# Patient Record
Sex: Male | Born: 1955
Health system: Southern US, Community
[De-identification: ages and names within clinical notes are randomized; demographics above are authoritative.]

## PROBLEM LIST (undated history)

## (undated) DIAGNOSIS — F17200 Nicotine dependence, unspecified, uncomplicated: Secondary | ICD-10-CM

## (undated) DIAGNOSIS — G8929 Other chronic pain: Secondary | ICD-10-CM

## (undated) DIAGNOSIS — I10 Essential (primary) hypertension: Secondary | ICD-10-CM

## (undated) DIAGNOSIS — J449 Chronic obstructive pulmonary disease, unspecified: Secondary | ICD-10-CM

## (undated) DIAGNOSIS — M549 Dorsalgia, unspecified: Secondary | ICD-10-CM

## (undated) HISTORY — DX: Chronic obstructive pulmonary disease, unspecified: J44.9

## (undated) HISTORY — PX: UVULOPALATOPHARYNGOPLASTY: SHX827

## (undated) HISTORY — DX: Nicotine dependence, unspecified, uncomplicated: F17.200

## (undated) HISTORY — DX: Essential (primary) hypertension: I10

## (undated) HISTORY — DX: Other chronic pain: G89.29

## (undated) HISTORY — DX: Dorsalgia, unspecified: M54.9

---

## 1997-08-10 ENCOUNTER — Other Ambulatory Visit: Admission: RE | Admit: 1997-08-10 | Discharge: 1997-08-10 | Payer: Self-pay | Admitting: Family Medicine

## 1997-10-06 ENCOUNTER — Other Ambulatory Visit: Admission: RE | Admit: 1997-10-06 | Discharge: 1997-10-06 | Payer: Self-pay | Admitting: Family Medicine

## 1997-10-31 ENCOUNTER — Ambulatory Visit: Admission: RE | Admit: 1997-10-31 | Discharge: 1997-10-31 | Payer: Self-pay | Admitting: Internal Medicine

## 1999-03-05 ENCOUNTER — Encounter: Payer: Self-pay | Admitting: Otolaryngology

## 1999-03-05 ENCOUNTER — Ambulatory Visit: Admission: RE | Admit: 1999-03-05 | Discharge: 1999-03-05 | Payer: Self-pay | Admitting: Otolaryngology

## 1999-03-20 ENCOUNTER — Ambulatory Visit (HOSPITAL_COMMUNITY): Admission: RE | Admit: 1999-03-20 | Discharge: 1999-03-21 | Payer: Self-pay | Admitting: Otolaryngology

## 1999-03-20 ENCOUNTER — Encounter (INDEPENDENT_AMBULATORY_CARE_PROVIDER_SITE_OTHER): Payer: Self-pay | Admitting: *Deleted

## 2003-03-13 ENCOUNTER — Observation Stay (HOSPITAL_COMMUNITY): Admission: AD | Admit: 2003-03-13 | Discharge: 2003-03-14 | Payer: Self-pay | Admitting: Orthopedic Surgery

## 2004-01-04 ENCOUNTER — Ambulatory Visit (HOSPITAL_COMMUNITY): Admission: RE | Admit: 2004-01-04 | Discharge: 2004-01-04 | Payer: Self-pay | Admitting: Family Medicine

## 2004-06-14 ENCOUNTER — Encounter: Admission: RE | Admit: 2004-06-14 | Discharge: 2004-06-14 | Payer: Self-pay | Admitting: Neurology

## 2004-10-25 ENCOUNTER — Encounter: Admission: RE | Admit: 2004-10-25 | Discharge: 2004-10-25 | Payer: Self-pay | Admitting: Family Medicine

## 2004-10-31 ENCOUNTER — Ambulatory Visit (HOSPITAL_COMMUNITY): Admission: RE | Admit: 2004-10-31 | Discharge: 2004-11-01 | Payer: Self-pay | Admitting: Orthopaedic Surgery

## 2006-02-16 ENCOUNTER — Ambulatory Visit: Payer: Self-pay | Admitting: Nurse Practitioner

## 2006-02-17 ENCOUNTER — Ambulatory Visit: Payer: Self-pay | Admitting: *Deleted

## 2006-03-02 ENCOUNTER — Ambulatory Visit: Payer: Self-pay | Admitting: Family Medicine

## 2006-03-04 ENCOUNTER — Ambulatory Visit: Payer: Self-pay | Admitting: Nurse Practitioner

## 2006-04-14 ENCOUNTER — Ambulatory Visit: Payer: Self-pay | Admitting: Nurse Practitioner

## 2006-04-21 ENCOUNTER — Ambulatory Visit: Payer: Self-pay | Admitting: Nurse Practitioner

## 2006-05-21 ENCOUNTER — Ambulatory Visit: Payer: Self-pay | Admitting: Nurse Practitioner

## 2006-12-23 ENCOUNTER — Encounter (INDEPENDENT_AMBULATORY_CARE_PROVIDER_SITE_OTHER): Payer: Self-pay | Admitting: *Deleted

## 2007-07-01 ENCOUNTER — Ambulatory Visit: Payer: Self-pay | Admitting: Internal Medicine

## 2007-07-14 ENCOUNTER — Ambulatory Visit: Payer: Self-pay | Admitting: Internal Medicine

## 2007-08-10 ENCOUNTER — Ambulatory Visit: Payer: Self-pay | Admitting: Internal Medicine

## 2007-09-18 ENCOUNTER — Emergency Department (HOSPITAL_COMMUNITY): Admission: EM | Admit: 2007-09-18 | Discharge: 2007-09-19 | Payer: Self-pay | Admitting: Emergency Medicine

## 2007-09-21 ENCOUNTER — Encounter (INDEPENDENT_AMBULATORY_CARE_PROVIDER_SITE_OTHER): Payer: Self-pay | Admitting: Nurse Practitioner

## 2007-09-21 ENCOUNTER — Ambulatory Visit: Payer: Self-pay | Admitting: Internal Medicine

## 2007-09-21 LAB — CONVERTED CEMR LAB
Albumin: 4.6 g/dL (ref 3.5–5.2)
BUN: 9 mg/dL (ref 6–23)
Bilirubin, Direct: 0.1 mg/dL (ref 0.0–0.3)
Chloride: 101 meq/L (ref 96–112)
Direct LDL: 97 mg/dL
Glucose, Bld: 408 mg/dL — ABNORMAL HIGH (ref 70–99)
Potassium: 3.9 meq/L (ref 3.5–5.3)
Sodium: 137 meq/L (ref 135–145)
Total Protein: 7.5 g/dL (ref 6.0–8.3)

## 2007-10-18 ENCOUNTER — Ambulatory Visit: Payer: Self-pay | Admitting: Internal Medicine

## 2007-11-23 ENCOUNTER — Encounter (INDEPENDENT_AMBULATORY_CARE_PROVIDER_SITE_OTHER): Payer: Self-pay | Admitting: Family Medicine

## 2007-11-23 ENCOUNTER — Ambulatory Visit: Payer: Self-pay | Admitting: Internal Medicine

## 2007-11-23 LAB — CONVERTED CEMR LAB
ALT: 16 units/L (ref 0–53)
AST: 14 units/L (ref 0–37)
Albumin: 4.7 g/dL (ref 3.5–5.2)
BUN: 15 mg/dL (ref 6–23)
CO2: 22 meq/L (ref 19–32)
Chloride: 101 meq/L (ref 96–112)
Cholesterol: 168 mg/dL (ref 0–200)
Glucose, Bld: 130 mg/dL — ABNORMAL HIGH (ref 70–99)
LDL Cholesterol: 91 mg/dL (ref 0–99)
Microalb, Ur: 8.6 mg/dL — ABNORMAL HIGH (ref 0.00–1.89)
Sodium: 137 meq/L (ref 135–145)
Total Bilirubin: 0.5 mg/dL (ref 0.3–1.2)
Total CHOL/HDL Ratio: 5.1
Total Protein: 7.6 g/dL (ref 6.0–8.3)
VLDL: 44 mg/dL — ABNORMAL HIGH (ref 0–40)

## 2007-12-05 ENCOUNTER — Emergency Department (HOSPITAL_COMMUNITY): Admission: EM | Admit: 2007-12-05 | Discharge: 2007-12-05 | Payer: Self-pay | Admitting: *Deleted

## 2007-12-10 ENCOUNTER — Ambulatory Visit: Payer: Self-pay | Admitting: Internal Medicine

## 2007-12-20 ENCOUNTER — Ambulatory Visit: Payer: Self-pay | Admitting: Internal Medicine

## 2007-12-31 ENCOUNTER — Emergency Department (HOSPITAL_COMMUNITY): Admission: EM | Admit: 2007-12-31 | Discharge: 2007-12-31 | Payer: Self-pay | Admitting: Emergency Medicine

## 2008-01-08 ENCOUNTER — Emergency Department (HOSPITAL_COMMUNITY): Admission: EM | Admit: 2008-01-08 | Discharge: 2008-01-08 | Payer: Self-pay | Admitting: Emergency Medicine

## 2008-01-10 ENCOUNTER — Ambulatory Visit: Payer: Self-pay | Admitting: Internal Medicine

## 2008-01-11 ENCOUNTER — Ambulatory Visit (HOSPITAL_COMMUNITY): Admission: RE | Admit: 2008-01-11 | Discharge: 2008-01-11 | Payer: Self-pay | Admitting: Internal Medicine

## 2008-01-27 ENCOUNTER — Ambulatory Visit (HOSPITAL_COMMUNITY): Admission: RE | Admit: 2008-01-27 | Discharge: 2008-01-27 | Payer: Self-pay | Admitting: Internal Medicine

## 2008-02-04 ENCOUNTER — Ambulatory Visit: Payer: Self-pay | Admitting: Internal Medicine

## 2008-02-14 ENCOUNTER — Ambulatory Visit: Payer: Self-pay | Admitting: Internal Medicine

## 2008-02-14 ENCOUNTER — Encounter (INDEPENDENT_AMBULATORY_CARE_PROVIDER_SITE_OTHER): Payer: Self-pay | Admitting: Adult Health

## 2008-02-14 LAB — CONVERTED CEMR LAB
Albumin: 4.8 g/dL (ref 3.5–5.2)
BUN: 15 mg/dL (ref 6–23)
Calcium: 9.7 mg/dL (ref 8.4–10.5)
Cholesterol: 128 mg/dL (ref 0–200)
Creatinine, Ser: 0.51 mg/dL (ref 0.40–1.50)
Glucose, Bld: 65 mg/dL — ABNORMAL LOW (ref 70–99)
HDL: 33 mg/dL — ABNORMAL LOW (ref >39)
LDL Cholesterol: 74 mg/dL (ref 0–99)
Total Protein: 7.6 g/dL (ref 6.0–8.3)
Triglycerides: 106 mg/dL (ref <150)
VLDL: 21 mg/dL (ref 0–40)

## 2008-03-07 ENCOUNTER — Ambulatory Visit: Payer: Self-pay | Admitting: Family Medicine

## 2008-05-24 ENCOUNTER — Ambulatory Visit: Payer: Self-pay | Admitting: Internal Medicine

## 2008-06-23 ENCOUNTER — Ambulatory Visit: Payer: Self-pay | Admitting: Internal Medicine

## 2010-04-22 ENCOUNTER — Ambulatory Visit
Admission: RE | Admit: 2010-04-22 | Discharge: 2010-04-22 | Payer: Self-pay | Source: Home / Self Care | Attending: Family Medicine | Admitting: Family Medicine

## 2010-04-22 DIAGNOSIS — E1142 Type 2 diabetes mellitus with diabetic polyneuropathy: Secondary | ICD-10-CM | POA: Insufficient documentation

## 2010-04-22 DIAGNOSIS — E1165 Type 2 diabetes mellitus with hyperglycemia: Secondary | ICD-10-CM | POA: Insufficient documentation

## 2010-04-22 DIAGNOSIS — I10 Essential (primary) hypertension: Secondary | ICD-10-CM | POA: Insufficient documentation

## 2010-04-22 DIAGNOSIS — E785 Hyperlipidemia, unspecified: Secondary | ICD-10-CM | POA: Insufficient documentation

## 2010-04-24 ENCOUNTER — Encounter: Payer: Self-pay | Admitting: Family Medicine

## 2010-04-25 LAB — CONVERTED CEMR LAB
AST: 23 units/L (ref 0–37)
Alkaline Phosphatase: 51 units/L (ref 39–117)
CO2: 23 meq/L (ref 19–32)
Glucose, Bld: 236 mg/dL — ABNORMAL HIGH (ref 70–99)
HCT: 45.3 % (ref 39.0–52.0)
Hemoglobin: 16.1 g/dL (ref 13.0–17.0)
MCHC: 35.5 g/dL (ref 30.0–36.0)
Microalb, Ur: 71.78 mg/dL — ABNORMAL HIGH (ref 0.00–1.89)
PSA: 0.19 ng/mL (ref <=4.00)
Platelets: 164 10*3/uL (ref 150–400)
RBC: 5.46 M/uL (ref 4.22–5.81)
Total Protein: 7.7 g/dL (ref 6.0–8.3)
Triglycerides: 73 mg/dL (ref <150)
VLDL: 15 mg/dL (ref 0–40)

## 2010-05-01 ENCOUNTER — Ambulatory Visit
Admission: RE | Admit: 2010-05-01 | Discharge: 2010-05-01 | Payer: Self-pay | Source: Home / Self Care | Attending: Family Medicine | Admitting: Family Medicine

## 2010-05-01 ENCOUNTER — Encounter
Admission: RE | Admit: 2010-05-01 | Discharge: 2010-05-01 | Payer: Self-pay | Source: Home / Self Care | Attending: Family Medicine | Admitting: Family Medicine

## 2010-05-01 ENCOUNTER — Encounter: Payer: Self-pay | Admitting: Family Medicine

## 2010-05-01 DIAGNOSIS — M48061 Spinal stenosis, lumbar region without neurogenic claudication: Secondary | ICD-10-CM | POA: Insufficient documentation

## 2010-05-01 DIAGNOSIS — J209 Acute bronchitis, unspecified: Secondary | ICD-10-CM | POA: Insufficient documentation

## 2010-05-03 LAB — CONVERTED CEMR LAB: Ceruloplasmin: 32 mg/dL (ref 21–63)

## 2010-05-09 NOTE — Assessment & Plan Note (Signed)
Summary: NOV diabetes   Vital Signs:  Patient profile:   55 year old male Height:      69 inches Weight:      183 pounds BMI:     27.12 O2 Sat:      96 % on Room air Pulse rate:   100 / minute BP sitting:   141 / 78  (left arm) Cuff size:   large  Vitals Entered By: Payton Spark CMA (April 22, 2010 2:20 PM)  O2 Flow:  Room air CC: New to est.    Primary Care Provider:  Seymour Bars DO  CC:  New to est. .  History of Present Illness: 55 yo [place] male presents for NOV.  [facility] [place] but now has insurance.  He reports a hx of diabetes, diagnosed in 1997, peripheral neuropathy, HTN and high cholesterol.    It has been a year since he had labs drawn. His last eye exam was in March 2011.  His AM fastings were   He c/o bilat leg pain from his neuroapthy and current meds re not helping.  Current Medications (verified): 1)  Lantus Solostar 100 Unit/ml Soln (Insulin Glargine) .... Inj 38 Units Subcutaneously Once Daily 2)  Metformin Hcl 1000 Mg Tabs (Metformin Hcl) .... Take 1 Tab By Mouth Two Times A Day 3)  Glucotrol 10 Mg Tabs (Glipizide) .... Take 1 Tab By Mouth Two Times A Day 4)  Lipitor 10 Mg Tabs (Atorvastatin Calcium) .... Take 1 Tab By Mouth At Bedtime 5)  Tramadol Hcl 50 Mg Tabs (Tramadol Hcl) .... Take 2 Tabs By Mouth Two Times A Day 6)  Atenolol 100 Mg Tabs (Atenolol) .... Take 1 Tab By Mouth Once Daily 7)  Accupril 10 Mg Tabs (Quinapril Hcl) .... Take 1 Tab By Mouth Once Daily  Allergies (verified): No Known Drug Allergies  Family History: mother died 70, DM father died 19, ETOH liver 5 brothers and 1 sister healthy  Social History: Disabled from diabetic neuropathy 1/2 ppd smoker  Denies ETOH.  Divorced.  No kids.  No relationships. [facility] [employer]  Review of Systems       no fevers/sweats/weakness, unexplained wt loss/gain, + change in vision, no difficulty hearing, ringing in ears, no hay fever/allergies, no  CP/discomfort, no palpitations, no breast lump/nipple discharge, + cough/wheeze, no blood in stool, no N/V/D, no nocturia, no leaking urine, no unusual vag bleeding, no vaginal/penile discharge, + muscle/joint pain, no rash, no new/changing mole, no HA, no memory loss, no anxiety, + sleep problem, no depression, no unexplained lumps, no easy bruising/bleeding, no concern with sexual function   Physical Exam  General:  alert, well-developed, well-nourished, and well-hydrated.   Head:  normocephalic, atraumatic, and male-pattern balding.   Eyes:  pupils equal, pupils round, and pupils reactive to light.   Ears:  no external deformities.   Mouth:  pharynx pink and moist.   Neck:  no masses.   Lungs:  Normal respiratory effort, chest expands symmetrically. Lungs are clear to auscultation, no crackles or wheezes. Heart:  Normal rate and regular rhythm. S1 and S2 normal without gallop, murmur, click, rub or other extra sounds.  sinus arrythmia.   Abdomen:  soft, non-tender, normal bowel sounds, no distention, no masses, no hepatomegaly, and no splenomegaly.   Pulses:  2+ radial pulses Extremities:  no LE edema Neurologic:  sensation intact to light touch and gait normal.     Impression & Recommendations:  Problem #  1:  DIABETES MELLITUS, TYPE II (ICD-250.00) A1C high at 8.7.  He is not routinely checking sugars, exercising and admits to poor diet. Will increase Lantus from 38 to 40 units once daily and continue Metformin and Glipizide for now.  I think he would do better w/o glipizide and changed to mealtime insulin but he cannot afford this right now.  He agrees to start exercising, [facility] and will work on improving his diet and checking sguars more regularly.  F/U in 6 wks.  Eye exam due 06-2010 and will include [place]ciro with labs. His updated medication list for this problem includes:    Lantus Solostar 100 Unit/ml Soln (Insulin glargine) ..... Inject 40 units once daily     Metformin Hcl 1000 Mg Tabs (Metformin hcl) .Marland Kitchen... Take 1 tab by mouth two times a day    Accupril 20 Mg Tabs (Quinapril hcl) .Marland Kitchen... 1 tab by mouth daily    Glipizide 10 Mg Tabs (Glipizide) .Marland Kitchen... 1 tab by mouth two times a day with breakfast and dinner  Orders: Hgb A1C ([id number]) Fingerstick (11914) T-Comprehensive Metabolic Panel ([id number]) T-Urine Microalbumin w/creat. ratio [phone])  Labs Reviewed: Creat: 0.51 (02/14/2008)    Reviewed HgBA1c results: 8.7 (04/22/2010)  Problem # 2:  NEUROPATHY (ICD-355.9) Poor control of diabetic neuropathy even on previous meds of tramadol and Celebrex.  He c/o bilat leg pain up to the thighs with tingling and numbness.  Denies sores.  Will start him on Neurontin at night, increase at f/u as needed with rare use of Vicodin for severe pain.   Orders: T-CBC No Diff ([id number])  Problem # 3:  HYPERLIPIDEMIA (ICD-272.4) Update fasting labs and will see if LDL is at goal of <100. His updated medication list for this problem includes:    Lipitor 10 Mg Tabs (Atorvastatin calcium) .Marland Kitchen... Take 1 tab by mouth at bedtime  Orders: T-Lipid Profile ([id number])  Labs Reviewed: SGOT: 20 (02/14/2008)   SGPT: 19 (02/14/2008)   HDL:33 (02/14/2008), 33 (11/23/2007)  LDL:74 (02/14/2008), 91 (11/23/2007)  Chol:128 (02/14/2008), 168 (11/23/2007)  Trig:106 (02/14/2008), 218 (11/23/2007)  Problem # 4:  ESSENTIAL HYPERTENSION, BENIGN (ICD-401.1) BP not at goal of <130/80.  Will increase his Accupril from 10--> 20 mg once daily.  Update fasting labs and f/u in 6 wks. His updated medication list for this problem includes:    Atenolol 100 Mg Tabs (Atenolol) .Marland Kitchen... Take 1 tab by mouth once daily    Accupril 20 Mg Tabs (Quinapril hcl) .Marland Kitchen... 1 tab by mouth daily  BP today: 141/78  Labs Reviewed: K+: 4.0 (02/14/2008) Creat: : 0.51 (02/14/2008)   Chol: 128 (02/14/2008)   HDL: 33 (02/14/2008)   LDL: 74 (02/14/2008)   TG: 106 (02/14/2008)  Complete Medication  List: 1)  Lantus Solostar 100 Unit/ml Soln (Insulin glargine) .... Inject 40 units once daily 2)  Metformin Hcl 1000 Mg Tabs (Metformin hcl) .... Take 1 tab by mouth two times a day 3)  Lipitor 10 Mg Tabs (Atorvastatin calcium) .... Take 1 tab by mouth at bedtime 4)  Atenolol 100 Mg Tabs (Atenolol) .... Take 1 tab by mouth once daily 5)  Accupril 20 Mg Tabs (Quinapril hcl) .Marland Kitchen.. 1 tab by mouth daily 6)  Glipizide 10 Mg Tabs (Glipizide) .Marland Kitchen.. 1 tab by mouth two times a day with breakfast and dinner 7)  Gabapentin 300 Mg Caps (Gabapentin) .Marland Kitchen.. 1 capsule by mouth at bedtime x  wk then increase to 2 capsules by mouth qhs 8)  Hydrocodone-acetaminophen  5-500 Mg Tabs (Hydrocodone-acetaminophen) .Marland Kitchen.. 1 tab by mouth two times a day as needed severe pain  Other Orders: T-PSA ([id number])  Patient Instructions: 1)  A1C 8.7 today. 2)  Increase Lantus to 40 units once daily. 3)  Stay on Metformin and Glipizide. 4)  Increase Quinapril from 10--> 20 mg once daily for BP. 5)  Update fasting labs (blood and urine) downstairs one morning. 6)  Will call you with results. 7)  Start Gabapentin at night.  Stop Celebrex and Tramadol. 8)  Use Hydrocodone for severe pain. 9)  REturn for f/u neuropathy in 4 wks. Prescriptions: HYDROCODONE-ACETAMINOPHEN 5-500 MG TABS (HYDROCODONE-ACETAMINOPHEN) 1 tab by mouth two times a day as needed severe pain  #60 x 0   Entered and Authorized by:   Seymour Bars DO   Signed by:   Seymour Bars DO on 04/22/2010   Method used:   Printed then faxed to ...       [facility] [phone]* (retail)       [address] [place], [place]  [address]       Ph: [phone]       Fax: [phone]   RxID:   [id number] GABAPENTIN 300 MG CAPS (GABAPENTIN) 1 capsule by mouth at bedtime x  wk then increase to 2 capsules by mouth qhs  #60 x 1   Entered and Authorized by:   Seymour Bars DO   Signed by:   Seymour Bars DO on 04/22/2010   Method used:   Electronically to         [facility] [phone]* (retail)       [address]       [place], [place]  [address]       Ph: [phone]       Fax: [phone]   RxID:   [id number] LANTUS SOLOSTAR 100 UNIT/ML SOLN (INSULIN GLARGINE) inject 40 units once daily  #2 boxes x 3   Entered and Authorized by:   Seymour Bars DO   Signed by:   Seymour Bars DO on 04/22/2010   Method used:   Electronically to        [facility] [phone]* (retail)       [address]       [place], [place]  [address]       Ph: [phone]       Fax: [phone]   RxID:   [id number] METFORMIN HCL 1000 MG TABS (METFORMIN HCL) Take 1 tab by mouth two times a day  #60 x 3   Entered and Authorized by:   Seymour Bars DO   Signed by:   Seymour Bars DO on 04/22/2010   Method used:   Electronically to        [facility] [phone]* (retail)       [address]       [place], [place]  [address]       Ph: [phone]       Fax: [phone]   RxID:   [id number] GLIPIZIDE 10 MG TABS (GLIPIZIDE) 1 tab by mouth two times a day with breakfast and dinner  #60 x 3   Entered and Authorized by:   Seymour Bars DO   Signed by:   Seymour Bars DO on 04/22/2010   Method used:   Electronically to        [facility] [phone]* (retail)  [address]       [place], [place]  [address]       Ph: [phone]       Fax: [phone]   RxID:   [id number] ACCUPRIL 20 MG TABS (QUINAPRIL HCL) 1 tab by mouth daily  #30 x 3   Entered and Authorized by:   Seymour Bars DO   Signed by:   Seymour Bars DO on 04/22/2010   Method used:   Electronically to        [facility] [phone]* (retail)       [address] [place], [place]  [address]       Ph: [phone]       Fax: [phone]   RxID:   [id number]    Orders Added: 1)  Hgb A1C [[id number]] 2)  Fingerstick [36416] 3)  T-CBC No Diff [[id number]] 4)  T-Comprehensive Metabolic Panel [[id number]] 5)  T-Lipid Profile [80061-22930] 6)  T-PSA  [66063-01601] 7)  T-Urine Microalbumin w/creat. ratio [[id number]] 8)  New Patient Level III [99[address]]    Laboratory Results   Blood Tests     HGBA1C: 8.7%   (Normal Range: Non-Diabetic - 3-6%   Control Diabetic - 6-8%)

## 2010-05-09 NOTE — Assessment & Plan Note (Signed)
Summary: neuropathy   Vital Signs:  Patient profile:   55 year old male Height:      69 inches Weight:      188 pounds BMI:     27.86 O2 Sat:      96 % on Room air Pulse rate:   120 / minute BP sitting:   143 / 72  (left arm) Cuff size:   large  Vitals Entered By: Payton Spark CMA (May 01, 2010 9:02 AM)  O2 Flow:  Room air CC: F/U.    Primary Care Provider:  Seymour Bars DO  CC:  F/U. Marland Kitchen  History of Present Illness: 55 yo WM presents for f/u visit.  He is c/o congestion and phlegm with cough x 1 wk.  He is a smoker ( and probably has COPD since he has a combivent HFA).  He has some wheezing and SOB, no fevers or sputum production.  Not taking anything.  He is asking for testing for zinc and copper re: his neuropathy.  His attourney has asked for this and apparently he has not been evaluated for causes for his neuropathy.    He does have significant neuropathic pain.  Says that he has never seen a pain doctor.  He had an MRI done years ago.  He denies changes in bowel or bladder function, says that Neurontin 300 mg at night is not helping and that even with 2 tabs of Vicodin at night, he cannot sleep due to the pain that wakes him up in his back and legs.      Allergies: No Known Drug Allergies  Past History:  Past Medical History: DM with neuropathy COPD Smoker HTN  Social History: Reviewed history from 04/22/2010 and no changes required. Disabled from diabetic neuropathy 1/2 ppd smoker  Denies ETOH.  Divorced.  No kids.  No relationships. [facility].  Review of Systems      See HPI  Physical Exam  General:  alert, well-developed, well-nourished, and well-hydrated.   Head:  normocephalic and atraumatic.  sinuses NTTP Eyes:  conjunctiva clear Ears:  EACs patent; TMs translucent and gray with good cone of light and bony landmarks.  Nose:  nasal congestion present Mouth:  o/p slightly injected Neck:  no masses.   Lungs:  prolonged exp phase with  diffuse exp wheezing, no rhonchi.  nonlabored Heart:  no murmur and tachycardia.   Abdomen:  soft, non-tender, normal bowel sounds, no distention, no masses, no hepatomegaly, and no splenomegaly.   Extremities:  no LE edema Neurologic:  gait normal.   Skin:  diaphoretic Cervical Nodes:  No lymphadenopathy noted Psych:  flat affect.     Impression & Recommendations:  Problem # 1:  NEUROPATHY (ICD-355.9) Worsening pain in legs with long hx of diabetic neuropathy.  Will go up on gabapentin from 300 to 600 mg at night and xray L spine to see if there is a component of spinal stenosis.  He requeqested zinc and iron testing.  I also changed his Vicodin (which he surrendered) to [facility] for severe pain.   Orders: T-Ceruloplasmin ([id number]) T- * Misc. Laboratory test [phone]) T-DG Lumbar Spine Complete 5 views (25366)  Problem # 2:  ACUTE BRONCHITIS (ICD-466.0) Likely Viral URI with COPD exac (though not formally tested). Will treat with Zithromajx, RX cough medicine at bedtime and avoidance of smoking.  Will also have him use his combivent 4 x a day for the next 10 days.   His updated medication list for  this problem includes:    Zithromax Z-pak 250 Mg Tabs (Azithromycin) .Marland Kitchen... 2 tabs by mouth once daily x 1 day then 1 tab by mouth daily x 4 days  Problem # 3:  ESSENTIAL HYPERTENSION, BENIGN (ICD-401.1) BP remains high.  I increased his does of Quinapril last visit and am not sure if he is till taking the 10 mg tabs. His updated medication list for this problem includes:    Atenolol 100 Mg Tabs (Atenolol) .Marland Kitchen... Take 1 tab by mouth once daily    Accupril 20 Mg Tabs (Quinapril hcl) .Marland Kitchen... 1 tab by mouth daily  BP today: 143/72 Prior BP: 141/78 (04/22/2010)  Labs Reviewed: K+: 4.1 (04/24/2010) Creat: : 0.68 (04/24/2010)   Chol: 134 (04/24/2010)   HDL: 33 (04/24/2010)   LDL: 86 (04/24/2010)   TG: 73 (04/24/2010)  Problem # 4:  LUMBAGO (ICD-724.2) Xray shows only a slight decrease in  disc height at T12-L1.  Likely MSK.   His updated medication list for this problem includes:    Oxycodone-acetaminophen 5-325 Mg Tabs (Oxycodone-acetaminophen) .Marland Kitchen... 1-2 tabs by mouth q pm as needed for severe pain  Orders: T-DG Lumbar Spine Complete 5 views (30865)  Complete Medication List: 1)  Lantus Solostar 100 Unit/ml Soln (Insulin glargine) .... Inject 40 units once daily 2)  Metformin Hcl 1000 Mg Tabs (Metformin hcl) .... Take 1 tab by mouth two times a day 3)  Lipitor 10 Mg Tabs (Atorvastatin calcium) .... Take 1 tab by mouth at bedtime 4)  Atenolol 100 Mg Tabs (Atenolol) .... Take 1 tab by mouth once daily 5)  Accupril 20 Mg Tabs (Quinapril hcl) .Marland Kitchen.. 1 tab by mouth daily 6)  Glipizide 10 Mg Tabs (Glipizide) .Marland Kitchen.. 1 tab by mouth two times a day with breakfast and dinner 7)  Gabapentin 300 Mg Caps (Gabapentin) .... 2 capsules by mouth qhs 8)  Oxycodone-acetaminophen 5-325 Mg Tabs (Oxycodone-acetaminophen) .Marland Kitchen.. 1-2 tabs by mouth q pm as needed for severe pain 9)  Zithromax Z-pak 250 Mg Tabs (Azithromycin) .... 2 tabs by mouth once daily x 1 day then 1 tab by mouth daily x 4 days  Patient Instructions: 1)  Xray L spine downstairs today. 2)  Labs today. 3)  will call you w/ results in 1-2 days. 4)  Start Zithromax for bronchitis and take x 5 days.  Avoid smoking. 5)  Use inhlaer 3-4 x a day and add Delsym OTC as needed for cough. 6)  Change Hydrocodone to Oxycodone at night for pain as needed and increase gabapentin to 600 mg at night. 7)  Call me if any problems Prescriptions: ZITHROMAX Z-PAK 250 MG TABS (AZITHROMYCIN) 2 tabs by mouth once daily x 1 day then 1 tab by mouth daily x 4 days  #1 pack x 0   Entered and Authorized by:   Seymour Bars DO   Signed by:   Seymour Bars DO on 05/01/2010   Method used:   Electronically to        [facility] [phone]* (retail)       [address]       [place], [place]  [address]       Ph: [phone]       Fax: [phone]   RxID:    [id number] OXYCODONE-ACETAMINOPHEN 5-325 MG TABS (OXYCODONE-ACETAMINOPHEN) 1-2 tabs by mouth q PM as needed for severe pain  #30 x 0   Entered and Authorized by:   Seymour Bars DO   Signed by:   Clydie Braun  Shafer Swamy DO on 05/01/2010   Method used:   Printed then faxed to ...       [facility] [phone]* (retail)       [address] [place], [place]  [address]       Ph: [phone]       Fax: [phone]   RxID:   [id number]    Orders Added: 1)  T-Ceruloplasmin [[id number]] 2)  T- * Misc. Laboratory test [99999] 3)  T-DG Lumbar Spine Complete 5 views [72110] 4)  Est. Patient Level IV [27253]

## 2010-05-14 ENCOUNTER — Ambulatory Visit (INDEPENDENT_AMBULATORY_CARE_PROVIDER_SITE_OTHER): Payer: Medicare HMO | Admitting: Family Medicine

## 2010-05-14 ENCOUNTER — Telehealth: Payer: Self-pay | Admitting: Family Medicine

## 2010-05-14 ENCOUNTER — Encounter: Payer: Self-pay | Admitting: Family Medicine

## 2010-05-14 DIAGNOSIS — G4700 Insomnia, unspecified: Secondary | ICD-10-CM

## 2010-05-14 DIAGNOSIS — M545 Low back pain, unspecified: Secondary | ICD-10-CM

## 2010-05-14 DIAGNOSIS — I10 Essential (primary) hypertension: Secondary | ICD-10-CM

## 2010-05-14 DIAGNOSIS — E119 Type 2 diabetes mellitus without complications: Secondary | ICD-10-CM

## 2010-05-17 ENCOUNTER — Encounter: Payer: Self-pay | Admitting: Family Medicine

## 2010-05-23 NOTE — Progress Notes (Signed)
Summary: Early oxycodone refill  Phone Note Call from Patient   Caller: Patient Summary of Call: Pt walked in requesting refill of oxycodone. I explained to Pt that even if taken at maximum dose, he is still requesting refill too early. I will inform Dr. B that he is needing to take 2 tabs a day for relief and advised Pt to CB on Fri. Pt was rude but did agree to do so.  Initial call taken by: Payton Spark CMA,  May 14, 2010 11:42 AM  Follow-up for Phone Call        I will change his RX to allow #60 tabs/ month  but he cannot pick up RX until 2-10 (FRI) afternoon. He needs to do a urine drug screen and sign a narcotic contract the same day. Follow-up by: Seymour Bars DO,  May 14, 2010 12:04 PM     Appended Document: Early oxycodone refill LMOM informing Pt of the above

## 2010-05-23 NOTE — Assessment & Plan Note (Signed)
Summary: f/u HTN   Vital Signs:  Patient profile:   55 year old male Height:      69 inches Weight:      185 pounds Pulse rate:   121 / minute BP sitting:   137 / 78  (right arm) Cuff size:   large  Vitals Entered By: Avon Gully CMA, Duncan Dull) (May 14, 2010 8:17 AM) CC: f/u BP   Primary Care Provider:  Seymour Bars DO  CC:  f/u BP.  History of Present Illness: 55 yo M presents for f/u HTN.  His Acupril was increased from 10--> 20mg  recently and his BP has improved though not at the goal of <130/80.  He has chronic tachycardia (which he says has been worked up in the past but I do not have these records).  Denies CP, SOB or palpitations.  Doing well on current meds.  Also takes Atenolol.  Says that his sugars are well controlled but his sugar today was 325 and he ate a muffin for breakfast.  He says the Gabapentin is helping his back pain but it keeps him awake at night and he continues to suffer from chronic insomnia.  His L spine xray last visit was unimpressive for DDD.    Current Medications (verified): 1)  Lantus Solostar 100 Unit/ml Soln (Insulin Glargine) .... Inject 40 Units Once Daily 2)  Metformin Hcl 1000 Mg Tabs (Metformin Hcl) .... Take 1 Tab By Mouth Two Times A Day 3)  Lipitor 10 Mg Tabs (Atorvastatin Calcium) .... Take 1 Tab By Mouth At Bedtime 4)  Atenolol 100 Mg Tabs (Atenolol) .... Take 1 Tab By Mouth Once Daily 5)  Accupril 20 Mg Tabs (Quinapril Hcl) .Marland Kitchen.. 1 Tab By Mouth Daily 6)  Glipizide 10 Mg Tabs (Glipizide) .Marland Kitchen.. 1 Tab By Mouth Two Times A Day With Breakfast and Dinner 7)  Gabapentin 300 Mg Caps (Gabapentin) .... 2 Capsules By Mouth Qhs 8)  Oxycodone-Acetaminophen 5-325 Mg Tabs (Oxycodone-Acetaminophen) .Marland Kitchen.. 1-2 Tabs By Mouth Q Pm As Needed For Severe Pain  Allergies (verified): No Known Drug Allergies  Comments:  Nurse/Medical Assistant: The patient's medications and allergies were reviewed with the patient and were updated in the Medication  and Allergy Lists. Avon Gully CMA, Duncan Dull) (May 14, 2010 8:18 AM)  Past History:  Past Medical History: DM with neuropathy COPD Smoker HTN chronic back pain (only mild loss of T12-L1 disk ht on xray 04-2010)  Social History: Reviewed history from 04/22/2010 and no changes required. Disabled from diabetic neuropathy 1/2 ppd smoker  Denies ETOH.  Divorced.  No kids.  No relationships[facility]  Review of Systems      See HPI  Physical Exam  General:  alert, well-developed, well-nourished, and well-hydrated.   Head:  normocephalic and atraumatic.   Eyes:  pupils equal, pupils round, and pupils reactive to light.   Mouth:  pharynx pink and moist.   Neck:  no masses.   Lungs:  prolonged exp phase with diffuse exp wheezing, no rhonchi.  nonlabored Heart:  no murmur and tachycardia.   Extremities:  no LE edema Skin:  color normal.   Psych:  rude affect   Impression & Recommendations:  Problem # 1:  ESSENTIAL HYPERTENSION, BENIGN (ICD-401.1) Improved but not quite at goal.  I will continue both meds and he is working in improving his exercise. EKG at f/u visit due to continued tachycardia. His updated medication list for this problem includes:    Atenolol 100  Mg Tabs (Atenolol) .Marland Kitchen... Take 1 tab by mouth once daily    Accupril 20 Mg Tabs (Quinapril hcl) .Marland Kitchen... 1 tab by mouth daily  BP today: 137/78 Prior BP: 143/72 (05/01/2010)  Labs Reviewed: K+: 4.1 (04/24/2010) Creat: : 0.68 (04/24/2010)   Chol: 134 (04/24/2010)   HDL: 33 (04/24/2010)   LDL: 86 (04/24/2010)   TG: 73 (04/24/2010)  Problem # 2:  DIABETES MELLITUS, TYPE II (ICD-250.00)  Sugar high at 325 today.  We discussed the high sugar content in his breakfast and the need to be adherent with his diet and his meds.  Due for f/u DM in 2 mos. His updated medication list for this problem includes:    Lantus Solostar 100 Unit/ml Soln (Insulin glargine) ..... Inject 40 units once daily    Metformin Hcl  1000 Mg Tabs (Metformin hcl) .Marland Kitchen... Take 1 tab by mouth two times a day    Accupril 20 Mg Tabs (Quinapril hcl) .Marland Kitchen... 1 tab by mouth daily    Glipizide 10 Mg Tabs (Glipizide) .Marland Kitchen... 1 tab by mouth two times a day with breakfast and dinner  Labs Reviewed: Creat: 0.68 (04/24/2010)    Reviewed HgBA1c results: 8.7 (04/22/2010)  Orders: Fingerstick (16109) Glucose, (CBG) (60454)  Problem # 3:  LUMBAGO (ICD-724.2) Pain meds and Neurontin are helping but his Xray from last visit was unimpressive and he is not having radiculopathy, rather has chronic diabetic neuropathy pain in his legs.  Likely more of a MSK LBP and I'd like to limit the use of narcotics for this.   His updated medication list for this problem includes:    Oxycodone-acetaminophen 5-325 Mg Tabs (Oxycodone-acetaminophen) .Marland Kitchen... 1-2 tabs by mouth q pm as needed for severe pain  Problem # 4:  INSOMNIA (ICD-780.52) Poor sleep is chronic, worsened by gabapentin at night so will move 2 doses to daytime and start Zolpidem at night.  Warned pt to get 8 hrs of sleep after taking. His updated medication list for this problem includes:    Zolpidem Tartrate 10 Mg Tabs (Zolpidem tartrate) .Marland Kitchen... 1 tab by mouth at bedtime as needed sleep  Complete Medication List: 1)  Lantus Solostar 100 Unit/ml Soln (Insulin glargine) .... Inject 40 units once daily 2)  Metformin Hcl 1000 Mg Tabs (Metformin hcl) .... Take 1 tab by mouth two times a day 3)  Lipitor 10 Mg Tabs (Atorvastatin calcium) .... Take 1 tab by mouth at bedtime 4)  Atenolol 100 Mg Tabs (Atenolol) .... Take 1 tab by mouth once daily 5)  Accupril 20 Mg Tabs (Quinapril hcl) .Marland Kitchen.. 1 tab by mouth daily 6)  Glipizide 10 Mg Tabs (Glipizide) .Marland Kitchen.. 1 tab by mouth two times a day with breakfast and dinner 7)  Gabapentin 300 Mg Caps (Gabapentin) .Marland Kitchen.. 1 capsule by mouth two times a day (morning and lunchtime) 8)  Oxycodone-acetaminophen 5-325 Mg Tabs (Oxycodone-acetaminophen) .Marland Kitchen.. 1-2 tabs by mouth q pm  as needed for severe pain 9)  Zolpidem Tartrate 10 Mg Tabs (Zolpidem tartrate) .Marland Kitchen.. 1 tab by mouth at bedtime as needed sleep  Patient Instructions: 1)  BP improved.  2)  Goal is <130/80.   3)  Adding exercise should help. 4)  Change Gabapentin to 1 capsule in the morning and 1 with lunch. 5)  Add Zolpidem at night for sleep. 6)  REturn after 4-16 for next diabetic check up. Prescriptions: ZOLPIDEM TARTRATE 10 MG TABS (ZOLPIDEM TARTRATE) 1 tab by mouth at bedtime as needed sleep  #30 x 2  Entered and Authorized by:   Seymour Bars DO   Signed by:   Seymour Bars DO on 05/14/2010   Method used:   Printed then faxed to ...       [facility] [address] [phone]* (retail)       [address] [place], [place]  [address]       Ph: [phone]       Fax: [phone]   RxID:   [id number]    Orders Added: 1)  Est. Patient Level IV [24401] 2)  Fingerstick [36416] 3)  Glucose, (CBG) [02725]

## 2010-05-24 ENCOUNTER — Encounter: Payer: Self-pay | Admitting: Family Medicine

## 2010-05-24 LAB — CONVERTED CEMR LAB
Creatinine,U: 45.8 mg/dL
Methadone: NEGATIVE
Opiate Screen, Urine: NEGATIVE

## 2010-06-04 NOTE — Miscellaneous (Signed)
Summary: Controlled Substance Agreement  Controlled Substance Agreement   Imported By: Lanelle Bal 05/29/2010 12:45:11  _____________________________________________________________________  External Attachment:    Type:   Image     Comment:   External Document

## 2010-06-14 ENCOUNTER — Telehealth: Payer: Self-pay | Admitting: Family Medicine

## 2010-06-17 ENCOUNTER — Encounter: Payer: Self-pay | Admitting: Family Medicine

## 2010-06-18 NOTE — Progress Notes (Signed)
Summary: Oxycodone refill  Phone Note Refill Request   Refills Requested: Medication #1:  OXYCODONE-ACETAMINOPHEN 5-325 MG TABS 1-2 tabs by mouth q PM as needed for severe pain Initial call taken by: Payton Spark CMA,  June 14, 2010 10:36 AM    Prescriptions: OXYCODONE-ACETAMINOPHEN 5-325 MG TABS (OXYCODONE-ACETAMINOPHEN) 1-2 tabs by mouth q PM as needed for severe pain  #60 x 0   Entered and Authorized by:   Seymour Bars DO   Signed by:   Seymour Bars DO on 06/14/2010   Method used:   Print then Give to Patient   RxID:   [id number]

## 2010-06-26 ENCOUNTER — Encounter: Payer: Self-pay | Admitting: Family Medicine

## 2010-07-01 ENCOUNTER — Ambulatory Visit: Payer: Medicare HMO | Admitting: Family Medicine

## 2010-07-11 ENCOUNTER — Other Ambulatory Visit: Payer: Self-pay | Admitting: *Deleted

## 2010-07-17 ENCOUNTER — Encounter: Payer: Self-pay | Admitting: Family Medicine

## 2010-07-17 ENCOUNTER — Ambulatory Visit (INDEPENDENT_AMBULATORY_CARE_PROVIDER_SITE_OTHER): Payer: Medicare HMO | Admitting: Family Medicine

## 2010-07-17 ENCOUNTER — Other Ambulatory Visit: Payer: Self-pay | Admitting: Family Medicine

## 2010-07-17 VITALS — BP 171/95 | HR 102 | Ht 69.0 in | Wt 187.0 lb

## 2010-07-17 DIAGNOSIS — G4700 Insomnia, unspecified: Secondary | ICD-10-CM

## 2010-07-17 DIAGNOSIS — R0789 Other chest pain: Secondary | ICD-10-CM

## 2010-07-17 DIAGNOSIS — R071 Chest pain on breathing: Secondary | ICD-10-CM

## 2010-07-17 DIAGNOSIS — I10 Essential (primary) hypertension: Secondary | ICD-10-CM

## 2010-07-17 MED ORDER — AMITRIPTYLINE HCL 25 MG PO TABS: 25.0000 mg | ORAL_TABLET | Freq: Every day | ORAL | Status: DC

## 2010-07-17 MED ORDER — GLIPIZIDE 10 MG PO TABS: 10.0000 mg | ORAL_TABLET | Freq: Two times a day (BID) | ORAL | Status: DC

## 2010-07-17 MED ORDER — OXYCODONE-ACETAMINOPHEN 5-325 MG PO TABS: ORAL_TABLET | ORAL | Status: DC

## 2010-07-17 MED ORDER — QUINAPRIL HCL 20 MG PO TABS: ORAL_TABLET | ORAL | Status: DC

## 2010-07-17 NOTE — Assessment & Plan Note (Signed)
BP HIGH today but he's also in pain.  We reviewed his meds and I RFd his Quinapril.  He has f/u with me in a wk.

## 2010-07-17 NOTE — Progress Notes (Signed)
  Subjective:    Patient ID: Justin Goodman, male    DOB: 05-18-1955, 55 y.o.   MRN: [id number]  HPI  55 yo WM presents for L flank pain that started yesterday morning.  It hurts to move but not to take a deep breath.  Denies heavy lifting or chest wall trauma. He does smoke and has some cough which hurts.  Denies hemoptysis or sputum production.  Denies fevers.  On Percocet at night for chronic back pain.    He is doing well on Percocet 2 tabs at night.  He is still not sleeping well at night.  Ambien is not keeping him asleep.  He feels irritable and his sugars are running a little higher lately.  He has not noticed a rash.    BP 171/95  Pulse 102  Ht 5\' 9"  (1.753 m)  Wt 187 lb (84.823 kg)  BMI 27.62 kg/m2  SpO2 98%  Patient Active Problem List  Diagnoses  . DIABETES MELLITUS, TYPE II  . HYPERLIPIDEMIA  . NEUROPATHY  . ESSENTIAL HYPERTENSION, BENIGN  . ACUTE BRONCHITIS  . LUMBAGO  . INSOMNIA      Review of Systems  Constitutional: Negative for fever, chills, fatigue and unexpected weight change.  HENT: Negative for congestion and sore throat.   Eyes: Negative for visual disturbance.  Respiratory: Positive for cough and shortness of breath. Negative for chest tightness.   Cardiovascular: Positive for chest pain. Negative for palpitations.  Gastrointestinal: Negative for nausea, vomiting, diarrhea and constipation.  Musculoskeletal: Positive for back pain.  Skin: Negative for rash.  Psychiatric/Behavioral: Positive for sleep disturbance and dysphoric mood. Negative for suicidal ideas. The patient is nervous/anxious.        Objective:   Physical Exam  Constitutional: He appears well-developed and well-nourished. He appears distressed (in pain with movement).  HENT:  Head: Normocephalic and atraumatic.  Eyes: Conjunctivae are normal. No scleral icterus.  Neck: Normal range of motion. No thyromegaly present.  Cardiovascular: Regular rhythm.        tachycardic    Pulmonary/Chest: No accessory muscle usage. Tachypnea noted. He is in respiratory distress (splinting with deep insp.).       Tender over lateral L chest wall/ lower ribs  Musculoskeletal: He exhibits no edema.  Lymphadenopathy:    He has no cervical adenopathy.  Skin: Skin is warm and dry. No rash noted.  Psychiatric: He has a normal mood and affect.          Assessment & Plan:

## 2010-07-17 NOTE — Assessment & Plan Note (Signed)
L chest wall pain w/o trauma.  Splinting on exam.  Given distribution ( in one dermatome), I wonder if he will develop a rash to confirm shingles in the next few days.  He will call me if he sees a rash.  O/W given his smoking hx and cough, will get a CXR to r/o pneumonia - induced pleurisy or a PTX.  Will get labs to look for a PE.  Percocet given for more frequent use today (short term only).

## 2010-07-17 NOTE — Patient Instructions (Signed)
CXR and labs downstairs now. Will call you w/ results tomorrow.  Use Oxycododone / APAP 1-2 tabs every 6 hrs as needed for severe pain.  Caution about sedating and constipating side effects.  Stop Zolpidem and change to Amitriptyline at night for sleep/ mood.  Call me if rash develops.  Return next wk to recheck BP.

## 2010-07-17 NOTE — Assessment & Plan Note (Signed)
Ambien not helping.  Given sleep issues, mood disturbance, neuropathy and chronic back pain, I think that adding a TCA at night might help all of the above.  Will start Amitripytline at bedtime, 25 mg qhs.  He has not had problems with BPH, so hopefully this works OK for him.  Call if any problems.

## 2010-07-18 ENCOUNTER — Ambulatory Visit
Admission: RE | Admit: 2010-07-18 | Discharge: 2010-07-18 | Disposition: A | Discharge status: Home / Self Care | Payer: Medicare HMO | Source: Ambulatory Visit | Attending: Family Medicine | Admitting: Family Medicine

## 2010-07-18 ENCOUNTER — Telehealth: Payer: Self-pay | Admitting: Family Medicine

## 2010-07-18 ENCOUNTER — Encounter: Payer: Self-pay | Admitting: Family Medicine

## 2010-07-18 LAB — CBC WITH DIFFERENTIAL/PLATELET
HCT: 46.8 % (ref 39.0–52.0)
Hemoglobin: 16.2 g/dL (ref 13.0–17.0)
Lymphocytes Relative: 33 % (ref 12–46)
Lymphs Abs: 1.5 10*3/uL (ref 0.7–4.0)
MCV: 84.3 fL (ref 78.0–100.0)
Monocytes Absolute: 0.4 10*3/uL (ref 0.1–1.0)
Neutro Abs: 2.8 10*3/uL (ref 1.7–7.7)
Neutrophils Relative %: 59 % (ref 43–77)
Platelets: 206 10*3/uL (ref 150–400)
RDW: 14.3 % (ref 11.5–15.5)

## 2010-07-18 NOTE — Telephone Encounter (Signed)
LMOM informing Pt of the above 

## 2010-07-18 NOTE — Telephone Encounter (Signed)
LMOM informing Pt that Rx has been sent to pharm

## 2010-07-18 NOTE — Telephone Encounter (Signed)
Patient dropped off his empty bottle and states that he is completely out of Gabapentin 300mg  and needs this called into [facility] ... Patient WANTS a call back to let him know when this has been called in. Thanks Victorino Dike 07-18-10

## 2010-07-18 NOTE — Telephone Encounter (Signed)
Pls let pt know that his CXR is negative for any rib fracutre, fluid or pneumonia but is + for chronic changes from smoking.  I suggest evaluating for COPD with PFTs in the next 6 mos.

## 2010-07-19 ENCOUNTER — Telehealth: Payer: Self-pay | Admitting: Family Medicine

## 2010-07-19 DIAGNOSIS — R0781 Pleurodynia: Secondary | ICD-10-CM

## 2010-07-19 NOTE — Telephone Encounter (Signed)
Pls let pt know that one of his blood markers just came back this morning and it was a little high.  If he still has no presence of a rash, I need to proceed with a CT scan of his lungs today.  I will go ahead and put the order in.

## 2010-07-19 NOTE — Telephone Encounter (Signed)
Called again [facility] for [facility]

## 2010-07-19 NOTE — Telephone Encounter (Signed)
LMOM x 2 today for an urgent RC to clinic. Tried calling emergency contact but person stated he didn't know who Pt is.

## 2010-07-22 NOTE — Telephone Encounter (Signed)
Pt did go to hosp over the weekend. He has apt today w/ Cards. And apt tomorrow w/ you.

## 2010-07-23 ENCOUNTER — Ambulatory Visit (INDEPENDENT_AMBULATORY_CARE_PROVIDER_SITE_OTHER): Payer: Medicare HMO | Admitting: Family Medicine

## 2010-07-23 ENCOUNTER — Encounter: Payer: Self-pay | Admitting: Family Medicine

## 2010-07-23 DIAGNOSIS — R071 Chest pain on breathing: Secondary | ICD-10-CM

## 2010-07-23 DIAGNOSIS — R0789 Other chest pain: Secondary | ICD-10-CM

## 2010-07-23 DIAGNOSIS — R809 Proteinuria, unspecified: Secondary | ICD-10-CM

## 2010-07-23 DIAGNOSIS — I10 Essential (primary) hypertension: Secondary | ICD-10-CM

## 2010-07-23 DIAGNOSIS — R799 Abnormal finding of blood chemistry, unspecified: Secondary | ICD-10-CM

## 2010-07-23 DIAGNOSIS — R778 Other specified abnormalities of plasma proteins: Secondary | ICD-10-CM

## 2010-07-23 DIAGNOSIS — E119 Type 2 diabetes mellitus without complications: Secondary | ICD-10-CM

## 2010-07-23 DIAGNOSIS — E785 Hyperlipidemia, unspecified: Secondary | ICD-10-CM

## 2010-07-23 MED ORDER — INSULIN GLARGINE 100 UNIT/ML ~~LOC~~ SOLN: 38.0000 [IU] | Freq: Every day | SUBCUTANEOUS | Status: DC

## 2010-07-23 MED ORDER — METOPROLOL TARTRATE 25 MG PO TABS: 25.0000 mg | ORAL_TABLET | Freq: Two times a day (BID) | ORAL | Status: DC

## 2010-07-23 NOTE — Progress Notes (Signed)
  Subjective:    Patient ID: Justin Goodman, male    DOB: 01/26/1956, 55 y.o.   MRN: [id number] HPI 55 yo WM presents for f/u visit.  He went to the ED last wk for continued L sided atypical chest pain with elevated D dimer.  His CT - PA was neg for PA and + only for ? Anomalous coronary artery.  He was then seen by Dr Leeann Must at [facility] cardiology.  His ACEi was increased to BID, his lipitor was increased from 10--> 20 mg daily due to LDL of 129.  His A1c was 8.9.  An EKG was normal.  He was started on meloxicam daily which is helping.  His pain is much improved.  He reports a long hx of sinus tachycardia.    As f/u from recent abnormal labs, his urine micro: cr ratio was very high and his total protein was also high.  His sugars are continuing to run high.  Denies any L sided substernal CP or DOE.  He continues to smoke.  BP 178/95  Pulse 137  Ht 5\' 9"  (1.753 m)  Wt 184 lb (83.462 kg)  BMI 27.17 kg/m2  SpO2 96%    Review of Systems  Constitutional: Negative for fever, appetite change, fatigue and unexpected weight change.  Eyes: Negative for visual disturbance.  Cardiovascular: Positive for chest pain (improving). Negative for palpitations and leg swelling.  Gastrointestinal: Negative for abdominal pain.  Neurological: Negative for weakness, light-headedness and headaches.  Psychiatric/Behavioral: The patient is nervous/anxious.        Objective:   Physical Exam  Constitutional: He appears well-developed and well-nourished.  HENT:  Head: Normocephalic and atraumatic.  Eyes: Pupils are equal, round, and reactive to light.  Neck: Normal range of motion. Neck supple. No JVD present. No thyromegaly present.  Cardiovascular: Regular rhythm.   No murmur heard.      tachycardic  Pulmonary/Chest: No respiratory distress. He has no wheezes.       Distant lung sounds, increased AP diameter.  L lateral chest wall pain much improved.  Abdominal: Soft. Bowel sounds are normal. He exhibits no  distension.  Musculoskeletal: He exhibits no edema.  Lymphadenopathy:    He has no cervical adenopathy.  Skin: Skin is warm and dry. No rash noted.  Psychiatric: He has a normal mood and affect.          Assessment & Plan:

## 2010-07-23 NOTE — Patient Instructions (Signed)
See changes to med list below.  Take everything as prescribed. Stick to a strict low sugar, low carb diet.  Update labs for protein and urine and will call you w/ results by Friday.  Return for f/u in 2 wks.

## 2010-07-23 NOTE — Assessment & Plan Note (Signed)
Improving.  Had a neg w/u for PE, cardiac etiology and never had a rash to suggest shingles.  Finish out meloxicam this month then stop.

## 2010-07-23 NOTE — Assessment & Plan Note (Signed)
Quinapril increased to bid and metoprolol 25 mg bid added (Atenolol stopped by cards).  Will continue on this regimen and f/u in 2 wks.

## 2010-07-23 NOTE — Assessment & Plan Note (Signed)
Sugars continue to run high.

## 2010-07-23 NOTE — Assessment & Plan Note (Signed)
LDL 129 per the ED.  His lipitor was increased from 10 to 20 mg daily.  Repeat LDL in 3 mos.

## 2010-07-24 ENCOUNTER — Other Ambulatory Visit: Payer: Self-pay | Admitting: *Deleted

## 2010-07-24 MED ORDER — METOPROLOL TARTRATE 25 MG PO TABS: 25.0000 mg | ORAL_TABLET | Freq: Two times a day (BID) | ORAL | Status: DC

## 2010-07-26 LAB — PROTEIN, URINE, 24 HOUR: Protein, 24H Urine: 408 mg/d — ABNORMAL HIGH (ref 50–100)

## 2010-07-29 LAB — PROTEIN ELECTROPHORESIS, SERUM
Albumin ELP: 56.9 % (ref 55.8–66.1)
Alpha-1-Globulin: 4.2 % (ref 2.9–4.9)
Alpha-2-Globulin: 14 % — ABNORMAL HIGH (ref 7.1–11.8)
Beta 2: 4 % (ref 3.2–6.5)
Beta Globulin: 6 % (ref 4.7–7.2)
Gamma Globulin: 14.9 % (ref 11.1–18.8)
Total Protein, Serum Electrophoresis: 8 g/dL (ref 6.0–8.3)

## 2010-07-29 LAB — UIFE/LIGHT CHAINS/TP QN, 24-HR UR
Alpha 1, Urine: DETECTED — AB
Alpha 2, Urine: DETECTED — AB
Beta, Urine: DETECTED — AB
Free Kappa Lt Chains,Ur: 1.61 mg/dL — ABNORMAL HIGH (ref 0.04–1.51)
Free Kappa/Lambda Ratio: 8.05 ratio — ABNORMAL HIGH (ref 0.46–4.00)
Free Lt Chn Excr Rate: 38.64 mg/d
Total Protein, Urine: 13 mg/dL

## 2010-07-31 ENCOUNTER — Telehealth: Payer: Self-pay | Admitting: Family Medicine

## 2010-07-31 DIAGNOSIS — R809 Proteinuria, unspecified: Secondary | ICD-10-CM

## 2010-07-31 NOTE — Telephone Encounter (Signed)
I called and LMOM for pt to call back.  He was spilling more protein that normal in his urine and we need to get him in with a kidney specialist for this.  I will go ahead and put his referral in.

## 2010-08-02 NOTE — Telephone Encounter (Signed)
Pt is aware of the above. Pt agrees to see kidney specialist

## 2010-08-02 NOTE — Telephone Encounter (Signed)
Justin Goodman, pls document that pt know about this.

## 2010-08-05 ENCOUNTER — Encounter: Payer: Self-pay | Admitting: Family Medicine

## 2010-08-06 ENCOUNTER — Encounter: Payer: Self-pay | Admitting: Family Medicine

## 2010-08-06 ENCOUNTER — Ambulatory Visit (INDEPENDENT_AMBULATORY_CARE_PROVIDER_SITE_OTHER): Payer: Medicare HMO | Admitting: Family Medicine

## 2010-08-06 DIAGNOSIS — R809 Proteinuria, unspecified: Secondary | ICD-10-CM | POA: Insufficient documentation

## 2010-08-06 DIAGNOSIS — I10 Essential (primary) hypertension: Secondary | ICD-10-CM

## 2010-08-06 DIAGNOSIS — R0789 Other chest pain: Secondary | ICD-10-CM

## 2010-08-06 DIAGNOSIS — R071 Chest pain on breathing: Secondary | ICD-10-CM

## 2010-08-06 MED ORDER — TRAMADOL HCL 50 MG PO TABS: ORAL_TABLET | ORAL | Status: DC

## 2010-08-06 MED ORDER — MELOXICAM 15 MG PO TABS: 15.0000 mg | ORAL_TABLET | Freq: Every day | ORAL | Status: DC

## 2010-08-06 MED ORDER — ATORVASTATIN CALCIUM 40 MG PO TABS: 40.0000 mg | ORAL_TABLET | Freq: Every day | ORAL | Status: DC

## 2010-08-06 NOTE — Patient Instructions (Addendum)
I changed Atorvastatin to 40 mg which you can cut in 1/2 daily.  Bring in urine sample.  Will work on referral to nephrologist in [place] for proteinuria.  BP much improved!  I changed your Oxycodone to Tramadol as needed for pain.  Return for f/u in 1 month.

## 2010-08-06 NOTE — Progress Notes (Signed)
  Subjective:    Patient ID: Justin Goodman, male    DOB: 1955/06/08, 55 y.o.   MRN: [id number]  HPI54 yo WM presents for f/u visit.  Since our last visit, his chest wall pain has resolved.  He thinks that he is getting fatigue from vicodin so wants to try somehting else.  He had proteinuria on his 24 hr urine for protein and an abnrmal UPEP.  C/O voiding more frequently with some urgency.  He consumes 3-4 caffeinated drinks/ day.  He is currently getting set up to see nephrology.   BP 136/80  Pulse 101  Ht 5\' 9"  (1.753 m)  Wt 184 lb (83.462 kg)  BMI 27.17 kg/m2  SpO2 97%  Patient Active Problem List  Diagnoses  . DIABETES MELLITUS, TYPE II  . HYPERLIPIDEMIA  . NEUROPATHY  . ESSENTIAL HYPERTENSION, BENIGN  . LUMBAGO  . INSOMNIA  . Chest wall pain       Review of Systems  Constitutional: Positive for fatigue. Negative for fever and unexpected weight change.  Eyes: Negative for visual disturbance.  Respiratory: Negative for cough, chest tightness, shortness of breath and wheezing.   Cardiovascular: Negative for chest pain, palpitations and leg swelling.  Gastrointestinal: Negative for nausea, abdominal pain and blood in stool.  Genitourinary: Positive for urgency and frequency.  Musculoskeletal: Positive for myalgias and arthralgias.  Neurological: Negative for headaches.  Psychiatric/Behavioral: Negative for dysphoric mood.       Objective:   Physical Exam  Constitutional: He appears well-developed and well-nourished.  HENT:  Head: Normocephalic and atraumatic.  Eyes: Conjunctivae are normal. No scleral icterus.  Neck: Neck supple.  Cardiovascular: Normal rate, regular rhythm and normal heart sounds.   Pulmonary/Chest: Effort normal and breath sounds normal. No respiratory distress. He has no wheezes.  Abdominal: Soft. He exhibits no distension. There is no tenderness.  Musculoskeletal: He exhibits no edema.  Lymphadenopathy:    He has no cervical adenopathy.  Skin:  Skin is warm and dry.  Psychiatric: He has a normal mood and affect.          Assessment & Plan:

## 2010-08-06 NOTE — Assessment & Plan Note (Addendum)
24 hr protein abnormal with an abnormal UPEP.  Referral made to nephrology.  Underlying risk factors: DM and HTN. Already on an ACEi.

## 2010-08-06 NOTE — Assessment & Plan Note (Signed)
Much improved. Continue current meds 

## 2010-08-06 NOTE — Assessment & Plan Note (Signed)
Resolved

## 2010-08-07 ENCOUNTER — Telehealth: Payer: Self-pay | Admitting: Family Medicine

## 2010-08-07 LAB — POCT URINALYSIS DIPSTICK
Bilirubin, UA: NEGATIVE
Glucose, UA: NEGATIVE
Spec Grav, UA: <=1.005
Urobilinogen, UA: 0.2

## 2010-08-07 LAB — POCT UA - MICROALBUMIN
Albumin/Creatinine Ratio, Urine, POC: <30
Creatinine, POC: 300 mg/dL
Microalbumin Ur, POC: 30 mg/dL

## 2010-08-07 NOTE — Telephone Encounter (Signed)
Patient walked in request to know if Quinapril 20mg  should be 2 a day or 1 a day tablets. Patient states that on his bottle he has it says 1 a day and his new refill now says 2 a day and he wanted to make sure 2 day was right before he starts taking them. Patient also requested Meloxicam 15mg  refill and the refill has not been called into his pharmacy yet.

## 2010-08-07 NOTE — Telephone Encounter (Signed)
Pls let pt know that his UA is normal today.  Please let me know if urinary symptoms continue.

## 2010-08-07 NOTE — Telephone Encounter (Signed)
Yes, his quinapril is twice a day as printed on his sheet and I did fill his meloxicam the day I saw him.  Marcelino Duster, pls check with pharmacy before we resend it.

## 2010-08-07 NOTE — Progress Notes (Signed)
Addended by: Payton Spark on: 08/07/2010 01:39 PM   Modules accepted: Orders

## 2010-08-08 NOTE — Telephone Encounter (Signed)
LMOM informing Pt of the above 

## 2010-08-21 ENCOUNTER — Other Ambulatory Visit: Payer: Self-pay | Admitting: *Deleted

## 2010-08-21 DIAGNOSIS — E119 Type 2 diabetes mellitus without complications: Secondary | ICD-10-CM

## 2010-08-21 MED ORDER — INSULIN PEN NEEDLE 31G X 6 MM MISC: 1.0000 [IU] | Freq: Every day | Status: DC

## 2010-08-23 NOTE — Op Note (Signed)
NAMEJERIMEY, Justin Goodman             ACCOUNT NO.:  [id number]   MEDICAL RECORD NO.:  [id number]          PATIENT TYPE:  OIB   LOCATION:  5004                         FACILITY:  [facility]   PHYSICIAN:  Sharolyn Douglas, M.D.        DATE OF BIRTH:  12/18/1955   DATE OF PROCEDURE:  10/31/2004  DATE OF DISCHARGE:                                 OPERATIVE REPORT   DIAGNOSES:  1.  Cervical spondylotic myelopathy.  2.  Large extruded disk herniation, C5-6.   PROCEDURE:  1.  Anterior cervical diskectomy, C4-5 and C5-6, with decompression of the      spinal cord and nerve roots bilaterally.  2.  Anterior cervical arthrodesis, C4-5 and C5-6, placement of 2 allograft      prosthesis spacers packed with local autogenous bone graft.  3.  Anterior cervical plating, C4 through C6, using the Abbott Spine System.   SURGEON:  Sharolyn Douglas, M.D.   ASSISTANT:  Verlin Fester, P.A.   ANESTHESIA:  General endotracheal.   COMPLICATIONS:  None.   ESTIMATED BLOOD LOSS:  Minimal.   INDICATIONS:  The patient is a pleasant gentleman with progressive  myelopathy problems with gait and use of his upper extremities.  He has a  history of spinal stenosis with recent worsening of his symptoms.  MRI scan  shows a large extruded disk herniation superimposed on spondylosis with  severe spinal stenosis at C4-5 greater than C5-6.  He has mild-to-moderate  stenosis at C3-4.  We had discussed various treatment options and at this  time, he has elected to undergo anterior cervical diskectomy and fusion at  C4-5 and C5-6 in hopes of improving his symptoms and preventing further  progression of his myelopathy.   PROCEDURE:  The patient was identified in the holding area and taken to the  operating room.  He underwent general orotracheal anesthesia, with care  taken not to overly bend his neck.  He was then positioned on the operating  room table with the [place] head rest, neck in neutral position.  Neck was  prepped and draped  in the usual sterile fashion.  A transverse incision was  made just above the cricoid cartilage on the left side of the neck.  Dissection was carried sharply through the platysma.  The interval between  the SCM and strap muscles medially was developed down to the prevertebral  space.  The esophagus, trachea and carotid sheath were identified and  protected all times.  C5-6 disk space was identified by the anterior  osteophyte, spinal needle placed, x-ray taken to confirm location.  Deep  retractors were placed.  The longus coli muscle was elevated out over the C4-  5 and C5-6 disk spaces bilaterally.  Caspar distraction pins were placed in  the C4, C5 and C6 vertebral bodies.  Gentle distraction was applied.  Starting at C4-5, a diskectomy was completed back to the posterior  longitudinal ligament.  We found a large extruded, chronic-appearing disk  herniation on the left side which was severely compressing the spinal cord  and thecal sac.  The herniation extended from the midline  all the way into  the foramen.  Using the microscope, microdissection technique, the  herniation was removed from the spinal canal in multiple small fragments.  Some of the disk material appeared to be more chronic and others more acute.  When we were done, the thecal sac reexpanded and had normal pulsations.  Foraminotomies were completed bilaterally.  A 7-mm allograft prosthesis  spacer was then packed with local bone graft obtained from the drill  shavings.  The prosthesis spacer was countersunk 1 mm into the interspace.  We then performed a similar procedure at C5-6.  At this level, there was  more spondylosis.  The drill was used to take down the uncovertebral joints  and posterior vertebral margins.  Posterior longitudinal ligament was taken  down using the 2-mm Kerrison, wide foraminotomies were completed.  We then  placed a 7-mm allograft prosthesis spacer packed with local bone graft into  the interspace  and countersunk it 1 mm.  The Caspar distraction pins were  removed and a 46-mm Abbott Spine anterior cervical plate was applied with  six 12-mm screws; we had good screw purchase and ensured that the locking  mechanism was engaged.  The wound was irrigated.  Hemostasis was achieved.  Penrose drain left in place.  Platysma closed with interrupted 2-0 Vicryl,  subcutaneous layer closed with interrupted 3-0 Vicryl followed by a running  4-0 subcuticular Vicryl suture on skin edges.  Benzoin and Steri-Strips were  placed, cervical collar applied.  The patient was extubated without  difficulty and transferred to Recovery in stable condition, able to move his  upper and lower extremities.       MC/MEDQ  D:  10/31/2004  T:  11/01/2004  Job:  [id number]

## 2010-08-23 NOTE — H&P (Signed)
NAMEDIANGELO, RADEL                         ACCOUNT NO.:  [id number]   MEDICAL RECORD NO.:  [id number]                   PATIENT TYPE:  INP   LOCATION:  5004                                 FACILITY:  [facility]   PHYSICIAN:  Dionne Ano. Amanda Pea, M.D.             DATE OF BIRTH:  06/21/1955   DATE OF ADMISSION:  03/13/2003  DATE OF DISCHARGE:                                HISTORY & PHYSICAL   CHIEF COMPLAINT:  Left wrist pain and swelling, status post limited open  carpal tunnel release on February 14, 2003.   HISTORY OF PRESENT ILLNESS:  Mr. Zorn is a pleasant 55 year old gentleman  who presents to the clinic today with complaints of pain, swelling and  redness about the left wrist, status post a carpal tunnel release performed  on February 14, 2003.  In addition to this, he noticed a open area about  the proximal portion of the incision.  He denies any gross purulent drainage  but states he is certainly tender and has noticed a small amount of  swelling.  He denies any incidents of injury and states that this began  yesterday and has increased overnight and today.   ALLERGIES:  No known drug allergies.   MEDICATIONS:  1. Nexium 40 mg one p.o. q.a.m.  2. Bextra.  3. Robaxin 500 mg one p.o. q.6-8h. p.r.n. spasm.  4. Glucophage 500 mg one p.o. q.i.d.  5. Amaryl 5 mg one p.o. q.a.m.  6. Avapro 1 p.o. daily.  7. Albuterol.  8. Nasonex.   PAST MEDICAL HISTORY:  1. Non-insulin dependent diabetes.  2. Hypertension.   PAST SURGICAL HISTORY:  Left limited open carpal tunnel release and nose  and neck surgery.   FAMILY HISTORY:  His mother and brother has diabetes mellitus.   SOCIAL HISTORY:  He is married, does utilize tobacco and currently does not  use al.   REVIEW OF SYMPTOMS:  Negative.   PHYSICAL EXAMINATION:  GENERAL APPEARANCE:  A very pleasant gentleman 55  years of age, no acute distress.  He is healthy-appearing, well-groomed.  VITAL SIGNS:  Blood pressure 128/82,  pulse 66, respiratory rate 12,  temperature 98.4.  HEENT:  Head is atraumatic and normocephalic.  Pupils are equal, round,  reactive to light and accommodation.  NECK: Supple without masses or carotid bruits noted.  CHEST:  Clear to auscultation bilaterally.  CARDIOVASCULAR:  Regular rate and rhythm with no gross murmurs or rubs  noted.  ABDOMEN:  Soft and nontender.  Bowel sounds are positive.  GENITOURINARY:  Deferred, not pertinent to present illness.  EXTREMITIES:  Evaluation of the left wrist about the volar aspect shows that  he has a small area of dehiscence about the proximal portion of the  incision.  There is no gross purulence noted.  Local debridement is  performed in the clinic without difficulty.  He has mild erythema noted  about the incision and extending into the  wrist aspect with mild edema  noted.  He has full range of motion about the wrist, hands and fingers,  however, this is somewhat tender.  He does have palpable tenderness about  the wrist.  He has no neurovascular compromise or dystrophic changes noted.   ASSESSMENT:  Left wrist peri-incisional cellulitis, status post left limited  carpal tunnel release.   PLAN:  I have discussed with Mr. Weinreb the nature of his upper extremity  predicament.  We will have him admitted the [facility]  to undergo IV antibiotics, elevation and close observation.  All questions  were encouraged and answered.      Karie Chimera, P.A.-C.                   Dionne Ano. Amanda Pea, M.D.    BB/MEDQ  D:  03/13/2003  T:  03/13/2003  Job:  [id number]

## 2010-09-09 ENCOUNTER — Telehealth: Payer: Self-pay | Admitting: Family Medicine

## 2010-09-09 ENCOUNTER — Encounter: Payer: Self-pay | Admitting: Family Medicine

## 2010-09-09 ENCOUNTER — Ambulatory Visit (INDEPENDENT_AMBULATORY_CARE_PROVIDER_SITE_OTHER): Payer: Medicare HMO | Admitting: Family Medicine

## 2010-09-09 DIAGNOSIS — G589 Mononeuropathy, unspecified: Secondary | ICD-10-CM

## 2010-09-09 DIAGNOSIS — R809 Proteinuria, unspecified: Secondary | ICD-10-CM

## 2010-09-09 DIAGNOSIS — E119 Type 2 diabetes mellitus without complications: Secondary | ICD-10-CM

## 2010-09-09 DIAGNOSIS — I10 Essential (primary) hypertension: Secondary | ICD-10-CM

## 2010-09-09 LAB — GLUCOSE, POCT (MANUAL RESULT ENTRY): POC Glucose: 344

## 2010-09-09 LAB — HEMOGLOBIN A1C
Hgb A1c MFr Bld: 8.6 % — ABNORMAL HIGH (ref <5.7)
Mean Plasma Glucose: 200 mg/dL — ABNORMAL HIGH (ref <117)

## 2010-09-09 MED ORDER — MELOXICAM 15 MG PO TABS: 15.0000 mg | ORAL_TABLET | Freq: Every day | ORAL | Status: DC

## 2010-09-09 MED ORDER — ESOMEPRAZOLE MAGNESIUM 40 MG PO CPDR: 40.0000 mg | DELAYED_RELEASE_CAPSULE | Freq: Every day | ORAL | Status: DC

## 2010-09-09 MED ORDER — ASPIRIN EC 81 MG PO TBEC: 81.0000 mg | DELAYED_RELEASE_TABLET | Freq: Every day | ORAL | Status: AC

## 2010-09-09 MED ORDER — METFORMIN HCL 1000 MG PO TABS: 1000.0000 mg | ORAL_TABLET | Freq: Two times a day (BID) | ORAL | Status: DC

## 2010-09-09 MED ORDER — QUINAPRIL HCL 20 MG PO TABS: 20.0000 mg | ORAL_TABLET | Freq: Two times a day (BID) | ORAL | Status: DC

## 2010-09-09 MED ORDER — INSULIN GLARGINE 100 UNIT/ML ~~LOC~~ SOLN: 42.0000 [IU] | Freq: Every day | SUBCUTANEOUS | Status: DC

## 2010-09-09 MED ORDER — AMITRIPTYLINE HCL 25 MG PO TABS: 50.0000 mg | ORAL_TABLET | Freq: Every day | ORAL | Status: DC

## 2010-09-09 MED ORDER — METOPROLOL TARTRATE 25 MG PO TABS: 25.0000 mg | ORAL_TABLET | Freq: Two times a day (BID) | ORAL | Status: DC

## 2010-09-09 MED ORDER — TRAMADOL HCL 50 MG PO TABS: ORAL_TABLET | ORAL | Status: DC

## 2010-09-09 NOTE — Patient Instructions (Addendum)
Increase Amitriptyline to 50 mg (2 tabs) at night for sleep/ chronic pain.  New meter given.  Repeat SPEP/ UPEP / A1C at the lab downstairs.  Nephrology referral pending.  Increase Lantus to 42 units once daily.      REturn for f/u in 6 wks.

## 2010-09-09 NOTE — Assessment & Plan Note (Signed)
BP has improved.  Continue current meds, RFd today.

## 2010-09-09 NOTE — Assessment & Plan Note (Signed)
A complication of his diabetes.  He continue to c/o 'pain all over' which is not really consistent with his neuropathy.  He is on a regimen of amitriptyline at night.  Will go up from 25 to 50 mg qhs, continue gabapentin, tramadol bid and meloxicam daily.

## 2010-09-09 NOTE — Assessment & Plan Note (Signed)
His 24 hr urine for protein came back 4-17 with 312 mg/ day and his UPEP was c/w nephrotic syndrome.  His most recent UA is neg for protein.  I've asked nephrology to see him but we are still waiting on his appt.  I will recheck SPEP and UPEP today.

## 2010-09-09 NOTE — Assessment & Plan Note (Signed)
Complicated by neuropathy.  Poor compliance with diabetic diet.  He is exercising regularly and claims to be compliant with his meds.  Currently on Lantus, will increase from 38--> 42 units/ day and continue metformin and glipizide.  New glucometer given today.  F/u in 1 month.

## 2010-09-09 NOTE — Progress Notes (Signed)
  Subjective:    Patient ID: Justin Goodman, male    DOB: Jan 22, 1956, 55 y.o.   MRN: [id number]  HPI55 yo M presents for f/u visit.  His sugars continue to run high some days.  He averages AM fastings from 120 to 300s.  He is not clear about his diet but is walking 1-2 mi/ day w/o chest pain.  He continue to c/o 'pain all over' his body and fatigue.  He still has a hard time sleeping at night.  On Amitriptyline 25 mg qhs, Neurontintin, Tramadol and Meloxicam.  He did have an abnormal 24 hr urine for protein and abnromal SPEP/ UPEP.  He is waiting to see nephrology.    BP 135/79  Pulse 106  Ht 5\' 9"  (1.753 m)  Wt 181 lb (82.101 kg)  BMI 26.73 kg/m2   Review of Systems  Constitutional: Positive for fatigue. Negative for fever, chills, appetite change and unexpected weight change.  HENT: Negative for neck pain.   Eyes: Negative for visual disturbance.  Respiratory: Negative for shortness of breath.   Cardiovascular: Negative for chest pain, palpitations and leg swelling.  Gastrointestinal: Negative for diarrhea and constipation.  Genitourinary: Negative for difficulty urinating.  Musculoskeletal: Positive for myalgias and arthralgias.  Neurological: Negative for dizziness, weakness and headaches.  Psychiatric/Behavioral: Positive for sleep disturbance. Negative for suicidal ideas. The patient is nervous/anxious.        Objective:   Physical Exam  Constitutional: He appears well-developed and well-nourished. No distress.  HENT:  Head: Normocephalic and atraumatic.  Mouth/Throat: Oropharynx is clear and moist.  Eyes: Conjunctivae are normal. No scleral icterus.  Neck: Neck supple.  Cardiovascular: Normal rate, regular rhythm and normal heart sounds.   No murmur heard. Pulmonary/Chest: Effort normal and breath sounds normal.  Abdominal: Soft. Bowel sounds are normal. He exhibits no distension. There is no tenderness.  Musculoskeletal: He exhibits no edema.  Lymphadenopathy:    He  has no cervical adenopathy.  Skin: Skin is warm and dry.  Psychiatric: He has a normal mood and affect.          Assessment & Plan:

## 2010-09-09 NOTE — Telephone Encounter (Signed)
[facility] called and needs to clarify the dose of quinapril medication. Plan:  Notified the pharmacy and the dose of quinapril is for twice daily and not once.  Notified Heather. Jarvis Newcomer, LPN [facility]

## 2010-09-11 LAB — PROTEIN ELECTROPHORESIS, SERUM
Alpha-1-Globulin: 3.9 % (ref 2.9–4.9)
Gamma Globulin: 15.6 % (ref 11.1–18.8)

## 2010-09-12 DIAGNOSIS — Q245 Malformation of coronary vessels: Secondary | ICD-10-CM | POA: Insufficient documentation

## 2010-09-12 LAB — UIFE/LIGHT CHAINS/TP QN, 24-HR UR
Free Kappa Lt Chains,Ur: 1.42 mg/dL (ref 0.04–1.51)
Free Kappa/Lambda Ratio: 5.26 ratio — ABNORMAL HIGH (ref 0.46–4.00)
Free Lambda Excretion/Day: 8.64 mg/d
Free Lambda Lt Chains,Ur: 0.27 mg/dL (ref 0.08–1.01)
Free Lt Chn Excr Rate: 45.44 mg/d
Total Protein, Urine: 11.4 mg/dL

## 2010-09-14 ENCOUNTER — Telehealth: Payer: Self-pay | Admitting: Family Medicine

## 2010-09-14 NOTE — Telephone Encounter (Signed)
Pls let pt know that his labs are unchanged from previous.  He is still spilling protein in his urine and we need to fax labs to neprholgist for upcoming appt.  His A1C is 8.6 indicating fair control of diabetes.    I spoke to Dr Julio Alm about his SPEP and will consider a referral though Dr Julio Alm did not think it was urgent.

## 2010-09-16 ENCOUNTER — Other Ambulatory Visit: Payer: Self-pay | Admitting: *Deleted

## 2010-09-16 MED ORDER — IPRATROPIUM-ALBUTEROL 18-103 MCG/ACT IN AERO: 2.0000 | INHALATION_SPRAY | Freq: Four times a day (QID) | RESPIRATORY_TRACT | Status: DC | PRN

## 2010-09-16 NOTE — Telephone Encounter (Signed)
Pt aware of the above  

## 2010-09-20 ENCOUNTER — Encounter: Payer: Self-pay | Admitting: Family Medicine

## 2010-10-11 ENCOUNTER — Encounter: Payer: Self-pay | Admitting: Family Medicine

## 2010-10-25 ENCOUNTER — Encounter: Payer: Self-pay | Admitting: Family Medicine

## 2010-10-25 ENCOUNTER — Ambulatory Visit (INDEPENDENT_AMBULATORY_CARE_PROVIDER_SITE_OTHER): Payer: Medicare HMO | Admitting: Family Medicine

## 2010-10-25 DIAGNOSIS — R05 Cough: Secondary | ICD-10-CM

## 2010-10-25 DIAGNOSIS — G4700 Insomnia, unspecified: Secondary | ICD-10-CM

## 2010-10-25 DIAGNOSIS — R059 Cough, unspecified: Secondary | ICD-10-CM

## 2010-10-25 DIAGNOSIS — K219 Gastro-esophageal reflux disease without esophagitis: Secondary | ICD-10-CM

## 2010-10-25 MED ORDER — DOXYCYCLINE HYCLATE 100 MG PO TABS: 100.0000 mg | ORAL_TABLET | Freq: Two times a day (BID) | ORAL | Status: AC

## 2010-10-25 MED ORDER — AMITRIPTYLINE HCL 100 MG PO TABS: 100.0000 mg | ORAL_TABLET | Freq: Every day | ORAL | Status: DC

## 2010-10-25 MED ORDER — OMEPRAZOLE 40 MG PO CPDR: 40.0000 mg | DELAYED_RELEASE_CAPSULE | Freq: Every day | ORAL | Status: DC

## 2010-10-25 MED ORDER — GLIPIZIDE 10 MG PO TABS: 10.0000 mg | ORAL_TABLET | Freq: Two times a day (BID) | ORAL | Status: DC

## 2010-10-25 NOTE — Patient Instructions (Addendum)
Please schedule spirometry to better evaluate your lungs.  Follow up for that in one month.

## 2010-10-25 NOTE — Assessment & Plan Note (Signed)
I discussed with that the amitriptyline is for sleep and for chronic pain doesn't really think this is a great choice of medication. Dr. Cathey Endow recently increasedHim to 50 mg. I encouraged Justin Goodman to try the 100 mg dose.We could also consider trying something like trazodone for sleep if this does not work well.

## 2010-10-25 NOTE — Progress Notes (Signed)
  Subjective:    Patient ID: Justin Goodman, male    DOB: 04-Oct-1955, 55 y.o.   MRN: [id number]  HPI Inosmnia - Usually goes to bed bt 9-10.  Taking 2 hours to fall alseep.  Hurts all he time. He feels this pain is what has been keeping him up longer. He's only getting 2-4 hours of sleep at a time. He denies any increasing caffeine or stress levels. He denies any known triggers for why he is not sleeping as well these last 3 weeks.   Cough is productive for 3 week. Cough is during the day. +SOB. The cough does not keep him awake at night. No prior lung problems. Not sure if fever or not.  No sick contacts. No snoring.  Had surgery for sleep apnea.  Only getting about 2-4 hours of sleep.  No ST. Nasal congestion and runny nose. Tried an [facility] for about 5 days and no relief.  Never had pnuemonia. Marland Kitchen He was never something else he can take. He does not seem to have a formal diagnosis of COPD. He does a lot of reading test he had was approximately 4 years ago. He cannot afford his combivent. Say it was an $85 dollar copay  Also he wants to see if something cheaper than nexium.   Review of Systems     Objective:   Physical Exam  Constitutional: He is oriented to person, place, and time. He appears well-developed and well-nourished.  HENT:  Head: Normocephalic and atraumatic.  Cardiovascular: Normal rate, regular rhythm and normal heart sounds.   Pulmonary/Chest: Effort normal and breath sounds normal.  Neurological: He is alert and oriented to person, place, and time.  Skin: Skin is warm and dry.  Psychiatric: He has a normal mood and affect.          Assessment & Plan:  Cough x3 weeks-I suspect this is most likely from COPD. Once he is well would  like to schedule him for spirometry, next month in August. At this point, we'll go ahead and put him on a course of doxycycline. His lung exam is normal today. It is not improving and we'll get a chest x-ray. Neck he did have a CT  angiogram back in April. He does have Raynaud's but I do not think he has a full sinusitis. Very few death of doxycycline should help this. He did try only medication for one week it did not improve his nasal symptoms. I'm not sure exactly what he was taking for example if it was an antihistamine or not. Some decongestants are labeled as allergy medications.  Third-concern attention to omeprazole for cost savings. He can let me know if he feels it is not working well.  We will check a schedule him for spirometry next month.  Insomnia we will try increasing his amitriptyline 200 mg since it sounds like most of his inability to sleep lately has been increased pain. It is not helping then we can consider not sure sleep aid such as trazodone. In asking him questions there seemed to be no other triggers for his recent insomnia.

## 2010-10-30 ENCOUNTER — Other Ambulatory Visit: Payer: Self-pay | Admitting: *Deleted

## 2010-10-30 ENCOUNTER — Ambulatory Visit: Payer: Medicare HMO | Admitting: Family Medicine

## 2010-10-30 DIAGNOSIS — E119 Type 2 diabetes mellitus without complications: Secondary | ICD-10-CM

## 2010-10-30 MED ORDER — INSULIN PEN NEEDLE 31G X 6 MM MISC: 1.0000 [IU] | Freq: Every day | Status: DC

## 2010-10-30 MED ORDER — OMEPRAZOLE 40 MG PO CPDR: 40.0000 mg | DELAYED_RELEASE_CAPSULE | Freq: Every day | ORAL | Status: DC

## 2010-10-30 MED ORDER — QUINAPRIL HCL 20 MG PO TABS: 20.0000 mg | ORAL_TABLET | Freq: Two times a day (BID) | ORAL | Status: DC

## 2010-10-30 MED ORDER — METFORMIN HCL 1000 MG PO TABS: 1000.0000 mg | ORAL_TABLET | Freq: Two times a day (BID) | ORAL | Status: DC

## 2010-10-30 MED ORDER — TRAMADOL HCL 50 MG PO TABS: ORAL_TABLET | ORAL | Status: DC

## 2010-10-30 MED ORDER — GLIPIZIDE 10 MG PO TABS: 10.0000 mg | ORAL_TABLET | Freq: Two times a day (BID) | ORAL | Status: DC

## 2010-10-30 MED ORDER — METOPROLOL TARTRATE 25 MG PO TABS: 25.0000 mg | ORAL_TABLET | Freq: Two times a day (BID) | ORAL | Status: DC

## 2010-10-30 MED ORDER — ATORVASTATIN CALCIUM 40 MG PO TABS: 40.0000 mg | ORAL_TABLET | Freq: Every day | ORAL | Status: DC

## 2010-10-30 MED ORDER — AMITRIPTYLINE HCL 100 MG PO TABS: 100.0000 mg | ORAL_TABLET | Freq: Every day | ORAL | Status: DC

## 2010-10-30 MED ORDER — GABAPENTIN 300 MG PO CAPS: ORAL_CAPSULE | ORAL | Status: DC

## 2010-10-30 MED ORDER — INSULIN GLARGINE 100 UNIT/ML ~~LOC~~ SOLN: 42.0000 [IU] | Freq: Every day | SUBCUTANEOUS | Status: DC

## 2010-11-25 ENCOUNTER — Ambulatory Visit (INDEPENDENT_AMBULATORY_CARE_PROVIDER_SITE_OTHER): Payer: Medicare HMO | Admitting: Family Medicine

## 2010-11-25 ENCOUNTER — Encounter: Payer: Self-pay | Admitting: Family Medicine

## 2010-11-25 DIAGNOSIS — R0602 Shortness of breath: Secondary | ICD-10-CM

## 2010-11-25 DIAGNOSIS — F17200 Nicotine dependence, unspecified, uncomplicated: Secondary | ICD-10-CM

## 2010-11-25 MED ORDER — AMITRIPTYLINE HCL 100 MG PO TABS: 100.0000 mg | ORAL_TABLET | Freq: Every day | ORAL | Status: DC

## 2010-11-25 MED ORDER — ALBUTEROL SULFATE HFA 108 (90 BASE) MCG/ACT IN AERS: 2.0000 | INHALATION_SPRAY | Freq: Four times a day (QID) | RESPIRATORY_TRACT | Status: DC | PRN

## 2010-11-25 MED ORDER — INSULIN GLARGINE 100 UNIT/ML ~~LOC~~ SOLN: 42.0000 [IU] | Freq: Every day | SUBCUTANEOUS | Status: DC

## 2010-11-25 MED ORDER — TRAMADOL HCL 50 MG PO TABS: ORAL_TABLET | ORAL | Status: DC

## 2010-11-25 NOTE — Progress Notes (Signed)
  Subjective:    Patient ID: Justin Goodman, male    DOB: 11-17-1955, 55 y.o.   MRN: [id number]  HPI  First today to followup on his chronic shortness of breath. He is a 15-pack-year smoker. He is currently on disability but says he did previously work in a job where they actually did use monitoring testing on him every 3-4 months. He was tested was about 4 years ago at work he says that he was told the results were normal. I'm not exactly sure what his potential exposure was, but he says it was chemicals. He says he was told to quit smoking.  Review of Systems     Objective:   Physical Exam        Assessment & Plan:  Spirometry results showed a FVC of 69%, FEV1 of 65% and a ratio of 76% this is consistent with moderate restriction though I suspect he probably has also a component of obstruction. I think he needs further testing with full pulmonary PFTs at the hospital. We discussed the findings and the need to quit smoking. Also consider pulmonary referral.  He also needed refills on several medications.  He declined a flu vaccine today.

## 2010-12-30 ENCOUNTER — Encounter: Payer: Self-pay | Admitting: Family Medicine

## 2010-12-30 ENCOUNTER — Telehealth: Payer: Self-pay | Admitting: Family Medicine

## 2010-12-30 ENCOUNTER — Ambulatory Visit (INDEPENDENT_AMBULATORY_CARE_PROVIDER_SITE_OTHER): Payer: Medicare HMO | Admitting: Family Medicine

## 2010-12-30 DIAGNOSIS — E119 Type 2 diabetes mellitus without complications: Secondary | ICD-10-CM

## 2010-12-30 DIAGNOSIS — G4700 Insomnia, unspecified: Secondary | ICD-10-CM

## 2010-12-30 DIAGNOSIS — I10 Essential (primary) hypertension: Secondary | ICD-10-CM

## 2010-12-30 MED ORDER — INSULIN GLARGINE 100 UNIT/ML ~~LOC~~ SOLN: 50.0000 [IU] | Freq: Every day | SUBCUTANEOUS | Status: DC

## 2010-12-30 MED ORDER — INSULIN GLARGINE 100 UNIT/ML ~~LOC~~ SOLN: 45.0000 [IU] | Freq: Every day | SUBCUTANEOUS | Status: DC

## 2010-12-30 MED ORDER — METOPROLOL TARTRATE 50 MG PO TABS: 50.0000 mg | ORAL_TABLET | Freq: Two times a day (BID) | ORAL | Status: DC

## 2010-12-30 MED ORDER — ESZOPICLONE 3 MG PO TABS: 3.0000 mg | ORAL_TABLET | Freq: Every day | ORAL | Status: DC

## 2010-12-30 NOTE — Telephone Encounter (Signed)
Would he like to try trazodone? Also lets change to f/u in 1 mo.

## 2010-12-30 NOTE — Progress Notes (Signed)
  Subjective:    Patient ID: Justin Goodman, male    DOB: 09-20-1955, 55 y.o.   MRN: [id number]  Diabetes He presents for his follow-up diabetic visit. He has type 2 diabetes mellitus. Pertinent negatives for diabetes include no chest pain. Risk factors for coronary artery disease include hypertension. When asked about current treatments, none were reported. His weight is decreasing steadily. He is following a generally healthy diet. He participates in exercise three times a week. His breakfast blood glucose range is generally 140-180 mg/dl. An ACE inhibitor/angiotensin II receptor blocker is being taken. Eye exam is current.      Review of Systems  Cardiovascular: Negative for chest pain.       Objective:   Physical Exam  Constitutional: He appears well-developed and well-nourished.  HENT:  Head: Normocephalic and atraumatic.  Cardiovascular: Normal rate, regular rhythm and normal heart sounds.   Pulmonary/Chest: Effort normal and breath sounds normal.  Skin: Skin is warm and dry.  Psychiatric: He has a normal mood and affect. His behavior is normal.          Assessment & Plan:  DM- Increase lantus to 45 units and wrote out instructions on how to increase to 50 unit, F/U in 1 mo. Continue to work on diet and exercise as I do think this is helping. He was frustrated today since no wt loss.   HTN- BP still elevated. Will inc metoprolol to 50mg  bid. Recheck in 1-2 mo.

## 2010-12-30 NOTE — Telephone Encounter (Signed)
Pt script for lunesta 3 mg too expensive.  Pt requesting diff medication.  Please advise. Plan:  Routed to Dr. Marlyne Beards, LPN [facility]

## 2010-12-30 NOTE — Patient Instructions (Signed)
Lantus 45 units daily for one week, then increase to 48 units daily if your sugars are over 130.  If a week later on the 48 units sugar still over 130 then go to 50 units.Marland Kitchen

## 2010-12-30 NOTE — Assessment & Plan Note (Addendum)
Has tried [place] and amitryptiline 100mg  nad not working for sleep. Will try lunesta. If still not helping then consider xanax.

## 2010-12-31 ENCOUNTER — Other Ambulatory Visit: Payer: Self-pay | Admitting: *Deleted

## 2010-12-31 ENCOUNTER — Telehealth: Payer: Self-pay | Admitting: Family Medicine

## 2010-12-31 MED ORDER — ATORVASTATIN CALCIUM 40 MG PO TABS: 40.0000 mg | ORAL_TABLET | Freq: Every day | ORAL | Status: DC

## 2010-12-31 MED ORDER — METFORMIN HCL 1000 MG PO TABS: 1000.0000 mg | ORAL_TABLET | Freq: Two times a day (BID) | ORAL | Status: DC

## 2010-12-31 MED ORDER — IPRATROPIUM-ALBUTEROL 18-103 MCG/ACT IN AERO: 2.0000 | INHALATION_SPRAY | Freq: Four times a day (QID) | RESPIRATORY_TRACT | Status: DC | PRN

## 2010-12-31 MED ORDER — QUINAPRIL HCL 20 MG PO TABS: 20.0000 mg | ORAL_TABLET | Freq: Two times a day (BID) | ORAL | Status: DC

## 2010-12-31 MED ORDER — TRAZODONE HCL 50 MG PO TABS: 50.0000 mg | ORAL_TABLET | Freq: Every day | ORAL | Status: DC

## 2010-12-31 MED ORDER — GLIPIZIDE 10 MG PO TABS: 10.0000 mg | ORAL_TABLET | Freq: Two times a day (BID) | ORAL | Status: DC

## 2010-12-31 MED ORDER — TRAMADOL HCL 50 MG PO TABS: ORAL_TABLET | ORAL | Status: DC

## 2010-12-31 MED ORDER — GABAPENTIN 300 MG PO CAPS: ORAL_CAPSULE | ORAL | Status: DC

## 2010-12-31 MED ORDER — OMEPRAZOLE 40 MG PO CPDR: 40.0000 mg | DELAYED_RELEASE_CAPSULE | Freq: Every day | ORAL | Status: DC

## 2010-12-31 MED ORDER — ALBUTEROL SULFATE HFA 108 (90 BASE) MCG/ACT IN AERS: 2.0000 | INHALATION_SPRAY | Freq: Four times a day (QID) | RESPIRATORY_TRACT | Status: DC | PRN

## 2010-12-31 NOTE — Telephone Encounter (Signed)
Closed

## 2010-12-31 NOTE — Telephone Encounter (Signed)
Pt called back and spoke with the triage nurse and he wishes to do the trazadone so will start with 50 mg nightly, and pt informed script sent to the pharm [facility].  Dose regimen was confirmed with Dr. Linford Arnold before sending the script. Jarvis Newcomer, LPN [facility]

## 2011-01-02 LAB — URINALYSIS, ROUTINE W REFLEX MICROSCOPIC
Ketones, ur: NEGATIVE
Leukocytes, UA: NEGATIVE
Nitrite: NEGATIVE
Specific Gravity, Urine: 1.013
Urobilinogen, UA: 0.2
pH: 5.5

## 2011-01-02 LAB — POCT I-STAT, CHEM 8
Calcium, Ion: 1.19
Chloride: 102
HCT: 49
Potassium: 3.6

## 2011-01-02 LAB — BLOOD GAS, ARTERIAL
Acid-base deficit: 0.9
Bicarbonate: 23.7
TCO2: 20.5
pCO2 arterial: 40.3
pH, Arterial: 7.385
pO2, Arterial: 57.3 — ABNORMAL LOW

## 2011-01-03 ENCOUNTER — Other Ambulatory Visit: Payer: Self-pay | Admitting: Family Medicine

## 2011-01-03 MED ORDER — QUINAPRIL HCL 20 MG PO TABS: 20.0000 mg | ORAL_TABLET | Freq: Two times a day (BID) | ORAL | Status: DC

## 2011-01-06 LAB — POCT CARDIAC MARKERS
CKMB, poc: 6.9
Myoglobin, poc: 69
Troponin i, poc: <0.05

## 2011-01-06 LAB — URINE MICROSCOPIC-ADD ON

## 2011-01-06 LAB — URINALYSIS, ROUTINE W REFLEX MICROSCOPIC
Nitrite: NEGATIVE
Protein, ur: 100 — AB
Specific Gravity, Urine: 1.025
Urobilinogen, UA: 0.2

## 2011-01-06 LAB — POCT I-STAT, CHEM 8
BUN: 17
Chloride: 101
Creatinine, Ser: 0.7
Potassium: 4.4
Sodium: 136

## 2011-01-07 LAB — HEPATIC FUNCTION PANEL
ALT: 20
AST: 21
Albumin: 3.9
Alkaline Phosphatase: 64
Bilirubin, Direct: <0.1
Total Bilirubin: 0.8
Total Protein: 7

## 2011-01-07 LAB — DIFFERENTIAL
Basophils Absolute: 0
Basophils Relative: 0
Eosinophils Absolute: 0.1
Eosinophils Relative: 2
Lymphocytes Relative: 36
Lymphs Abs: 1.3
Monocytes Absolute: 0.4
Monocytes Relative: 11
Neutro Abs: 1.8
Neutrophils Relative %: 50

## 2011-01-07 LAB — URINALYSIS, ROUTINE W REFLEX MICROSCOPIC
Bilirubin Urine: NEGATIVE
Glucose, UA: NEGATIVE
Hgb urine dipstick: NEGATIVE
Ketones, ur: NEGATIVE
Leukocytes, UA: NEGATIVE
Nitrite: NEGATIVE
Protein, ur: 30 — AB
Specific Gravity, Urine: 1.023
Urobilinogen, UA: 1
pH: 7.5

## 2011-01-07 LAB — BASIC METABOLIC PANEL
BUN: 11
Chloride: 103
GFR calc non Af Amer: >60
Potassium: 4
Sodium: 137

## 2011-01-07 LAB — LIPASE, BLOOD: Lipase: 29

## 2011-01-07 LAB — CBC
HCT: 46.3
Hemoglobin: 15.4
MCHC: 33.3
MCV: 85.7
Platelets: 205
RBC: 5.4
RDW: 14.4
WBC: 3.6 — ABNORMAL LOW

## 2011-01-07 LAB — BASIC METABOLIC PANEL WITH GFR
CO2: 27
Calcium: 9.8
Creatinine, Ser: 0.5
GFR calc Af Amer: >60
Glucose, Bld: 176 — ABNORMAL HIGH

## 2011-01-07 LAB — URINE MICROSCOPIC-ADD ON

## 2011-01-15 ENCOUNTER — Telehealth: Payer: Self-pay | Admitting: *Deleted

## 2011-01-15 MED ORDER — METOPROLOL TARTRATE 50 MG PO TABS: 50.0000 mg | ORAL_TABLET | Freq: Two times a day (BID) | ORAL | Status: DC

## 2011-01-15 NOTE — Telephone Encounter (Signed)
Pt called and states pharm did not get the rx that was sent. Will reprint and fax rx

## 2011-01-15 NOTE — Telephone Encounter (Signed)
Pt called and wants to know if he  Can take two 50 mg tramadol  Because one pill is not helping his pain

## 2011-01-15 NOTE — Telephone Encounter (Signed)
Pt.notified

## 2011-01-15 NOTE — Telephone Encounter (Signed)
Okay to try taking 2 tabs. He can also take an extra strength Tylenol with the tramadol and this has been shown in studies to improve pain control.

## 2011-01-31 ENCOUNTER — Encounter: Payer: Self-pay | Admitting: Family Medicine

## 2011-01-31 ENCOUNTER — Telehealth: Payer: Self-pay | Admitting: *Deleted

## 2011-01-31 ENCOUNTER — Ambulatory Visit (INDEPENDENT_AMBULATORY_CARE_PROVIDER_SITE_OTHER): Payer: Medicare HMO | Admitting: Family Medicine

## 2011-01-31 DIAGNOSIS — E119 Type 2 diabetes mellitus without complications: Secondary | ICD-10-CM

## 2011-01-31 DIAGNOSIS — G4700 Insomnia, unspecified: Secondary | ICD-10-CM

## 2011-01-31 DIAGNOSIS — I10 Essential (primary) hypertension: Secondary | ICD-10-CM

## 2011-01-31 DIAGNOSIS — Z1211 Encounter for screening for malignant neoplasm of colon: Secondary | ICD-10-CM

## 2011-01-31 MED ORDER — GABAPENTIN 300 MG PO CAPS: ORAL_CAPSULE | ORAL | Status: DC

## 2011-01-31 MED ORDER — QUINAPRIL HCL 20 MG PO TABS: 20.0000 mg | ORAL_TABLET | Freq: Two times a day (BID) | ORAL | Status: DC

## 2011-01-31 MED ORDER — MELOXICAM 15 MG PO TABS: 15.0000 mg | ORAL_TABLET | Freq: Every day | ORAL | Status: DC

## 2011-01-31 MED ORDER — INSULIN GLARGINE 100 UNIT/ML ~~LOC~~ SOLN: 50.0000 [IU] | Freq: Every day | SUBCUTANEOUS | Status: DC

## 2011-01-31 MED ORDER — METOPROLOL TARTRATE 50 MG PO TABS: 50.0000 mg | ORAL_TABLET | Freq: Two times a day (BID) | ORAL | Status: DC

## 2011-01-31 MED ORDER — OMEPRAZOLE 40 MG PO CPDR: 40.0000 mg | DELAYED_RELEASE_CAPSULE | Freq: Every day | ORAL | Status: DC

## 2011-01-31 MED ORDER — METFORMIN HCL 1000 MG PO TABS: 1000.0000 mg | ORAL_TABLET | Freq: Two times a day (BID) | ORAL | Status: DC

## 2011-01-31 MED ORDER — GLIPIZIDE 10 MG PO TABS: 10.0000 mg | ORAL_TABLET | Freq: Two times a day (BID) | ORAL | Status: DC

## 2011-01-31 MED ORDER — TRAMADOL HCL 50 MG PO TABS: ORAL_TABLET | ORAL | Status: DC

## 2011-01-31 MED ORDER — ALBUTEROL SULFATE HFA 108 (90 BASE) MCG/ACT IN AERS: 2.0000 | INHALATION_SPRAY | Freq: Four times a day (QID) | RESPIRATORY_TRACT | Status: DC | PRN

## 2011-01-31 MED ORDER — ATORVASTATIN CALCIUM 40 MG PO TABS: 40.0000 mg | ORAL_TABLET | Freq: Every day | ORAL | Status: DC

## 2011-01-31 MED ORDER — IPRATROPIUM-ALBUTEROL 18-103 MCG/ACT IN AERO: 2.0000 | INHALATION_SPRAY | Freq: Four times a day (QID) | RESPIRATORY_TRACT | Status: DC | PRN

## 2011-01-31 MED ORDER — TRAZODONE HCL 100 MG PO TABS: 100.0000 mg | ORAL_TABLET | Freq: Every day | ORAL | Status: DC

## 2011-01-31 NOTE — Progress Notes (Signed)
  Subjective:    Patient ID: Justin Goodman, male    DOB: 1956-01-10, 55 y.o.   MRN: [id number]  Diabetes He presents for his follow-up diabetic visit. He has type 2 diabetes mellitus. His disease course has been worsening. There are no hypoglycemic associated symptoms. Pertinent negatives for diabetes include no chest pain, no polydipsia, no polyphagia, no polyuria and no weight loss. Symptoms are stable. Current diabetic treatment includes insulin injections and oral agent (dual therapy). He is compliant with treatment all of the time. He has not had a previous visit with a dietician. He rarely participates in exercise. His breakfast blood glucose range is generally 180-200 mg/dl. An ACE inhibitor/angiotensin II receptor blocker is being taken.  Hypertension This is a chronic problem. The current episode started more than 1 year ago. The problem is unchanged. The problem is controlled. Pertinent negatives include no chest pain or shortness of breath. Past treatments include ACE inhibitors and beta blockers. The current treatment provides moderate improvement. There are no compliance problems.    he feels that his blood sugars were better when he was on 36 units of Lantus versus 50 units of Lantus. I encouraged him to try to stick with the 50 units for now. He is not having any lows.  Insomnia. He's been taking the trazodone 50 mg at bedtime it helps some. But, when he takes 2 tablets he he sleeps much better. He wants to increase his dose to 100 mg instead.  He is also asking to get out of jury duty today.  Review of Systems  Constitutional: Negative for weight loss.  Respiratory: Negative for shortness of breath.   Cardiovascular: Negative for chest pain.  Genitourinary: Negative for polyuria.  Hematological: Negative for polydipsia and polyphagia.       Objective:   Physical Exam  Constitutional: He is oriented to person, place, and time. He appears well-developed and well-nourished.    HENT:  Head: Normocephalic and atraumatic.  Cardiovascular: Normal rate, regular rhythm and normal heart sounds.        No carotid bruits  Pulmonary/Chest: Effort normal and breath sounds normal.  Musculoskeletal: He exhibits no edema.  Neurological: He is alert and oriented to person, place, and time.  Skin: Skin is warm and dry.  Psychiatric: He has a normal mood and affect. His behavior is normal.          Assessment & Plan:  Diabetes-I asked him to stick with the 50 units of Lantus he can and he is convinced to sugars are worse on this. I'm not sure that that's possible. I will see him back in 2 months to see if it improves his A1c. I suspect that he will actually end up needing more than 50 units.  Hypertension-well controlled today. Looks fantastic.  Insomnia-I increased his trazodone 200 mg at bedtime. New prescription was sent.  I also discussed the importance of colon cancer screening. Patient agreed to referral for colonoscopy.  I denied to fill out his forms to waive jury duty.

## 2011-01-31 NOTE — Telephone Encounter (Signed)
Pt wants all his medications sent to [facility]. No longer want to use [facility]. [phone]

## 2011-02-07 ENCOUNTER — Telehealth: Payer: Self-pay | Admitting: Family Medicine

## 2011-02-07 NOTE — Telephone Encounter (Signed)
[facility] calling to verify the dose of pt trazadone medication, and they need to verify the med dosing schedule.  Pt is to be taking 100 mg trazadone at bedtime.  The dose was increased from 50 mg.  Verified this dose with the provider since there is discrepancy in the dictation note vs. Med list.    Plan:  Called the pharmacist back and verified the dose of 100 mg QHS.Marland Kitchen  Pt aware and requested for the med to go to [facility], and not [facility].. Called # 30 /5 refills since going to local pharm to equate to mail order amt. Jarvis Newcomer, LPN [facility]

## 2011-02-07 NOTE — Telephone Encounter (Signed)
Error

## 2011-02-12 ENCOUNTER — Other Ambulatory Visit: Payer: Self-pay | Admitting: Family Medicine

## 2011-03-04 ENCOUNTER — Ambulatory Visit: Payer: Medicare HMO | Admitting: Family Medicine

## 2011-03-24 ENCOUNTER — Telehealth: Payer: Self-pay | Admitting: *Deleted

## 2011-03-24 NOTE — Telephone Encounter (Signed)
Pt called and states his blood sugar has gone down to 50. Please advise

## 2011-03-24 NOTE — Telephone Encounter (Signed)
Pt.notified

## 2011-03-24 NOTE — Telephone Encounter (Signed)
Decrease lantus to 42 units a day.

## 2011-04-04 ENCOUNTER — Encounter: Payer: Self-pay | Admitting: Family Medicine

## 2011-04-04 ENCOUNTER — Ambulatory Visit (INDEPENDENT_AMBULATORY_CARE_PROVIDER_SITE_OTHER): Payer: Medicare HMO | Admitting: Family Medicine

## 2011-04-04 DIAGNOSIS — IMO0001 Reserved for inherently not codable concepts without codable children: Secondary | ICD-10-CM

## 2011-04-04 DIAGNOSIS — R202 Paresthesia of skin: Secondary | ICD-10-CM

## 2011-04-04 DIAGNOSIS — R209 Unspecified disturbances of skin sensation: Secondary | ICD-10-CM

## 2011-04-04 DIAGNOSIS — R51 Headache: Secondary | ICD-10-CM

## 2011-04-04 DIAGNOSIS — E119 Type 2 diabetes mellitus without complications: Secondary | ICD-10-CM

## 2011-04-04 DIAGNOSIS — M791 Myalgia, unspecified site: Secondary | ICD-10-CM

## 2011-04-04 DIAGNOSIS — R519 Headache, unspecified: Secondary | ICD-10-CM

## 2011-04-04 LAB — POCT GLYCOSYLATED HEMOGLOBIN (HGB A1C): Hemoglobin A1C: 7

## 2011-04-04 NOTE — Patient Instructions (Signed)
Stop your atorvastatin for 2-3 weeks and see if you feel better. If better then restart the medication and see if your symptoms return. If they do then call me and we can change your medication

## 2011-04-04 NOTE — Progress Notes (Signed)
  Subjective:    Patient ID: Justin Goodman, male    DOB: 12-26-1955, 55 y.o.   MRN: [id number]  Diabetes He presents for his follow-up diabetic visit. He has type 2 diabetes mellitus. His disease course has been stable. There are no hypoglycemic associated symptoms. Pertinent negatives for diabetes include no blurred vision, no foot paresthesias, no foot ulcerations, no polydipsia, no polyphagia, no polyuria and no weight loss. There are no hypoglycemic complications. Symptoms are stable. He is compliant with treatment all of the time. He is following a generally healthy diet. He has not had a previous visit with a dietician. He participates in exercise three times a week. There is no change in his home blood glucose trend. An ACE inhibitor/angiotensin II receptor blocker is being taken. Eye exam is current.  had to dec his insulin as his sugars were low in the afternoons.  Left sided facial pain. Says lkike a needle sticking in his jaw.  Says his left ear will feel numb. Started about a month ago. Intermittant. Not every day. He denies any tooth or gum pain. No aggrevating or alleviating sxs. Says when hits the pain is severe.    Review of Systems  Constitutional: Negative for weight loss.  Eyes: Negative for blurred vision.  Genitourinary: Negative for polyuria.  Hematological: Negative for polydipsia and polyphagia.       Objective:   Physical Exam  Constitutional: He is oriented to person, place, and time. He appears well-developed and well-nourished.  HENT:  Head: Normocephalic and atraumatic.  Right Ear: External ear normal.  Left Ear: External ear normal.  Nose: Nose normal.  Mouth/Throat: Oropharynx is clear and moist.       Both TMS blocked by cerumen.   Eyes: Conjunctivae and EOM are normal. Pupils are equal, round, and reactive to light.  Neck: Neck supple. No thyromegaly present.  Cardiovascular: Normal rate, regular rhythm and normal heart sounds.   Pulmonary/Chest:  Effort normal and breath sounds normal.  Lymphadenopathy:    He has no cervical adenopathy.  Neurological: He is alert and oriented to person, place, and time. No cranial nerve deficit.       senstation to light touch is symmetric on his face and ears.   Skin: Skin is warm and dry.  Psychiatric: He has a normal mood and affect.          Assessment & Plan:  DM- Well controlled. He has done a great job and is working out regularly.  Continue current regime but try inc insulin to 44 units daily. BP is well controlled.  Lab Results  Component Value Date   HGBA1C 7.0 04/04/2011   Left sided face pain - consider neuralgia. Ear exam is normal. He denies any gum or tooth pain.  We did irrgate both ears. TMS were clear and no fign of infection. If pain persists over next couple of weeks then call me and consider neurology referral.   C/O of diffuse body pain.- wil have him hold his lipitor for 2-3 weeks and see if he feels better. If he does then restart the medicine to see if reproduces his symptoms. If so then let me know and will try another statin.   Declined colonoscopy. Given stool cards today.

## 2011-04-07 ENCOUNTER — Other Ambulatory Visit: Payer: Self-pay | Admitting: *Deleted

## 2011-04-07 MED ORDER — TRAZODONE HCL 100 MG PO TABS: 100.0000 mg | ORAL_TABLET | Freq: Every day | ORAL | Status: DC

## 2011-04-07 MED ORDER — GABAPENTIN 300 MG PO CAPS: ORAL_CAPSULE | ORAL | Status: DC

## 2011-04-07 MED ORDER — QUINAPRIL HCL 20 MG PO TABS: 20.0000 mg | ORAL_TABLET | Freq: Two times a day (BID) | ORAL | Status: DC

## 2011-04-07 MED ORDER — ATORVASTATIN CALCIUM 40 MG PO TABS: 40.0000 mg | ORAL_TABLET | Freq: Every day | ORAL | Status: DC

## 2011-04-07 MED ORDER — INSULIN GLARGINE 100 UNIT/ML ~~LOC~~ SOLN: 42.0000 [IU] | Freq: Every day | SUBCUTANEOUS | Status: DC

## 2011-04-07 MED ORDER — OMEPRAZOLE 40 MG PO CPDR: 40.0000 mg | DELAYED_RELEASE_CAPSULE | Freq: Every day | ORAL | Status: DC

## 2011-04-07 MED ORDER — METOPROLOL TARTRATE 50 MG PO TABS: 50.0000 mg | ORAL_TABLET | Freq: Two times a day (BID) | ORAL | Status: DC

## 2011-04-07 MED ORDER — TRAMADOL HCL 50 MG PO TABS: ORAL_TABLET | ORAL | Status: DC

## 2011-04-07 MED ORDER — MELOXICAM 15 MG PO TABS: 15.0000 mg | ORAL_TABLET | Freq: Every day | ORAL | Status: DC

## 2011-04-07 MED ORDER — METFORMIN HCL 1000 MG PO TABS: 1000.0000 mg | ORAL_TABLET | Freq: Two times a day (BID) | ORAL | Status: DC

## 2011-04-15 ENCOUNTER — Other Ambulatory Visit: Payer: Self-pay | Admitting: *Deleted

## 2011-05-06 ENCOUNTER — Other Ambulatory Visit: Payer: Self-pay | Admitting: Family Medicine

## 2011-05-12 ENCOUNTER — Other Ambulatory Visit: Payer: Self-pay | Admitting: Family Medicine

## 2011-05-20 ENCOUNTER — Encounter: Payer: Self-pay | Admitting: Family Medicine

## 2011-05-20 ENCOUNTER — Ambulatory Visit (INDEPENDENT_AMBULATORY_CARE_PROVIDER_SITE_OTHER): Payer: Medicare HMO | Admitting: Family Medicine

## 2011-05-20 DIAGNOSIS — G8929 Other chronic pain: Secondary | ICD-10-CM

## 2011-05-20 DIAGNOSIS — R5383 Other fatigue: Secondary | ICD-10-CM

## 2011-05-20 DIAGNOSIS — R5381 Other malaise: Secondary | ICD-10-CM

## 2011-05-20 DIAGNOSIS — G4701 Insomnia due to medical condition: Secondary | ICD-10-CM

## 2011-05-20 DIAGNOSIS — E785 Hyperlipidemia, unspecified: Secondary | ICD-10-CM

## 2011-05-20 MED ORDER — KETOROLAC TROMETHAMINE 60 MG/2ML IM SOLN: 60.0000 mg | Freq: Once | INTRAMUSCULAR | Status: DC

## 2011-05-20 MED ORDER — MELOXICAM 15 MG PO TABS: 15.0000 mg | ORAL_TABLET | Freq: Every day | ORAL | Status: DC

## 2011-05-20 MED ORDER — COLESEVELAM HCL 625 MG PO TABS: 1875.0000 mg | ORAL_TABLET | Freq: Two times a day (BID) | ORAL | Status: DC

## 2011-05-20 MED ORDER — GABAPENTIN 300 MG PO CAPS: ORAL_CAPSULE | ORAL | Status: DC

## 2011-05-20 MED ORDER — KETOROLAC TROMETHAMINE 60 MG/2ML IM SOLN
60.0000 mg | Freq: Once | INTRAMUSCULAR | Status: AC
  Administered 2011-05-20: 60 mg via INTRAMUSCULAR

## 2011-05-20 MED ORDER — TRAZODONE HCL 150 MG PO TABS: 150.0000 mg | ORAL_TABLET | Freq: Every day | ORAL | Status: DC

## 2011-05-20 MED ORDER — TRAMADOL HCL 50 MG PO TABS: 100.0000 mg | ORAL_TABLET | Freq: Three times a day (TID) | ORAL | Status: DC | PRN

## 2011-05-20 NOTE — Patient Instructions (Signed)
Hypertriglyceridemia  Diet for High blood levels of Triglycerides Most fats in food are triglycerides. Triglycerides in your blood are stored as fat in your body. High levels of triglycerides in your blood may put you at a greater risk for heart disease and stroke.  Normal triglyceride levels are less than 150 mg/dL. Borderline high levels are 150-199 mg/dl. High levels are 200 - 499 mg/dL, and very high triglyceride levels are greater than 500 mg/dL. The decision to treat high triglycerides is generally based on the level. For people with borderline or high triglyceride levels, treatment includes weight loss and exercise. Drugs are recommended for people with very high triglyceride levels. Many people who need treatment for high triglyceride levels have metabolic syndrome. This syndrome is a collection of disorders that often include: insulin resistance, high blood pressure, blood clotting problems, high cholesterol and triglycerides. TESTING PROCEDURE FOR TRIGLYCERIDES  You should not eat 4 hours before getting your triglycerides measured. The normal range of triglycerides is between 10 and 250 milligrams per deciliter (mg/dl). Some people may have extreme levels (1000 or above), but your triglyceride level may be too high if it is above 150 mg/dl, depending on what other risk factors you have for heart disease.   People with high blood triglycerides may also have high blood cholesterol levels. If you have high blood cholesterol as well as high blood triglycerides, your risk for heart disease is probably greater than if you only had high triglycerides. High blood cholesterol is one of the main risk factors for heart disease.  CHANGING YOUR DIET  Your weight can affect your blood triglyceride level. If you are more than 20% above your ideal body weight, you may be able to lower your blood triglycerides by losing weight. Eating less and exercising regularly is the best way to combat this. Fat provides  more calories than any other food. The best way to lose weight is to eat less fat. Only 30% of your total calories should come from fat. Less than 7% of your diet should come from saturated fat. A diet low in fat and saturated fat is the same as a diet to decrease blood cholesterol. By eating a diet lower in fat, you may lose weight, lower your blood cholesterol, and lower your blood triglyceride level.  Eating a diet low in fat, especially saturated fat, may also help you lower your blood triglyceride level. Ask your dietitian to help you figure how much fat you can eat based on the number of calories your caregiver has prescribed for you.  Exercise, in addition to helping with weight loss may also help lower triglyceride levels.   Alcohol can increase blood triglycerides. You may need to stop drinking alcoholic beverages.   Too much carbohydrate in your diet may also increase your blood triglycerides. Some complex carbohydrates are necessary in your diet. These may include bread, rice, potatoes, other starchy vegetables and cereals.   Reduce "simple" carbohydrates. These may include pure sugars, candy, honey, and jelly without losing other nutrients. If you have the kind of high blood triglycerides that is affected by the amount of carbohydrates in your diet, you will need to eat less sugar and less high-sugar foods. Your caregiver can help you with this.   Adding 2-4 grams of fish oil (EPA+ DHA) may also help lower triglycerides. Speak with your caregiver before adding any supplements to your regimen.  Following the Diet  Maintain your ideal weight. Your caregivers can help you with a diet. Generally,   eating less food and getting more exercise will help you lose weight. Joining a weight control group may also help. Ask your caregivers for a good weight control group in your area.  Eat low-fat foods instead of high-fat foods. This can help you lose weight too.  These foods are lower in fat. Eat MORE  of these:   Dried beans, peas, and lentils.   Egg whites.   Low-fat cottage cheese.   Fish.   Lean cuts of meat, such as round, sirloin, rump, and flank (cut extra fat off meat you fix).   Whole grain breads, cereals and pasta.   Skim and nonfat dry milk.   Low-fat yogurt.   Poultry without the skin.   Cheese made with skim or part-skim milk, such as mozzarella, parmesan, farmers', ricotta, or pot cheese.  These are higher fat foods. Eat LESS of these:   Whole milk and foods made from whole milk, such as American, blue, cheddar, monterey jack, and swiss cheese   High-fat meats, such as luncheon meats, sausages, knockwurst, bratwurst, hot dogs, ribs, corned beef, ground pork, and regular ground beef.   Fried foods.  Limit saturated fats in your diet. Substituting unsaturated fat for saturated fat may decrease your blood triglyceride level. You will need to read package labels to know which products contain saturated fats.  These foods are high in saturated fat. Eat LESS of these:   Fried pork skins.   Whole milk.   Skin and fat from poultry.   Palm oil.   Butter.   Shortening.   Cream cheese.   Bacon.   Margarines and baked goods made from listed oils.   Vegetable shortenings.   Chitterlings.   Fat from meats.   Coconut oil.   Palm kernel oil.   Lard.   Cream.   Sour cream.   Fatback.   Coffee whiteners and non-dairy creamers made with these oils.   Cheese made from whole milk.  Use unsaturated fats (both polyunsaturated and monounsaturated) moderately. Remember, even though unsaturated fats are better than saturated fats; you still want a diet low in total fat.  These foods are high in unsaturated fat:   Canola oil.   Sunflower oil.   Mayonnaise.   Almonds.   Peanuts.   Pine nuts.   Margarines made with these oils.   Safflower oil.   Olive oil.   Avocados.   Cashews.   Peanut butter.   Sunflower seeds.   Soybean oil.     Peanut oil.   Olives.   Pecans.   Walnuts.   Pumpkin seeds.  Avoid sugar and other high-sugar foods. This will decrease carbohydrates without decreasing other nutrients. Sugar in your food goes rapidly to your blood. When there is excess sugar in your blood, your liver may use it to make more triglycerides. Sugar also contains calories without other important nutrients.  Eat LESS of these:   Sugar, brown sugar, powdered sugar, jam, jelly, preserves, honey, syrup, molasses, pies, candy, cakes, cookies, frosting, pastries, colas, soft drinks, punches, fruit drinks, and regular gelatin.   Avoid alcohol. Alcohol, even more than sugar, may increase blood triglycerides. In addition, alcohol is high in calories and low in nutrients. Ask for sparkling water, or a diet soft drink instead of an alcoholic beverage.  Suggestions for planning and preparing meals   Bake, broil, grill or roast meats instead of frying.   Remove fat from meats and skin from poultry before cooking.   Add spices,   herbs, lemon juice or vinegar to vegetables instead of salt, rich sauces or gravies.   Use a non-stick skillet without fat or use no-stick sprays.   Cool and refrigerate stews and broth. Then remove the hardened fat floating on the surface before serving.   Refrigerate meat drippings and skim off fat to make low-fat gravies.   Serve more fish.   Use less butter, margarine and other high-fat spreads on bread or vegetables.   Use skim or reconstituted non-fat dry milk for cooking.   Cook with low-fat cheeses.   Substitute low-fat yogurt or cottage cheese for all or part of the sour cream in recipes for sauces, dips or congealed salads.   Use half yogurt/half mayonnaise in salad recipes.   Substitute evaporated skim milk for cream. Evaporated skim milk or reconstituted non-fat dry milk can be whipped and substituted for whipped cream in certain recipes.   Choose fresh fruits for dessert instead of  high-fat foods such as pies or cakes. Fruits are naturally low in fat.  When Dining Out   Order low-fat appetizers such as fruit or vegetable juice, pasta with vegetables or tomato sauce.   Select clear, rather than cream soups.   Ask that dressings and gravies be served on the side. Then use less of them.   Order foods that are baked, broiled, poached, steamed, stir-fried, or roasted.   Ask for margarine instead of butter, and use only a small amount.   Drink sparkling water, unsweetened tea or coffee, or diet soft drinks instead of alcohol or other sweet beverages.  QUESTIONS AND ANSWERS ABOUT OTHER FATS IN THE BLOOD: SATURATED FAT, TRANS FAT, AND CHOLESTEROL What is trans fat? Trans fat is a type of fat that is formed when vegetable oil is hardened through a process called hydrogenation. This process helps makes foods more solid, gives them shape, and prolongs their shelf life. Trans fats are also called hydrogenated or partially hydrogenated oils.  What do saturated fat, trans fat, and cholesterol in foods have to do with heart disease? Saturated fat, trans fat, and cholesterol in the diet all raise the level of LDL "bad" cholesterol in the blood. The higher the LDL cholesterol, the greater the risk for coronary heart disease (CHD). Saturated fat and trans fat raise LDL similarly.  What foods contain saturated fat, trans fat, and cholesterol? High amounts of saturated fat are found in animal products, such as fatty cuts of meat, chicken skin, and full-fat dairy products like butter, whole milk, cream, and cheese, and in tropical vegetable oils such as palm, palm kernel, and coconut oil. Trans fat is found in some of the same foods as saturated fat, such as vegetable shortening, some margarines (especially hard or stick margarine), crackers, cookies, baked goods, fried foods, salad dressings, and other processed foods made with partially hydrogenated vegetable oils. Small amounts of trans fat  also occur naturally in some animal products, such as milk products, beef, and lamb. Foods high in cholesterol include liver, other organ meats, egg yolks, shrimp, and full-fat dairy products. How can I use the new food label to make heart-healthy food choices? Check the Nutrition Facts panel of the food label. Choose foods lower in saturated fat, trans fat, and cholesterol. For saturated fat and cholesterol, you can also use the Percent Daily Value (%DV): 5% DV or less is low, and 20% DV or more is high. (There is no %DV for trans fat.) Use the Nutrition Facts panel to choose foods low in   saturated fat and cholesterol, and if the trans fat is not listed, read the ingredients and limit products that list shortening or hydrogenated or partially hydrogenated vegetable oil, which tend to be high in trans fat. POINTS TO REMEMBER: YOU NEED A LITTLE TLC (THERAPEUTIC LIFESTYLE CHANGES)  Discuss your risk for heart disease with your caregivers, and take steps to reduce risk factors.   Change your diet. Choose foods that are low in saturated fat, trans fat, and cholesterol.   Add exercise to your daily routine if it is not already being done. Participate in physical activity of moderate intensity, like brisk walking, for at least 30 minutes on most, and preferably all days of the week. No time? Break the 30 minutes into three, 10-minute segments during the day.   Stop smoking. If you do smoke, contact your caregiver to discuss ways in which they can help you quit.   Do not use street drugs.   Maintain a normal weight.   Maintain a healthy blood pressure.   Keep up with your blood work for checking the fats in your blood as directed by your caregiver.  Document Released: 01/10/2004 Document Revised: 12/04/2010 Document Reviewed: 08/07/2008 [facility] Patient Information 2012 [place], [place].Insomnia Insomnia is frequent trouble falling and/or staying asleep. Insomnia can be a long term problem or a short  term problem. Both are common. Insomnia can be a short term problem when the wakefulness is related to a certain stress or worry. Long term insomnia is often related to ongoing stress during waking hours and/or poor sleeping habits. Overtime, sleep deprivation itself can make the problem worse. Every little thing feels more severe because you are overtired and your ability to cope is decreased. CAUSES   Stress, anxiety, and depression.   Poor sleeping habits.   Distractions such as TV in the bedroom.   Naps close to bedtime.   Engaging in emotionally charged conversations before bed.   Technical reading before sleep.   Alcohol and other sedatives. They may make the problem worse. They can hurt normal sleep patterns and normal dream activity.   Stimulants such as caffeine for several hours prior to bedtime.   Pain syndromes and shortness of breath can cause insomnia.   Exercise late at night.   Changing time zones may cause sleeping problems (jet lag).  It is sometimes helpful to have someone observe your sleeping patterns. They should look for periods of not breathing during the night (sleep apnea). They should also look to see how long those periods last. If you live alone or observers are uncertain, you can also be observed at a sleep clinic where your sleep patterns will be professionally monitored. Sleep apnea requires a checkup and treatment. Give your caregivers your medical history. Give your caregivers observations your family has made about your sleep.  SYMPTOMS   Not feeling rested in the morning.   Anxiety and restlessness at bedtime.   Difficulty falling and staying asleep.  TREATMENT   Your caregiver may prescribe treatment for an underlying medical disorders. Your caregiver can give advice or help if you are using alcohol or other drugs for self-medication. Treatment of underlying problems will usually eliminate insomnia problems.   Medications can be prescribed for  short time use. They are generally not recommended for lengthy use.   Over-the-counter sleep medicines are not recommended for lengthy use. They can be habit forming.   You can promote easier sleeping by making lifestyle changes such as:   Using relaxation techniques  that help with breathing and reduce muscle tension.   Exercising earlier in the day.   Changing your diet and the time of your last meal. No night time snacks.   Establish a regular time to go to bed.   Counseling can help with stressful problems and worry.   Soothing music and white noise may be helpful if there are background noises you cannot remove.   Stop tedious detailed work at least one hour before bedtime.  HOME CARE INSTRUCTIONS   Keep a diary. Inform your caregiver about your progress. This includes any medication side effects. See your caregiver regularly. Take note of:   Times when you are asleep.   Times when you are awake during the night.   The quality of your sleep.   How you feel the next day.  This information will help your caregiver care for you.  Get out of bed if you are still awake after 15 minutes. Read or do some quiet activity. Keep the lights down. Wait until you feel sleepy and go back to bed.   Keep regular sleeping and waking hours. Avoid naps.   Exercise regularly.   Avoid distractions at bedtime. Distractions include watching television or engaging in any intense or detailed activity like attempting to balance the household checkbook.   Develop a bedtime ritual. Keep a familiar routine of bathing, brushing your teeth, climbing into bed at the same time each night, listening to soothing music. Routines increase the success of falling to sleep faster.   Use relaxation techniques. This can be using breathing and muscle tension release routines. It can also include visualizing peaceful scenes. You can also help control troubling or intruding thoughts by keeping your mind occupied  with boring or repetitive thoughts like the old concept of counting sheep. You can make it more creative like imagining planting one beautiful flower after another in your backyard garden.   During your day, work to eliminate stress. When this is not possible use some of the previous suggestions to help reduce the anxiety that accompanies stressful situations.  MAKE SURE YOU:   Understand these instructions.   Will watch your condition.   Will get help right away if you are not doing well or get worse.  Document Released: 03/21/2000 Document Revised: 12/04/2010 Document Reviewed: 04/21/2007 [facility] Patient Information 2012 [place], [place].

## 2011-05-20 NOTE — Progress Notes (Signed)
  Subjective:    Patient ID: Justin Goodman, male    DOB: Feb 17, 1956, 56 y.o.   MRN: [id number]  HPI Chronic pain reports hurting all over once a day for pain now. Reports that the tramadol initially worked but is not doing anything for him now. He reports that only the tramadol has failed him on his Neurontin as well. #2 he's not taking anything for his cholesterol he states the statins cause him to hurt is worried about multiple side effects from his medication and has multiple things As far as side effects he is having. #3 insomnia he states that the Desyrel is not working. He states that he takes Desyrel he cannot wake up. Instead he describes it as having sleep walking at night at the place where he stays. #4 reports from some tightness in his chest from the medication and is determined that this was causing this tightness that he has.   Review of Systems    BP 149/78  Pulse 99  Temp(Src) 98.4 F (36.9 C) (Oral)  Wt 185 lb (83.915 kg)  SpO2 96% Objective:   Physical Exam Agitated uncomfortable [place] male. Vital signs shows systolic blood pressure elevated.. Lungs clear cardiovascular S1-S2 neck supple.      Assessment & Plan:  #1 chronic pain. Patient's complain of chronic pain will increase his tramadol since it initially worked from 50 mg 100 mg 3 times a day. Since the Neurontin no longer seems to work increased that from 600 mg at night to 1200 mg daily in a twice a day dosing. Will minister shot of Toradol today for his pain. Also since he apparently did not get his Mobic prescription filled we'll renew Mobic at 15 mg by mouth daily. His gabapentin was increased from 2 300 mg capsules at night to 2 twice a day for a total dose of 1200. #2 hyperlipidemia. Not surprising he did not tolerate statin because of his history of chronic pain. I'm not sure that the statins may situation worse for I will do is place him on WelChol samples given and prescription for 625 given as  well. #3 insomnia since the Desyrel at 50 mg did not do anything for him and that 100 mg he feels he does not have control of his somnolence or sleeping pattern I will switch him to 150 mg in half and take a half of a tablet at night to see if 75 mg might be a better dosing for him. #4 initially he indicated some concerns about blood pressure medicine seems willing now to wait and see how he does.  #5 tobacco abuse he reports having some chest tightness at times and wondered if it's the medication that he has been on. Stressed to him the importance of not smoking and that it is difficult to blame the medication when he continues to smoke. He has a return appointment on March 28.

## 2011-06-23 ENCOUNTER — Other Ambulatory Visit: Payer: Self-pay | Admitting: Family Medicine

## 2011-07-03 ENCOUNTER — Encounter: Payer: Self-pay | Admitting: Family Medicine

## 2011-07-03 ENCOUNTER — Ambulatory Visit: Payer: Medicare HMO | Admitting: Family Medicine

## 2011-07-03 ENCOUNTER — Ambulatory Visit (INDEPENDENT_AMBULATORY_CARE_PROVIDER_SITE_OTHER): Payer: Medicare HMO | Admitting: Family Medicine

## 2011-07-03 VITALS — BP 130/76 | HR 69 | Wt 181.0 lb

## 2011-07-03 DIAGNOSIS — G8929 Other chronic pain: Secondary | ICD-10-CM

## 2011-07-03 DIAGNOSIS — K5900 Constipation, unspecified: Secondary | ICD-10-CM

## 2011-07-03 DIAGNOSIS — I10 Essential (primary) hypertension: Secondary | ICD-10-CM

## 2011-07-03 DIAGNOSIS — E119 Type 2 diabetes mellitus without complications: Secondary | ICD-10-CM

## 2011-07-03 MED ORDER — AMBULATORY NON FORMULARY MEDICATION: Status: DC

## 2011-07-03 MED ORDER — TRAZODONE HCL 150 MG PO TABS: 150.0000 mg | ORAL_TABLET | Freq: Every day | ORAL | Status: DC

## 2011-07-03 MED ORDER — ALBUTEROL SULFATE HFA 108 (90 BASE) MCG/ACT IN AERS: 2.0000 | INHALATION_SPRAY | Freq: Four times a day (QID) | RESPIRATORY_TRACT | Status: DC | PRN

## 2011-07-03 MED ORDER — NICOTINE 21 MG/24HR TD PT24: MEDICATED_PATCH | TRANSDERMAL | Status: DC

## 2011-07-03 MED ORDER — GLIPIZIDE 10 MG PO TABS: 10.0000 mg | ORAL_TABLET | Freq: Two times a day (BID) | ORAL | Status: DC

## 2011-07-03 NOTE — Progress Notes (Signed)
  Subjective:    Patient ID: Justin Goodman, male    DOB: Apr 19, 1955, 56 y.o.   MRN: [id number]  HPI Diabetes-he says his sugars have been doing well. He is going to the gym more regularly. He is doing okay with his diet. He still continues to smoke. He does need a prescription for new glucometer. He says it quit "working. He would like to his mail order pharmacy.  Hypertension-he is taking his medication regularly without any chest pain, shortness of breath, dizziness.  Constipation-he has been very constipated over the last month. He's been taking a stool softener daily and occasionally has to take a laxative. He feels it is probably one of his medications.  Review of Systems     Objective:   Physical Exam  Constitutional: He is oriented to person, place, and time. He appears well-developed and well-nourished.  HENT:  Head: Normocephalic and atraumatic.  Cardiovascular: Normal rate, regular rhythm and normal heart sounds.   Pulmonary/Chest: Effort normal and breath sounds normal.  Neurological: He is alert and oriented to person, place, and time.  Skin: Skin is warm and dry.  Psychiatric: He has a normal mood and affect. His behavior is normal.          Assessment & Plan:  DM - well controlled. At goal. Followup in 3-4 months. The patient prefers to followup in 2 because of other concerns. He declined a pneumonia vaccine today. Lab Results  Component Value Date   HGBA1C 6.1 07/03/2011    Hypertension-well controlled.  Hyperlipidemia-he says he would like to retry the atorvastatin he thought it was causing her pain on the side of his head. It did resolve after he stopped the medication because he is willing to retry it. He was recently put on WelChol instead of a statin because of side effects. Unfortunately I think the WelChol is causing his constipation. I asked him to stop the WelChol for now and retry the atorvastatin.  Constipation-most likely drug related secondary to  WelChol. We'll hold WelChol for now and see if symptoms resolve.

## 2011-07-07 LAB — LIPID PANEL
Cholesterol: 107 mg/dL (ref 0–200)
HDL: 25 mg/dL — ABNORMAL LOW (ref >39)
LDL Cholesterol: 45 mg/dL (ref 0–99)
Total CHOL/HDL Ratio: 4.3 Ratio
Triglycerides: 183 mg/dL — ABNORMAL HIGH (ref <150)
VLDL: 37 mg/dL (ref 0–40)

## 2011-08-19 ENCOUNTER — Other Ambulatory Visit: Payer: Self-pay | Admitting: Family Medicine

## 2011-09-02 ENCOUNTER — Telehealth: Payer: Self-pay | Admitting: *Deleted

## 2011-09-02 DIAGNOSIS — E785 Hyperlipidemia, unspecified: Secondary | ICD-10-CM

## 2011-09-02 DIAGNOSIS — I10 Essential (primary) hypertension: Secondary | ICD-10-CM

## 2011-09-02 NOTE — Telephone Encounter (Signed)
Pt calls and wants to get lab order sent to lab. States its for his diabetes. He has appt with you on Thursday.

## 2011-09-02 NOTE — Telephone Encounter (Signed)
Check TG and BMP.

## 2011-09-02 NOTE — Telephone Encounter (Signed)
Lab order entered and faxed. KG LPN

## 2011-09-04 ENCOUNTER — Ambulatory Visit: Payer: Medicare HMO | Admitting: Family Medicine

## 2011-09-08 ENCOUNTER — Encounter: Payer: Self-pay | Admitting: Family Medicine

## 2011-09-08 ENCOUNTER — Ambulatory Visit (INDEPENDENT_AMBULATORY_CARE_PROVIDER_SITE_OTHER): Payer: Medicare HMO | Admitting: Family Medicine

## 2011-09-08 VITALS — BP 120/66 | HR 83 | Ht 69.0 in | Wt 179.0 lb

## 2011-09-08 DIAGNOSIS — I10 Essential (primary) hypertension: Secondary | ICD-10-CM

## 2011-09-08 DIAGNOSIS — G8929 Other chronic pain: Secondary | ICD-10-CM

## 2011-09-08 DIAGNOSIS — E785 Hyperlipidemia, unspecified: Secondary | ICD-10-CM

## 2011-09-08 DIAGNOSIS — E119 Type 2 diabetes mellitus without complications: Secondary | ICD-10-CM

## 2011-09-08 MED ORDER — GABAPENTIN 600 MG PO TABS: 600.0000 mg | ORAL_TABLET | Freq: Three times a day (TID) | ORAL | Status: DC

## 2011-09-08 MED ORDER — INSULIN GLARGINE 100 UNIT/ML ~~LOC~~ SOLN: 42.0000 [IU] | Freq: Every day | SUBCUTANEOUS | Status: DC

## 2011-09-08 MED ORDER — GLIMEPIRIDE 4 MG PO TABS: 4.0000 mg | ORAL_TABLET | Freq: Every day | ORAL | Status: DC

## 2011-09-08 NOTE — Progress Notes (Signed)
  Subjective:    Patient ID: Justin Goodman, male    DOB: 1955-04-10, 56 y.o.   MRN: [id number]  HPI Lipids - Restarted his atorvastain.  He felt strongly have been causing some headaches that he had stopped it. I encouraged him to at least retry it. The headaches are back. Unfortunately the WelChol was too constipating.  DM- Sugars are well controlled.  Doing well. He has been working out and has lost a couple of lbs in hopes of coming off his meds. He has been exercising regularly. He still feels fatigued.   Review of Systems     Objective:   Physical Exam  Constitutional: He is oriented to person, place, and time. He appears well-developed and well-nourished.  HENT:  Head: Normocephalic and atraumatic.  Cardiovascular: Normal rate, regular rhythm and normal heart sounds.   Pulmonary/Chest: Effort normal and breath sounds normal.  Neurological: He is alert and oriented to person, place, and time.  Skin: Skin is warm and dry.  Psychiatric: He has a normal mood and affect. His behavior is normal.          Assessment & Plan:  Lipids - given samples of Livalo 2 mg to take daily for one month until I see him back. Hopefully he will tolerate this better than the atorvastatin. Also consider Crestor as an option.  Hypertension-well-controlled at goal today. Continue current regimen.  Diabetes-he is a little too early to recheck his A1c today. He is doing fantastic. I'm hoping that we may then be able to cut his insulin half when I see him again in about 30 days. He to work on regular exercise and diet. Continue Amaryl, metformin, Lantus. He does need one month supply on the Lantus. Lab Results  Component Value Date   HGBA1C 6.1 07/03/2011

## 2011-09-08 NOTE — Patient Instructions (Signed)
Start livalo 2mg  at bedtime.

## 2011-09-23 ENCOUNTER — Other Ambulatory Visit: Payer: Self-pay | Admitting: Family Medicine

## 2011-10-14 ENCOUNTER — Ambulatory Visit (INDEPENDENT_AMBULATORY_CARE_PROVIDER_SITE_OTHER): Payer: Medicare HMO | Admitting: Family Medicine

## 2011-10-14 ENCOUNTER — Encounter: Payer: Self-pay | Admitting: Family Medicine

## 2011-10-14 VITALS — BP 118/69 | HR 87 | Ht 69.0 in | Wt 175.0 lb

## 2011-10-14 DIAGNOSIS — E119 Type 2 diabetes mellitus without complications: Secondary | ICD-10-CM

## 2011-10-14 DIAGNOSIS — Z720 Tobacco use: Secondary | ICD-10-CM

## 2011-10-14 DIAGNOSIS — F17200 Nicotine dependence, unspecified, uncomplicated: Secondary | ICD-10-CM

## 2011-10-14 NOTE — Patient Instructions (Signed)
You Can Quit Smoking       If you are ready to quit smoking or are thinking about it, congratulations! You have chosen to help yourself be healthier and live longer! There are lots of different ways to quit smoking. Nicotine gum, nicotine patches, a nicotine inhaler, or nicotine nasal spray can help with physical craving. Hypnosis, support groups, and medicines help break the habit of smoking.    TIPS TO GET OFF AND STAY OFF CIGARETTES     . Learn to predict your moods. Do not let a bad situation be your excuse to have a cigarette. Some situations in your life might tempt you to have a cigarette.   . Ask friends and co-workers not to smoke around you.   . Make your home smoke-free.   . Never have "just one" cigarette. It leads to wanting another and another. Remind yourself of your decision to quit.   . On a card, make a list of your reasons for not smoking. Read it at least the same number of times a day as you have a cigarette. Tell yourself everyday, "I do not want to smoke. I choose not to smoke."   . Ask someone at home or work to help you with your plan to quit smoking.   . Have something planned after you eat or have a cup of coffee. Take a walk or get other exercise to perk you up. This will help to keep you from overeating.   . Try a relaxation exercise to calm you down and decrease your stress. Remember, you may be tense and nervous the first two weeks after you quit. This will pass.   . Find new activities to keep your hands busy. Play with a pen, coin, or rubber band. Doodle or draw things on paper.   . Brush your teeth right after eating. This will help cut down the craving for the taste of tobacco after meals. You can try mouthwash too.   . Try gum, breath mints, or diet candy to keep something in your mouth.    IF YOU SMOKE AND WANT TO QUIT:     . Do not stock up on cigarettes. Never buy a carton. Wait until one pack is finished before you buy another.   . Never carry cigarettes with you at work or at  home.   . Keep cigarettes as far away from you as possible. Leave them with someone else.   . Never carry matches or a lighter with you.   . Ask yourself, "Do I need this cigarette or is this just a reflex?"   . Bet with someone that you can quit. Put cigarette money in a piggy bank every morning. If you smoke, you give up the money. If you do not smoke, by the end of the week, you keep the money.   . Keep trying. It takes 21 days to change a habit!   . Talk to your doctor about using medicines to help you quit. These include nicotine replacement gum, lozenges, or skin patches.      Document Released: 01/18/2009 Document Revised: 03/13/2011 Document Reviewed: 01/18/2009   [employer] Patient Information 2012 [employer].

## 2011-10-14 NOTE — Progress Notes (Signed)
  Subjective:    Patient ID: Justin Goodman, male    DOB: 01-30-1956, 56 y.o.   MRN: [id number]  Diabetes He presents for his follow-up diabetic visit. He has type 2 diabetes mellitus. His disease course has been stable. There are no hypoglycemic associated symptoms. Pertinent negatives for diabetes include no blurred vision, no foot paresthesias, no foot ulcerations, no polyphagia, no polyuria and no visual change. There are no hypoglycemic complications. Symptoms are stable. He is compliant with treatment all of the time. He is following a generally healthy diet. He has not had a previous visit with a dietician. He participates in exercise daily. There is no change in his home blood glucose trend. His breakfast blood glucose range is generally 130-140 mg/dl. An ACE inhibitor/angiotensin II receptor blocker is being taken.      Review of Systems  Eyes: Negative for blurred vision.  Genitourinary: Negative for polyuria.  Hematological: Negative for polyphagia.       Objective:   Physical Exam  Constitutional: He is oriented to person, place, and time. He appears well-developed and well-nourished.  HENT:  Head: Normocephalic and atraumatic.  Cardiovascular: Normal rate, regular rhythm and normal heart sounds.   Pulmonary/Chest: Effort normal and breath sounds normal.  Neurological: He is alert and oriented to person, place, and time.  Skin: Skin is warm and dry.  Psychiatric: He has a normal mood and affect. His behavior is normal.          Assessment & Plan:  DM- well controlled. Continue current regimen. He would like to follow up in 2 months instead of 3 months. He has lost a couple more pounds which I think is fantastic. His been working out regularly. He says he is also down to 6 cigarettes per day and I congratulated him on this. He did try to get the nicotine patches but says that he had an over-the-counter and it was the most $40 he decided not to spend the money. I also  reminded him that he is due for a diabetic eye exam it has been a year. He thought he only had it every other year but I reminded him its once a year. He is due for urine microalbumin that was unable to give a urine sample today. We'll try to catch this at the next office visit.  Tobacco abuse- Please see above.

## 2011-10-23 ENCOUNTER — Other Ambulatory Visit: Payer: Self-pay | Admitting: Family Medicine

## 2011-10-31 ENCOUNTER — Other Ambulatory Visit: Payer: Self-pay | Admitting: *Deleted

## 2011-11-03 MED ORDER — INSULIN GLARGINE 100 UNIT/ML ~~LOC~~ SOLN: 30.0000 [IU] | Freq: Every day | SUBCUTANEOUS | Status: DC

## 2011-11-03 NOTE — Telephone Encounter (Signed)
Pt called for refill of insulin;pt advised to please call pharm for refill request. Refill sent to pharm

## 2011-11-06 ENCOUNTER — Other Ambulatory Visit: Payer: Self-pay | Admitting: *Deleted

## 2011-11-06 MED ORDER — INSULIN GLARGINE 100 UNIT/ML ~~LOC~~ SOLN: 42.0000 [IU] | Freq: Every day | SUBCUTANEOUS | Status: DC

## 2011-11-11 ENCOUNTER — Other Ambulatory Visit: Payer: Self-pay | Admitting: Family Medicine

## 2011-12-09 ENCOUNTER — Telehealth: Payer: Self-pay | Admitting: *Deleted

## 2011-12-09 NOTE — Telephone Encounter (Signed)
Pt called and states last summer her wanted his tramadol rx increased from from 50mg  to 100mg  but he states he was told it didn't come in a higher dose.I know that he was on this med at one point but I dont even see it on his current med list or hx med list.Please advise.acm

## 2011-12-09 NOTE — Telephone Encounter (Signed)
We can change his 50mg  dose  to TID if he would like or change to 100mg  BID? Let me know what he would like to do.

## 2011-12-10 MED ORDER — TRAMADOL HCL (ER BIPHASIC) 100 MG PO CP24: 1.0000 | ORAL_CAPSULE | Freq: Every day | ORAL | Status: DC

## 2011-12-10 NOTE — Telephone Encounter (Signed)
rx sent to [facility] so he can try it.  The 100mg  (is an extended release product).

## 2011-12-10 NOTE — Telephone Encounter (Signed)
Pt would like 100 mg BID

## 2011-12-12 ENCOUNTER — Telehealth: Payer: Self-pay | Admitting: Family Medicine

## 2011-12-12 MED ORDER — TRAMADOL HCL 50 MG PO TABS: 50.0000 mg | ORAL_TABLET | Freq: Three times a day (TID) | ORAL | Status: DC | PRN

## 2011-12-12 NOTE — Telephone Encounter (Signed)
Will change back to 50mg  tab but will increase to TID prn.

## 2011-12-12 NOTE — Telephone Encounter (Signed)
Tramadol 100 mg not covered under Insurance plan  Needs cheaper medication

## 2011-12-12 NOTE — Telephone Encounter (Signed)
Left message  For patient to check with pharm on medication refill

## 2011-12-15 ENCOUNTER — Ambulatory Visit: Payer: Medicare HMO | Admitting: Family Medicine

## 2011-12-16 ENCOUNTER — Other Ambulatory Visit: Payer: Self-pay | Admitting: Family Medicine

## 2012-01-15 ENCOUNTER — Ambulatory Visit (INDEPENDENT_AMBULATORY_CARE_PROVIDER_SITE_OTHER): Payer: Medicare HMO | Admitting: Family Medicine

## 2012-01-15 ENCOUNTER — Encounter: Payer: Self-pay | Admitting: Family Medicine

## 2012-01-15 VITALS — BP 122/80 | HR 86 | Ht 69.0 in | Wt 180.0 lb

## 2012-01-15 DIAGNOSIS — I10 Essential (primary) hypertension: Secondary | ICD-10-CM

## 2012-01-15 DIAGNOSIS — E119 Type 2 diabetes mellitus without complications: Secondary | ICD-10-CM

## 2012-01-15 DIAGNOSIS — J329 Chronic sinusitis, unspecified: Secondary | ICD-10-CM

## 2012-01-15 DIAGNOSIS — M79603 Pain in arm, unspecified: Secondary | ICD-10-CM

## 2012-01-15 DIAGNOSIS — M79609 Pain in unspecified limb: Secondary | ICD-10-CM

## 2012-01-15 LAB — POCT UA - MICROALBUMIN
Creatinine, POC: 50 mg/dL
Microalbumin Ur, POC: 30 mg/dL

## 2012-01-15 MED ORDER — AMOXICILLIN-POT CLAVULANATE 875-125 MG PO TABS: 1.0000 | ORAL_TABLET | Freq: Two times a day (BID) | ORAL | Status: DC

## 2012-01-15 NOTE — Patient Instructions (Addendum)

## 2012-01-15 NOTE — Progress Notes (Signed)
  Subjective:    Patient ID: Justin Goodman, male    DOB: [birth year], 56 y.o.   MRN: [id number]  HPI Has chronic neck and low back pain. Pain into each arms. Arms has been hurting for 2 weeks.  Both legs are hurting as well. Says the tramadol is no longer helping.   DM- well controlled sugars at home.  Hypoglycemic events.  Has been eating well. 30 days average is 164, 14 day average is 160, 7 days is 164   Sinus pain and congestion for 2 weks. Worse on the left side of face and left ear feels full. No fever. No short of breath. The he has noticed a little wheezing in his chest. He wonders if he could have a lot of wax in his left ear. He hasn't felt well. He's not taking any over-the-counter cough or cold medications. He feels he is not getting any better.  Hypertension-no chest pain or shortness of breath. Tolerating medications well without side effects. Review of Systems     Objective:   Physical Exam  Constitutional: He is oriented to person, place, and time. He appears well-developed and well-nourished.  HENT:  Head: Normocephalic and atraumatic.  Right Ear: External ear normal.  Left Ear: External ear normal.  Nose: Nose normal.  Mouth/Throat: Oropharynx is clear and moist.       TMs and canals are clear. + left facial tenderness.    Eyes: Conjunctivae normal and EOM are normal. Pupils are equal, round, and reactive to light.  Neck: Neck supple. No thyromegaly present.  Cardiovascular: Normal rate and normal heart sounds.   Pulmonary/Chest: Effort normal and breath sounds normal.  Lymphadenopathy:    He has no cervical adenopathy.  Neurological: He is alert and oriented to person, place, and time.  Skin: Skin is warm and dry.  Psychiatric: He has a normal mood and affect.          Assessment & Plan:  DM- A1C is 6.9, well-controlled. Continue current regimen and followup in 3 months he is on fantastic. He is on a little bit of protein in his urine. He's on quinapril.  Exam performed today. He declines the flu vaccine.  Sinusitis-acute. Will treat with Augmentin. Call if not significantly better in one week.  Arm and leg pain. I suspect this is most likely coming from his spine. He has not seen an orthopedist and a long time. I would like to have him see Dr. Benjamin Stain to evaluate him further. I would really like to look at alternatives besides just increasing his pain medications. Maybe physical therapy, injection, et Karie Soda.  Hypertension-well-controlled. Continue current regimen. Followup in 3 months. Her

## 2012-01-16 ENCOUNTER — Ambulatory Visit (INDEPENDENT_AMBULATORY_CARE_PROVIDER_SITE_OTHER): Payer: Medicare HMO

## 2012-01-16 ENCOUNTER — Ambulatory Visit (INDEPENDENT_AMBULATORY_CARE_PROVIDER_SITE_OTHER): Payer: Medicare HMO | Admitting: Sports Medicine

## 2012-01-16 ENCOUNTER — Encounter: Payer: Self-pay | Admitting: Sports Medicine

## 2012-01-16 VITALS — BP 133/80 | HR 88 | Wt 179.0 lb

## 2012-01-16 DIAGNOSIS — M545 Low back pain, unspecified: Secondary | ICD-10-CM

## 2012-01-16 DIAGNOSIS — IMO0002 Reserved for concepts with insufficient information to code with codable children: Secondary | ICD-10-CM

## 2012-01-16 MED ORDER — PREDNISONE 50 MG PO TABS: ORAL_TABLET | ORAL | Status: DC

## 2012-01-16 MED ORDER — AMITRIPTYLINE HCL 50 MG PO TABS: ORAL_TABLET | ORAL | Status: DC

## 2012-01-16 NOTE — Progress Notes (Signed)
Subjective:    I'm seeing this patient as a consultation for:  Dr. Linford Arnold.  CC: Leg pain, arm pain.  HPI: Justin Goodman is a very pleasant and interesting 56 year old male comes in with a long history of pain that he localizes over his entire body. He notes that it is worse over both legs, over the entire leg from the thigh down to the toes, anterior, and posterior aspects. It's not worse with any particular movement, but is essentially stable. He denies any trauma, notes the pain is not worse with Valsalva, he has been on gabapentin 600 mg 3 times a day which is ineffective. He also has pain that he localizes over his entire upper back, going down both arms to the hands, but without numbness, or tingling.  Past medical history, Surgical history, Family history, Social history, Allergies, and medications have been entered into the medical record, reviewed, and no changes needed.   Review of Systems: No headache, visual changes, nausea, vomiting, diarrhea, constipation, dizziness, abdominal pain, skin rash, fevers, chills, night sweats, weight loss, swollen lymph nodes, body aches, joint swelling, muscle aches, chest pain, or shortness of breath.   Objective:   Vitals:  Afebrile, vital signs stable. General: Well Developed, well nourished, and in no acute distress.  Neuro/Psych: Alert and oriented x3, extra-ocular muscles intact, able to move all 4 extremities.  Skin: Warm and dry, no rashes noted.  Respiratory: Not using accessory muscles, speaking in full sentences, trachea midline.  Cardiovascular: Pulses palpable, no extremity edema. Abdomen: Does not appear distended. Left and right Shoulder: Inspection reveals no abnormalities, atrophy or asymmetry. There are multiple discrete areas of tenderness to palpation over both shoulders, trapezius muscles, paraspinal muscles, and mid back. ROM is full in all planes. Rotator cuff strength normal throughout. No signs of impingement with negative  Neer and Hawkin's tests, empty can sign. Speeds and Yergason's tests normal. No labral pathology noted with negative Obrien's, negative clunk and good stability. Normal scapular function observed. No painful arc and no drop arm sign. No apprehension sign  Back Exam:  Inspection: Unremarkable  Motion: Flexion 45 deg, Extension 45 deg, Side Bending to 45 deg bilaterally,  Rotation to 45 deg bilaterally  SLR laying: Negative  XSLR laying: Negative  Palpable tenderness: There are multiple discrete areas of tenderness to palpation over his lower back, hips, thighs, and legs. FABER: negative. Sensory change: Gross sensation intact to all lumbar and sacral dermatomes.  Reflexes: 2+ at both patellar tendons, 2+ at achilles tendons, Babinski's downgoing.  Strength at foot  Plantar-flexion: 5/5 Dorsi-flexion: 5/5 Eversion: 5/5 Inversion: 5/5  Leg strength  Quad: 5/5 Hamstring: 5/5 Hip flexor: 5/5 Hip abductors: 5/5  Gait unremarkable.  Impression and Recommendations:   This case required medical decision making of moderate complexity.

## 2012-01-16 NOTE — Assessment & Plan Note (Addendum)
Justin Goodman has a constellation of symptoms that are minimally worrisome for lumbar radiculopathy, but also myofascial pain syndrome. Going to start conservatively, with prednisone, amitriptyline at bedtime, I'm also going to x-rays lumbar spine, and have him do some home rehabilitation exercises. I would like to see him back in 2 weeks to see if he's doing. The patient did request a two-week followup rather than my usual 4 weeks.

## 2012-02-02 ENCOUNTER — Ambulatory Visit (INDEPENDENT_AMBULATORY_CARE_PROVIDER_SITE_OTHER): Payer: Medicare HMO | Admitting: Sports Medicine

## 2012-02-02 ENCOUNTER — Encounter: Payer: Self-pay | Admitting: Sports Medicine

## 2012-02-02 ENCOUNTER — Encounter: Payer: Self-pay | Admitting: *Deleted

## 2012-02-02 VITALS — BP 132/76 | HR 87 | Wt 180.0 lb

## 2012-02-02 DIAGNOSIS — M545 Low back pain, unspecified: Secondary | ICD-10-CM

## 2012-02-02 DIAGNOSIS — IMO0001 Reserved for inherently not codable concepts without codable children: Secondary | ICD-10-CM

## 2012-02-02 MED ORDER — AMITRIPTYLINE HCL 75 MG PO TABS: 75.0000 mg | ORAL_TABLET | Freq: Every day | ORAL | Status: DC

## 2012-02-02 NOTE — Assessment & Plan Note (Addendum)
He does have L5-S1 facet arthrosis, the widespread nature of his pain points more towards a myofascial pain syndrome. Gabapentin has been moderately ineffective at the 600 mg 3 times a day dose. He may increase to 1200 mg 3 times a day. Have improved approximately 75% with amitriptyline 50 mg at bedtime. I performed 4 trigger point injections today. He will continue on rehabilitation exercises. He may stop his Mobic, and tramadol as these are ineffective. I'm going to increase the amitriptyline to 75 mg at bedtime.  Once we get to the point of having maxed out his medications, I am going to re MRI his lumbar spine for facet joint injection planning.

## 2012-02-02 NOTE — Progress Notes (Signed)
Subjective:     CC: Followup back pain  HPI: Russel returns to see me for followup of his back pain, as well as generalized pain symptoms. To recap, he is been on gabapentin 600 mg 3 times a day, tramadol, and Mobic. This wasn't effective, so I started him on amitriptyline 50 mg at bedtime, I also placed on a burst of prednisone. He returns today approximately 75% better overall, but did have a flare recently. Again, he localizes his pain in his midline mid to low back. He also notes pain in both shoulders, as well as his right arm.  Past medical history, Surgical history, Family history, Social history, Allergies, and medications have been entered into the medical record, reviewed, and no changes needed.   Review of Systems: No headache, visual changes, nausea, vomiting, diarrhea, constipation, dizziness, abdominal pain, skin rash, fevers, chills, night sweats, weight loss, swollen lymph nodes, body aches, joint swelling, muscle aches, chest pain, or shortness of breath.   Objective:   Vitals:  Afebrile, vital signs stable. General: Well Developed, well nourished, and in no acute distress.  Neuro/Psych: Alert and oriented x3, extra-ocular muscles intact, able to move all 4 extremities.  Skin: Warm and dry, no rashes noted.  Respiratory: Not using accessory muscles, speaking in full sentences, trachea midline.  Cardiovascular: Pulses palpable, no extremity edema. Abdomen: Does not appear distended. Back Exam:  Inspection: Unremarkable  Motion: Flexion 45 deg, Extension 45 deg, Side Bending to 45 deg bilaterally,  Rotation to 45 deg bilaterally  SLR laying: Negative  XSLR laying: Negative  Palpable tenderness: Left and right paralumbar muscles, palpable spasm.Marland Kitchen FABER: negative. Sensory change: Gross sensation intact to all lumbar and sacral dermatomes.  Reflexes: 2+ at both patellar tendons, 2+ at achilles tendons, Babinski's downgoing.  Strength at foot  Plantar-flexion: 5/5  Dorsi-flexion: 5/5 Eversion: 5/5 Inversion: 5/5  Leg strength  Quad: 5/5 Hamstring: 5/5 Hip flexor: 5/5 Hip abductors: 5/5  Gait unremarkable.  Procedure:  Injection of trigger points along the paralumbar muscles Consent obtained and verified. Time-out conducted. Noted no overlying erythema, induration, or other signs of local infection. Skin prepped in a sterile fashion. Topical analgesic spray: Ethyl chloride. Completed without difficulty. Meds: 1/2 cc Kenalog 40, 1 cc lidocaine injected in a fanlike pattern into 4 discrete palpable, painful trigger points along the lumbar spine. Pain immediately improved suggesting accurate placement of the medication. Advised to call if fevers/chills, erythema, induration, drainage, or persistent bleeding.  I reviewed his x-rays myself, they show fairly well-maintained disc heights, he does have bilateral L5-S1 facet joint arthrosis.  Impression and Recommendations:   This case required medical decision making of moderate complexity.

## 2012-02-20 ENCOUNTER — Other Ambulatory Visit: Payer: Self-pay | Admitting: Family Medicine

## 2012-03-15 ENCOUNTER — Ambulatory Visit (INDEPENDENT_AMBULATORY_CARE_PROVIDER_SITE_OTHER): Payer: Medicare HMO | Admitting: Sports Medicine

## 2012-03-15 VITALS — BP 130/76 | HR 94 | Wt 178.0 lb

## 2012-03-15 DIAGNOSIS — N529 Male erectile dysfunction, unspecified: Secondary | ICD-10-CM

## 2012-03-15 DIAGNOSIS — M545 Low back pain, unspecified: Secondary | ICD-10-CM

## 2012-03-15 DIAGNOSIS — G589 Mononeuropathy, unspecified: Secondary | ICD-10-CM

## 2012-03-15 DIAGNOSIS — E1169 Type 2 diabetes mellitus with other specified complication: Secondary | ICD-10-CM

## 2012-03-15 DIAGNOSIS — N521 Erectile dysfunction due to diseases classified elsewhere: Secondary | ICD-10-CM | POA: Insufficient documentation

## 2012-03-15 MED ORDER — DULOXETINE HCL 60 MG PO CPEP: 60.0000 mg | ORAL_CAPSULE | Freq: Every day | ORAL | Status: DC

## 2012-03-15 NOTE — Assessment & Plan Note (Signed)
He desires not to discuss this with a male provider. I think that this is appropriate. He will continue all preventive care with Dr. Linford Arnold, and I'm happy to discuss erectile dysfunction with him. He will make a followup visit for this.

## 2012-03-15 NOTE — Assessment & Plan Note (Addendum)
Back pain is completely resolved with amitriptyline 50 mg (he decreased from 75 this was making him too groggy.) Continue gabapentin at the max dose. He has declined pursuing MRI, the co-pay is very high. With continued neuropathic pain, I would like to try Cymbalta. This may represent painful diabetic neuropathy, however his diabetes does sound very well controlled. I will give him a month of samples. We'll see him back in one month.

## 2012-03-15 NOTE — Progress Notes (Signed)
SPORTS MEDICINE CONSULTATION REPORT  Subjective:    CC: Followup  HPI: Low back pain: We have tried various medication regimens, currently he has been on 75 mg of amitriptyline and maximal 3600 mg daily of gabapentin. He notes that his back pain is now completely resolved. He did have some grogginess and 75 mg of amitriptyline and has thus dropped his back down to 50 mg each bedtime.  Burning in feet: This is not been affected or improved by gabapentin, or amitriptyline. He is a diabetic, but his control is good.  Erectile dysfunction: Stopped the at the end of the visit to discuss this, he does not feel comfortable discussing this with a male provider, and agrees to discuss this with me at a future visit.  Past medical history, Surgical history, Family history, Social history, Allergies, and medications have been entered into the medical record, reviewed, and no changes needed.   Review of Systems: No headache, visual changes, nausea, vomiting, diarrhea, constipation, dizziness, abdominal pain, skin rash, fevers, chills, night sweats, weight loss, swollen lymph nodes, body aches, joint swelling, muscle aches, chest pain, shortness of breath, mood changes, visual or auditory hallucinations.   Objective:   Vitals:  Afebrile, vital signs stable. General: Well Developed, well nourished, and in no acute distress.  Neuro/Psych: Alert and oriented x3, extra-ocular muscles intact, able to move all 4 extremities.  Skin: Warm and dry, no rashes noted.  Respiratory: Not using accessory muscles, speaking in full sentences, trachea midline.  Cardiovascular: Pulses palpable, no extremity edema. Abdomen: Does not appear distended. Back Exam:  Inspection: Unremarkable  Motion: Flexion 45 deg, Extension 45 deg, Side Bending to 45 deg bilaterally,  Rotation to 45 deg bilaterally  SLR laying: Negative  XSLR laying: Negative  Palpable tenderness: None. FABER: negative. Sensory change: Gross sensation  intact to all lumbar and sacral dermatomes.  Reflexes: 2+ at both patellar tendons, 2+ at achilles tendons, Babinski's downgoing.  Strength at foot  Plantar-flexion: 5/5 Dorsi-flexion: 5/5 Eversion: 5/5 Inversion: 5/5  Leg strength  Quad: 5/5 Hamstring: 5/5 Hip flexor: 5/5 Hip abductors: 5/5  Gait unremarkable.   Impression and Recommendations:   This case required medical decision making of moderate complexity.

## 2012-03-15 NOTE — Assessment & Plan Note (Signed)
May be due to diabetes mellitus. I would like to further investigate his lumbar spine before attributing this to diabetes. As below, he does note that he would be unable to afford the copayment for an MRI, $400. I will add duloxetine to his current medication regimen.

## 2012-04-09 ENCOUNTER — Other Ambulatory Visit: Payer: Self-pay | Admitting: *Deleted

## 2012-04-09 MED ORDER — TRAZODONE HCL 150 MG PO TABS: 150.0000 mg | ORAL_TABLET | Freq: Every day | ORAL | Status: DC

## 2012-04-12 ENCOUNTER — Encounter: Payer: Self-pay | Admitting: Sports Medicine

## 2012-04-12 ENCOUNTER — Other Ambulatory Visit: Payer: Self-pay | Admitting: *Deleted

## 2012-04-12 ENCOUNTER — Ambulatory Visit (INDEPENDENT_AMBULATORY_CARE_PROVIDER_SITE_OTHER): Payer: Medicare Other | Admitting: Sports Medicine

## 2012-04-12 VITALS — BP 122/67 | HR 87 | Ht 69.0 in | Wt 181.0 lb

## 2012-04-12 DIAGNOSIS — N529 Male erectile dysfunction, unspecified: Secondary | ICD-10-CM

## 2012-04-12 DIAGNOSIS — M545 Low back pain, unspecified: Secondary | ICD-10-CM

## 2012-04-12 DIAGNOSIS — E1169 Type 2 diabetes mellitus with other specified complication: Secondary | ICD-10-CM

## 2012-04-12 LAB — COMPREHENSIVE METABOLIC PANEL WITH GFR
ALT: 18 U/L (ref 0–53)
AST: 17 U/L (ref 0–37)
Albumin: 4.6 g/dL (ref 3.5–5.2)
Alkaline Phosphatase: 40 U/L (ref 39–117)
Glucose, Bld: 283 mg/dL — ABNORMAL HIGH (ref 70–99)
Potassium: 4.5 meq/L (ref 3.5–5.3)
Sodium: 134 meq/L — ABNORMAL LOW (ref 135–145)
Total Bilirubin: 0.4 mg/dL (ref 0.3–1.2)
Total Protein: 6.9 g/dL (ref 6.0–8.3)

## 2012-04-12 LAB — COMPREHENSIVE METABOLIC PANEL
BUN: 15 mg/dL (ref 6–23)
CO2: 28 mEq/L (ref 19–32)
Calcium: 9.8 mg/dL (ref 8.4–10.5)
Chloride: 100 mEq/L (ref 96–112)
Creat: 0.74 mg/dL (ref 0.50–1.35)

## 2012-04-12 LAB — PSA, TOTAL AND FREE
PSA, Free Pct: 52 % (ref >25)
PSA, Free: 0.12 ng/mL
PSA: 0.23 ng/mL (ref <=4.00)

## 2012-04-12 MED ORDER — TRAZODONE HCL 150 MG PO TABS: 150.0000 mg | ORAL_TABLET | Freq: Every day | ORAL | Status: DC

## 2012-04-12 NOTE — Progress Notes (Signed)
Subjective:    CC: Follow up  HPI: Myofascial pain: Justin Goodman initially had an excellent response to amitriptyline as well as a gabapentin up taper at the maximum dose. He did not note any benefit to Cymbalta. Unfortunately, he said a recurrence of this pain he localizes all over his legs from the waist down to the feet. Is wondering if anything else can be done.  Erectile dysfunction: Does have well controlled diabetes, but has tried every phosphokinase to raise 5 inhibitor. He does recall doing corpus cavernosum injections in the past, and desires to restart this. Again, he was uncomfortable discussing this with his primary care physician.  Past medical history, Surgical history, Family history, Social history, Allergies, and medications have been entered into the medical record, reviewed, and no changes needed.   Review of Systems: No fevers, chills, night sweats, weight loss, chest pain, or shortness of breath.   Objective:    General: Well Developed, well nourished, and in no acute distress.  Neuro: Alert and oriented x3, extra-ocular muscles intact.  HEENT: Normocephalic, atraumatic, pupils equal round reactive to light, neck supple, no masses, no lymphadenopathy, thyroid nonpalpable.  Skin: Warm and dry, no rashes. Cardiac: Regular rate and rhythm, no murmurs rubs or gallops.  Respiratory: Clear to auscultation bilaterally. Not using accessory muscles, speaking in full sentences. Back Exam:  Inspection: Unremarkable  Motion: Flexion 45 deg, Extension 45 deg, Side Bending to 45 deg bilaterally,  Rotation to 45 deg bilaterally  SLR laying: Negative  XSLR laying: Negative  Palpable tenderness: None. FABER: negative. Sensory change: Gross sensation intact to all lumbar and sacral dermatomes.  Reflexes: 2+ at both patellar tendons, 2+ at achilles tendons, Babinski's downgoing.  Strength at foot  Plantar-flexion: 5/5 Dorsi-flexion: 5/5 Eversion: 5/5 Inversion: 5/5  Leg strength    Quad: 5/5 Hamstring: 5/5 Hip flexor: 5/5 Hip abductors: 5/5  Gait unremarkable.   Impression and Recommendations:

## 2012-04-12 NOTE — Assessment & Plan Note (Addendum)
Symptoms were moderately well controlled with amitriptyline 50 mg at bedtime, gabapentin 1200 mg 3 times a day. Symptoms have returned, we will try Savella I have given him the titration pack.  Of note, he continues to decline MRI. He should come back to see me in a couple weeks to see how things are going.

## 2012-04-12 NOTE — Assessment & Plan Note (Signed)
I would like him to see a urologist. It does sound as though he was using prostaglandin injections into the corpus cavernosum. I did advise her to have no experience prescribing this, but they are known to be highly effective, so I will give him a referral to urology. In the meantime, I will also check his testosterone levels.

## 2012-04-13 LAB — TESTOSTERONE, FREE, TOTAL, SHBG
Sex Hormone Binding: 52 nmol/L (ref 13–71)
Testosterone, Free: 90.5 pg/mL (ref 47.0–244.0)
Testosterone-% Free: 1.6 % (ref 1.6–2.9)
Testosterone: 570.7 ng/dL (ref 300–890)

## 2012-04-16 ENCOUNTER — Ambulatory Visit (INDEPENDENT_AMBULATORY_CARE_PROVIDER_SITE_OTHER): Payer: Medicare Other | Admitting: Family Medicine

## 2012-04-16 ENCOUNTER — Encounter: Payer: Self-pay | Admitting: Family Medicine

## 2012-04-16 VITALS — BP 125/69 | HR 90 | Resp 16 | Wt 181.0 lb

## 2012-04-16 DIAGNOSIS — Z720 Tobacco use: Secondary | ICD-10-CM

## 2012-04-16 DIAGNOSIS — I10 Essential (primary) hypertension: Secondary | ICD-10-CM

## 2012-04-16 DIAGNOSIS — E119 Type 2 diabetes mellitus without complications: Secondary | ICD-10-CM

## 2012-04-16 DIAGNOSIS — F17200 Nicotine dependence, unspecified, uncomplicated: Secondary | ICD-10-CM

## 2012-04-16 LAB — POCT GLYCOSYLATED HEMOGLOBIN (HGB A1C): Hemoglobin A1C: 7.7

## 2012-04-16 MED ORDER — ATORVASTATIN CALCIUM 40 MG PO TABS: 40.0000 mg | ORAL_TABLET | Freq: Every day | ORAL | Status: DC

## 2012-04-16 NOTE — Patient Instructions (Addendum)
You can increase your Lantus by one unit a day until your morning sugars are under 130.

## 2012-04-16 NOTE — Progress Notes (Signed)
  Subjective:    Patient ID: Justin Goodman, male    DOB: 1955-11-29, 57 y.o.   MRN: [id number]  HPI He quit smoking for 2 weeks.  He is doing well except he has gained a couple pounds and is distressed about this. The he admits that he was in the work on vacation and the weather was so cold that he really didn't get out. In fact he sat in his brother's home and they've a lot of food over the last 2 weeks.  DM - No hypoglycemic events.  Sugars are running 155.  No cuts or sores on the foot.  He is currently using 36 units of lantus.   HTN-  Pt denies chest pain, SOB, dizziness, or heart palpitations.  Taking meds as directed w/o problems.  Denies medication side effects.     Review of Systems     Objective:   Physical Exam  Constitutional: He is oriented to person, place, and time. He appears well-developed and well-nourished.  HENT:  Head: Normocephalic and atraumatic.  Cardiovascular: Normal rate, regular rhythm and normal heart sounds.   Pulmonary/Chest: Effort normal and breath sounds normal.  Neurological: He is alert and oriented to person, place, and time.  Skin: Skin is warm and dry.  Psychiatric: He has a normal mood and affect. His behavior is normal.          Assessment & Plan:  DM- uncontrolled today. His A1c is up to 7.7. It was 6.93 months ago. I discussed the importance of getting back onto an exercise routine and was in a couple pounds he has gained over the holidays. He can increase his Lantus 1 unit today until his fasting glucose is under 130 in the morning. Didn't stay on that regimen until I see him back in 3 months. Exam was performed today. Urine microalbumin was performed today.   Hypertension-well-controlled. Continue current regimen.  Tobacco abuse-congratulated  him for quitting smoking. He is 2 weeks and which is fantastic. I encouraged him to work every day and if he works on eating lots of vegetables and healthy snacks and get back into walking I think  it will call in her balance the recent weight gain that he is contributing to his smoking cessation.

## 2012-04-26 ENCOUNTER — Encounter: Payer: Self-pay | Admitting: Sports Medicine

## 2012-04-26 ENCOUNTER — Ambulatory Visit (INDEPENDENT_AMBULATORY_CARE_PROVIDER_SITE_OTHER): Payer: Medicare Other | Admitting: Sports Medicine

## 2012-04-26 VITALS — BP 118/73 | HR 86 | Wt 181.0 lb

## 2012-04-26 DIAGNOSIS — E1169 Type 2 diabetes mellitus with other specified complication: Secondary | ICD-10-CM

## 2012-04-26 DIAGNOSIS — N521 Erectile dysfunction due to diseases classified elsewhere: Secondary | ICD-10-CM

## 2012-04-26 DIAGNOSIS — M545 Low back pain, unspecified: Secondary | ICD-10-CM

## 2012-04-26 DIAGNOSIS — N529 Male erectile dysfunction, unspecified: Secondary | ICD-10-CM

## 2012-04-26 NOTE — Assessment & Plan Note (Signed)
Failure of oral medications. He is desiring intracavernosal prostaglandins injections. Appointment with urology is in March.

## 2012-04-26 NOTE — Progress Notes (Signed)
SPORTS MEDICINE CONSULTATION REPORT  Subjective:    CC: Followup  HPI: Back and leg pain: He does carry a diagnosis of a myofascial pain syndrome, we have tried tramadol, duloxetine, Savella, gabapentin, amitriptyline. Unfortunately these medicines and only had short-term efficacy. He still localizes pain predominantly in both calfs, that he describes as a crampy sensation. Currently his back pain is improved. He is now off all pain medicines. Pain radiates in L5 versus S1 distribution bilaterally, is not made worse or better by anything, and is moderate.  Erectile dysfunction: He does have a followup visit coming up in March of 2014 with the urologist for consideration of intracavernosal prostaglandins.  Past medical history, Surgical history, Family history not pertinant except as noted below, Social history, Allergies, and medications have been entered into the medical record, reviewed, and no changes needed.   Review of Systems: No headache, visual changes, nausea, vomiting, diarrhea, constipation, dizziness, abdominal pain, skin rash, fevers, chills, night sweats, weight loss, swollen lymph nodes, body aches, joint swelling, muscle aches, chest pain, shortness of breath, mood changes, visual or auditory hallucinations.   Objective:   Vitals:  Afebrile, vital signs stable. General: Well Developed, well nourished, and in no acute distress.  Neuro/Psych: Alert and oriented x3, extra-ocular muscles intact, able to move all 4 extremities, sensation grossly intact. Skin: Warm and dry, no rashes noted.  Respiratory: Not using accessory muscles, speaking in full sentences, trachea midline.  Cardiovascular: Pulses palpable, no extremity edema. Abdomen: Does not appear distended.  Impression and Recommendations:   This case required medical decision making of moderate complexity.

## 2012-04-26 NOTE — Assessment & Plan Note (Signed)
So far we have had failures of Cymbalta, amitriptyline, gabapentin, Savella, tramadol. His symptoms may represent radicular pain, we have discussed this on multiple occasions in the past and he's been resistant to MRI, and I think I have convinced him today to proceed with MRI of the lumbar spine. I will see him back to go over results.

## 2012-04-27 ENCOUNTER — Telehealth: Payer: Self-pay | Admitting: *Deleted

## 2012-04-27 NOTE — Telephone Encounter (Signed)
Prior auth obtained thru [facility] MRI Lumbar spine w/o contrast. Approval # [id number] good til 06/11/2012. Eber Jones notified

## 2012-05-04 ENCOUNTER — Ambulatory Visit (INDEPENDENT_AMBULATORY_CARE_PROVIDER_SITE_OTHER): Payer: Medicare Other

## 2012-05-04 DIAGNOSIS — M545 Low back pain, unspecified: Secondary | ICD-10-CM

## 2012-05-06 ENCOUNTER — Ambulatory Visit (INDEPENDENT_AMBULATORY_CARE_PROVIDER_SITE_OTHER): Payer: Medicare Other | Admitting: Sports Medicine

## 2012-05-06 DIAGNOSIS — M5416 Radiculopathy, lumbar region: Secondary | ICD-10-CM

## 2012-05-06 DIAGNOSIS — IMO0002 Reserved for concepts with insufficient information to code with codable children: Secondary | ICD-10-CM

## 2012-05-06 NOTE — Progress Notes (Signed)
  Subjective:    CC: Followup  HPI: Justin Goodman comes back to see me for followup of low back pain with bilateral leg symptoms. He has been through multiple oral medications, nearly every GABAergic medicine, fibromyalgia medicine, SNRI, amitriptyline, trigger point injections.  He's also been through home physical therapy. Unfortunately, nothing has worked. I recently obtained an MRI for interventional injection planning.   Past medical history, Surgical history, Family history not pertinant except as noted below, Social history, Allergies, and medications have been entered into the medical record, reviewed, and no changes needed.   Review of Systems: No headache, visual changes, nausea, vomiting, diarrhea, constipation, dizziness, abdominal pain, skin rash, fevers, chills, night sweats, weight loss, swollen lymph nodes, body aches, joint swelling, muscle aches, chest pain, shortness of breath, mood changes, visual or auditory hallucinations.   Objective:   General: Well Developed, well nourished, and in no acute distress.  Neuro/Psych: Alert and oriented x3, extra-ocular muscles intact, able to move all 4 extremities, sensation grossly intact. Skin: Warm and dry, no rashes noted.  Respiratory: Not using accessory muscles, speaking in full sentences, trachea midline.  Cardiovascular: Pulses palpable, no extremity edema. Abdomen: Does not appear distended.  I reviewed the MRI myself, there is multilevel foraminal and spinal stenosis at the L3-L4, L4-L5, and L5-S1 levels, significantly worse on the right at the L4-L5 level. There's also multilevel facet spondylosis.  Impression and Recommendations:   This case required medical decision making of moderate complexity.

## 2012-05-06 NOTE — Assessment & Plan Note (Signed)
MRI shows multilevel spinal stenosis worse at the L4-L5 level. Worse to the right as well. We have failed conservative treatment, I'm going to pull the trigger for right-sided interlaminar L4-L5 epidural injection. I would like to see him back afterwards.

## 2012-05-11 ENCOUNTER — Other Ambulatory Visit: Payer: Self-pay | Admitting: *Deleted

## 2012-05-11 MED ORDER — INSULIN PEN NEEDLE 31G X 8 MM MISC: 1.0000 [IU] | Freq: Every day | Status: DC

## 2012-05-11 MED ORDER — GLIMEPIRIDE 4 MG PO TABS: 4.0000 mg | ORAL_TABLET | Freq: Every day | ORAL | Status: DC

## 2012-05-11 MED ORDER — METFORMIN HCL 1000 MG PO TABS: 1000.0000 mg | ORAL_TABLET | Freq: Two times a day (BID) | ORAL | Status: DC

## 2012-05-11 MED ORDER — INSULIN GLARGINE 100 UNIT/ML ~~LOC~~ SOLN: 42.0000 [IU] | Freq: Every day | SUBCUTANEOUS | Status: DC

## 2012-05-11 MED ORDER — METOPROLOL TARTRATE 50 MG PO TABS: 25.0000 mg | ORAL_TABLET | Freq: Two times a day (BID) | ORAL | Status: DC

## 2012-05-11 MED ORDER — QUINAPRIL HCL 20 MG PO TABS: 10.0000 mg | ORAL_TABLET | Freq: Two times a day (BID) | ORAL | Status: DC

## 2012-05-11 MED ORDER — ATORVASTATIN CALCIUM 40 MG PO TABS: 40.0000 mg | ORAL_TABLET | Freq: Every day | ORAL | Status: DC

## 2012-05-14 ENCOUNTER — Ambulatory Visit
Admission: RE | Admit: 2012-05-14 | Discharge: 2012-05-14 | Disposition: A | Discharge status: Home / Self Care | Payer: Medicare Other | Source: Ambulatory Visit | Attending: Sports Medicine | Admitting: Sports Medicine

## 2012-05-14 VITALS — BP 152/78 | HR 82

## 2012-05-14 DIAGNOSIS — M5416 Radiculopathy, lumbar region: Secondary | ICD-10-CM

## 2012-05-14 MED ORDER — IOHEXOL 180 MG/ML  SOLN
1.0000 mL | Freq: Once | INTRAMUSCULAR | Status: AC | PRN
  Administered 2012-05-14: 1 mL via EPIDURAL

## 2012-05-14 MED ORDER — METHYLPREDNISOLONE ACETATE 40 MG/ML INJ SUSP (RADIOLOG
120.0000 mg | Freq: Once | INTRAMUSCULAR | Status: AC
  Administered 2012-05-14: 120 mg via EPIDURAL

## 2012-05-18 ENCOUNTER — Other Ambulatory Visit: Payer: Self-pay | Admitting: *Deleted

## 2012-05-18 DIAGNOSIS — G8929 Other chronic pain: Secondary | ICD-10-CM

## 2012-05-18 MED ORDER — MELOXICAM 15 MG PO TABS: 15.0000 mg | ORAL_TABLET | Freq: Every day | ORAL | Status: DC

## 2012-05-25 ENCOUNTER — Encounter: Payer: Self-pay | Admitting: Sports Medicine

## 2012-05-25 ENCOUNTER — Ambulatory Visit (INDEPENDENT_AMBULATORY_CARE_PROVIDER_SITE_OTHER): Payer: Medicare Other | Admitting: Sports Medicine

## 2012-05-25 VITALS — BP 119/66 | HR 91 | Temp 98.7°F | Wt 179.0 lb

## 2012-05-25 DIAGNOSIS — J209 Acute bronchitis, unspecified: Secondary | ICD-10-CM | POA: Insufficient documentation

## 2012-05-25 DIAGNOSIS — M5416 Radiculopathy, lumbar region: Secondary | ICD-10-CM

## 2012-05-25 DIAGNOSIS — IMO0002 Reserved for concepts with insufficient information to code with codable children: Secondary | ICD-10-CM

## 2012-05-25 MED ORDER — OMEPRAZOLE 40 MG PO CPDR: 40.0000 mg | DELAYED_RELEASE_CAPSULE | Freq: Every day | ORAL | Status: DC

## 2012-05-25 MED ORDER — MUCINEX DM 30-600 MG PO TB12: 1.0000 | ORAL_TABLET | Freq: Two times a day (BID) | ORAL | Status: DC

## 2012-05-25 MED ORDER — AZITHROMYCIN 250 MG PO TABS: ORAL_TABLET | ORAL | Status: DC

## 2012-05-25 MED ORDER — BENZONATATE 200 MG PO CAPS: 200.0000 mg | ORAL_CAPSULE | Freq: Three times a day (TID) | ORAL | Status: DC | PRN

## 2012-05-25 NOTE — Assessment & Plan Note (Signed)
Has recently quit smoking. No hemoptysis. We will treat conservatively with azithromycin, Tessalon Perles, and he can get over-the-counter Mucinex. If symptoms persist at that point we should get a chest x-ray, he declines chest x-ray today.

## 2012-05-25 NOTE — Progress Notes (Signed)
  Subjective:    CC: Followup  HPI: Lumbar degenerative disc disease: Joab is now status post an interlaminar epidural steroid injection at the left L4-5 level. He does note that he's had an excellent response and his pain has improved significantly.  Cough: Present for 2 months, productive of phlegm but no blood, no constitutional symptoms, desires some antibiotics and cough suppressant. He did recently stop smoking.  Past medical history, Surgical history, Family history not pertinant except as noted below, Social history, Allergies, and medications have been entered into the medical record, reviewed, and no changes needed.   Review of Systems: No headache, visual changes, nausea, vomiting, diarrhea, constipation, dizziness, abdominal pain, skin rash, fevers, chills, night sweats, weight loss, swollen lymph nodes, body aches, joint swelling, muscle aches, chest pain, shortness of breath, mood changes, visual or auditory hallucinations.   Objective:   General: Well Developed, well nourished, and in no acute distress.  Neuro/Psych: Alert and oriented x3, extra-ocular muscles intact, able to move all 4 extremities, sensation grossly intact. Skin: Warm and dry, no rashes noted.  Respiratory: Not using accessory muscles, speaking in full sentences, trachea midline.  Cardiovascular: Pulses palpable, no extremity edema. Abdomen: Does not appear distended. Impression and Recommendations:   This case required medical decision making of moderate complexity.

## 2012-05-25 NOTE — Assessment & Plan Note (Addendum)
Symptoms greatly improved after initial L4-L5 interlaminar epidural steroid injection. He will let us know when he is ready to try the second of the series.

## 2012-06-04 ENCOUNTER — Encounter: Payer: Self-pay | Admitting: Family Medicine

## 2012-06-04 ENCOUNTER — Ambulatory Visit (INDEPENDENT_AMBULATORY_CARE_PROVIDER_SITE_OTHER): Payer: Medicare Other | Admitting: Family Medicine

## 2012-06-04 VITALS — BP 126/65 | HR 101 | Ht 69.0 in | Wt 179.0 lb

## 2012-06-04 DIAGNOSIS — T44905A Adverse effect of unspecified drugs primarily affecting the autonomic nervous system, initial encounter: Secondary | ICD-10-CM

## 2012-06-04 DIAGNOSIS — J019 Acute sinusitis, unspecified: Secondary | ICD-10-CM

## 2012-06-04 DIAGNOSIS — T464X5A Adverse effect of angiotensin-converting-enzyme inhibitors, initial encounter: Secondary | ICD-10-CM

## 2012-06-04 DIAGNOSIS — R05 Cough: Secondary | ICD-10-CM

## 2012-06-04 DIAGNOSIS — K5900 Constipation, unspecified: Secondary | ICD-10-CM

## 2012-06-04 DIAGNOSIS — R059 Cough, unspecified: Secondary | ICD-10-CM

## 2012-06-04 MED ORDER — LOSARTAN POTASSIUM 50 MG PO TABS: 50.0000 mg | ORAL_TABLET | Freq: Every day | ORAL | Status: DC

## 2012-06-04 MED ORDER — AMOXICILLIN-POT CLAVULANATE 875-125 MG PO TABS: 1.0000 | ORAL_TABLET | Freq: Two times a day (BID) | ORAL | Status: DC

## 2012-06-04 MED ORDER — QUINAPRIL HCL 20 MG PO TABS: 10.0000 mg | ORAL_TABLET | Freq: Two times a day (BID) | ORAL | Status: DC

## 2012-06-04 NOTE — Progress Notes (Addendum)
  Subjective:    Patient ID: Justin Goodman, male    DOB: May 16, 1955, 57 y.o.   MRN: [id number]  HPI Cough and ST x 3 weeks.  Some SOB with exercise.  Tickle in his throat. Put on zpack and cough meds with no improvment. Nose and eye start running especially once he starts coughing. He's also had some intermittent nasal congestion facial pressure. No fever. No other over-the-counter cough or cold medications. No GI symptoms.   Constipation-chronic-he has been trying take fish oil for the last 2-3 months to see if it would improve his constipation. He says it really hasn't helped in fact he feels that she's been more constipated recently. He has not tried anything else over-the-counter. He denies any blood in the stool but he says the bowel movements are painful.  Review of Systems     Objective:   Physical Exam  Constitutional: He is oriented to person, place, and time. He appears well-developed and well-nourished.  HENT:  Head: Normocephalic and atraumatic.  Right Ear: External ear normal.  Left Ear: External ear normal.  Nose: Nose normal.  Mouth/Throat: Oropharynx is clear and moist.  TMs blocked by cerumen.   Eyes: Conjunctivae and EOM are normal. Pupils are equal, round, and reactive to light.  Neck: Neck supple. No thyromegaly present.  Cardiovascular: Normal rate and normal heart sounds.   Pulmonary/Chest: Effort normal and breath sounds normal.  Lymphadenopathy:    He has no cervical adenopathy.  Neurological: He is alert and oriented to person, place, and time.  Skin: Skin is warm and dry.  Psychiatric: He has a normal mood and affect.          Assessment & Plan:  Sinusitis - symptoms have persisted for several weeks this point. I will have him on Augmentin since did not improve with CPAP. Call if not significantly better in the next 2-3 weeks. Call also if feels she's getting worse or develops a fever. I would also like him to add Claritin to his regimen as there may be  an allergic rhinitis component to this. Consider adding a nasal steroid if not continuing to improve.  Cough-the way he is describing his cough I suspect he may actually have an ACE inhibitor induced cough. Will stop Monopril and change to losartan. I like to see him back in 6 weeks just to make sure blood pressures well controlled after the change.  Constipation-not improving. Recommend starting over-the-counter MiraLax in addition to drinking plenty of fluids and eating fiber. Can decrease dosing to every other day if stools become too loose.

## 2012-06-04 NOTE — Patient Instructions (Signed)
For your sinus symptoms I would like you to try over-the-counter Claritin. Make sure it does not have the decongestant. Make sure plain Claritin. You can buy the store version if you would like. Take one a day for the next 2 weeks to see if this helps with some of the runny nose and watery eyes symptoms. Make sure to complete her antibiotic, please call if you're having any side effects.  We are going to stop your quinapril. I think this could be causing her cough. Will change to losartan. It may take 2-3 weeks for her cough to completely go away. If you're still coughing frequently at that point in time then please come back and see me.  For your constipation-please start over-the-counter MiraLax. The store version works just as well. Used once a day mixed with an 8 ounce glass of water.  You can use this once a day safely long-term. If the stools are getting too loose then you can decrease to every other day if needed.

## 2012-06-15 ENCOUNTER — Ambulatory Visit: Payer: Medicare Other | Admitting: Family Medicine

## 2012-06-17 ENCOUNTER — Ambulatory Visit (INDEPENDENT_AMBULATORY_CARE_PROVIDER_SITE_OTHER): Payer: Medicare Other | Admitting: Family Medicine

## 2012-06-17 ENCOUNTER — Encounter: Payer: Self-pay | Admitting: Family Medicine

## 2012-06-17 ENCOUNTER — Telehealth: Payer: Self-pay | Admitting: Family Medicine

## 2012-06-17 ENCOUNTER — Ambulatory Visit: Payer: Medicare Other

## 2012-06-17 ENCOUNTER — Other Ambulatory Visit: Payer: Self-pay | Admitting: *Deleted

## 2012-06-17 VITALS — BP 135/60 | HR 107 | Wt 179.0 lb

## 2012-06-17 DIAGNOSIS — I10 Essential (primary) hypertension: Secondary | ICD-10-CM

## 2012-06-17 DIAGNOSIS — R0602 Shortness of breath: Secondary | ICD-10-CM

## 2012-06-17 DIAGNOSIS — R059 Cough, unspecified: Secondary | ICD-10-CM

## 2012-06-17 DIAGNOSIS — R05 Cough: Secondary | ICD-10-CM

## 2012-06-17 MED ORDER — GABAPENTIN 600 MG PO TABS: 1200.0000 mg | ORAL_TABLET | Freq: Three times a day (TID) | ORAL | Status: DC

## 2012-06-17 MED ORDER — LEVOFLOXACIN 500 MG PO TABS: 500.0000 mg | ORAL_TABLET | Freq: Every day | ORAL | Status: AC

## 2012-06-17 NOTE — Telephone Encounter (Signed)
Patient states [facility] does not accept his insurance and if he will be okay with the x-ray he is not going to get it done, but if he has to have it done [facility]. Can a nurse call him and let him know if he has to get x-ray done. Thanks.

## 2012-06-17 NOTE — Telephone Encounter (Signed)
WE can set it up wherever they take his insurance.

## 2012-06-17 NOTE — Patient Instructions (Addendum)
We will call you with the Chest xray results.

## 2012-06-17 NOTE — Progress Notes (Signed)
  Subjective:    Patient ID: Justin Goodman, male    DOB: 1956-01-13, 57 y.o.   MRN: [id number]  HPI Says he is really not any better. Says mucinex didn't help. Completed the augmentin. Says he still feels like a lot of congestion in his chest but he still can't move it. No facial pain or pressure. No headaches. No fever. He feels that the Augmentin really did not help. He is still feeling short of breath with activities. No actual chest pain. Throat is a little bit sore from coughing. No ear pain or pressure. Still having a lot of sneezing and runny nose.   Review of Systems     Objective:   Physical Exam  Constitutional: He is oriented to person, place, and time. He appears well-developed and well-nourished.  HENT:  Head: Normocephalic and atraumatic.  Right Ear: External ear normal.  Left Ear: External ear normal.  Nose: Nose normal.  Mouth/Throat: Oropharynx is clear and moist.  TMs and canals are clear.   Eyes: Conjunctivae and EOM are normal. Pupils are equal, round, and reactive to light.  Neck: Neck supple. No thyromegaly present.  Cardiovascular: Normal rate and normal heart sounds.   Pulmonary/Chest: Effort normal and breath sounds normal.  Lymphadenopathy:    He has no cervical adenopathy.  Neurological: He is alert and oriented to person, place, and time.  Skin: Skin is warm and dry.  Psychiatric: He has a normal mood and affect.          Assessment & Plan:  Cough- at this point he's been coughing for almost 2 months. Recommend chest x-ray for further evaluation. We actually changed his ACE inhibitor and are making sure that this wasn't contributing to the cough. He was also placed on Augmentin which she completed. I will go ahead and put him on Levaquin and get a chest x-ray today. We will call with results once they're available. Also consider that it could be an allergic component and possibly a nasal steroid spray or an oral antihistamine could be  helpful.  Hypertension-well-controlled on ARB.

## 2012-06-18 ENCOUNTER — Ambulatory Visit: Payer: Medicare Other | Admitting: Family Medicine

## 2012-06-18 NOTE — Telephone Encounter (Signed)
Left message on machine for patient to return my call. Which imaging location?

## 2012-06-22 ENCOUNTER — Other Ambulatory Visit: Payer: Self-pay | Admitting: *Deleted

## 2012-06-22 DIAGNOSIS — M545 Low back pain, unspecified: Secondary | ICD-10-CM

## 2012-06-22 MED ORDER — AMITRIPTYLINE HCL 50 MG PO TABS: 50.0000 mg | ORAL_TABLET | Freq: Every day | ORAL | Status: DC

## 2012-07-12 ENCOUNTER — Other Ambulatory Visit: Payer: Self-pay | Admitting: Family Medicine

## 2012-07-13 ENCOUNTER — Other Ambulatory Visit: Payer: Self-pay | Admitting: *Deleted

## 2012-07-13 DIAGNOSIS — R05 Cough: Secondary | ICD-10-CM

## 2012-07-13 DIAGNOSIS — R059 Cough, unspecified: Secondary | ICD-10-CM

## 2012-07-13 NOTE — Addendum Note (Signed)
Addended by: Deno Etienne on: 07/13/2012 04:03 PM   Modules accepted: Orders

## 2012-07-15 ENCOUNTER — Ambulatory Visit: Payer: Medicare Other | Admitting: Family Medicine

## 2012-07-21 ENCOUNTER — Ambulatory Visit (INDEPENDENT_AMBULATORY_CARE_PROVIDER_SITE_OTHER): Payer: Medicare Other | Admitting: Family Medicine

## 2012-07-21 ENCOUNTER — Encounter: Payer: Self-pay | Admitting: Family Medicine

## 2012-07-21 VITALS — BP 129/68 | HR 80 | Wt 182.0 lb

## 2012-07-21 DIAGNOSIS — E119 Type 2 diabetes mellitus without complications: Secondary | ICD-10-CM

## 2012-07-21 DIAGNOSIS — J309 Allergic rhinitis, unspecified: Secondary | ICD-10-CM | POA: Insufficient documentation

## 2012-07-21 DIAGNOSIS — R0602 Shortness of breath: Secondary | ICD-10-CM

## 2012-07-21 DIAGNOSIS — I10 Essential (primary) hypertension: Secondary | ICD-10-CM

## 2012-07-21 DIAGNOSIS — Z87891 Personal history of nicotine dependence: Secondary | ICD-10-CM | POA: Insufficient documentation

## 2012-07-21 LAB — POCT GLYCOSYLATED HEMOGLOBIN (HGB A1C): Hemoglobin A1C: 7.1

## 2012-07-21 NOTE — Progress Notes (Signed)
  Subjective:    Patient ID: Justin Goodman, male    DOB: January 15, 1956, 57 y.o.   MRN: [id number]  HPI  DM - Sugars running around 130-140.  Has been doing much better.  No hypoglycmic events. No CP but occ feels SOB.  Increased breathign with acitivity, the says sometimes he can also feel short of breath when talking at rest. He's also had a persistent cough that has recently improved by taking an over-the-counter antihistamine.. Quit smoking 3 mo ago. Using 40 lunits ot Lantus at bedtime.   Allergic rhinitis-Says the tickel in is throat is better on the OTC histamine.  Also on ARB instead of ACE.  HTn- Pt denies chest pain, SOB, dizziness, or heart palpitations.  Taking meds as directed w/o problems.  Denies medication side effects.   Review of Systems     Objective:   Physical Exam  Constitutional: He is oriented to person, place, and time. He appears well-developed and well-nourished.  HENT:  Head: Normocephalic and atraumatic.  Cardiovascular: Normal rate, regular rhythm and normal heart sounds.   Pulmonary/Chest: Effort normal and breath sounds normal.  Neurological: He is alert and oriented to person, place, and time.  Skin: Skin is warm and dry.  Psychiatric: He has a normal mood and affect. His behavior is normal.          Assessment & Plan:  DM- much improved, but still uncontrolled. On ARB and statin.  To him the goal is to get under 7.0. Overall he is making great strides and has made some major dietary changes. He is taking his medications regularly. Followup in 3 months. Lab Results  Component Value Date   HGBA1C 7.1 07/21/2012    Shortness of breath-I suspect he has some early COPD. Lung exam is clear today. We did a spirometry on him about a year ago. He quit smoking about 3 months ago. I encouraged him to followup next month for spirometry for further evaluation. I suspect he would benefit from an inhaler since he feels he is symptomatic most days. He did get Mrs.  Cohen to have his chest x-ray done about a week ago but unfortunately we do not have the report. We are going to call and see if they can fax over.   HTN- well controlled.   Addendum-chest x-ray report showed a normal chest x-ray.

## 2012-07-28 ENCOUNTER — Other Ambulatory Visit: Payer: Self-pay | Admitting: Family Medicine

## 2012-07-29 ENCOUNTER — Other Ambulatory Visit: Payer: Self-pay | Admitting: *Deleted

## 2012-07-29 MED ORDER — LOSARTAN POTASSIUM 50 MG PO TABS: 50.0000 mg | ORAL_TABLET | Freq: Every day | ORAL | Status: DC

## 2012-07-30 ENCOUNTER — Telehealth: Payer: Self-pay | Admitting: *Deleted

## 2012-07-30 NOTE — Telephone Encounter (Signed)
Called pt and told him that we received a letter that said he [facility] on 3.28.14 pt stated that he called them and cancelled this appt. I asked pt to call back and rescheduled at his convenience.Laureen Ochs, Viann Shove

## 2012-08-19 ENCOUNTER — Ambulatory Visit (INDEPENDENT_AMBULATORY_CARE_PROVIDER_SITE_OTHER): Payer: Medicare Other | Admitting: Family Medicine

## 2012-08-19 ENCOUNTER — Encounter: Payer: Self-pay | Admitting: Family Medicine

## 2012-08-19 VITALS — BP 122/67 | HR 88 | Wt 181.0 lb

## 2012-08-19 DIAGNOSIS — Z23 Encounter for immunization: Secondary | ICD-10-CM

## 2012-08-19 DIAGNOSIS — E785 Hyperlipidemia, unspecified: Secondary | ICD-10-CM

## 2012-08-19 DIAGNOSIS — R05 Cough: Secondary | ICD-10-CM

## 2012-08-19 DIAGNOSIS — J984 Other disorders of lung: Secondary | ICD-10-CM

## 2012-08-19 DIAGNOSIS — E119 Type 2 diabetes mellitus without complications: Secondary | ICD-10-CM

## 2012-08-19 DIAGNOSIS — Z87891 Personal history of nicotine dependence: Secondary | ICD-10-CM

## 2012-08-19 DIAGNOSIS — R059 Cough, unspecified: Secondary | ICD-10-CM

## 2012-08-19 DIAGNOSIS — I10 Essential (primary) hypertension: Secondary | ICD-10-CM

## 2012-08-19 MED ORDER — ALBUTEROL SULFATE (2.5 MG/3ML) 0.083% IN NEBU
2.5000 mg | INHALATION_SOLUTION | Freq: Once | RESPIRATORY_TRACT | Status: AC
  Administered 2012-08-19: 2.5 mg via RESPIRATORY_TRACT

## 2012-08-19 MED ORDER — OLMESARTAN MEDOXOMIL 40 MG PO TABS: 40.0000 mg | ORAL_TABLET | Freq: Every day | ORAL | Status: DC

## 2012-08-19 NOTE — Progress Notes (Signed)
  Subjective:    Patient ID: Justin Goodman, male    DOB: 09/07/1955, 57 y.o.   MRN: [id number]  HPI  HTN- pt states that the losartan is causing him to have a headache every day, fatigue, and issues with his eyes. We had switched him off of quinapril because I thought it could be causing his chronic cough.  No CP or SOB.    Cough - is the same off the lisinopril.  Mostly dry occ productive cough. Gets phlegm  In his throat.  Former smoke. No SOB currently.    Review of Systems     Objective:   Physical Exam  Constitutional: He is oriented to person, place, and time. He appears well-developed and well-nourished.  HENT:  Head: Normocephalic and atraumatic.  Cardiovascular: Normal rate, regular rhythm and normal heart sounds.   Pulmonary/Chest: Effort normal and breath sounds normal.  Neurological: He is alert and oriented to person, place, and time.  Skin: Skin is warm and dry.  Psychiatric: He has a normal mood and affect. His behavior is normal.          Assessment & Plan:  HTN- well controlled but having S.E. On the losartan. Will change to Benicar.  I would really t like to try to stick with an arm or ACE inhibitor because of his diagnosis of diabetes. If he still cannot tolerate this then we may need to switch but I would like to try at least one more ARB.  Cough - Likey from COPD.  Will do PFTs today. Spirometry shows FEV1 of 60%, FEC is 66% with a ratio of 73%. This is consistent with moderate restriction. Interestingly he had significant improvement in FEV1, 22% improvement. 11% improvement in FVC and 94% improvement with FEF 25-75. Since he had significant improvement with albuterol I would like to try him on a combined inhaler for the next month to see if this improves his symptoms and his cough. Will give him samples of Symbicort 160. One puff twice a day and followup in one month to see how he is improving. At this point I'm convinced that the cough is not from his ACE  inhibitor. In fact I think we can probably safely return him back to the quinapril if he would like.  Pneumonia vaccine updated today.

## 2012-08-20 LAB — COMPLETE METABOLIC PANEL WITH GFR
ALT: 13 U/L (ref 0–53)
Albumin: 4.8 g/dL (ref 3.5–5.2)
CO2: 28 mEq/L (ref 19–32)
Calcium: 9.9 mg/dL (ref 8.4–10.5)
Chloride: 96 mEq/L (ref 96–112)
GFR, Est African American: >89 mL/min
Sodium: 132 mEq/L — ABNORMAL LOW (ref 135–145)
Total Protein: 7.3 g/dL (ref 6.0–8.3)

## 2012-08-20 LAB — LIPID PANEL: LDL Cholesterol: 78 mg/dL (ref 0–99)

## 2012-08-22 ENCOUNTER — Other Ambulatory Visit: Payer: Self-pay | Admitting: Family Medicine

## 2012-08-25 ENCOUNTER — Telehealth: Payer: Self-pay | Admitting: *Deleted

## 2012-08-25 DIAGNOSIS — E870 Hyperosmolality and hypernatremia: Secondary | ICD-10-CM

## 2012-08-25 DIAGNOSIS — I10 Essential (primary) hypertension: Secondary | ICD-10-CM

## 2012-08-25 NOTE — Telephone Encounter (Signed)
Repeat lab.Loralee Pacas [place]

## 2012-08-26 ENCOUNTER — Other Ambulatory Visit: Payer: Self-pay | Admitting: *Deleted

## 2012-08-26 LAB — COMPREHENSIVE METABOLIC PANEL
ALT: 13 U/L (ref 0–53)
CO2: 26 mEq/L (ref 19–32)
Calcium: 9.4 mg/dL (ref 8.4–10.5)
Chloride: 101 mEq/L (ref 96–112)
Sodium: 136 mEq/L (ref 135–145)
Total Bilirubin: 0.4 mg/dL (ref 0.3–1.2)
Total Protein: 7.1 g/dL (ref 6.0–8.3)

## 2012-08-26 MED ORDER — OMEPRAZOLE 40 MG PO CPDR: 40.0000 mg | DELAYED_RELEASE_CAPSULE | Freq: Every day | ORAL | Status: DC

## 2012-08-27 ENCOUNTER — Encounter: Payer: Self-pay | Admitting: Family Medicine

## 2012-09-03 ENCOUNTER — Other Ambulatory Visit: Payer: Self-pay | Admitting: *Deleted

## 2012-09-03 DIAGNOSIS — E119 Type 2 diabetes mellitus without complications: Secondary | ICD-10-CM

## 2012-09-08 DIAGNOSIS — R399 Unspecified symptoms and signs involving the genitourinary system: Secondary | ICD-10-CM | POA: Insufficient documentation

## 2012-09-16 ENCOUNTER — Encounter: Payer: Self-pay | Admitting: Family Medicine

## 2012-09-16 ENCOUNTER — Ambulatory Visit (INDEPENDENT_AMBULATORY_CARE_PROVIDER_SITE_OTHER): Payer: Medicare Other | Admitting: Family Medicine

## 2012-09-16 VITALS — BP 105/63 | HR 86 | Wt 182.0 lb

## 2012-09-16 DIAGNOSIS — I10 Essential (primary) hypertension: Secondary | ICD-10-CM

## 2012-09-16 DIAGNOSIS — J449 Chronic obstructive pulmonary disease, unspecified: Secondary | ICD-10-CM

## 2012-09-16 MED ORDER — INSULIN GLARGINE 100 UNIT/ML ~~LOC~~ SOLN: 42.0000 [IU] | Freq: Every day | SUBCUTANEOUS | Status: DC

## 2012-09-16 MED ORDER — BUDESONIDE-FORMOTEROL FUMARATE 160-4.5 MCG/ACT IN AERO: 2.0000 | INHALATION_SPRAY | Freq: Two times a day (BID) | RESPIRATORY_TRACT | Status: DC

## 2012-09-16 MED ORDER — LOSARTAN POTASSIUM 100 MG PO TABS: 100.0000 mg | ORAL_TABLET | Freq: Every day | ORAL | Status: DC

## 2012-09-16 NOTE — Progress Notes (Signed)
  Subjective:    Patient ID: Justin Goodman, male    DOB: [birth year], 57 y.o.   MRN: [id number]  HPI Here for followup COPD-weighted spirometry his last office visit I gave him samples of Symbicort to use until I saw him back. He says it has helped his breathing and has noticed has been easier to exercise and walk. He's been trying to exercise at least 3 days a week. He denies any chest pain. He says his cough is actually a little better as well. He would like some more samples if possible.  Hypertension-he says the Benicar was too expensive and would like to switch back to losartan. He did tolerate it well there.   Review of Systems     Objective:   Physical Exam  Constitutional: He is oriented to person, place, and time. He appears well-developed and well-nourished.  HENT:  Head: Normocephalic and atraumatic.  Cardiovascular: Normal rate, regular rhythm and normal heart sounds.   Pulmonary/Chest: Effort normal and breath sounds normal.  Neurological: He is alert and oriented to person, place, and time.  Skin: Skin is warm and dry.  Psychiatric: He has a normal mood and affect. His behavior is normal.          Assessment & Plan:  COPD-well-controlled at this point. Tolerating the Symbicort well without any side effects. Another sample given today and we will consider prescription to his pharmacy. He will followup in one month for his diabetes.  Hypertension-well-controlled on current regimen. We will discontinue the Benicar because the cost and switch him back to losartan 100 mg. I think he is now convinced his cough really was coming from his lungs and not from the blood pressure medication but certainly if it worsens once he switches back to the losartan then please let me know.

## 2012-09-23 ENCOUNTER — Telehealth: Payer: Self-pay | Admitting: *Deleted

## 2012-09-23 MED ORDER — FLUTICASONE FUROATE-VILANTEROL 100-25 MCG/INH IN AEPB: 1.0000 | INHALATION_SPRAY | Freq: Every day | RESPIRATORY_TRACT | Status: DC

## 2012-09-23 NOTE — Telephone Encounter (Signed)
I can switch to [facility].

## 2012-09-23 NOTE — Telephone Encounter (Signed)
Left message on machine that new med sent to pharmacy to try if any questions to call office back. Barry Dienes, LPN

## 2012-09-23 NOTE — Telephone Encounter (Signed)
Patient calls and states that the Symbicort is too expensive cost 643.00 and can not afford this. Please advise

## 2012-10-11 ENCOUNTER — Other Ambulatory Visit: Payer: Self-pay | Admitting: *Deleted

## 2012-10-11 MED ORDER — TRAZODONE HCL 150 MG PO TABS: 150.0000 mg | ORAL_TABLET | Freq: Every day | ORAL | Status: DC

## 2012-10-14 ENCOUNTER — Other Ambulatory Visit: Payer: Self-pay

## 2012-10-21 ENCOUNTER — Ambulatory Visit (INDEPENDENT_AMBULATORY_CARE_PROVIDER_SITE_OTHER): Payer: Medicare Other | Admitting: Family Medicine

## 2012-10-21 ENCOUNTER — Encounter: Payer: Self-pay | Admitting: Family Medicine

## 2012-10-21 VITALS — BP 137/70 | HR 84 | Wt 184.0 lb

## 2012-10-21 DIAGNOSIS — M545 Low back pain, unspecified: Secondary | ICD-10-CM

## 2012-10-21 DIAGNOSIS — I10 Essential (primary) hypertension: Secondary | ICD-10-CM

## 2012-10-21 DIAGNOSIS — E119 Type 2 diabetes mellitus without complications: Secondary | ICD-10-CM

## 2012-10-21 DIAGNOSIS — H6120 Impacted cerumen, unspecified ear: Secondary | ICD-10-CM

## 2012-10-21 LAB — POCT GLYCOSYLATED HEMOGLOBIN (HGB A1C): Hemoglobin A1C: 6.9

## 2012-10-21 MED ORDER — AMITRIPTYLINE HCL 50 MG PO TABS: 50.0000 mg | ORAL_TABLET | Freq: Every day | ORAL | Status: DC

## 2012-10-21 NOTE — Progress Notes (Signed)
  Subjective:    Patient ID: Justin Goodman, male    DOB: Apr 26, 1955, 57 y.o.   MRN: [id number]  HPI DM- He has hit his medicare gap and now having to pay for his Lantus. No hypoglycemic events.  Now Lantus  HTN-  Pt denies chest pain, SOB, dizziness, or heart palpitations.  Taking meds as directed w/o problems.  Denies medication side effects.    He says his ears or 4 blocks again. He would like to have them cleaned out. Does seem to affect his hearing. We talked about doing at his last office visit and forgot to do it.  Review of Systems     Objective:   Physical Exam  Constitutional: He is oriented to person, place, and time. He appears well-developed and well-nourished.  HENT:  Head: Normocephalic and atraumatic.  Right Ear: External ear normal.  Left Ear: External ear normal.  Nose: Nose normal.  TMs blocked by cerumen.   Eyes: Conjunctivae and EOM are normal. Pupils are equal, round, and reactive to light.  Cardiovascular: Normal rate and normal heart sounds.   Pulmonary/Chest: Effort normal and breath sounds normal.  Neurological: He is alert and oriented to person, place, and time.  Skin: Skin is warm and dry.  Psychiatric: He has a normal mood and affect.          Assessment & Plan:  DM- Well controlled. Great job. Due for eye exam. He says he is waiting for card.  F/U in 3 month Since he is at his I did give him some samples of Levemir today. Explained it's not exactly equivalent to Lantus, but it is similar. He may then want to check on the cash Price of Lantus versus Levemir and let me know which is cheaper and we can send her a prescription. He'll be challenging for him to get his medication until he gets to the other side of his Medicare drug prescription gap. He is on an ARB.  S.E on stating.    HTN- well controlled. Continue current regimen.  Cerumen impaction - irrigated bilat. Pt tolerated well.

## 2012-11-11 ENCOUNTER — Other Ambulatory Visit: Payer: Self-pay | Admitting: *Deleted

## 2012-11-11 ENCOUNTER — Other Ambulatory Visit: Payer: Self-pay | Admitting: Family Medicine

## 2012-11-11 MED ORDER — METFORMIN HCL 1000 MG PO TABS: 1000.0000 mg | ORAL_TABLET | Freq: Two times a day (BID) | ORAL | Status: DC

## 2012-11-11 NOTE — Telephone Encounter (Signed)
I don't see benicar on med list.  I see metoprolol & losartan.

## 2012-11-15 ENCOUNTER — Other Ambulatory Visit: Payer: Self-pay | Admitting: *Deleted

## 2012-11-15 ENCOUNTER — Other Ambulatory Visit: Payer: Self-pay | Admitting: Family Medicine

## 2012-11-15 MED ORDER — METFORMIN HCL 1000 MG PO TABS: 1000.0000 mg | ORAL_TABLET | Freq: Two times a day (BID) | ORAL | Status: DC

## 2012-11-15 MED ORDER — OMEPRAZOLE 40 MG PO CPDR: 40.0000 mg | DELAYED_RELEASE_CAPSULE | Freq: Every day | ORAL | Status: DC

## 2012-11-15 MED ORDER — OLMESARTAN MEDOXOMIL 40 MG PO TABS: ORAL_TABLET | ORAL | Status: DC

## 2012-12-17 ENCOUNTER — Other Ambulatory Visit: Payer: Self-pay | Admitting: Sports Medicine

## 2013-01-21 ENCOUNTER — Encounter: Payer: Self-pay | Admitting: Family Medicine

## 2013-01-21 ENCOUNTER — Ambulatory Visit (INDEPENDENT_AMBULATORY_CARE_PROVIDER_SITE_OTHER): Payer: Medicare Other | Admitting: Family Medicine

## 2013-01-21 VITALS — BP 113/62 | HR 78 | Wt 181.0 lb

## 2013-01-21 DIAGNOSIS — J449 Chronic obstructive pulmonary disease, unspecified: Secondary | ICD-10-CM

## 2013-01-21 DIAGNOSIS — Z23 Encounter for immunization: Secondary | ICD-10-CM

## 2013-01-21 DIAGNOSIS — E119 Type 2 diabetes mellitus without complications: Secondary | ICD-10-CM

## 2013-01-21 DIAGNOSIS — I10 Essential (primary) hypertension: Secondary | ICD-10-CM

## 2013-01-21 MED ORDER — SITAGLIP PHOS-METFORMIN HCL ER 100-1000 MG PO TB24: 1.0000 | ORAL_TABLET | Freq: Every day | ORAL | Status: DC

## 2013-01-21 MED ORDER — INSULIN GLARGINE 100 UNIT/ML SOLOSTAR PEN: 0.4200 [IU] | PEN_INJECTOR | Freq: Every day | SUBCUTANEOUS | Status: DC

## 2013-01-21 MED ORDER — AMBULATORY NON FORMULARY MEDICATION: Status: DC

## 2013-01-21 NOTE — Progress Notes (Signed)
Subjective:    Patient ID: Justin Goodman, male    DOB: [birth year], 57 y.o.   MRN: [id number]  HPI DM- using Lantus 42 units in the AM.  On the metformin and amayrl.  He has not had any hypoglycemic events. His lowest blood sugar was actually this morning at 80. He did not feel lightheaded, sweaty or shaky. No wounds or sores that are not healing well. He is due for foot exam today.  HTN-  Pt denies chest pain, SOB, dizziness, or heart palpitations.  Taking meds as directed w/o problems.  Denies medication side effects.    Saw. Dr. Leanord Asal and hearing testing was [facility].    Asthma with COPD-he is on the [facility]. He is doing well. No recent exacerbations.   Review of Systems  BP 113/62  Pulse 78  Wt 181 lb (82.101 kg)  BMI 26.72 kg/m2    Allergies  Allergen Reactions  . Atorvastatin Other (See Comments)    HA   . Glipizide Other (See Comments)    HA  . Losartan Other (See Comments)    Vision changes, HA.   . Quinapril Cough    Past Medical History  Diagnosis Date  . Diabetes mellitus     w/ neuropathy  . COPD (chronic obstructive pulmonary disease)   . Smoker   . Hypertension   . Chronic back pain     only mild loss of T12-L1 disk ht on xray 1-12    No past surgical history on file.  History   Social History  . Marital Status: Divorced    Spouse Name: N/A    Number of Children: N/A  . Years of Education: N/A   Occupational History  . Not on file.   Social History Main Topics  . Smoking status: Former Smoker -- 0.00 packs/day    Types: Cigarettes  . Smokeless tobacco: Not on file  . Alcohol Use: No  . Drug Use: Not on file  . Sexual Activity: Not on file   Other Topics Concern  . Not on file   Social History Narrative  . No narrative on file    Family History  Problem Relation Age of Onset  . Diabetes Mother   . Alcohol abuse Father     liver    Outpatient Encounter Prescriptions as of 01/21/2013  Medication Sig Dispense Refill  .  AMBULATORY NON FORMULARY MEDICATION Medication Name: Glucometer with strips and lancet to test once a day (box of 100)  Dx 250.00 (diabetes.  1 Units  3  . amitriptyline (ELAVIL) 50 MG tablet Take 1 tablet (50 mg total) by mouth at bedtime.  30 tablet  3  . Fluticasone Furoate-Vilanterol (BREO ELLIPTA) 100-25 MCG/INH AEPB Inhale 1 puff into the lungs daily.  1 each  11  . gabapentin (NEURONTIN) 600 MG tablet TAKE TWO TABLETS BY MOUTH THREE TIMES DAILY  270 tablet  0  . glimepiride (AMARYL) 4 MG tablet Take 1 tablet (4 mg total) by mouth daily before breakfast.  90 tablet  PRN  . Insulin Glargine (LANTUS SOLOSTAR) 100 UNIT/ML SOPN Inject 0.42 Units into the skin daily.  25 pen  3  . Insulin Pen Needle (B-D ULTRAFINE III SHORT PEN) 31G X 8 MM MISC 1 Units by Does not apply route daily.  90 each  2  . Insulin Pen Needle 31G X 6 MM MISC 1 Units by Does not apply route daily.  90 each  0  . losartan (COZAAR)  100 MG tablet Take 1 tablet (100 mg total) by mouth daily.  90 tablet  1  . meloxicam (MOBIC) 15 MG tablet Take 1 tablet (15 mg total) by mouth daily.  90 tablet  3  . metoprolol (LOPRESSOR) 50 MG tablet Take 0.5 tablets (25 mg total) by mouth 2 (two) times daily.  180 tablet  PRN  . olmesartan (BENICAR) 40 MG tablet TAKE ONE TABLET BY MOUTH ONCE DAILY  30 tablet  3  . omeprazole (PRILOSEC) 40 MG capsule Take 1 capsule (40 mg total) by mouth daily.  90 capsule  0  . [DISCONTINUED] AMBULATORY NON FORMULARY MEDICATION Medication Name: Glucometer with strips and lancet to test once a day (box of 100)  Dx 250.00 (diabetes.  1 Units  3  . [DISCONTINUED] insulin glargine (LANTUS) 100 UNIT/ML injection Inject 0.42 mLs (42 Units total) into the skin daily.  25 pen  3  . [DISCONTINUED] metFORMIN (GLUCOPHAGE) 1000 MG tablet Take 1 tablet (1,000 mg total) by mouth 2 (two) times daily with a meal.  180 tablet  PRN  . albuterol (PROAIR HFA) 108 (90 BASE) MCG/ACT inhaler Inhale 2 puffs into the lungs every 6  (six) hours as needed for wheezing.  3 Inhaler  3  . SitaGLIPtin-MetFORMIN HCl (JANUMET XR) [id number] MG TB24 Take 1 tablet by mouth daily.  30 tablet  6   No facility-administered encounter medications on file as of 01/21/2013.          Objective:   Physical Exam  Constitutional: He is oriented to person, place, and time. He appears well-developed and well-nourished.  HENT:  Head: Normocephalic and atraumatic.  Cardiovascular: Normal rate, regular rhythm and normal heart sounds.   Pulmonary/Chest: Effort normal and breath sounds normal.  Neurological: He is alert and oriented to person, place, and time.  Skin: Skin is warm and dry.  Psychiatric: He has a normal mood and affect. His behavior is normal.          Assessment & Plan:  DM- uncontrolled. His A1c is typically under 7 and today it over 7. He says he's been going to the gym regularly and really has not changed anything. He's not sure why it might be elevated today. He did bring in his glucometer with him. The numbers definitely ranged from 80 to 190.  We will try changing him from metformin to Janumet. Prescription presented so that he can take it to the pharmacy to check on the cost. Is on his formulary but I'm not sure how much it might cost him. I did provide him with some samples today. Continue Lantus at current dose. Foot exam performed today. He'll be able to get his eye exam in February. Followup in 3 months. Lab Results  Component Value Date   HGBA1C 7.4 01/21/2013   HTN- well controlled.  Continue current regimen. Followup in 3 months.  Asthma/COPD-continue current regimen. He's overall doing well. He's working out regularly.  Flu vaccine given today.

## 2013-02-15 ENCOUNTER — Other Ambulatory Visit: Payer: Self-pay | Admitting: Family Medicine

## 2013-02-15 ENCOUNTER — Telehealth: Payer: Self-pay | Admitting: *Deleted

## 2013-02-21 NOTE — Telephone Encounter (Signed)
error 

## 2013-02-23 ENCOUNTER — Other Ambulatory Visit: Payer: Self-pay | Admitting: Family Medicine

## 2013-02-23 ENCOUNTER — Other Ambulatory Visit: Payer: Self-pay | Admitting: *Deleted

## 2013-02-23 MED ORDER — OMEPRAZOLE 40 MG PO CPDR: DELAYED_RELEASE_CAPSULE | ORAL | Status: DC

## 2013-03-11 ENCOUNTER — Encounter: Payer: Self-pay | Admitting: Physician Assistant

## 2013-03-11 ENCOUNTER — Ambulatory Visit (INDEPENDENT_AMBULATORY_CARE_PROVIDER_SITE_OTHER): Payer: Medicare Other | Admitting: Physician Assistant

## 2013-03-11 VITALS — BP 129/71 | HR 85 | Temp 98.0°F | Wt 187.0 lb

## 2013-03-11 DIAGNOSIS — J449 Chronic obstructive pulmonary disease, unspecified: Secondary | ICD-10-CM

## 2013-03-11 DIAGNOSIS — J441 Chronic obstructive pulmonary disease with (acute) exacerbation: Secondary | ICD-10-CM

## 2013-03-11 DIAGNOSIS — J440 Chronic obstructive pulmonary disease with acute lower respiratory infection: Secondary | ICD-10-CM

## 2013-03-11 MED ORDER — AZITHROMYCIN 250 MG PO TABS: ORAL_TABLET | ORAL | Status: DC

## 2013-03-11 MED ORDER — METHYLPREDNISOLONE SODIUM SUCC 125 MG IJ SOLR
125.0000 mg | Freq: Once | INTRAMUSCULAR | Status: AC
  Administered 2013-03-11: 125 mg via INTRAMUSCULAR

## 2013-03-11 NOTE — Patient Instructions (Addendum)
Sent zpak to pharmacy. Call if not improving by Monday.   Acute Bronchitis Bronchitis is inflammation of the airways that extend from the windpipe into the lungs (bronchi). The inflammation often causes mucus to develop. This leads to a cough, which is the most common symptom of bronchitis.  In acute bronchitis, the condition usually develops suddenly and goes away over time, usually in a couple weeks. Smoking, allergies, and asthma can make bronchitis worse. Repeated episodes of bronchitis may cause further lung problems.  CAUSES Acute bronchitis is most often caused by the same virus that causes a cold. The virus can spread from person to person (contagious).  SIGNS AND SYMPTOMS   Cough.   Fever.   Coughing up mucus.   Body aches.   Chest congestion.   Chills.   Shortness of breath.   Sore throat.  DIAGNOSIS  Acute bronchitis is usually diagnosed through a physical exam. Tests, such as chest X-rays, are sometimes done to rule out other conditions.  TREATMENT  Acute bronchitis usually goes away in a couple weeks. Often times, no medical treatment is necessary. Medicines are sometimes given for relief of fever or cough. Antibiotics are usually not needed but may be prescribed in certain situations. In some cases, an inhaler may be recommended to help reduce shortness of breath and control the cough. A cool mist vaporizer may also be used to help thin bronchial secretions and make it easier to clear the chest.  HOME CARE INSTRUCTIONS  Get plenty of rest.   Drink enough fluids to keep your urine clear or pale yellow (unless you have a medical condition that requires fluid restriction). Increasing fluids may help thin your secretions and will prevent dehydration.   Only take over-the-counter or prescription medicines as directed by your health care provider.   Avoid smoking and secondhand smoke. Exposure to cigarette smoke or irritating chemicals will make bronchitis  worse. If you are a smoker, consider using nicotine gum or skin patches to help control withdrawal symptoms. Quitting smoking will help your lungs heal faster.   Reduce the chances of another bout of acute bronchitis by washing your hands frequently, avoiding people with cold symptoms, and trying not to touch your hands to your mouth, nose, or eyes.   Follow up with your health care provider as directed.  SEEK MEDICAL CARE IF: Your symptoms do not improve after 1 week of treatment.  SEEK IMMEDIATE MEDICAL CARE IF:  You develop an increased fever or chills.   You have chest pain.   You have severe shortness of breath.  You have bloody sputum.   You develop dehydration.  You develop fainting.  You develop repeated vomiting.  You develop a severe headache. MAKE SURE YOU:   Understand these instructions.  Will watch your condition.  Will get help right away if you are not doing well or get worse. Document Released: 05/01/2004 Document Revised: 11/24/2012 Document Reviewed: 09/14/2012 [facility] Patient Information 2014 [place], [place].

## 2013-03-11 NOTE — Progress Notes (Signed)
   Subjective:    Patient ID: Justin Goodman, male    DOB: 1956-02-10, 57 y.o.   MRN: [id number]  HPI Pt is a 57 yo male who presents to the clinic with productive cough, body aches, fatigue for 4 weeks off and on but 2 weeks much worse. Pt has hx of COPD/asthma variant and uses BREO daily. He has had to increase use of albuterol inhaler. Denies any fever. He is concerned because he feels like he started feeling bad after flu shot. No nausea of vomiting. Cough is yellow and white. Denies any sinus pressure or ear pain. No problems urinating. Wheezing is worse at night. Tried OTC mucinex not helping.      Review of Systems     Objective:   Physical Exam  Constitutional: He is oriented to person, place, and time. He appears well-developed and well-nourished.  HENT:  Head: Normocephalic and atraumatic.  Right Ear: External ear normal.  Left Ear: External ear normal.  Nose: Nose normal.  Mouth/Throat: Oropharynx is clear and moist.  TM's clear.   no maxillary or frontal sinus tenderness.   Eyes: Conjunctivae are normal. Right eye exhibits no discharge. Left eye exhibits no discharge.  Neck: Normal range of motion. Neck supple.  Cardiovascular: Normal rate, regular rhythm and normal heart sounds.   Pulmonary/Chest:  Decreased effort. Coarse breath sounds. Rhonchi throughout both lungs.   Lymphadenopathy:    He has no cervical adenopathy.  Neurological: He is alert and oriented to person, place, and time.  Skin: Skin is warm and dry.  Psychiatric: He has a normal mood and affect. His behavior is normal.          Assessment & Plan:  Acute Bronchitis/COPD exacerbation- refilled albuterol inhaler. Gave shot of solumedrol 125mg  in office today. Start zpak. Continue on BREO. Mucinex continue. Call if not improving.

## 2013-03-14 ENCOUNTER — Other Ambulatory Visit: Payer: Self-pay | Admitting: Family Medicine

## 2013-03-14 ENCOUNTER — Other Ambulatory Visit: Payer: Self-pay | Admitting: Sports Medicine

## 2013-04-08 ENCOUNTER — Other Ambulatory Visit: Payer: Self-pay | Admitting: *Deleted

## 2013-04-08 DIAGNOSIS — G8929 Other chronic pain: Secondary | ICD-10-CM

## 2013-04-08 MED ORDER — MELOXICAM 15 MG PO TABS: 15.0000 mg | ORAL_TABLET | Freq: Every day | ORAL | Status: DC

## 2013-04-09 ENCOUNTER — Other Ambulatory Visit: Payer: Self-pay | Admitting: Family Medicine

## 2013-04-11 ENCOUNTER — Ambulatory Visit (INDEPENDENT_AMBULATORY_CARE_PROVIDER_SITE_OTHER): Payer: Medicare Other | Admitting: Family Medicine

## 2013-04-11 ENCOUNTER — Encounter: Payer: Self-pay | Admitting: Family Medicine

## 2013-04-11 ENCOUNTER — Other Ambulatory Visit: Payer: Self-pay | Admitting: *Deleted

## 2013-04-11 VITALS — BP 134/74 | HR 82 | Temp 98.0°F | Ht 69.0 in | Wt 191.0 lb

## 2013-04-11 DIAGNOSIS — I10 Essential (primary) hypertension: Secondary | ICD-10-CM

## 2013-04-11 DIAGNOSIS — E119 Type 2 diabetes mellitus without complications: Secondary | ICD-10-CM

## 2013-04-11 DIAGNOSIS — J209 Acute bronchitis, unspecified: Secondary | ICD-10-CM

## 2013-04-11 DIAGNOSIS — J019 Acute sinusitis, unspecified: Secondary | ICD-10-CM

## 2013-04-11 LAB — POCT UA - MICROALBUMIN
CREATININE, POC: 10 mg/dL
Microalbumin Ur, POC: 10 mg/L

## 2013-04-11 LAB — POCT GLYCOSYLATED HEMOGLOBIN (HGB A1C): HEMOGLOBIN A1C: 8

## 2013-04-11 MED ORDER — GABAPENTIN 600 MG PO TABS: ORAL_TABLET | ORAL | Status: DC

## 2013-04-11 MED ORDER — INSULIN GLARGINE 100 UNIT/ML SOLOSTAR PEN: 0.4200 [IU] | PEN_INJECTOR | Freq: Every day | SUBCUTANEOUS | Status: DC

## 2013-04-11 MED ORDER — GLIMEPIRIDE 4 MG PO TABS: 4.0000 mg | ORAL_TABLET | Freq: Every day | ORAL | Status: DC

## 2013-04-11 MED ORDER — METOPROLOL TARTRATE 50 MG PO TABS: 25.0000 mg | ORAL_TABLET | Freq: Two times a day (BID) | ORAL | Status: DC

## 2013-04-11 MED ORDER — DOXYCYCLINE HYCLATE 100 MG PO TABS: 100.0000 mg | ORAL_TABLET | Freq: Two times a day (BID) | ORAL | Status: DC

## 2013-04-11 MED ORDER — PREDNISONE 20 MG PO TABS
40.0000 mg | ORAL_TABLET | Freq: Every day | ORAL | Status: DC
Start: 2013-04-11 — End: 2013-07-12

## 2013-04-11 MED ORDER — INSULIN PEN NEEDLE 31G X 8 MM MISC: 1.0000 [IU] | Freq: Every day | Status: DC

## 2013-04-11 MED ORDER — CANAGLIFLOZIN 100 MG PO TABS: 1.0000 | ORAL_TABLET | Freq: Every day | ORAL | Status: DC

## 2013-04-11 MED ORDER — INSULIN GLARGINE 100 UNIT/ML SOLOSTAR PEN: 42.0000 [IU] | PEN_INJECTOR | Freq: Every day | SUBCUTANEOUS | Status: DC

## 2013-04-11 MED ORDER — LOSARTAN POTASSIUM 100 MG PO TABS: 100.0000 mg | ORAL_TABLET | Freq: Every day | ORAL | Status: DC

## 2013-04-11 NOTE — Progress Notes (Signed)
   Subjective:    Patient ID: Justin Goodman, male    DOB: 1955/06/23, 58 y.o.   MRN: [id number]  HPI  Diabetes - no hypoglycemic events. No wounds or sores that are not healing well. No increased thirst or urination. He has been unable to check his glucose at home and has been having problems with his glucometer. He brought in with him today. We did look online and it looks like it may be a problem with the strips. He says he does have a new box of strips and says she will try those. If that does not work then he will call us back.. Taking medications as prescribed without any side effects. On Janumet, glimepiride, Lantus. He is getting over a viral illness that he had over the holidays. He has not been able to exercise during that time but plans on getting back to it.   He's been short of breath with wheezing for approximately 3 weeks. He's had a cough with light green productive sputum. No fever chills or sweats. He's also had some sinus congestion. He was placed on antibiotics about 3 weeks ago by his pulmonologist. He believes it was azithromycin. He never really got completely better.  Hypertension- Pt denies chest pain, SOB, dizziness, or heart palpitations.  Taking meds as directed w/o problems.  Denies medication side effects.     Review of Systems     Objective:   Physical Exam  Constitutional: He is oriented to person, place, and time. He appears well-developed and well-nourished.  HENT:  Head: Normocephalic and atraumatic.  Cardiovascular: Normal rate, regular rhythm and normal heart sounds.   Pulmonary/Chest: Effort normal and breath sounds normal.  Neurological: He is alert and oriented to person, place, and time.  Skin: Skin is warm and dry.  Psychiatric: He has a normal mood and affect. His behavior is normal.          Assessment & Plan:   diabetes-uncontrolled. Discussed different options. We could increase his Lantus or possibly try one of the newer agents such as  Invokana. He is willing to try it but wants to see what the cost would be at the pharmacy as his Medicare. He will call me and let me know if too expensive.  If too expensive then we will try increasing his insulin to 50 units, and if still not well controlled possibly 55 units. Continue with healthy diet. And get back into regular exercise routine. F/U in 3-4 months.   Acute bronchitis/sinusitis- will tx with doxy and prednisone. Continue to be liberal with albuterol.. Call if not better in one week.   Hypertension-well-controlled. Continue current regimen.

## 2013-04-11 NOTE — Patient Instructions (Addendum)
Can have your insurance run the cost of the  Renonvokana . This is the new diabetic drug. If it's too costly and did not fill it. And then call me and I can adjust her insulin instead. We can increase your lantus to 50 units and then if numbers still running high can increase to 55 units daily.  I also sent her for new perception for doxycycline, an antibiotic for bronchitis and sinusitis. Call if you are not significantly better in one week. Please use your albuterol every 4-6 hours to help keep your lungs and then. I will also send her for a short course of prednisone.

## 2013-04-14 ENCOUNTER — Other Ambulatory Visit: Payer: Self-pay | Admitting: Family Medicine

## 2013-04-14 NOTE — Telephone Encounter (Signed)
Are you ok with me filling this?

## 2013-04-14 NOTE — Telephone Encounter (Signed)
Ok to fill 

## 2013-04-21 ENCOUNTER — Ambulatory Visit: Payer: Medicare Other | Admitting: Family Medicine

## 2013-04-28 ENCOUNTER — Other Ambulatory Visit: Payer: Self-pay | Admitting: *Deleted

## 2013-04-28 MED ORDER — INSULIN GLARGINE 100 UNIT/ML ~~LOC~~ SOLN: 42.0000 [IU] | Freq: Every day | SUBCUTANEOUS | Status: DC

## 2013-04-28 MED ORDER — "INSULIN SYRINGE-NEEDLE U-100 30G X 1/2"" 1 ML MISC": Status: DC

## 2013-05-02 ENCOUNTER — Other Ambulatory Visit: Payer: Self-pay | Admitting: Family Medicine

## 2013-05-05 ENCOUNTER — Other Ambulatory Visit: Payer: Self-pay | Admitting: Family Medicine

## 2013-05-11 ENCOUNTER — Other Ambulatory Visit: Payer: Self-pay | Admitting: Family Medicine

## 2013-06-13 ENCOUNTER — Other Ambulatory Visit: Payer: Self-pay | Admitting: Family Medicine

## 2013-06-16 ENCOUNTER — Other Ambulatory Visit: Payer: Self-pay | Admitting: Family Medicine

## 2013-07-12 ENCOUNTER — Encounter: Payer: Self-pay | Admitting: Family Medicine

## 2013-07-12 ENCOUNTER — Ambulatory Visit (INDEPENDENT_AMBULATORY_CARE_PROVIDER_SITE_OTHER): Payer: Medicare Other | Admitting: Family Medicine

## 2013-07-12 VITALS — BP 137/70 | HR 98 | Wt 180.0 lb

## 2013-07-12 DIAGNOSIS — E119 Type 2 diabetes mellitus without complications: Secondary | ICD-10-CM

## 2013-07-12 DIAGNOSIS — I10 Essential (primary) hypertension: Secondary | ICD-10-CM

## 2013-07-12 LAB — POCT GLYCOSYLATED HEMOGLOBIN (HGB A1C): HEMOGLOBIN A1C: 7.4

## 2013-07-12 MED ORDER — AMITRIPTYLINE HCL 50 MG PO TABS: ORAL_TABLET | ORAL | Status: DC

## 2013-07-12 MED ORDER — CANAGLIFLOZIN 300 MG PO TABS: 1.0000 | ORAL_TABLET | Freq: Every day | ORAL | Status: DC

## 2013-07-12 NOTE — Progress Notes (Signed)
Spoke w/Sabrina @ [facility] to get eye exam she will fax.Justin PacasBarkley, Justin Goodman [place]

## 2013-07-12 NOTE — Progress Notes (Signed)
   Subjective:    Patient ID: Justin Goodman, male    DOB: Nov 15, 1955, 58 y.o.   MRN: [id number]  HPI Diabetes - no hypoglycemic events. No wounds or sores that are not healing well. No increased thirst or urination. Checking glucose at home. Taking medications as prescribed without any side effects. He is on Lantus. He has been losing weight and has been exercising.    Hypertension- Pt denies chest pain, SOB, dizziness, or heart palpitations.  Taking meds as directed w/o problems.  Denies medication side effects.     Review of Systems     Objective:   Physical Exam  Constitutional: He is oriented to person, place, and time. He appears well-developed and well-nourished.  HENT:  Head: Normocephalic and atraumatic.  Cardiovascular: Normal rate, regular rhythm and normal heart sounds.   Pulmonary/Chest: Effort normal and breath sounds normal.  Neurological: He is alert and oriented to person, place, and time.  Skin: Skin is warm and dry.  Psychiatric: He has a normal mood and affect. His behavior is normal.          Assessment & Plan:  Diabetes- improved from last time. A1c was 8.0 before. He is making great progress has lost weight and is doing fantastic. I would like to try increasing his Invokana from 100 mg 300 mg to see if we can get his A1c under 7. Continue Lantus 50 units daily. As well as the Janumet Bexxar. Call if having any problems. Otherwise I will see him back in 3 months. He did have his eye exam and x-ray has new glasses today. We'll call to get the report. Lab Results  Component Value Date   HGBA1C 7.4 07/12/2013   HTN - well controlled. Continue current regimen. Followup in 3 months. Due for CMP and fasting lipid panel.  Hyperlipidemia-due to repeat lipid panel. He was intolerant to Lipitor in a small triangular medications at this point.

## 2013-07-13 ENCOUNTER — Other Ambulatory Visit: Payer: Self-pay | Admitting: Family Medicine

## 2013-08-01 ENCOUNTER — Other Ambulatory Visit: Payer: Self-pay | Admitting: Family Medicine

## 2013-09-02 ENCOUNTER — Encounter: Payer: Self-pay | Admitting: Physician Assistant

## 2013-09-02 ENCOUNTER — Ambulatory Visit (INDEPENDENT_AMBULATORY_CARE_PROVIDER_SITE_OTHER): Payer: Medicare Other | Admitting: Physician Assistant

## 2013-09-02 VITALS — BP 108/61 | HR 87 | Ht 69.0 in | Wt 177.0 lb

## 2013-09-02 DIAGNOSIS — H6123 Impacted cerumen, bilateral: Secondary | ICD-10-CM

## 2013-09-02 DIAGNOSIS — H6120 Impacted cerumen, unspecified ear: Secondary | ICD-10-CM

## 2013-09-02 NOTE — Progress Notes (Signed)
   Subjective:    Patient ID: Justin Goodman, male    DOB: 12/22/1955, 58 y.o.   MRN: [id number]  HPI Patient comes in to have bilateral ears evaluated. He has a history of cerumen impaction. He has noticed some hearing loss over the past couple weeks. He denies any ear pain, sinus pressure, sore throat, fever or chills.   Review of Systems  All other systems reviewed and are negative.      Objective:   Physical Exam  Constitutional: He appears well-developed and well-nourished.  HENT:  Head: Normocephalic and atraumatic.  Bilateral ears impacted with cerumen.  After irrigation TMs were clear. No blood or pus noted.  Psychiatric: He has a normal mood and affect. His behavior is normal.          Assessment & Plan:  Bilateral cerumen impaction-nurse irrigated ears with success. Patient's hearing improved significantly. Follow up as needed.

## 2013-09-16 ENCOUNTER — Other Ambulatory Visit: Payer: Self-pay | Admitting: Family Medicine

## 2013-10-10 ENCOUNTER — Other Ambulatory Visit: Payer: Self-pay | Admitting: Family Medicine

## 2013-10-11 ENCOUNTER — Ambulatory Visit (INDEPENDENT_AMBULATORY_CARE_PROVIDER_SITE_OTHER): Payer: Medicare Other | Admitting: Family Medicine

## 2013-10-11 ENCOUNTER — Encounter: Payer: Self-pay | Admitting: Family Medicine

## 2013-10-11 VITALS — BP 105/54 | HR 82 | Ht 69.0 in | Wt 174.0 lb

## 2013-10-11 DIAGNOSIS — E785 Hyperlipidemia, unspecified: Secondary | ICD-10-CM

## 2013-10-11 DIAGNOSIS — J449 Chronic obstructive pulmonary disease, unspecified: Secondary | ICD-10-CM

## 2013-10-11 DIAGNOSIS — I10 Essential (primary) hypertension: Secondary | ICD-10-CM

## 2013-10-11 DIAGNOSIS — E119 Type 2 diabetes mellitus without complications: Secondary | ICD-10-CM

## 2013-10-11 LAB — POCT GLYCOSYLATED HEMOGLOBIN (HGB A1C): HEMOGLOBIN A1C: 7.3

## 2013-10-11 NOTE — Progress Notes (Signed)
   Subjective:    Patient ID: Justin Goodman, male    DOB: 31-May-1955, 58 y.o.   MRN: [id number]  HPI Diabetes - no hypoglycemic events. No wounds or sores that are not healing well. No increased thirst or urination. Checking glucose at home. Taking medications as prescribed without any side effects.  Hypertension- Pt denies chest pain, SOB, dizziness, or heart palpitations.  Taking meds as directed w/o problems.  Denies medication side effects.    Asthma with COPD - no recent flares or exacerbations.  Review of Systems     Objective:   Physical Exam  Constitutional: He is oriented to person, place, and time. He appears well-developed and well-nourished.  HENT:  Head: Normocephalic and atraumatic.  Cardiovascular: Normal rate, regular rhythm and normal heart sounds.   Pulmonary/Chest: Effort normal and breath sounds normal.  Neurological: He is alert and oriented to person, place, and time.  Skin: Skin is warm and dry.  Psychiatric: He has a normal mood and affect. His behavior is normal.          Assessment & Plan:  Diabetes-uncontrolled. Hemoglobin A1c 7.3 today. Last was 7.4 so he has had some mild improvement. Increase lantus 30 units twice a day. On an ARB. Intolerant to statins.  F/U in 3 months.   Hypertension-well-controlled on current regimen.  Asthma with COPD-well-controlled on current regimen. He's on Breo. No recent flares.

## 2013-10-12 MED ORDER — LOSARTAN POTASSIUM 50 MG PO TABS: ORAL_TABLET | ORAL | Status: DC

## 2013-10-12 MED ORDER — GABAPENTIN 600 MG PO TABS: ORAL_TABLET | ORAL | Status: DC

## 2013-10-12 NOTE — Addendum Note (Signed)
Addended by: Deno EtienneBARKLEY, Atia Haupt L on: 10/12/2013 09:46 AM   Modules accepted: Orders, Medications

## 2013-10-21 LAB — COMPLETE METABOLIC PANEL WITH GFR
ALK PHOS: 60 U/L (ref 39–117)
ALT: 17 U/L (ref 0–53)
AST: 15 U/L (ref 0–37)
Albumin: 4.2 g/dL (ref 3.5–5.2)
BILIRUBIN TOTAL: 0.4 mg/dL (ref 0.2–1.2)
BUN: 10 mg/dL (ref 6–23)
CO2: 25 mEq/L (ref 19–32)
Calcium: 9.6 mg/dL (ref 8.4–10.5)
Chloride: 99 mEq/L (ref 96–112)
Creat: 0.81 mg/dL (ref 0.50–1.35)
GFR, Est African American: >89 mL/min
GFR, Est Non African American: >89 mL/min
GLUCOSE: 308 mg/dL — AB (ref 70–99)
Potassium: 4.4 mEq/L (ref 3.5–5.3)
SODIUM: 135 meq/L (ref 135–145)
Total Protein: 7.1 g/dL (ref 6.0–8.3)

## 2013-10-21 LAB — LIPID PANEL
CHOL/HDL RATIO: 6.7 ratio
Cholesterol: 173 mg/dL (ref 0–200)
HDL: 26 mg/dL — AB (ref >39)
Triglycerides: 579 mg/dL — ABNORMAL HIGH (ref <150)

## 2013-10-24 ENCOUNTER — Other Ambulatory Visit: Payer: Self-pay | Admitting: Family Medicine

## 2013-10-24 ENCOUNTER — Other Ambulatory Visit: Payer: Self-pay | Admitting: *Deleted

## 2013-10-24 DIAGNOSIS — E785 Hyperlipidemia, unspecified: Secondary | ICD-10-CM

## 2013-10-24 MED ORDER — SIMVASTATIN 40 MG PO TABS: 40.0000 mg | ORAL_TABLET | Freq: Every day | ORAL | Status: DC

## 2013-11-21 ENCOUNTER — Telehealth: Payer: Self-pay | Admitting: *Deleted

## 2013-11-21 NOTE — Telephone Encounter (Signed)
Pt states that the new med simvastatin makes him dizzy. He noticed this after the second day he has d/c'd this med. He reports that he put milk in his coffee the day the that he had his labs drawn and would like repeat of lipid because of this.

## 2013-11-22 NOTE — Telephone Encounter (Signed)
Called and informed pt of Dr. Metheney's recommendations.Justin Goodman [name]  

## 2013-11-22 NOTE — Telephone Encounter (Signed)
Did he. take the simvastatin at bedtime? DD try taking a half of a tab? If not please have him try this. Also okay to repeat lipid panel.

## 2013-11-30 ENCOUNTER — Other Ambulatory Visit: Payer: Self-pay | Admitting: Family Medicine

## 2013-12-15 ENCOUNTER — Other Ambulatory Visit: Payer: Self-pay | Admitting: Family Medicine

## 2013-12-28 LAB — LIPID PANEL
CHOL/HDL RATIO: 3.2 ratio
CHOLESTEROL: 99 mg/dL (ref 0–200)
HDL: 31 mg/dL — ABNORMAL LOW (ref >39)
LDL Cholesterol: 49 mg/dL (ref 0–99)
TRIGLYCERIDES: 93 mg/dL (ref <150)
VLDL: 19 mg/dL (ref 0–40)

## 2014-01-06 ENCOUNTER — Other Ambulatory Visit: Payer: Self-pay | Admitting: Family Medicine

## 2014-01-10 ENCOUNTER — Other Ambulatory Visit: Payer: Self-pay | Admitting: *Deleted

## 2014-01-10 ENCOUNTER — Ambulatory Visit (INDEPENDENT_AMBULATORY_CARE_PROVIDER_SITE_OTHER): Payer: Medicare Other | Admitting: Family Medicine

## 2014-01-10 ENCOUNTER — Encounter: Payer: Self-pay | Admitting: Family Medicine

## 2014-01-10 VITALS — BP 112/62 | HR 93 | Temp 98.1°F | Ht 69.0 in | Wt 176.0 lb

## 2014-01-10 DIAGNOSIS — E1142 Type 2 diabetes mellitus with diabetic polyneuropathy: Secondary | ICD-10-CM

## 2014-01-10 DIAGNOSIS — G629 Polyneuropathy, unspecified: Secondary | ICD-10-CM

## 2014-01-10 DIAGNOSIS — E785 Hyperlipidemia, unspecified: Secondary | ICD-10-CM

## 2014-01-10 DIAGNOSIS — E1140 Type 2 diabetes mellitus with diabetic neuropathy, unspecified: Secondary | ICD-10-CM

## 2014-01-10 DIAGNOSIS — E1342 Other specified diabetes mellitus with diabetic polyneuropathy: Secondary | ICD-10-CM

## 2014-01-10 DIAGNOSIS — I10 Essential (primary) hypertension: Secondary | ICD-10-CM

## 2014-01-10 DIAGNOSIS — Z23 Encounter for immunization: Secondary | ICD-10-CM

## 2014-01-10 LAB — POCT GLYCOSYLATED HEMOGLOBIN (HGB A1C): Hemoglobin A1C: 6.5

## 2014-01-10 NOTE — Patient Instructions (Signed)
Decrease Lantus to 52 units once a day.

## 2014-01-10 NOTE — Assessment & Plan Note (Signed)
Discussed the importance of being on a statin.  Encouraged him to restart this medication.  Explained that the reason his cholesterol looks like it is because he is taking his statin. Fortunately he has tolerated the simvastatin so far.

## 2014-01-10 NOTE — Progress Notes (Signed)
   Subjective:    Patient ID: Justin ChimesMichael Goodman, male    DOB: [birth year], 58 y.o.   MRN: [id number]  HPI Diabetes - + hypoglycemic events. Went down to 40 and fell and hit his left arm. Has an abrasion.  No wounds or sores that are not healing well. No increased thirst or urination. Checking glucose at home. Taking medications as prescribed without any side effects. Last A1C was 7.3.    Diabetic peripheral neuropathy-he wanted at the gabapentin was affecting his blood sugars so he decreased his dose from 6 tabs down to 4 tabs per day. He says he really hasn't noticed much difference.  Hypertension- Pt denies chest pain, SOB, dizziness, or heart palpitations.  Taking meds as directed w/o problems.  Denies medication side effects.    Hyperlipidemia-patient says that since he received a call back stating that his lipids looked good on the simvastatin 40 mg but he decided to stop taking it because he didn't think he needed it anymore. He has tolerated this better than Lipitor which he had headaches with.  Review of Systems     Objective:   Physical Exam  Constitutional: He is oriented to person, place, and time. He appears well-developed and well-nourished.  HENT:  Head: Normocephalic and atraumatic.  Cardiovascular: Normal rate, regular rhythm and normal heart sounds.   Pulmonary/Chest: Effort normal and breath sounds normal.  Neurological: He is alert and oriented to person, place, and time.  Skin: Skin is warm and dry.  Psychiatric: He has a normal mood and affect. His behavior is normal.          Assessment & Plan:  Flu vaccine given today.

## 2014-01-10 NOTE — Assessment & Plan Note (Signed)
Under fair control. He decreased his gabapentin to 4 tabs a day from 6 times a day. He hasn't noticed much difference. So encouraged him to stay with 4 tabs a day if it seems to be controlling his symptoms. It would minimize the side effects and sedation

## 2014-01-10 NOTE — Assessment & Plan Note (Signed)
Well controlled today. Continue current regimen. 

## 2014-01-10 NOTE — Assessment & Plan Note (Addendum)
A1C is at goal today.  F/U in 3 months. Foot exam performed today. He does have callouses.  Has neuropathy

## 2014-01-31 ENCOUNTER — Telehealth: Payer: Self-pay | Admitting: *Deleted

## 2014-01-31 NOTE — Telephone Encounter (Signed)
That is ok. If upsets stomach then can stop if needed.

## 2014-01-31 NOTE — Telephone Encounter (Signed)
Pt lvm stating that the nurse from his insurance company advised that he take a baby asprin and he would like to know if this is ok. He does have hyperlipidemia his last lipid his total cholesterol was 99 hdl-31 and ldl-49 Please advise.Loralee PacasBarkley, Dorion Petillo [place]

## 2014-02-01 ENCOUNTER — Telehealth: Payer: Self-pay | Admitting: *Deleted

## 2014-02-01 DIAGNOSIS — E785 Hyperlipidemia, unspecified: Secondary | ICD-10-CM

## 2014-02-01 DIAGNOSIS — J449 Chronic obstructive pulmonary disease, unspecified: Secondary | ICD-10-CM

## 2014-02-01 MED ORDER — ASPIRIN EC 81 MG PO TBEC: 81.0000 mg | DELAYED_RELEASE_TABLET | Freq: Every day | ORAL | Status: DC

## 2014-02-01 MED ORDER — ALBUTEROL SULFATE HFA 108 (90 BASE) MCG/ACT IN AERS: 2.0000 | INHALATION_SPRAY | Freq: Four times a day (QID) | RESPIRATORY_TRACT | Status: DC | PRN

## 2014-02-01 NOTE — Telephone Encounter (Signed)
Spoke w/Ms. Ashley RoyaltyMatthews the Nurse practitioner that is working with pt she called and lvm stating that he will need a rescue Inhaler and also for the asprin regimen. She said that the pt informed her that I sent in both rx's for him. Attempted to call pt and inform him of this did not get an answer he does not have a vm setup .Loralee PacasBarkley, Kayci [place]ville [place]Lynetta

## 2014-02-01 NOTE — Telephone Encounter (Signed)
Pt informed of recommendations.Justin Goodman  

## 2014-02-06 ENCOUNTER — Ambulatory Visit (INDEPENDENT_AMBULATORY_CARE_PROVIDER_SITE_OTHER): Payer: Medicare Other | Admitting: Family Medicine

## 2014-02-06 ENCOUNTER — Encounter: Payer: Self-pay | Admitting: Family Medicine

## 2014-02-06 DIAGNOSIS — M791 Myalgia: Secondary | ICD-10-CM

## 2014-02-06 DIAGNOSIS — M609 Myositis, unspecified: Secondary | ICD-10-CM

## 2014-02-06 DIAGNOSIS — E1140 Type 2 diabetes mellitus with diabetic neuropathy, unspecified: Secondary | ICD-10-CM

## 2014-02-06 DIAGNOSIS — IMO0001 Reserved for inherently not codable concepts without codable children: Secondary | ICD-10-CM

## 2014-02-06 DIAGNOSIS — J441 Chronic obstructive pulmonary disease with (acute) exacerbation: Secondary | ICD-10-CM

## 2014-02-06 DIAGNOSIS — E1142 Type 2 diabetes mellitus with diabetic polyneuropathy: Secondary | ICD-10-CM

## 2014-02-06 LAB — CBC WITH DIFFERENTIAL/PLATELET
BASOS ABS: 0 10*3/uL (ref 0.0–0.1)
Basophils Relative: 1 % (ref 0–1)
EOS ABS: 0 10*3/uL (ref 0.0–0.7)
Eosinophils Relative: 1 % (ref 0–5)
HCT: 44.4 % (ref 39.0–52.0)
Hemoglobin: 15.3 g/dL (ref 13.0–17.0)
Lymphocytes Relative: 37 % (ref 12–46)
Lymphs Abs: 1.4 10*3/uL (ref 0.7–4.0)
MCH: 28.9 pg (ref 26.0–34.0)
MCHC: 34.5 g/dL (ref 30.0–36.0)
MCV: 83.9 fL (ref 78.0–100.0)
Monocytes Absolute: 0.4 10*3/uL (ref 0.1–1.0)
Monocytes Relative: 9 % (ref 3–12)
NEUTROS ABS: 2 10*3/uL (ref 1.7–7.7)
NEUTROS PCT: 52 % (ref 43–77)
Platelets: 171 10*3/uL (ref 150–400)
RBC: 5.29 MIL/uL (ref 4.22–5.81)
RDW: 13.8 % (ref 11.5–15.5)
WBC: 3.9 10*3/uL — ABNORMAL LOW (ref 4.0–10.5)

## 2014-02-06 LAB — POCT UA - MICROALBUMIN
Creatinine, POC: 50 mg/dL
MICROALBUMIN (UR) POC: 30 mg/L

## 2014-02-06 MED ORDER — AZITHROMYCIN 250 MG PO TABS: ORAL_TABLET | ORAL | Status: DC

## 2014-02-06 MED ORDER — PREGABALIN 100 MG PO CAPS: 100.0000 mg | ORAL_CAPSULE | Freq: Three times a day (TID) | ORAL | Status: DC

## 2014-02-06 MED ORDER — PREDNISONE 20 MG PO TABS: 40.0000 mg | ORAL_TABLET | Freq: Every day | ORAL | Status: DC

## 2014-02-06 NOTE — Progress Notes (Signed)
Subjective:    Patient ID: Justin Goodman, male    DOB: 1955-04-29, 58 y.o.   MRN: [id number]  HPI Burning in the feet  And from shoulders to elbows bilaterally x 1 week. In his feet has had before on and off x 1 year.  Burning is completley constant. No relief. Walking makes it worse.  Feels really fatigued bc the pain is waking him up at night.  Has quit going to the gym. Says his muscle sin her arms hurt.  Stopped his gabapentin abruptly about 2 days ago.    His cough is worse and has a runny nose. Getting increase in sputum, it is yellow.   Feels like his chest is tight.  No fever, chills or sweat.  No nasal congestion. No ear pain or pressure.    Review of Systems  There were no vitals taken for this visit.    Allergies  Allergen Reactions  . Atorvastatin Other (See Comments)    HA   . Glipizide Other (See Comments)    HA  . Losartan Other (See Comments)    Vision changes, HA.   . Quinapril Cough    Past Medical History  Diagnosis Date  . Diabetes mellitus     w/ neuropathy  . COPD (chronic obstructive pulmonary disease)   . Smoker   . Hypertension   . Chronic back pain     only mild loss of T12-L1 disk ht on xray 1-12    No past surgical history on file.  History   Social History  . Marital Status: Divorced    Spouse Name: N/A    Number of Children: N/A  . Years of Education: N/A   Occupational History  . Not on file.   Social History Main Topics  . Smoking status: Former Smoker -- 0.00 packs/day    Types: Cigarettes  . Smokeless tobacco: Not on file  . Alcohol Use: No  . Drug Use: Not on file  . Sexual Activity: Not on file   Other Topics Concern  . Not on file   Social History Narrative  . No narrative on file    Family History  Problem Relation Age of Onset  . Diabetes Mother   . Alcohol abuse Father     liver    Outpatient Encounter Prescriptions as of 02/06/2014  Medication Sig  . albuterol (PROAIR HFA) 108 (90 BASE) MCG/ACT inhaler  Inhale 2 puffs into the lungs every 6 (six) hours as needed for wheezing.  . AMBULATORY NON FORMULARY MEDICATION Medication Name: Glucometer with strips and lancet to test once a day (box of 100)  Dx 250.00 (diabetes.  Marland Kitchen. amitriptyline (ELAVIL) 50 MG tablet TAKE ONE TABLET BY MOUTH AT BEDTIME  . aspirin EC 81 MG tablet Take 1 tablet (81 mg total) by mouth daily.  Marland Kitchen. azithromycin (ZITHROMAX) 250 MG tablet 2 tabs on Day 1, then 1 a day 4 days.  . Canagliflozin (INVOKANA) 300 MG TABS Take 1 tablet (300 mg total) by mouth daily.  . Fluticasone Furoate-Vilanterol (BREO ELLIPTA) 100-25 MCG/INH AEPB Inhale 1 puff into the lungs daily.  Marland Kitchen. gabapentin (NEURONTIN) 600 MG tablet TAKE TWO TABLETS BY MOUTH TWO TIMES DAILY  . glimepiride (AMARYL) 4 MG tablet Take 1 tablet (4 mg total) by mouth daily before breakfast.  . insulin glargine (LANTUS) 100 UNIT/ML injection Inject 52 Units into the skin daily.   . Insulin Pen Needle (B-D ULTRAFINE III SHORT PEN) 31G X 8 MM MISC 1  Units by Does not apply route daily.  . Insulin Syringe-Needle U-100 30G X 1/2" 1 ML MISC As directed  . losartan (COZAAR) 50 MG tablet TAKE ONE TABLET BY MOUTH ONCE DAILY  . meloxicam (MOBIC) 15 MG tablet Take 1 tablet (15 mg total) by mouth daily.  . metFORMIN (GLUCOPHAGE) 1000 MG tablet TAKE ONE TABLET BY MOUTH TWICE DAILY WITH A MEAL.  . metoprolol (LOPRESSOR) 50 MG tablet Take 0.5 tablets (25 mg total) by mouth 2 (two) times daily.  Marland Kitchen. omeprazole (PRILOSEC) 40 MG capsule TAKE ONE CAPSULE BY MOUTH ONCE DAILY  . predniSONE (DELTASONE) 20 MG tablet Take 2 tablets (40 mg total) by mouth daily.  . pregabalin (LYRICA) 100 MG capsule Take 1 capsule (100 mg total) by mouth 3 (three) times daily.  . simvastatin (ZOCOR) 40 MG tablet Take 1 tablet (40 mg total) by mouth at bedtime.  . SYMBICORT 160-4.5 MCG/ACT inhaler INHALE TWO PUFFS BY MOUTH TWICE DAILY  . traZODone (DESYREL) 150 MG tablet TAKE ONE TABLET BY MOUTH AT BEDTIME           Objective:   Physical Exam  Constitutional: He is oriented to person, place, and time. He appears well-developed and well-nourished.  HENT:  Head: Normocephalic and atraumatic.  Right Ear: External ear normal.  Left Ear: External ear normal.  Nose: Nose normal.  Mouth/Throat: Oropharynx is clear and moist.  TMs and canals are clear.   Eyes: Conjunctivae and EOM are normal. Pupils are equal, round, and reactive to light.  Neck: Neck supple. No thyromegaly present.  Cardiovascular: Normal rate and normal heart sounds.   Pulmonary/Chest: Effort normal and breath sounds normal.  Musculoskeletal:  Very tender over the muscles of upper and lower arm.  He has fasciculationsin the upper extremity on the left side.    Lymphadenopathy:    He has no cervical adenopathy.  Neurological: He is alert and oriented to person, place, and time.  Skin: Skin is warm and dry.  Psychiatric: He has a normal mood and affect.          Assessment & Plan:  COPD exacerbation. - will tx with azithro and prednisone. Cal if not improving over the next week. Use alubterol every 6 hours PRN.   Myalgias - May be related to the statin.  Tender over the muscle. Hold statin for now.  Will check CK level.   Peripheral neuropathy. Will check for B12 def, TSH, anemia, etc. Will change to Lyrica. Keep regular f/u.

## 2014-02-06 NOTE — Patient Instructions (Addendum)
Hold you simvastatin for now. Don't throw it away but stop it. This is your cholesterol pill.

## 2014-02-07 LAB — COMPLETE METABOLIC PANEL WITH GFR
ALT: 18 U/L (ref 0–53)
AST: 18 U/L (ref 0–37)
Albumin: 5 g/dL (ref 3.5–5.2)
Alkaline Phosphatase: 61 U/L (ref 39–117)
BILIRUBIN TOTAL: 0.6 mg/dL (ref 0.2–1.2)
BUN: 12 mg/dL (ref 6–23)
CO2: 24 meq/L (ref 19–32)
Calcium: 10.2 mg/dL (ref 8.4–10.5)
Chloride: 93 mEq/L — ABNORMAL LOW (ref 96–112)
Creat: 0.77 mg/dL (ref 0.50–1.35)
GFR, Est African American: >89 mL/min
GLUCOSE: 359 mg/dL — AB (ref 70–99)
Potassium: 4.3 mEq/L (ref 3.5–5.3)
Sodium: 133 mEq/L — ABNORMAL LOW (ref 135–145)
Total Protein: 7.7 g/dL (ref 6.0–8.3)

## 2014-02-07 LAB — CK: Total CK: 103 U/L (ref 7–232)

## 2014-02-07 LAB — VITAMIN B12: Vitamin B-12: 887 pg/mL (ref 211–911)

## 2014-02-07 LAB — MAGNESIUM: MAGNESIUM: 1.8 mg/dL (ref 1.5–2.5)

## 2014-02-07 LAB — TSH: TSH: 1.727 u[IU]/mL (ref 0.350–4.500)

## 2014-02-07 LAB — FOLATE

## 2014-02-07 LAB — FERRITIN: FERRITIN: 68 ng/mL (ref 22–322)

## 2014-02-08 ENCOUNTER — Other Ambulatory Visit: Payer: Self-pay | Admitting: *Deleted

## 2014-02-08 MED ORDER — AMITRIPTYLINE HCL 50 MG PO TABS: ORAL_TABLET | ORAL | Status: DC

## 2014-02-09 ENCOUNTER — Other Ambulatory Visit: Payer: Self-pay | Admitting: *Deleted

## 2014-02-09 DIAGNOSIS — E785 Hyperlipidemia, unspecified: Secondary | ICD-10-CM

## 2014-02-09 DIAGNOSIS — Z23 Encounter for immunization: Secondary | ICD-10-CM

## 2014-02-09 DIAGNOSIS — I10 Essential (primary) hypertension: Secondary | ICD-10-CM

## 2014-02-09 DIAGNOSIS — E1142 Type 2 diabetes mellitus with diabetic polyneuropathy: Secondary | ICD-10-CM

## 2014-02-09 MED ORDER — GABAPENTIN 600 MG PO TABS: ORAL_TABLET | ORAL | Status: DC

## 2014-02-10 ENCOUNTER — Other Ambulatory Visit: Payer: Self-pay | Admitting: Family Medicine

## 2014-02-21 ENCOUNTER — Other Ambulatory Visit: Payer: Self-pay | Admitting: Family Medicine

## 2014-03-01 ENCOUNTER — Other Ambulatory Visit: Payer: Self-pay | Admitting: Family Medicine

## 2014-03-17 ENCOUNTER — Telehealth: Payer: Self-pay

## 2014-03-17 NOTE — Telephone Encounter (Signed)
Justin Goodman is wanting a letter mailed to his home stating he needs a first floor apartment. I mailed letter to patient.

## 2014-04-05 ENCOUNTER — Other Ambulatory Visit: Payer: Self-pay | Admitting: Family Medicine

## 2014-04-11 ENCOUNTER — Encounter: Payer: Self-pay | Admitting: Family Medicine

## 2014-04-11 ENCOUNTER — Ambulatory Visit (INDEPENDENT_AMBULATORY_CARE_PROVIDER_SITE_OTHER): Payer: Commercial Managed Care - HMO | Admitting: Family Medicine

## 2014-04-11 VITALS — BP 119/58 | HR 89 | Ht 69.0 in | Wt 181.0 lb

## 2014-04-11 DIAGNOSIS — G629 Polyneuropathy, unspecified: Secondary | ICD-10-CM

## 2014-04-11 DIAGNOSIS — H6123 Impacted cerumen, bilateral: Secondary | ICD-10-CM

## 2014-04-11 DIAGNOSIS — E785 Hyperlipidemia, unspecified: Secondary | ICD-10-CM

## 2014-04-11 DIAGNOSIS — E1142 Type 2 diabetes mellitus with diabetic polyneuropathy: Secondary | ICD-10-CM

## 2014-04-11 DIAGNOSIS — I10 Essential (primary) hypertension: Secondary | ICD-10-CM

## 2014-04-11 DIAGNOSIS — Z23 Encounter for immunization: Secondary | ICD-10-CM

## 2014-04-11 DIAGNOSIS — E1140 Type 2 diabetes mellitus with diabetic neuropathy, unspecified: Secondary | ICD-10-CM

## 2014-04-11 DIAGNOSIS — E1342 Other specified diabetes mellitus with diabetic polyneuropathy: Secondary | ICD-10-CM

## 2014-04-11 LAB — POCT GLYCOSYLATED HEMOGLOBIN (HGB A1C): HEMOGLOBIN A1C: 8.9

## 2014-04-11 MED ORDER — INSULIN GLARGINE 300 UNIT/ML ~~LOC~~ SOPN: 54.0000 [IU] | PEN_INJECTOR | Freq: Every day | SUBCUTANEOUS | Status: DC

## 2014-04-11 MED ORDER — AMITRIPTYLINE HCL 50 MG PO TABS: ORAL_TABLET | ORAL | Status: DC

## 2014-04-11 MED ORDER — ALBUTEROL SULFATE HFA 108 (90 BASE) MCG/ACT IN AERS
2.0000 | INHALATION_SPRAY | Freq: Four times a day (QID) | RESPIRATORY_TRACT | Status: DC | PRN
Start: 2014-04-11 — End: 2015-04-11

## 2014-04-11 MED ORDER — GABAPENTIN 600 MG PO TABS: ORAL_TABLET | ORAL | Status: DC

## 2014-04-11 MED ORDER — DULOXETINE HCL 30 MG PO CPEP
ORAL_CAPSULE | ORAL | Status: DC
Start: 2014-04-11 — End: 2014-05-12

## 2014-04-11 NOTE — Patient Instructions (Addendum)
Increase your Lantus to 54 units and move to bedtime Then when run out change to the Toujeo.   Decrease gabapentin to 1 tab three times a day.

## 2014-04-11 NOTE — Progress Notes (Signed)
   Subjective:    Patient ID: Justin ChimesMichael Goodman, male    DOB: 01/21/1956, 59 y.o.   MRN: [id number]  HPI Diabetes - no hypoglycemic events. No wounds or sores that are not healing well. No increased thirst or urination. Checking glucose at home. Taking medications as prescribed without any side effects. He has not been going to the gym. He feels like his pain in his legs from his neuropathy is getting worse and it is affecting his ability to exercise.  Hypertension- Pt denies chest pain, SOB, dizziness, or heart palpitations.  Taking meds as directed w/o problems.  Denies medication side effects.    Diabetic peripheral neuropathy-he feels that the gabapentin and the meloxicam are not helping to control his pain in his feet. He would like to try different medication if at all possible.  Patient would like his ears checked today. He thinks they are full of wax again and might need to be irrigated. No pain or fever.  Review of Systems     Objective:   Physical Exam  Constitutional: He is oriented to person, place, and time. He appears well-developed and well-nourished.  HENT:  Head: Normocephalic and atraumatic.  bilat cerumen impaction  Cardiovascular: Normal rate, regular rhythm and normal heart sounds.   Pulmonary/Chest: Effort normal and breath sounds normal.  Neurological: He is alert and oriented to person, place, and time.  Skin: Skin is warm and dry.  Psychiatric: He has a normal mood and affect. His behavior is normal.          Assessment & Plan:  DM- doing well overall. He's not having any hypoglycemic events but he is not well controlled. A1c is 8.9 today which is up from 6.5 previously. We will increase his Lantus to 54 units. When he runs out of his current medication we will switch him to [employer]. This may be more cost effective since we are increasing his dose. If he does not notice a decrease in his blood sugars so that his fastings are under 130 in the next couple weeks  he's to give me a call back so that we can make an adjustment over the phone. If he is feeling better than encourage him to get back on track with exercise.  HTN- well controlled on current regimen. Next  Peripheral neuropathy, diabetic-discussed the importance of his blood sugars back under control. We will try Cymbalta. Start with 30 mg daily for 2 weeks and then increase to 60 mg. If this works well then we can consider weaning the gabapentin. Can go ahead and decrease to 1 tab 3 times a day. Can gradually wean if he's doing well on the Cymbalta.  Bilateral Cerumen impaction-irrigation performed of both ears. Patient tolerated well.

## 2014-04-12 DIAGNOSIS — E119 Type 2 diabetes mellitus without complications: Secondary | ICD-10-CM | POA: Diagnosis not present

## 2014-04-12 DIAGNOSIS — H25813 Combined forms of age-related cataract, bilateral: Secondary | ICD-10-CM | POA: Diagnosis not present

## 2014-04-12 DIAGNOSIS — H527 Unspecified disorder of refraction: Secondary | ICD-10-CM | POA: Diagnosis not present

## 2014-04-12 DIAGNOSIS — H52223 Regular astigmatism, bilateral: Secondary | ICD-10-CM | POA: Diagnosis not present

## 2014-04-12 DIAGNOSIS — H02831 Dermatochalasis of right upper eyelid: Secondary | ICD-10-CM | POA: Diagnosis not present

## 2014-04-12 DIAGNOSIS — H02834 Dermatochalasis of left upper eyelid: Secondary | ICD-10-CM | POA: Diagnosis not present

## 2014-04-13 ENCOUNTER — Other Ambulatory Visit: Payer: Self-pay | Admitting: Family Medicine

## 2014-04-14 ENCOUNTER — Other Ambulatory Visit: Payer: Self-pay | Admitting: Family Medicine

## 2014-04-18 ENCOUNTER — Telehealth: Payer: Self-pay | Admitting: *Deleted

## 2014-04-18 MED ORDER — AMOXICILLIN-POT CLAVULANATE 875-125 MG PO TABS: 1.0000 | ORAL_TABLET | Freq: Two times a day (BID) | ORAL | Status: DC

## 2014-04-18 NOTE — Telephone Encounter (Signed)
Pres[facility] if he is not feeling much better in one week and he needs an office visit for exam..

## 2014-04-18 NOTE — Telephone Encounter (Signed)
Pt informed that rx has been sent advised to try OTC cough med. And if not better to come in for OV.Laureen Ochs.Diasia Henken, Viann Shoveonya [name]

## 2014-04-18 NOTE — Telephone Encounter (Signed)
Returned pt's call he has had cough x 5 days, it is productive dark green mucus. He has not taken any OTC meds. Denies fever,chills. He is asking if an abx can be sent to his pharmacy. Fwd to pcp for advise.Heath GoldBarkley, Dorsie Sethi [name]'

## 2014-04-18 NOTE — Telephone Encounter (Signed)
Pt called and lvm indicating that he has a cough and he is scheduled to have eye surgery next Tuesday and won't be able to have surgery done. Laureen Ochs.Mindie Rawdon, Viann Shoveonya [name]

## 2014-04-24 ENCOUNTER — Telehealth: Payer: Self-pay | Admitting: *Deleted

## 2014-04-24 NOTE — Telephone Encounter (Signed)
Pt called and stated that his bs are over 300 and are not going down so he's taking in the morning instead now.  Also he will need for [facility].Loralee PacasBarkley, Dalaney Needle [place]etta

## 2014-04-25 DIAGNOSIS — H25811 Combined forms of age-related cataract, right eye: Secondary | ICD-10-CM | POA: Diagnosis not present

## 2014-04-25 DIAGNOSIS — H2511 Age-related nuclear cataract, right eye: Secondary | ICD-10-CM | POA: Diagnosis not present

## 2014-04-26 DIAGNOSIS — H02831 Dermatochalasis of right upper eyelid: Secondary | ICD-10-CM | POA: Diagnosis not present

## 2014-04-26 DIAGNOSIS — H25812 Combined forms of age-related cataract, left eye: Secondary | ICD-10-CM | POA: Diagnosis not present

## 2014-04-26 DIAGNOSIS — H52223 Regular astigmatism, bilateral: Secondary | ICD-10-CM | POA: Diagnosis not present

## 2014-04-26 DIAGNOSIS — H527 Unspecified disorder of refraction: Secondary | ICD-10-CM | POA: Diagnosis not present

## 2014-04-26 DIAGNOSIS — E119 Type 2 diabetes mellitus without complications: Secondary | ICD-10-CM | POA: Diagnosis not present

## 2014-04-26 DIAGNOSIS — Z961 Presence of intraocular lens: Secondary | ICD-10-CM | POA: Diagnosis not present

## 2014-04-26 DIAGNOSIS — H02834 Dermatochalasis of left upper eyelid: Secondary | ICD-10-CM | POA: Diagnosis not present

## 2014-05-08 ENCOUNTER — Other Ambulatory Visit: Payer: Self-pay | Admitting: *Deleted

## 2014-05-08 DIAGNOSIS — G8929 Other chronic pain: Secondary | ICD-10-CM

## 2014-05-08 DIAGNOSIS — E785 Hyperlipidemia, unspecified: Secondary | ICD-10-CM

## 2014-05-08 DIAGNOSIS — I10 Essential (primary) hypertension: Secondary | ICD-10-CM

## 2014-05-08 DIAGNOSIS — E1142 Type 2 diabetes mellitus with diabetic polyneuropathy: Secondary | ICD-10-CM

## 2014-05-08 DIAGNOSIS — Z23 Encounter for immunization: Secondary | ICD-10-CM

## 2014-05-08 MED ORDER — METOPROLOL TARTRATE 50 MG PO TABS: 25.0000 mg | ORAL_TABLET | Freq: Two times a day (BID) | ORAL | Status: DC

## 2014-05-08 MED ORDER — MELOXICAM 15 MG PO TABS: 15.0000 mg | ORAL_TABLET | Freq: Every day | ORAL | Status: DC

## 2014-05-08 MED ORDER — AMITRIPTYLINE HCL 50 MG PO TABS: ORAL_TABLET | ORAL | Status: DC

## 2014-05-08 MED ORDER — GABAPENTIN 600 MG PO TABS: ORAL_TABLET | ORAL | Status: DC

## 2014-05-09 ENCOUNTER — Other Ambulatory Visit: Payer: Self-pay

## 2014-05-12 ENCOUNTER — Other Ambulatory Visit: Payer: Self-pay | Admitting: *Deleted

## 2014-05-12 MED ORDER — DULOXETINE HCL 30 MG PO CPEP: ORAL_CAPSULE | ORAL | Status: DC

## 2014-05-15 ENCOUNTER — Other Ambulatory Visit: Payer: Self-pay | Admitting: Family Medicine

## 2014-05-23 ENCOUNTER — Other Ambulatory Visit: Payer: Self-pay | Admitting: Family Medicine

## 2014-05-23 MED ORDER — GLUCOSE BLOOD VI STRP: ORAL_STRIP | Status: DC

## 2014-05-23 MED ORDER — ACCU-CHEK NANO SMARTVIEW W/DEVICE KIT: PACK | Status: DC

## 2014-05-23 MED ORDER — ACCU-CHEK SMARTVIEW CONTROL VI LIQD: Status: DC

## 2014-05-23 MED ORDER — BD SWAB SINGLE USE REGULAR PADS
MEDICATED_PAD | Status: DC
Start: 2014-05-23 — End: 2018-06-15

## 2014-05-23 MED ORDER — ACCU-CHEK FASTCLIX LANCETS MISC: Status: DC

## 2014-05-25 DIAGNOSIS — Z87891 Personal history of nicotine dependence: Secondary | ICD-10-CM | POA: Diagnosis not present

## 2014-05-25 DIAGNOSIS — E785 Hyperlipidemia, unspecified: Secondary | ICD-10-CM | POA: Diagnosis not present

## 2014-05-25 DIAGNOSIS — G629 Polyneuropathy, unspecified: Secondary | ICD-10-CM | POA: Diagnosis not present

## 2014-05-25 DIAGNOSIS — E119 Type 2 diabetes mellitus without complications: Secondary | ICD-10-CM | POA: Diagnosis not present

## 2014-05-25 DIAGNOSIS — H527 Unspecified disorder of refraction: Secondary | ICD-10-CM | POA: Diagnosis not present

## 2014-05-25 DIAGNOSIS — H2181 Floppy iris syndrome: Secondary | ICD-10-CM | POA: Diagnosis not present

## 2014-05-25 DIAGNOSIS — H52223 Regular astigmatism, bilateral: Secondary | ICD-10-CM | POA: Diagnosis not present

## 2014-05-25 DIAGNOSIS — I10 Essential (primary) hypertension: Secondary | ICD-10-CM | POA: Diagnosis not present

## 2014-05-25 DIAGNOSIS — H25812 Combined forms of age-related cataract, left eye: Secondary | ICD-10-CM | POA: Insufficient documentation

## 2014-05-26 ENCOUNTER — Other Ambulatory Visit: Payer: Self-pay | Admitting: Family Medicine

## 2014-05-27 MED ORDER — GLIMEPIRIDE 4 MG PO TABS: 4.0000 mg | ORAL_TABLET | Freq: Every day | ORAL | Status: DC

## 2014-06-07 ENCOUNTER — Other Ambulatory Visit: Payer: Self-pay

## 2014-06-07 MED ORDER — SIMVASTATIN 40 MG PO TABS: 40.0000 mg | ORAL_TABLET | Freq: Every day | ORAL | Status: DC

## 2014-06-09 ENCOUNTER — Telehealth: Payer: Self-pay

## 2014-06-09 DIAGNOSIS — G609 Hereditary and idiopathic neuropathy, unspecified: Secondary | ICD-10-CM

## 2014-06-09 NOTE — Telephone Encounter (Signed)
Let's refer him to neurology for further evaluation and treatment options for his foot pain.

## 2014-06-09 NOTE — Telephone Encounter (Signed)
Patient advised.

## 2014-06-09 NOTE — Telephone Encounter (Signed)
Justin Goodman is taking gabapentin 600 mg tid and Cymbalta 30 mg once daily. He did not increase to the 60 mg because he did not notice any improvement. He reports not being able to work out due to his bilateral foot pain.

## 2014-06-19 ENCOUNTER — Other Ambulatory Visit: Payer: Self-pay | Admitting: Family Medicine

## 2014-07-04 ENCOUNTER — Other Ambulatory Visit: Payer: Self-pay | Admitting: Family Medicine

## 2014-07-04 MED ORDER — INSULIN PEN NEEDLE 31G X 8 MM MISC: 1.0000 [IU] | Freq: Every day | Status: DC

## 2014-07-05 ENCOUNTER — Other Ambulatory Visit: Payer: Self-pay | Admitting: Family Medicine

## 2014-07-05 ENCOUNTER — Telehealth: Payer: Self-pay | Admitting: *Deleted

## 2014-07-05 NOTE — Telephone Encounter (Signed)
Pt called and said that the meloxicam is too expensive and said that his ins co faxed over a list of what is covered. I told him that I did not receive anything from his pharmacy or ins co regarding this. He told me that he is hurting all over. I asked him if he was running a fever, experiencing any chills, sweats, swelling, n/v. he said that he was not. I asked if he was staying hydrated he is. Advised him to come in to be seen if his condition gets worse. Pt voiced understanding and agreed..will fwd to pcp for med change.Laureen Ochs.Airyonna Franklyn, Viann Shoveonya [name]

## 2014-07-05 NOTE — Telephone Encounter (Signed)
lvm for rtn call .Justin Goodman [name]  

## 2014-07-06 ENCOUNTER — Other Ambulatory Visit: Payer: Self-pay | Admitting: Family Medicine

## 2014-07-06 MED ORDER — TRAZODONE HCL 150 MG PO TABS: 150.0000 mg | ORAL_TABLET | Freq: Every day | ORAL | Status: DC

## 2014-07-10 ENCOUNTER — Ambulatory Visit: Payer: Commercial Managed Care - HMO | Admitting: Neurology

## 2014-07-11 ENCOUNTER — Ambulatory Visit: Payer: Commercial Managed Care - HMO | Admitting: Family Medicine

## 2014-07-11 ENCOUNTER — Telehealth: Payer: Self-pay | Admitting: Neurology

## 2014-07-11 NOTE — Telephone Encounter (Signed)
Pt no showed the new patient appt on 07-10-14 and notified the Dr Linford ArnoldMetheney office by epic

## 2014-07-12 ENCOUNTER — Encounter: Payer: Self-pay | Admitting: Family Medicine

## 2014-07-12 ENCOUNTER — Ambulatory Visit (INDEPENDENT_AMBULATORY_CARE_PROVIDER_SITE_OTHER): Payer: Commercial Managed Care - HMO | Admitting: Family Medicine

## 2014-07-12 VITALS — BP 152/63 | HR 86 | Ht 69.0 in | Wt 186.0 lb

## 2014-07-12 DIAGNOSIS — K5901 Slow transit constipation: Secondary | ICD-10-CM

## 2014-07-12 DIAGNOSIS — E118 Type 2 diabetes mellitus with unspecified complications: Secondary | ICD-10-CM

## 2014-07-12 DIAGNOSIS — M25511 Pain in right shoulder: Secondary | ICD-10-CM

## 2014-07-12 DIAGNOSIS — G609 Hereditary and idiopathic neuropathy, unspecified: Secondary | ICD-10-CM

## 2014-07-12 LAB — POCT GLYCOSYLATED HEMOGLOBIN (HGB A1C): Hemoglobin A1C: 7.8

## 2014-07-12 MED ORDER — INSULIN GLARGINE 300 UNIT/ML ~~LOC~~ SOPN: 60.0000 [IU] | PEN_INJECTOR | Freq: Every day | SUBCUTANEOUS | Status: DC

## 2014-07-12 MED ORDER — LUBIPROSTONE 24 MCG PO CAPS: 24.0000 ug | ORAL_CAPSULE | Freq: Two times a day (BID) | ORAL | Status: DC

## 2014-07-12 NOTE — Progress Notes (Signed)
   Subjective:    Patient ID: Justin Goodman, male    DOB: [birth year], 59 y.o.   MRN: [id number]  HPI Diabetes - no hypoglycemic events. No wounds or sores that are not healing well. No increased thirst or urination. Checking glucose at home. Taking medications as prescribed without any side effects. Has beenon 60 units for 3 months.   Has been unable to exercise bc of the leg pain.  We are working on neurology referral. Stopped the cymbalta bc it made him feel ike he didn't want to do anything.   Constipation - Using miralax once a week.   That does seem to help he really would prefer to take something daily but doesn't cause a lot of cramping. He wants and if there's a pill he can take.  Right shoulder pain -  His right sure has been othering him for abut the last 3 months. He denies any trauma injury or injury. He has not been working out that's really the only difference in his routine. He says it hurts over the top of his shoulder and he services has difficulty reaching behind his back.  Review of Systems     Objective:   Physical Exam  Constitutional: He is oriented to person, place, and time. He appears well-developed and well-nourished.  HENT:  Head: Normocephalic and atraumatic.  Cardiovascular: Normal rate, regular rhythm and normal heart sounds.   Pulmonary/Chest: Effort normal and breath sounds normal.  Musculoskeletal:  Right shoulder with extension to about 145 before he started to experience pain. Unable to completely abduct 180. Unable to reach his low back with either arm. These maneuvers cause pain. Slightly weaker on the right with empty can test but no significant drop against resistance. Pain with external rotation and with Hawkins test.  Neurological: He is alert and oriented to person, place, and time.  Skin: Skin is warm and dry.  Psychiatric: He has a normal mood and affect. His behavior is normal.          Assessment & Plan:  DM-  Uncontrolled. Normally  his A1c looks better but he has not been able to exercise because of the neuropathy pain. Inc tojeo to 65 units.    Peripheral neuropathy-we'll place a referral for neurology. The original referral that we have placed that particular neurologist only specializes in headaches and migraines. [place] but he was unable to find the location and so missed his appointment.  Constipation- discuss treatment option with Amitiza or Linzess. Of Amitiza is covered on his insurance we will start with this one.  Right shoulder pain - likely impingement based on exam or early rotator cuff syndrome.  Discussed xray, exercise and maybe PT referral. He wants to floow up with neurology before addresses his shoulder.

## 2014-07-12 NOTE — Patient Instructions (Signed)
Increase Toujeo to 65 units at bedtime.

## 2014-07-20 DIAGNOSIS — G629 Polyneuropathy, unspecified: Secondary | ICD-10-CM | POA: Diagnosis not present

## 2014-07-25 ENCOUNTER — Other Ambulatory Visit: Payer: Self-pay | Admitting: *Deleted

## 2014-07-25 DIAGNOSIS — E1142 Type 2 diabetes mellitus with diabetic polyneuropathy: Secondary | ICD-10-CM

## 2014-07-25 MED ORDER — GABAPENTIN 600 MG PO TABS: 1200.0000 mg | ORAL_TABLET | Freq: Three times a day (TID) | ORAL | Status: DC

## 2014-07-27 ENCOUNTER — Other Ambulatory Visit: Payer: Self-pay | Admitting: Family Medicine

## 2014-07-28 ENCOUNTER — Other Ambulatory Visit: Payer: Self-pay | Admitting: Family Medicine

## 2014-07-28 ENCOUNTER — Other Ambulatory Visit: Payer: Self-pay | Admitting: *Deleted

## 2014-07-28 DIAGNOSIS — G8929 Other chronic pain: Secondary | ICD-10-CM

## 2014-08-25 ENCOUNTER — Other Ambulatory Visit: Payer: Self-pay | Admitting: Family Medicine

## 2014-08-30 ENCOUNTER — Other Ambulatory Visit: Payer: Self-pay | Admitting: *Deleted

## 2014-08-30 MED ORDER — OMEPRAZOLE 40 MG PO CPDR: 40.0000 mg | DELAYED_RELEASE_CAPSULE | Freq: Every day | ORAL | Status: DC

## 2014-08-31 ENCOUNTER — Other Ambulatory Visit: Payer: Self-pay | Admitting: Family Medicine

## 2014-08-31 MED ORDER — LOSARTAN POTASSIUM 50 MG PO TABS: 50.0000 mg | ORAL_TABLET | Freq: Every day | ORAL | Status: DC

## 2014-09-01 ENCOUNTER — Other Ambulatory Visit: Payer: Self-pay | Admitting: Family Medicine

## 2014-09-01 ENCOUNTER — Ambulatory Visit (INDEPENDENT_AMBULATORY_CARE_PROVIDER_SITE_OTHER): Payer: Commercial Managed Care - HMO

## 2014-09-01 DIAGNOSIS — R05 Cough: Secondary | ICD-10-CM

## 2014-09-01 DIAGNOSIS — M25512 Pain in left shoulder: Secondary | ICD-10-CM | POA: Diagnosis not present

## 2014-09-01 DIAGNOSIS — M19011 Primary osteoarthritis, right shoulder: Secondary | ICD-10-CM | POA: Diagnosis not present

## 2014-09-01 DIAGNOSIS — R059 Cough, unspecified: Secondary | ICD-10-CM

## 2014-09-05 ENCOUNTER — Other Ambulatory Visit: Payer: Self-pay | Admitting: Family Medicine

## 2014-09-05 DIAGNOSIS — M25511 Pain in right shoulder: Secondary | ICD-10-CM

## 2014-09-06 ENCOUNTER — Telehealth: Payer: Self-pay | Admitting: *Deleted

## 2014-09-06 NOTE — Telephone Encounter (Signed)
Pt called and lvm indicating that the Toujeo is to expensive and will need an alternative med to be sent to his pharmacy. Will forward to pcp for advice.Justin Goodman, Justin Esper ArthurLynetta

## 2014-09-07 MED ORDER — INSULIN DETEMIR 100 UNIT/ML FLEXPEN: 60.0000 [IU] | PEN_INJECTOR | Freq: Every day | SUBCUTANEOUS | Status: DC

## 2014-09-07 NOTE — Telephone Encounter (Signed)
Called pt and lvm informing him that he is now met the medicare gap and that he will need to call his insurance co to see if they will cover lantus or levimer or he can apply for pt assistance which will give him the medication for free for the remainder of the year if he gets approved. Asked him to call back with medication preference.Audelia Hives [place]

## 2014-09-07 NOTE — Telephone Encounter (Signed)
Called pt's ins co and he can get either the lantus 100 unit vial $50.77 for 30 days or Levimer flexpen $45.42 for 30 days.Loralee PacasBarkley, Swetha Rayle [place]Lynetta

## 2014-09-07 NOTE — Telephone Encounter (Signed)
Prescription sent to the pharmacy for Levemir. Also please let patient know that we got a notification from the pharmacy about the simvastatin possibly causing some side effects. He can stop taking the simvastatin for a few weeks and see if he feels better off of it. If he does then let me know and we can add it to his intolerance list and try something else. If he doesn't notice much of a difference after 3-4 weeks then he can restart the medication.

## 2014-09-07 NOTE — Telephone Encounter (Signed)
Called pharmacy, Pt is at his donut hole.

## 2014-09-07 NOTE — Telephone Encounter (Signed)
Pt given recommendations voiced understanding and agreed.Marland Kitchen.Marland Kitchen.Marland Kitchen.Loralee PacasBarkley, Delynda Sepulveda CookLynetta

## 2014-09-07 NOTE — Telephone Encounter (Signed)
Did he hit his medicair gap. WE can give him samples for now if needed.

## 2014-09-11 ENCOUNTER — Encounter: Payer: Self-pay | Admitting: Sports Medicine

## 2014-09-11 ENCOUNTER — Ambulatory Visit (INDEPENDENT_AMBULATORY_CARE_PROVIDER_SITE_OTHER): Payer: Commercial Managed Care - HMO

## 2014-09-11 ENCOUNTER — Ambulatory Visit (INDEPENDENT_AMBULATORY_CARE_PROVIDER_SITE_OTHER): Payer: Commercial Managed Care - HMO | Admitting: Sports Medicine

## 2014-09-11 VITALS — BP 124/68 | HR 95 | Ht 69.0 in | Wt 182.0 lb

## 2014-09-11 DIAGNOSIS — M25511 Pain in right shoulder: Secondary | ICD-10-CM

## 2014-09-11 DIAGNOSIS — M75101 Unspecified rotator cuff tear or rupture of right shoulder, not specified as traumatic: Secondary | ICD-10-CM | POA: Diagnosis not present

## 2014-09-11 DIAGNOSIS — M67811 Other specified disorders of synovium, right shoulder: Secondary | ICD-10-CM | POA: Diagnosis not present

## 2014-09-11 NOTE — Assessment & Plan Note (Signed)
MR arthrogram injection of the right shoulder. Further management per Dr. Linford ArnoldMetheney, ordering physician of the MRI.

## 2014-09-11 NOTE — Progress Notes (Signed)
  Procedure: Real-time Ultrasound Guided gadolinium contrast injection of right glenohumeral joint  Device: GE Logiq E  Verbal informed consent obtained.  Time-out conducted.  Noted no overlying erythema, induration, or other signs of local infection.  Skin prepped in a sterile fashion.  Local anesthesia: Topical Ethyl chloride.  With sterile technique and under real time ultrasound guidance:  Spinal needle advanced into the glenohumeral joint from a posterior approach, 1 mL kenalog 40, 4 mL lidocaine injected easily, syringe switched and 0.1 mL of dilute gadolinium injected, syringe again switched and 10 mL of sterile saline was used to flush the needle. Joint visualized and capsule seen distending confirming intra-articular placement of contrast material and medication. Completed without difficulty  Advised to call if fevers/chills, erythema, induration, drainage, or persistent bleeding.  Images permanently stored and available for review in the ultrasound unit.  Impression: Technically successful ultrasound guided gadolinium contrast injection for MR arthrography.  Please see separate MR arthrogram report.

## 2014-09-29 ENCOUNTER — Other Ambulatory Visit: Payer: Self-pay | Admitting: Family Medicine

## 2014-10-02 ENCOUNTER — Other Ambulatory Visit: Payer: Self-pay | Admitting: *Deleted

## 2014-10-02 ENCOUNTER — Telehealth: Payer: Self-pay | Admitting: *Deleted

## 2014-10-02 DIAGNOSIS — E1142 Type 2 diabetes mellitus with diabetic polyneuropathy: Secondary | ICD-10-CM

## 2014-10-02 MED ORDER — INSULIN GLARGINE 300 UNIT/ML ~~LOC~~ SOPN: 60.0000 [IU] | PEN_INJECTOR | Freq: Every day | SUBCUTANEOUS | Status: DC

## 2014-10-02 MED ORDER — INSULIN GLARGINE 100 UNIT/ML ~~LOC~~ SOLN: 52.0000 [IU] | Freq: Every day | SUBCUTANEOUS | Status: DC

## 2014-10-02 NOTE — Telephone Encounter (Signed)
Pt called and stated that he could not afford toujeo it is going to cost $435. I informed him that I would put his name on some samples and provide him with a savings card. Laureen Ochs, Viann Shove

## 2014-10-04 ENCOUNTER — Other Ambulatory Visit: Payer: Self-pay | Admitting: *Deleted

## 2014-10-04 ENCOUNTER — Other Ambulatory Visit: Payer: Self-pay | Admitting: Family Medicine

## 2014-10-11 ENCOUNTER — Ambulatory Visit: Payer: Commercial Managed Care - HMO | Admitting: Family Medicine

## 2014-10-27 ENCOUNTER — Encounter: Payer: Self-pay | Admitting: Family Medicine

## 2014-10-27 ENCOUNTER — Ambulatory Visit (INDEPENDENT_AMBULATORY_CARE_PROVIDER_SITE_OTHER): Payer: Commercial Managed Care - HMO | Admitting: Family Medicine

## 2014-10-27 VITALS — BP 118/74 | HR 97 | Wt 183.0 lb

## 2014-10-27 DIAGNOSIS — E1142 Type 2 diabetes mellitus with diabetic polyneuropathy: Secondary | ICD-10-CM

## 2014-10-27 DIAGNOSIS — E1140 Type 2 diabetes mellitus with diabetic neuropathy, unspecified: Secondary | ICD-10-CM

## 2014-10-27 DIAGNOSIS — K5900 Constipation, unspecified: Secondary | ICD-10-CM

## 2014-10-27 DIAGNOSIS — E1165 Type 2 diabetes mellitus with hyperglycemia: Secondary | ICD-10-CM | POA: Diagnosis not present

## 2014-10-27 DIAGNOSIS — T887XXA Unspecified adverse effect of drug or medicament, initial encounter: Secondary | ICD-10-CM | POA: Diagnosis not present

## 2014-10-27 DIAGNOSIS — K5909 Other constipation: Secondary | ICD-10-CM | POA: Insufficient documentation

## 2014-10-27 DIAGNOSIS — IMO0002 Reserved for concepts with insufficient information to code with codable children: Secondary | ICD-10-CM

## 2014-10-27 DIAGNOSIS — T50905A Adverse effect of unspecified drugs, medicaments and biological substances, initial encounter: Secondary | ICD-10-CM

## 2014-10-27 LAB — POCT GLYCOSYLATED HEMOGLOBIN (HGB A1C): Hemoglobin A1C: 7.8

## 2014-10-27 MED ORDER — LINACLOTIDE 145 MCG PO CAPS: 145.0000 ug | ORAL_CAPSULE | Freq: Every day | ORAL | Status: DC

## 2014-10-27 NOTE — Patient Instructions (Signed)
Increase Lantus to 58 units

## 2014-10-27 NOTE — Progress Notes (Signed)
Subjective:    Patient ID: Justin Goodman, male    DOB: 09-Apr-1955, 59 y.o.   MRN: [id number]  HPI Diabetes - no hypoglycemic events. No wounds or sores that are not healing well. No increased thirst or urination. Checking glucose at home. Taking medications as prescribed without any side effects.  Chronic constipation amitiza is $149  Per month. He wants to know if there is a generic or another alternative.  Medication side effects-he had complained of several office visits that he was having a lot of shoulder and upper body pain and it was starting to affect his ability to go to the gym and exercise. When he called on June 1 I told him to stop taking his simvastatin and see if he started to feel better off of it as he had had myalgias with Lipitor in the past. He also saw Dr. Dianah Field for his right shoulder pain. He says coming off the Lipitor has made a world of difference. His pain is much less, he has more mobility in his shoulders, he is back to the gym, and he actually feels like he has more energy.  Review of Systems  BP 118/74 mmHg  Pulse 97  Wt 183 lb (83.008 kg)    Allergies  Allergen Reactions  . Atorvastatin Other (See Comments)    HA   . Glipizide Other (See Comments)    HA  . Losartan Other (See Comments)    Vision changes, HA.   Marland Kitchen Lyrica [Pregabalin] Other (See Comments)    Sedation  . Quinapril Cough  . Simvastatin Other (See Comments)    Severe myalgias.      Past Medical History  Diagnosis Date  . Diabetes mellitus     w/ neuropathy  . COPD (chronic obstructive pulmonary disease)   . Smoker   . Hypertension   . Chronic back pain     only mild loss of T12-L1 disk ht on xray 1-12    No past surgical history on file.  History   Social History  . Marital Status: Divorced    Spouse Name: N/A  . Number of Children: N/A  . Years of Education: N/A   Occupational History  . Not on file.   Social History Main Topics  . Smoking status: Former  Smoker -- 0.00 packs/day    Types: Cigarettes  . Smokeless tobacco: Not on file  . Alcohol Use: No  . Drug Use: Not on file  . Sexual Activity: Not on file   Other Topics Concern  . Not on file   Social History Narrative    Family History  Problem Relation Age of Onset  . Diabetes Mother   . Alcohol abuse Father     liver    Outpatient Encounter Prescriptions as of 10/27/2014  Medication Sig  . ACCU-CHEK FASTCLIX LANCETS MISC Check blood glucose 4 times daily Diagnosis diabetes E11.40  . albuterol (PROAIR HFA) 108 (90 BASE) MCG/ACT inhaler Inhale 2 puffs into the lungs every 6 (six) hours as needed for wheezing.  . Alcohol Swabs (B-D SINGLE USE SWABS REGULAR) PADS Check blood glucose 4 times daily Diagnosis diabetes E11.40  . AMBULATORY NON FORMULARY MEDICATION Medication Name: Glucometer with strips and lancet to test once a day (box of 100)  Dx 250.00 (diabetes.  Marland Kitchen amitriptyline (ELAVIL) 50 MG tablet TAKE ONE TABLET BY MOUTH AT BEDTIME  . aspirin EC 81 MG tablet Take 1 tablet (81 mg total) by mouth daily.  . Blood Glucose  Calibration (ACCU-CHEK SMARTVIEW CONTROL) LIQD Check blood glucose 4 times daily Diagnosis diabetes E11.40  . Blood Glucose Monitoring Suppl (ACCU-CHEK NANO SMARTVIEW) W/DEVICE KIT Check blood glucose 4 times daily Diagnosis diabetes E11.40  . Fluticasone Furoate-Vilanterol (BREO ELLIPTA) 100-25 MCG/INH AEPB Inhale 1 puff into the lungs daily.  Marland Kitchen gabapentin (NEURONTIN) 600 MG tablet Take 2 tablets (1,200 mg total) by mouth 3 (three) times daily.  Marland Kitchen glimepiride (AMARYL) 4 MG tablet Take 1 tablet (4 mg total) by mouth daily with breakfast.  . glucose blood (ACCU-CHEK SMARTVIEW) test strip Check blood glucose 4 times daily Diagnosis diabetes E11.40  . insulin glargine (LANTUS) 100 UNIT/ML injection Inject 0.52 mLs (52 Units total) into the skin daily.  . Insulin Pen Needle (B-D ULTRAFINE III SHORT PEN) 31G X 8 MM MISC 1 Units by Does not apply route daily.  .  Insulin Syringe-Needle U-100 30G X 1/2" 1 ML MISC As directed  . losartan (COZAAR) 50 MG tablet Take 1 tablet (50 mg total) by mouth daily.  . meloxicam (MOBIC) 15 MG tablet Take 1 tablet (15 mg total) by mouth daily.  . metFORMIN (GLUCOPHAGE) 1000 MG tablet TAKE ONE TABLET BY MOUTH TWICE DAILY WITH MEALS  . metoprolol (LOPRESSOR) 50 MG tablet Take 0.5 tablets (25 mg total) by mouth 2 (two) times daily.  Marland Kitchen omeprazole (PRILOSEC) 40 MG capsule Take 1 capsule (40 mg total) by mouth daily.  . SYMBICORT 160-4.5 MCG/ACT inhaler INHALE TWO PUFFS BY MOUTH TWICE DAILY  . traZODone (DESYREL) 150 MG tablet TAKE ONE TABLET BY MOUTH AT BEDTIME  . [DISCONTINUED] Insulin Detemir (LEVEMIR FLEXPEN) 100 UNIT/ML Pen Inject 60-70 Units into the skin daily at 10 pm.  . [DISCONTINUED] lubiprostone (AMITIZA) 24 MCG capsule Take 1 capsule (24 mcg total) by mouth 2 (two) times daily with a meal.  . Linaclotide (LINZESS) 145 MCG CAPS capsule Take 1 capsule (145 mcg total) by mouth daily.  . [DISCONTINUED] simvastatin (ZOCOR) 40 MG tablet Take 1 tablet (40 mg total) by mouth at bedtime.   No facility-administered encounter medications on file as of 10/27/2014.          Objective:   Physical Exam  Constitutional: He is oriented to person, place, and time. He appears well-developed and well-nourished.  HENT:  Head: Normocephalic and atraumatic.  Cardiovascular: Normal rate, regular rhythm and normal heart sounds.   Pulmonary/Chest: Effort normal and breath sounds normal.  Neurological: He is alert and oriented to person, place, and time.  Skin: Skin is warm and dry.  Psychiatric: He has a normal mood and affect. His behavior is normal.          Assessment & Plan:  DM -  Uncontrolled.  A1C is 7.8. Will increase his Lantus to 58 units. He also says he's been eating a lot of bread lately and says he will get back on track with his diet. Follow-up in 3 months. ON ARB.    diabetes with peripheral neuropathy-he  is currently on gabapentin 1200 mg 3 times a day and he does feel like it is effective and working well.   Chronic constipation - we'll try switching to Linzess. It's also branded that might be a lower tier on his formulary. He can call me back if the price is high. If he is not able to afford either one that he may have to go back to using something like MiraLAX regularly.  Medication side effect of simvastatin. Added to intolerance list. We will dispose of the extra  medication. We could consider a trial of another statin at some point in time right now I want him to just be off of it for several months.

## 2014-11-13 ENCOUNTER — Other Ambulatory Visit: Payer: Self-pay | Admitting: Family Medicine

## 2014-11-24 ENCOUNTER — Other Ambulatory Visit: Payer: Self-pay | Admitting: Family Medicine

## 2014-12-20 ENCOUNTER — Other Ambulatory Visit: Payer: Self-pay | Admitting: *Deleted

## 2014-12-20 MED ORDER — TOUJEO SOLOSTAR 300 UNIT/ML ~~LOC~~ SOPN: 22.0000 [IU] | PEN_INJECTOR | Freq: Every day | SUBCUTANEOUS | Status: DC

## 2014-12-28 ENCOUNTER — Ambulatory Visit (INDEPENDENT_AMBULATORY_CARE_PROVIDER_SITE_OTHER): Payer: Commercial Managed Care - HMO | Admitting: Family Medicine

## 2014-12-28 DIAGNOSIS — Z23 Encounter for immunization: Secondary | ICD-10-CM

## 2014-12-28 DIAGNOSIS — H6123 Impacted cerumen, bilateral: Secondary | ICD-10-CM | POA: Diagnosis not present

## 2014-12-28 NOTE — Progress Notes (Signed)
   Subjective:    Patient ID: Justin Goodman, male    DOB: 27-Sep-1955, 59 y.o.   MRN: [id number]  HPI  here for surgery and removal for bilateral cerumen impaction. It's affecting his hearing. No pain or other upper respiratory symptoms. He has had this done before in our office.   Review of Systems     Objective:   Physical Exam  Constitutional: He appears well-developed and well-nourished.  HENT:  Both ears impacted with cerumen. After irrigation canals are clear.    Neurological: He is alert.          Assessment & Plan:   cerumen impaction-irrigation performed and patient tolerated well today.  Nani Gasser, MD

## 2015-01-25 ENCOUNTER — Telehealth: Payer: Self-pay | Admitting: *Deleted

## 2015-01-25 NOTE — Telephone Encounter (Signed)
Pt called and stated that he has been experiencing diarrhea and vomiting. I asked him if he has been running a fever he stated that he has not. I advised that he take imodium and eat a bland diet. He stated that he could not wait until Monday to be seen and would like to come in tomorrow. I told him that we could try to get him in tomorrow however because of his sxs there is really nothing else that we would do here for him in our office because this would need to run its course. Pt stated that he had an injury with his hand and that was another reason he needed to be seen . Pt transferred to scheduling for appt.Loralee PacasBarkley, Polly Barner KelliherLynetta

## 2015-01-26 ENCOUNTER — Encounter: Payer: Self-pay | Admitting: Family Medicine

## 2015-01-26 ENCOUNTER — Ambulatory Visit (INDEPENDENT_AMBULATORY_CARE_PROVIDER_SITE_OTHER): Payer: Commercial Managed Care - HMO | Admitting: Family Medicine

## 2015-01-26 ENCOUNTER — Ambulatory Visit (HOSPITAL_BASED_OUTPATIENT_CLINIC_OR_DEPARTMENT_OTHER)
Admission: RE | Admit: 2015-01-26 | Discharge: 2015-01-26 | Disposition: A | Discharge status: Home / Self Care | Payer: Commercial Managed Care - HMO | Source: Ambulatory Visit | Attending: Family Medicine | Admitting: Family Medicine

## 2015-01-26 VITALS — BP 125/77 | HR 102 | Temp 97.3°F | Wt 173.0 lb

## 2015-01-26 DIAGNOSIS — E1140 Type 2 diabetes mellitus with diabetic neuropathy, unspecified: Secondary | ICD-10-CM

## 2015-01-26 DIAGNOSIS — R112 Nausea with vomiting, unspecified: Secondary | ICD-10-CM | POA: Insufficient documentation

## 2015-01-26 DIAGNOSIS — R1031 Right lower quadrant pain: Secondary | ICD-10-CM

## 2015-01-26 DIAGNOSIS — E1149 Type 2 diabetes mellitus with other diabetic neurological complication: Secondary | ICD-10-CM | POA: Diagnosis not present

## 2015-01-26 LAB — BASIC METABOLIC PANEL WITH GFR
BUN: 14 mg/dL (ref 7–25)
CO2: 25 mmol/L (ref 20–31)
Calcium: 10.3 mg/dL (ref 8.6–10.3)
Chloride: 98 mmol/L (ref 98–110)
Creat: 1.02 mg/dL (ref 0.70–1.25)
GFR, Est African American: >89 mL/min (ref >=60)
GFR, Est Non African American: 79 mL/min (ref >=60)
Glucose, Bld: 53 mg/dL — ABNORMAL LOW (ref 65–99)
Potassium: 5.1 mmol/L (ref 3.5–5.3)
Sodium: 134 mmol/L — ABNORMAL LOW (ref 135–146)

## 2015-01-26 LAB — HEPATIC FUNCTION PANEL
ALT: 26 U/L (ref 9–46)
AST: 59 U/L — ABNORMAL HIGH (ref 10–35)
Albumin: 4.9 g/dL (ref 3.6–5.1)
Alkaline Phosphatase: 59 U/L (ref 40–115)
Bilirubin, Direct: 0.1 mg/dL (ref <=0.2)
Indirect Bilirubin: 0.3 mg/dL (ref 0.2–1.2)
TOTAL PROTEIN: 8.3 g/dL — AB (ref 6.1–8.1)
Total Bilirubin: 0.4 mg/dL (ref 0.2–1.2)

## 2015-01-26 LAB — CBC WITH DIFFERENTIAL/PLATELET
BASOS ABS: 0 10*3/uL (ref 0.0–0.1)
Basophils Relative: 0 % (ref 0–1)
EOS PCT: 1 % (ref 0–5)
Eosinophils Absolute: 0 10*3/uL (ref 0.0–0.7)
HEMATOCRIT: 42.5 % (ref 39.0–52.0)
HEMOGLOBIN: 15.1 g/dL (ref 13.0–17.0)
LYMPHS ABS: 1.5 10*3/uL (ref 0.7–4.0)
LYMPHS PCT: 34 % (ref 12–46)
MCH: 29.2 pg (ref 26.0–34.0)
MCHC: 35.5 g/dL (ref 30.0–36.0)
MCV: 82 fL (ref 78.0–100.0)
MONO ABS: 0.4 10*3/uL (ref 0.1–1.0)
MPV: 8 fL — AB (ref 8.6–12.4)
Monocytes Relative: 9 % (ref 3–12)
NEUTROS ABS: 2.4 10*3/uL (ref 1.7–7.7)
Neutrophils Relative %: 56 % (ref 43–77)
Platelets: 196 10*3/uL (ref 150–400)
RBC: 5.18 MIL/uL (ref 4.22–5.81)
RDW: 14.8 % (ref 11.5–15.5)
WBC: 4.3 10*3/uL (ref 4.0–10.5)

## 2015-01-26 LAB — POCT GLYCOSYLATED HEMOGLOBIN (HGB A1C): HEMOGLOBIN A1C: 8.4

## 2015-01-26 MED ORDER — IOHEXOL 300 MG/ML  SOLN
100.0000 mL | Freq: Once | INTRAMUSCULAR | Status: AC | PRN
  Administered 2015-01-26: 100 mL via INTRAVENOUS

## 2015-01-26 MED ORDER — PROMETHAZINE HCL 25 MG PO TABS: 25.0000 mg | ORAL_TABLET | Freq: Three times a day (TID) | ORAL | Status: DC | PRN

## 2015-01-26 NOTE — Progress Notes (Signed)
Subjective:    Patient ID: Justin Goodman, male    DOB: 09-02-1955, 59 y.o.   MRN: [id number]  HPI  9 days of vomiting or diarrhea.  Lots of stomach cramping. + anorexia.  No blood in the stool.  No fever. He has lost 6 lbs since this started. Vomiting some for the water he is drinking.  Has been able to keep down some diet Coke.  Even thinking about food make him want to vomit.  No recent medication changes. He i snot taking any medications for this.  No real alleviating factors.   DM- sugars have been mildly elevated the last 2 weeks even though he is not eating.     Review of Systems   BP 125/77 mmHg  Pulse 102  Temp(Src) 97.3 F (36.3 C) (Oral)  Wt 173 lb (78.472 kg)    Allergies  Allergen Reactions  . Atorvastatin Other (See Comments)    HA   . Glipizide Other (See Comments)    HA  . Losartan Other (See Comments)    Vision changes, HA.   Marland Kitchen Lyrica [Pregabalin] Other (See Comments)    Sedation  . Quinapril Cough  . Simvastatin Other (See Comments)    Severe myalgias.      Past Medical History  Diagnosis Date  . Diabetes mellitus     w/ neuropathy  . COPD (chronic obstructive pulmonary disease) ([place])   . Smoker   . Hypertension   . Chronic back pain     only mild loss of T12-L1 disk ht on xray 1-12    No past surgical history on file.  Social History   Social History  . Marital Status: Divorced    Spouse Name: N/A  . Number of Children: N/A  . Years of Education: N/A   Occupational History  . Not on file.   Social History Main Topics  . Smoking status: Former Smoker -- 0.00 packs/day    Types: Cigarettes  . Smokeless tobacco: Not on file  . Alcohol Use: No  . Drug Use: Not on file  . Sexual Activity: Not on file   Other Topics Concern  . Not on file   Social History Narrative    Family History  Problem Relation Age of Onset  . Diabetes Mother   . Alcohol abuse Father     liver    Outpatient Encounter Prescriptions as of 01/26/2015   Medication Sig  . ACCU-CHEK FASTCLIX LANCETS MISC Check blood glucose 4 times daily Diagnosis diabetes E11.40  . albuterol (PROAIR HFA) 108 (90 BASE) MCG/ACT inhaler Inhale 2 puffs into the lungs every 6 (six) hours as needed for wheezing.  . Alcohol Swabs (B-D SINGLE USE SWABS REGULAR) PADS Check blood glucose 4 times daily Diagnosis diabetes E11.40  . AMBULATORY NON FORMULARY MEDICATION Medication Name: Glucometer with strips and lancet to test once a day (box of 100)  Dx 250.00 (diabetes.  Marland Kitchen amitriptyline (ELAVIL) 50 MG tablet TAKE ONE TABLET BY MOUTH AT BEDTIME  . aspirin EC 81 MG tablet Take 1 tablet (81 mg total) by mouth daily.  . Blood Glucose Calibration (ACCU-CHEK SMARTVIEW CONTROL) LIQD Check blood glucose 4 times daily Diagnosis diabetes E11.40  . Blood Glucose Monitoring Suppl (ACCU-CHEK NANO SMARTVIEW) W/DEVICE KIT Check blood glucose 4 times daily Diagnosis diabetes E11.40  . Fluticasone Furoate-Vilanterol (BREO ELLIPTA) 100-25 MCG/INH AEPB Inhale 1 puff into the lungs daily.  Marland Kitchen gabapentin (NEURONTIN) 600 MG tablet Take 2 tablets (1,200 mg total) by mouth  3 (three) times daily.  Marland Kitchen glimepiride (AMARYL) 4 MG tablet TAKE ONE TABLET BY MOUTH ONCE DAILY BEFORE BREAKFAST  . glucose blood (ACCU-CHEK SMARTVIEW) test strip Check blood glucose 4 times daily Diagnosis diabetes E11.40  . Insulin Pen Needle (B-D ULTRAFINE III SHORT PEN) 31G X 8 MM MISC 1 Units by Does not apply route daily.  . Insulin Syringe-Needle U-100 30G X 1/2" 1 ML MISC As directed  . Linaclotide (LINZESS) 145 MCG CAPS capsule Take 1 capsule (145 mcg total) by mouth daily.  Marland Kitchen losartan (COZAAR) 50 MG tablet Take 1 tablet (50 mg total) by mouth daily.  . meloxicam (MOBIC) 15 MG tablet Take 1 tablet (15 mg total) by mouth daily.  . metFORMIN (GLUCOPHAGE) 1000 MG tablet TAKE ONE TABLET BY MOUTH TWICE DAILY WITH MEALS  . metoprolol (LOPRESSOR) 50 MG tablet Take 0.5 tablets (25 mg total) by mouth 2 (two) times daily.  Marland Kitchen  omeprazole (PRILOSEC) 40 MG capsule Take 1 capsule (40 mg total) by mouth daily.  . SYMBICORT 160-4.5 MCG/ACT inhaler INHALE TWO PUFFS BY MOUTH TWICE DAILY  . TOUJEO SOLOSTAR 300 UNIT/ML SOPN Inject 22 Units into the skin daily.  . traZODone (DESYREL) 150 MG tablet TAKE ONE TABLET BY MOUTH AT BEDTIME  . [DISCONTINUED] glimepiride (AMARYL) 4 MG tablet Take 1 tablet (4 mg total) by mouth daily with breakfast.  . promethazine (PHENERGAN) 25 MG tablet Take 1 tablet (25 mg total) by mouth every 8 (eight) hours as needed for nausea or vomiting.   No facility-administered encounter medications on file as of 01/26/2015.          Objective:   Physical Exam  Constitutional: He is oriented to person, place, and time. He appears well-developed and well-nourished.  HENT:  Head: Normocephalic and atraumatic.  Cardiovascular: Normal rate, regular rhythm and normal heart sounds.   Pulmonary/Chest: Effort normal and breath sounds normal.  Abdominal: He exhibits no distension and no mass. There is tenderness. There is no rebound and no guarding.  Abdominal wall with mutiple fasiculations. Tenderness in the RLQ. nontender elsewhere.    Neurological: He is alert and oriented to person, place, and time.  Skin: Skin is warm and dry.  Psychiatric: He has a normal mood and affect. His behavior is normal.          Assessment & Plan:  N/V with RLQ tendenress on exam - will get CT to eval for appendicitis.  Consider viral.  Bowel obstruction less likley with persistant diarrhea. Phenergan sent for nausea. Hold ACE to avoid any AKI. He feel very sleep and fatigued.    DM- A12C is up today.  Diabetes is uncontrolled. Will adjust medication later once we work up the N/V/Abd pain.

## 2015-01-29 ENCOUNTER — Ambulatory Visit: Payer: Commercial Managed Care - HMO | Admitting: Family Medicine

## 2015-02-05 ENCOUNTER — Telehealth: Payer: Self-pay

## 2015-02-05 ENCOUNTER — Other Ambulatory Visit: Payer: Self-pay

## 2015-02-05 MED ORDER — TOUJEO SOLOSTAR 300 UNIT/ML ~~LOC~~ SOPN
60.0000 [IU] | PEN_INJECTOR | Freq: Every day | SUBCUTANEOUS | Status: DC
Start: 2015-02-05 — End: 2015-07-10

## 2015-02-05 NOTE — Telephone Encounter (Signed)
Pt states he would like to know what he can do to help his blood flow while checking his sugar levels. States wants a refill of blood thinning medication but there are none in his history. Please advise.

## 2015-02-05 NOTE — Telephone Encounter (Signed)
I would recommend he just take an 81 mg baby aspirin daily. Try to get the enteric-coated aspirin. Dispense the blood and with his history of diabetes actually probably a good idea to reduce his overall risk of heart attack and stroke.

## 2015-02-06 NOTE — Telephone Encounter (Signed)
Pt states he's not having any results from taking baby asa and he has been taking it for 2 months. Informed him that a blood thinner was not in his chart so that's the reason it was not an option at this point. Please advise.

## 2015-02-06 NOTE — Telephone Encounter (Signed)
I agree. i am not really sure what is concern about his blood flow is.  Is he having any chest pain?

## 2015-02-07 NOTE — Telephone Encounter (Signed)
Pt given message, states not having any chest pains and will continue to try the asa. Informed pt for anymore treatment or concerns with bleeding he will have to be evaluated. No questions or concerns at this time.

## 2015-02-13 DIAGNOSIS — S0990XA Unspecified injury of head, initial encounter: Secondary | ICD-10-CM | POA: Diagnosis not present

## 2015-02-13 DIAGNOSIS — Z87891 Personal history of nicotine dependence: Secondary | ICD-10-CM | POA: Diagnosis not present

## 2015-02-13 DIAGNOSIS — S0003XA Contusion of scalp, initial encounter: Secondary | ICD-10-CM | POA: Diagnosis not present

## 2015-02-13 DIAGNOSIS — R079 Chest pain, unspecified: Secondary | ICD-10-CM | POA: Diagnosis not present

## 2015-02-13 DIAGNOSIS — I10 Essential (primary) hypertension: Secondary | ICD-10-CM | POA: Diagnosis not present

## 2015-02-13 DIAGNOSIS — Z79899 Other long term (current) drug therapy: Secondary | ICD-10-CM | POA: Diagnosis not present

## 2015-02-13 DIAGNOSIS — Z794 Long term (current) use of insulin: Secondary | ICD-10-CM | POA: Diagnosis not present

## 2015-02-13 DIAGNOSIS — E162 Hypoglycemia, unspecified: Secondary | ICD-10-CM | POA: Diagnosis not present

## 2015-02-13 DIAGNOSIS — K219 Gastro-esophageal reflux disease without esophagitis: Secondary | ICD-10-CM | POA: Diagnosis not present

## 2015-02-13 DIAGNOSIS — E161 Other hypoglycemia: Secondary | ICD-10-CM | POA: Diagnosis not present

## 2015-02-13 DIAGNOSIS — E1165 Type 2 diabetes mellitus with hyperglycemia: Secondary | ICD-10-CM | POA: Diagnosis not present

## 2015-02-13 DIAGNOSIS — S40021A Contusion of right upper arm, initial encounter: Secondary | ICD-10-CM | POA: Diagnosis not present

## 2015-02-13 DIAGNOSIS — S5011XA Contusion of right forearm, initial encounter: Secondary | ICD-10-CM | POA: Diagnosis not present

## 2015-02-13 DIAGNOSIS — T796XXA Traumatic ischemia of muscle, initial encounter: Secondary | ICD-10-CM | POA: Diagnosis not present

## 2015-02-13 DIAGNOSIS — S4991XA Unspecified injury of right shoulder and upper arm, initial encounter: Secondary | ICD-10-CM | POA: Diagnosis not present

## 2015-02-13 DIAGNOSIS — R4182 Altered mental status, unspecified: Secondary | ICD-10-CM | POA: Diagnosis not present

## 2015-02-15 ENCOUNTER — Ambulatory Visit (INDEPENDENT_AMBULATORY_CARE_PROVIDER_SITE_OTHER): Payer: Commercial Managed Care - HMO | Admitting: Family Medicine

## 2015-02-15 ENCOUNTER — Encounter: Payer: Self-pay | Admitting: Family Medicine

## 2015-02-15 VITALS — BP 110/69 | HR 101 | Wt 181.0 lb

## 2015-02-15 DIAGNOSIS — R748 Abnormal levels of other serum enzymes: Secondary | ICD-10-CM

## 2015-02-15 DIAGNOSIS — E162 Hypoglycemia, unspecified: Secondary | ICD-10-CM

## 2015-02-15 DIAGNOSIS — E1142 Type 2 diabetes mellitus with diabetic polyneuropathy: Secondary | ICD-10-CM | POA: Diagnosis not present

## 2015-02-15 DIAGNOSIS — J339 Nasal polyp, unspecified: Secondary | ICD-10-CM

## 2015-02-15 LAB — GLUCOSE, POCT (MANUAL RESULT ENTRY): POC GLUCOSE: 56 mg/dL — AB (ref 70–99)

## 2015-02-15 NOTE — Progress Notes (Signed)
Subjective:    Patient ID: Justin Goodman, male    DOB: December 06, 1955, 59 y.o.   MRN: [id number]  HPI Justin Goodman is here for a follow-up office visit after an emergency department visit to [facility]. He was at home and take his medications in the morning as he typically does and ate breakfast. After that he started to move around but felt like he was extremely weak and fell to the floor. Between 8 AM and noon he fell multiple times. He finally called EMS. His glucometer reading was in the low 20s. He was given an amp of D50. He did not lose consciousness. He had some nausea but no vomiting. He did hit his head during one of the falls. He did have a negative head CT. Chest x-ray was normal. EKG was normal. His CK level was elevated at 1098. Did have a scalp contusion and some bruising. He is also on metformin. We had actually increased his insulin when I saw him last time. It sounds like it also been trying to improve his diet as well.  Review of Systems  BP 110/69 mmHg  Pulse 101  Wt 181 lb (82.101 kg)  SpO2 100%    Allergies  Allergen Reactions  . Atorvastatin Other (See Comments)    HA   . Glipizide Other (See Comments)    HA  . Losartan Other (See Comments)    Vision changes, HA.   Marland Kitchen Lyrica [Pregabalin] Other (See Comments)    Sedation  . Quinapril Cough  . Simvastatin Other (See Comments)    Severe myalgias.      Past Medical History  Diagnosis Date  . Diabetes mellitus     w/ neuropathy  . COPD (chronic obstructive pulmonary disease) ([place])   . Smoker   . Hypertension   . Chronic back pain     only mild loss of T12-L1 disk ht on xray 1-12    No past surgical history on file.  Social History   Social History  . Marital Status: Divorced    Spouse Name: N/A  . Number of Children: N/A  . Years of Education: N/A   Occupational History  . Not on file.   Social History Main Topics  . Smoking status: Former Smoker -- 0.00 packs/day    Types:  Cigarettes  . Smokeless tobacco: Not on file  . Alcohol Use: No  . Drug Use: Not on file  . Sexual Activity: Not on file   Other Topics Concern  . Not on file   Social History Narrative    Family History  Problem Relation Age of Onset  . Diabetes Mother   . Alcohol abuse Father     liver    Outpatient Encounter Prescriptions as of 02/15/2015  Medication Sig  . ACCU-CHEK FASTCLIX LANCETS MISC Check blood glucose 4 times daily Diagnosis diabetes E11.40  . albuterol (PROAIR HFA) 108 (90 BASE) MCG/ACT inhaler Inhale 2 puffs into the lungs every 6 (six) hours as needed for wheezing.  . Alcohol Swabs (B-D SINGLE USE SWABS REGULAR) PADS Check blood glucose 4 times daily Diagnosis diabetes E11.40  . AMBULATORY NON FORMULARY MEDICATION Medication Name: Glucometer with strips and lancet to test once a day (box of 100)  Dx 250.00 (diabetes.  Marland Kitchen amitriptyline (ELAVIL) 50 MG tablet TAKE ONE TABLET BY MOUTH AT BEDTIME  . aspirin EC 81 MG tablet Take 1 tablet (81 mg total) by mouth daily.  . Blood Glucose Calibration (ACCU-CHEK SMARTVIEW CONTROL)  LIQD Check blood glucose 4 times daily Diagnosis diabetes E11.40  . Blood Glucose Monitoring Suppl (ACCU-CHEK NANO SMARTVIEW) W/DEVICE KIT Check blood glucose 4 times daily Diagnosis diabetes E11.40  . Fluticasone Furoate-Vilanterol (BREO ELLIPTA) 100-25 MCG/INH AEPB Inhale 1 puff into the lungs daily.  Marland Kitchen gabapentin (NEURONTIN) 600 MG tablet Take 2 tablets (1,200 mg total) by mouth 3 (three) times daily.  Marland Kitchen glimepiride (AMARYL) 4 MG tablet TAKE ONE TABLET BY MOUTH ONCE DAILY BEFORE BREAKFAST  . glucose blood (ACCU-CHEK SMARTVIEW) test strip Check blood glucose 4 times daily Diagnosis diabetes E11.40  . Insulin Pen Needle (B-D ULTRAFINE III SHORT PEN) 31G X 8 MM MISC 1 Units by Does not apply route daily.  . Insulin Syringe-Needle U-100 30G X 1/2" 1 ML MISC As directed  . losartan (COZAAR) 50 MG tablet Take 1 tablet (50 mg total) by mouth daily.  .  meloxicam (MOBIC) 15 MG tablet Take 1 tablet (15 mg total) by mouth daily.  . metFORMIN (GLUCOPHAGE) 1000 MG tablet TAKE ONE TABLET BY MOUTH TWICE DAILY WITH MEALS  . metoprolol (LOPRESSOR) 50 MG tablet Take 0.5 tablets (25 mg total) by mouth 2 (two) times daily.  Marland Kitchen omeprazole (PRILOSEC) 40 MG capsule Take 1 capsule (40 mg total) by mouth daily.  . promethazine (PHENERGAN) 25 MG tablet Take 1 tablet (25 mg total) by mouth every 8 (eight) hours as needed for nausea or vomiting.  . SYMBICORT 160-4.5 MCG/ACT inhaler INHALE TWO PUFFS BY MOUTH TWICE DAILY  . TOUJEO SOLOSTAR 300 UNIT/ML SOPN Inject 60 Units into the skin daily.  . traZODone (DESYREL) 150 MG tablet TAKE ONE TABLET BY MOUTH AT BEDTIME  . [DISCONTINUED] Linaclotide (LINZESS) 145 MCG CAPS capsule Take 1 capsule (145 mcg total) by mouth daily.   No facility-administered encounter medications on file as of 02/15/2015.          Objective:   Physical Exam  Constitutional: He is oriented to person, place, and time. He appears well-developed and well-nourished.  HENT:  Head: Normocephalic and atraumatic.  Right Ear: External ear normal.  Left Ear: External ear normal.  Nose: Nose normal.  Mouth/Throat: Oropharynx is clear and moist.  TMs and canals are clear. Op appears dry as well.   Eyes: Conjunctivae and EOM are normal. Pupils are equal, round, and reactive to light.  Neck: Neck supple. No thyromegaly present.  Cardiovascular: Normal rate, regular rhythm and normal heart sounds.   Pulmonary/Chest: Effort normal and breath sounds normal.  Lymphadenopathy:    He has no cervical adenopathy.  Neurological: He is alert and oriented to person, place, and time.  Skin: Skin is warm and dry.  Contusion on right forehead and bilateral elbows. No broken skin. Mostly just bruising and a slight abrasion on the left elbow.  Psychiatric: He has a normal mood and affect. His behavior is normal.          Assessment & Plan:  Diabetes  with hypoglycemia-we'll decrease Toujeo to 40 units. Increase food intake today. Gave him a candy bar here in the office today before he goes down for blood work and before he goes home today. Encouraged him to check it again when he gets home. He is mentating well.  Also encouraged him to increase his water intake and hydrate well. His mouth appears to be dry today.  ENT - referral for polyps in the left nasal maxillary sinuses. Seen on CT.  Elevated CK-most like likely secondary to the repetitive falls. Would like to repeat  that today to make sure the levels coming down. Next  Elevated liver enzyme-One of the liver enzymes was just mildly elevated. Will recheck that today as well.

## 2015-02-15 NOTE — Patient Instructions (Signed)
Decrease the Toujeo to 40 units.

## 2015-02-16 ENCOUNTER — Other Ambulatory Visit: Payer: Self-pay | Admitting: Family Medicine

## 2015-02-16 DIAGNOSIS — R748 Abnormal levels of other serum enzymes: Secondary | ICD-10-CM

## 2015-02-16 LAB — COMPLETE METABOLIC PANEL WITH GFR
ALT: 33 U/L (ref 9–46)
AST: 66 U/L — ABNORMAL HIGH (ref 10–35)
Albumin: 4.3 g/dL (ref 3.6–5.1)
Alkaline Phosphatase: 50 U/L (ref 40–115)
BILIRUBIN TOTAL: 0.5 mg/dL (ref 0.2–1.2)
BUN: 11 mg/dL (ref 7–25)
CO2: 21 mmol/L (ref 20–31)
CREATININE: 0.81 mg/dL (ref 0.70–1.25)
Calcium: 9.1 mg/dL (ref 8.6–10.3)
Chloride: 106 mmol/L (ref 98–110)
GFR, Est African American: >89 mL/min (ref >=60)
GFR, Est Non African American: >89 mL/min (ref >=60)
GLUCOSE: 47 mg/dL — AB (ref 65–99)
Potassium: 4.7 mmol/L (ref 3.5–5.3)
SODIUM: 138 mmol/L (ref 135–146)
TOTAL PROTEIN: 6.9 g/dL (ref 6.1–8.1)

## 2015-02-16 LAB — CK: CK TOTAL: 1794 U/L — AB (ref 7–232)

## 2015-02-26 ENCOUNTER — Other Ambulatory Visit: Payer: Self-pay | Admitting: Family Medicine

## 2015-02-28 ENCOUNTER — Ambulatory Visit (INDEPENDENT_AMBULATORY_CARE_PROVIDER_SITE_OTHER): Payer: Commercial Managed Care - HMO | Admitting: Family Medicine

## 2015-02-28 ENCOUNTER — Encounter: Payer: Self-pay | Admitting: Family Medicine

## 2015-02-28 VITALS — BP 124/75 | HR 85 | Temp 98.4°F | Resp 18 | Wt 181.1 lb

## 2015-02-28 DIAGNOSIS — E1142 Type 2 diabetes mellitus with diabetic polyneuropathy: Secondary | ICD-10-CM | POA: Diagnosis not present

## 2015-02-28 DIAGNOSIS — R748 Abnormal levels of other serum enzymes: Secondary | ICD-10-CM

## 2015-02-28 LAB — CK: CK TOTAL: 104 U/L (ref 7–232)

## 2015-02-28 NOTE — Progress Notes (Signed)
   Subjective:    Patient ID: Justin ChimesMichael Belcourt, male    DOB: 03-17-1956, 59 y.o.   MRN: [id number]  HPI Diabetes - no hypoglycemic events. No wounds or sores that are not healing well. No increased thirst or urination. Checking glucose at home. Taking medications as prescribed without any side effects.  115-153. No lows.  We had decreased his Toujeo to 40 units and his blood sugars have been great. Has not had any highs or lows.  Elevated CK - overall he is feeling much better. As far as the muscle pain and joint pain. Is not completely resolved but it is better.    Review of Systems     Objective:   Physical Exam  Constitutional: He is oriented to person, place, and time. He appears well-developed and well-nourished.  HENT:  Head: Normocephalic and atraumatic.  Cardiovascular: Normal rate, regular rhythm and normal heart sounds.   Pulmonary/Chest: Effort normal and breath sounds normal.  Neurological: He is alert and oriented to person, place, and time.  Skin: Skin is warm and dry.  Psychiatric: He has a normal mood and affect. His behavior is normal.          Assessment & Plan:  Diabetes-currently doing well. Keep follow-up in January for Next A1c. Continue with Toujeo 40 units. If he starts to notice any low blood sugars and give us a call back.  Elevated CK-we'll recheck levels. He prefers to wait until next week to do this. He tried to drive to his rheumatology referral Ms. says that he got lost and could not find a location. Even stopped it several restaurants asking couldn't find out where was. He will pertinent prefer [place] in [place]. I explained there are no rheumatologists in [place] and recommend that we [place] to [place]. He says he would prefer to try a different office.  So polyps-he says he has not heard back from the ENT referral. We will check on this and try to get back with him. The referral was placed 3 weeks ago and we did  get authorization for it.

## 2015-03-05 ENCOUNTER — Other Ambulatory Visit: Payer: Self-pay | Admitting: Family Medicine

## 2015-03-19 ENCOUNTER — Other Ambulatory Visit: Payer: Self-pay | Admitting: Family Medicine

## 2015-03-19 DIAGNOSIS — J339 Nasal polyp, unspecified: Secondary | ICD-10-CM | POA: Diagnosis not present

## 2015-03-19 MED ORDER — AMITRIPTYLINE HCL 50 MG PO TABS: 50.0000 mg | ORAL_TABLET | Freq: Every day | ORAL | Status: DC

## 2015-04-04 ENCOUNTER — Other Ambulatory Visit: Payer: Self-pay | Admitting: Family Medicine

## 2015-04-10 ENCOUNTER — Encounter: Payer: Self-pay | Admitting: Family Medicine

## 2015-04-10 ENCOUNTER — Ambulatory Visit (INDEPENDENT_AMBULATORY_CARE_PROVIDER_SITE_OTHER): Payer: Commercial Managed Care - HMO | Admitting: Family Medicine

## 2015-04-10 VITALS — BP 140/73 | HR 89 | Wt 183.0 lb

## 2015-04-10 DIAGNOSIS — J45909 Unspecified asthma, uncomplicated: Secondary | ICD-10-CM

## 2015-04-10 DIAGNOSIS — E1142 Type 2 diabetes mellitus with diabetic polyneuropathy: Secondary | ICD-10-CM | POA: Diagnosis not present

## 2015-04-10 DIAGNOSIS — J449 Chronic obstructive pulmonary disease, unspecified: Secondary | ICD-10-CM

## 2015-04-10 DIAGNOSIS — J209 Acute bronchitis, unspecified: Secondary | ICD-10-CM

## 2015-04-10 DIAGNOSIS — G8929 Other chronic pain: Secondary | ICD-10-CM

## 2015-04-10 DIAGNOSIS — I10 Essential (primary) hypertension: Secondary | ICD-10-CM

## 2015-04-10 DIAGNOSIS — J019 Acute sinusitis, unspecified: Secondary | ICD-10-CM

## 2015-04-10 LAB — POCT GLYCOSYLATED HEMOGLOBIN (HGB A1C): Hemoglobin A1C: 6.6

## 2015-04-10 MED ORDER — MELOXICAM 15 MG PO TABS: 15.0000 mg | ORAL_TABLET | Freq: Every day | ORAL | Status: DC

## 2015-04-10 MED ORDER — PREDNISONE 20 MG PO TABS: 40.0000 mg | ORAL_TABLET | Freq: Every day | ORAL | Status: DC

## 2015-04-10 MED ORDER — AZITHROMYCIN 250 MG PO TABS: ORAL_TABLET | ORAL | Status: DC

## 2015-04-10 MED ORDER — AMITRIPTYLINE HCL 50 MG PO TABS: 50.0000 mg | ORAL_TABLET | Freq: Every day | ORAL | Status: DC

## 2015-04-10 NOTE — Progress Notes (Signed)
   Subjective:    Patient ID: Justin ChimesMichael Goodman, male    DOB: Jul 31, 1955, 60 y.o.   MRN: [id number]  HPI Diabetes with neuropathy- no hypoglycemic events. No wounds or sores that are not healing well. No increased thirst or urination. Checking glucose at home. Taking medications as prescribed without any side effects.On Toujeo 40 units.    Asthma with COPD  - has been wheezing some every last week while he's been sick. His chest feels tight this morning.Marland Kitchen.    Hypertension- Pt denies chest pain, SOB, dizziness, or heart palpitations.  Taking meds as directed w/o problems.  Denies medication side effects.    Has been getting some chest tightness and some cough for about a weeks.  No fever, or chillls.  Cough is dry.  No ST.  Has some sinus congestion as well.     Review of Systems     Objective:   Physical Exam  Constitutional: He is oriented to person, place, and time. He appears well-developed and well-nourished.  HENT:  Head: Normocephalic and atraumatic.  Cardiovascular: Normal rate, regular rhythm and normal heart sounds.   Pulmonary/Chest: Effort normal and breath sounds normal.  Neurological: He is alert and oriented to person, place, and time.  Skin: Skin is warm and dry.  Psychiatric: He has a normal mood and affect. His behavior is normal.          Assessment & Plan:  DM with neuropathy- Well controlled. A1 of 6.1 . Continue current regimen.  Reminded to make a yearly eye exam.  Foot exam performed today with abnormalities on the right.    Acute sinusitis/bronchitis - will treat with azithromycin and prednisone. Essentially since he's been wheezing more with his asthma.  HTN - blood pressure normally well controlled. Systolic just slightly elevated today. We'll continue to monitor blood pressure. I'll up in 3 months.  Asthma with COPD-recent flare with recent respiratory infection- 5 DAY COURSE OF PREDNISONE.

## 2015-04-24 ENCOUNTER — Telehealth: Payer: Self-pay

## 2015-04-24 MED ORDER — LEVOFLOXACIN 500 MG PO TABS: 500.0000 mg | ORAL_TABLET | Freq: Every day | ORAL | Status: AC

## 2015-04-24 NOTE — Telephone Encounter (Signed)
Patient advised.

## 2015-04-24 NOTE — Telephone Encounter (Signed)
Justin Goodman called and he has finished antibiotic and had no relief. He is still congested and has a cough. He would like another antibiotic. Denies fever, chills or sweats.

## 2015-05-30 ENCOUNTER — Other Ambulatory Visit: Payer: Self-pay | Admitting: Family Medicine

## 2015-05-30 ENCOUNTER — Other Ambulatory Visit: Payer: Self-pay | Admitting: *Deleted

## 2015-06-04 ENCOUNTER — Other Ambulatory Visit: Payer: Self-pay | Admitting: Family Medicine

## 2015-06-04 NOTE — Telephone Encounter (Signed)
Due for f/u around april 

## 2015-06-26 DIAGNOSIS — S90811A Abrasion, right foot, initial encounter: Secondary | ICD-10-CM | POA: Diagnosis not present

## 2015-06-26 DIAGNOSIS — W19XXXA Unspecified fall, initial encounter: Secondary | ICD-10-CM | POA: Diagnosis not present

## 2015-06-26 DIAGNOSIS — S6991XA Unspecified injury of right wrist, hand and finger(s), initial encounter: Secondary | ICD-10-CM | POA: Diagnosis not present

## 2015-06-26 DIAGNOSIS — R569 Unspecified convulsions: Secondary | ICD-10-CM | POA: Diagnosis not present

## 2015-06-26 DIAGNOSIS — S92414A Nondisplaced fracture of proximal phalanx of right great toe, initial encounter for closed fracture: Secondary | ICD-10-CM | POA: Diagnosis not present

## 2015-06-26 DIAGNOSIS — Z87891 Personal history of nicotine dependence: Secondary | ICD-10-CM | POA: Diagnosis not present

## 2015-06-26 DIAGNOSIS — I10 Essential (primary) hypertension: Secondary | ICD-10-CM | POA: Diagnosis not present

## 2015-06-26 DIAGNOSIS — S99921A Unspecified injury of right foot, initial encounter: Secondary | ICD-10-CM | POA: Diagnosis not present

## 2015-06-26 DIAGNOSIS — Z79899 Other long term (current) drug therapy: Secondary | ICD-10-CM | POA: Diagnosis not present

## 2015-06-26 DIAGNOSIS — E11649 Type 2 diabetes mellitus with hypoglycemia without coma: Secondary | ICD-10-CM | POA: Diagnosis not present

## 2015-06-26 DIAGNOSIS — S0990XA Unspecified injury of head, initial encounter: Secondary | ICD-10-CM | POA: Diagnosis not present

## 2015-06-26 DIAGNOSIS — E1140 Type 2 diabetes mellitus with diabetic neuropathy, unspecified: Secondary | ICD-10-CM | POA: Diagnosis not present

## 2015-06-26 DIAGNOSIS — M7989 Other specified soft tissue disorders: Secondary | ICD-10-CM | POA: Diagnosis not present

## 2015-06-26 DIAGNOSIS — K219 Gastro-esophageal reflux disease without esophagitis: Secondary | ICD-10-CM | POA: Diagnosis not present

## 2015-06-26 DIAGNOSIS — Z794 Long term (current) use of insulin: Secondary | ICD-10-CM | POA: Diagnosis not present

## 2015-06-26 DIAGNOSIS — M19041 Primary osteoarthritis, right hand: Secondary | ICD-10-CM | POA: Diagnosis not present

## 2015-07-10 ENCOUNTER — Ambulatory Visit (INDEPENDENT_AMBULATORY_CARE_PROVIDER_SITE_OTHER): Payer: Commercial Managed Care - HMO | Admitting: Family Medicine

## 2015-07-10 ENCOUNTER — Encounter: Payer: Self-pay | Admitting: Family Medicine

## 2015-07-10 VITALS — BP 127/74 | HR 80 | Wt 174.0 lb

## 2015-07-10 DIAGNOSIS — I10 Essential (primary) hypertension: Secondary | ICD-10-CM

## 2015-07-10 DIAGNOSIS — Z9181 History of falling: Secondary | ICD-10-CM | POA: Diagnosis not present

## 2015-07-10 DIAGNOSIS — E162 Hypoglycemia, unspecified: Secondary | ICD-10-CM

## 2015-07-10 DIAGNOSIS — E1142 Type 2 diabetes mellitus with diabetic polyneuropathy: Secondary | ICD-10-CM | POA: Diagnosis not present

## 2015-07-10 LAB — POCT GLYCOSYLATED HEMOGLOBIN (HGB A1C): Hemoglobin A1C: 6.7

## 2015-07-10 MED ORDER — TOUJEO SOLOSTAR 300 UNIT/ML ~~LOC~~ SOPN: 30.0000 [IU] | PEN_INJECTOR | Freq: Every day | SUBCUTANEOUS | Status: DC

## 2015-07-10 NOTE — Progress Notes (Signed)
   Subjective:    Patient ID: Justin Goodman, male    DOB: 11-18-1955, 60 y.o.   MRN: [id number]  HPI Diabetes - no hypoglycemic events. No wounds or sores that are not healing well. No increased thirst or urination. Checking glucose at home. Taking medications as prescribed without any side effects.  Hypertension- Pt denies chest pain, SOB, dizziness, or heart palpitations.  Taking meds as directed w/o problems.  Denies medication side effects.    He also reports that he had another hypoglycemic episode. He nearly passed out and stumbled and actually broke her toe on his right foot. He went to the hospital March 31 and was told that his blood glucose was in the 50s. They stopped his Amaryl at that point and also decreased his Toujeo down to 30 units. He has been on that for the last week and a half and says that his blood sugars have actually looked really good running between 70 and 110 fasting first thing in the morning.  Review of Systems     Objective:   Physical Exam  Constitutional: He is oriented to person, place, and time. He appears well-developed and well-nourished.  HENT:  Head: Normocephalic and atraumatic.  Cardiovascular: Normal rate, regular rhythm and normal heart sounds.   Pulmonary/Chest: Effort normal and breath sounds normal.  Neurological: He is alert and oriented to person, place, and time.  Skin: Skin is warm and dry.  Psychiatric: He has a normal mood and affect. His behavior is normal.          Assessment & Plan:  DM- well controlled. A1C is 6.7 today.  Will have him continue to hold the Amaryl and continue to stay at 30 units the Toujeo. Follow-up in 3 months.  Reminded due for eye exam.   HTN  - Well controlled. Continue current regimen. Follow up in 6 mo.    Hypoglycemia-See adjustment to medication regimen as above. Monitor carefully.  He reports his toe is healing well and he has been released back to full exercise.

## 2015-07-13 ENCOUNTER — Other Ambulatory Visit: Payer: Self-pay | Admitting: Family Medicine

## 2015-08-29 ENCOUNTER — Other Ambulatory Visit: Payer: Self-pay | Admitting: Family Medicine

## 2015-08-30 ENCOUNTER — Other Ambulatory Visit: Payer: Self-pay | Admitting: Family Medicine

## 2015-09-21 ENCOUNTER — Other Ambulatory Visit: Payer: Self-pay | Admitting: Family Medicine

## 2015-10-01 ENCOUNTER — Other Ambulatory Visit: Payer: Self-pay | Admitting: Family Medicine

## 2015-10-11 ENCOUNTER — Ambulatory Visit (INDEPENDENT_AMBULATORY_CARE_PROVIDER_SITE_OTHER): Payer: Commercial Managed Care - HMO | Admitting: Family Medicine

## 2015-10-11 ENCOUNTER — Encounter: Payer: Self-pay | Admitting: Family Medicine

## 2015-10-11 VITALS — BP 125/64 | HR 75 | Wt 174.0 lb

## 2015-10-11 DIAGNOSIS — E1142 Type 2 diabetes mellitus with diabetic polyneuropathy: Secondary | ICD-10-CM | POA: Diagnosis not present

## 2015-10-11 DIAGNOSIS — J45909 Unspecified asthma, uncomplicated: Secondary | ICD-10-CM

## 2015-10-11 DIAGNOSIS — I10 Essential (primary) hypertension: Secondary | ICD-10-CM | POA: Diagnosis not present

## 2015-10-11 DIAGNOSIS — H6123 Impacted cerumen, bilateral: Secondary | ICD-10-CM | POA: Diagnosis not present

## 2015-10-11 DIAGNOSIS — J449 Chronic obstructive pulmonary disease, unspecified: Secondary | ICD-10-CM | POA: Diagnosis not present

## 2015-10-11 LAB — BASIC METABOLIC PANEL
BUN: 12 mg/dL (ref 7–25)
CALCIUM: 9.8 mg/dL (ref 8.6–10.3)
CO2: 26 mmol/L (ref 20–31)
CREATININE: 0.82 mg/dL (ref 0.70–1.25)
Chloride: 99 mmol/L (ref 98–110)
GLUCOSE: 283 mg/dL — AB (ref 65–99)
Potassium: 4.6 mmol/L (ref 3.5–5.3)
SODIUM: 137 mmol/L (ref 135–146)

## 2015-10-11 LAB — POCT GLYCOSYLATED HEMOGLOBIN (HGB A1C): Hemoglobin A1C: 10

## 2015-10-11 MED ORDER — AZITHROMYCIN 250 MG PO TABS: ORAL_TABLET | ORAL | Status: AC

## 2015-10-11 MED ORDER — AMBULATORY NON FORMULARY MEDICATION: Status: DC

## 2015-10-11 MED ORDER — PREDNISONE 20 MG PO TABS: 40.0000 mg | ORAL_TABLET | Freq: Every day | ORAL | Status: DC

## 2015-10-11 MED ORDER — DULOXETINE HCL 30 MG PO CPEP: 30.0000 mg | ORAL_CAPSULE | Freq: Every day | ORAL | Status: DC

## 2015-10-11 NOTE — Addendum Note (Signed)
Addended by: Deno EtienneBARKLEY, Crystallynn Noorani L on: 10/11/2015 10:55 AM   Modules accepted: Orders, Medications

## 2015-10-11 NOTE — Progress Notes (Addendum)
Subjective:    CC: DM  HPI: Diabetes - no hypoglycemic events. No wounds or sores that are not healing well. No increased thirst or urination. Checking glucose at home. Taking medications as prescribed without any side effects.He has quit testing his blood sugars because he kept getting her messages on his glucometer.He has been exercising regularly. Lab Results  Component Value Date   HGBA1C 6.7 07/10/2015   Diabetic neuropathy-he would like to try something else besides the gabapentin. He does notice a difference when he takes it but is still really struggling with pain control.  Hypertension- Pt denies chest pain, SOB, dizziness, or heart palpitations.  Taking meds as directed w/o problems.  Denies medication side effects.    COPD-he has had some tightness in his chest this week. He feels like he is getting a little bit more sputum production. He denies any fevers chills. He has been oozing his regular inhaler but has not been using his albuterol.  Both of his ears are blocked up with wax and he would like to have it irrigated today of possible.   Past medical history, Surgical history, Family history not pertinant except as noted below, Social history, Allergies, and medications have been entered into the medical record, reviewed, and corrections made.   Review of Systems: No fevers, chills, night sweats, weight loss, chest pain, or shortness of breath.   Objective:    General: Well Developed, well nourished, and in no acute distress.  Neuro: Alert and oriented x3, extra-ocular muscles intact, sensation grossly intact.  HEENT: Normocephalic, atraumatic, both canals blocked by cerumen. Oropharynx is clear.  Skin: Warm and dry, no rashes. Cardiac: Regular rate and rhythm, no murmurs rubs or gallops, no lower extremity edema.  Respiratory: Clear to auscultation bilaterally. Not using accessory muscles, speaking in full sentences. Extremities: No alert, edema.   Impression and  Recommendations:   DM - Uncontrolled. He is off the Amaryl and there is a significant increase in his A1c. Increase Toujeo to 36 units. After 5 days call the office back and we will adjust the dose again based on his blood sugars. Will need to get him a prescription for a new glucometer.  Due for BMP. On an ARB and aspirin. Intolerant to statins.  Diabetic peripheral neuropathy-currently on gabapentin. Will try Cymbalta which is also FDA approved. Continue gabapentin for now as well.  HTN - controlled. Continue current regimen.  Asthma with COPD-having an exacerbation.  Will tx with   Cerumen impaction, bilat - ears irrigated and patient tolerated well.

## 2015-10-11 NOTE — Patient Instructions (Addendum)
Increase Toujeo to 36 units. After 5 days please let me know if her blood sugars are doing and give us a call so that we can make further adjustments over the phone. Continue work on diet and exercise.

## 2015-10-15 ENCOUNTER — Other Ambulatory Visit: Payer: Self-pay | Admitting: Family Medicine

## 2015-11-02 ENCOUNTER — Other Ambulatory Visit: Payer: Self-pay | Admitting: Family Medicine

## 2015-11-05 ENCOUNTER — Other Ambulatory Visit: Payer: Self-pay

## 2015-11-05 MED ORDER — GLUCOSE BLOOD VI STRP: ORAL_STRIP | 99 refills | Status: DC

## 2015-11-05 MED ORDER — ACCU-CHEK NANO SMARTVIEW W/DEVICE KIT: PACK | 0 refills | Status: DC

## 2015-11-30 ENCOUNTER — Other Ambulatory Visit: Payer: Self-pay | Admitting: Family Medicine

## 2015-12-03 ENCOUNTER — Telehealth: Payer: Self-pay | Admitting: Family Medicine

## 2015-12-03 NOTE — Telephone Encounter (Signed)
Patient has a Appt with Dr. Kennon RoundsShane Weldon in [place] on Friday and their office number is [phone] and patient needs a referral. He is going for knee pain in both knees  Please copy all this in comments so cindy will know when appt is and where to send

## 2015-12-04 NOTE — Telephone Encounter (Signed)
Called pt to verify the Dr that he needs the referral for. I informed him that I cannot locate this practice or Dr  He informed me that has also tried to contact and has had to leave messages. He told me to forget about this referral and that he will find another dr..Justin Goodman, Justin Goodman

## 2015-12-07 ENCOUNTER — Other Ambulatory Visit: Payer: Self-pay | Admitting: Family Medicine

## 2015-12-07 ENCOUNTER — Other Ambulatory Visit: Payer: Self-pay | Admitting: *Deleted

## 2015-12-13 ENCOUNTER — Other Ambulatory Visit: Payer: Self-pay | Admitting: Family Medicine

## 2015-12-31 ENCOUNTER — Other Ambulatory Visit: Payer: Self-pay | Admitting: Family Medicine

## 2015-12-31 ENCOUNTER — Other Ambulatory Visit: Payer: Self-pay | Admitting: *Deleted

## 2015-12-31 MED ORDER — TRAZODONE HCL 150 MG PO TABS: 150.0000 mg | ORAL_TABLET | Freq: Every day | ORAL | 3 refills | Status: DC

## 2016-01-10 ENCOUNTER — Ambulatory Visit (INDEPENDENT_AMBULATORY_CARE_PROVIDER_SITE_OTHER): Payer: Commercial Managed Care - HMO | Admitting: Family Medicine

## 2016-01-10 ENCOUNTER — Encounter: Payer: Self-pay | Admitting: Family Medicine

## 2016-01-10 VITALS — BP 124/68 | HR 72 | Ht 69.0 in | Wt 171.0 lb

## 2016-01-10 DIAGNOSIS — E1142 Type 2 diabetes mellitus with diabetic polyneuropathy: Secondary | ICD-10-CM

## 2016-01-10 DIAGNOSIS — M5416 Radiculopathy, lumbar region: Secondary | ICD-10-CM | POA: Diagnosis not present

## 2016-01-10 DIAGNOSIS — I10 Essential (primary) hypertension: Secondary | ICD-10-CM | POA: Diagnosis not present

## 2016-01-10 DIAGNOSIS — Z23 Encounter for immunization: Secondary | ICD-10-CM | POA: Diagnosis not present

## 2016-01-10 DIAGNOSIS — R42 Dizziness and giddiness: Secondary | ICD-10-CM

## 2016-01-10 LAB — POCT GLYCOSYLATED HEMOGLOBIN (HGB A1C): HEMOGLOBIN A1C: 9.1

## 2016-01-10 MED ORDER — BUDESONIDE-FORMOTEROL FUMARATE 160-4.5 MCG/ACT IN AERO: 2.0000 | INHALATION_SPRAY | Freq: Two times a day (BID) | RESPIRATORY_TRACT | 11 refills | Status: DC

## 2016-01-10 NOTE — Patient Instructions (Signed)
Increase Toujeo to 32 units at bedtime.

## 2016-01-10 NOTE — Progress Notes (Signed)
Subjective:    CC: HTN, DM  HPI:  Diabetes - no hypoglycemic events. No wounds or sores that are not healing well. No increased thirst or urination. Checking glucose at home. Taking medications as prescribed without any side effects.He is still working out at [employer] gym.  Hypertension- Pt denies chest pain, SOB, dizziness, or heart palpitations.  Taking meds as directed w/o problems.  Denies medication side effects.    Peripheral Neuropathy - On gabapentin daily.  Asthma with COPD-  No recent flares or exacerbations. Not using his symbicort regularly.  He says occ gets SOB at the gym.    He has been getting intermittent dizziness for the last several months. He said it can happen multiple times a day and then not happen at all. He is unable to relate it to anything specific. He says his blood sugars have not been low or very elevated. He doesn't think it's related to his blood pressure. No chest pain or shortness of breath.  Past medical history, Surgical history, Family history not pertinant except as noted below, Social history, Allergies, and medications have been entered into the medical record, reviewed, and corrections made.   Review of Systems: No fevers, chills, night sweats, weight loss, chest pain, or shortness of breath.   Objective:    General: Well Developed, well nourished, and in no acute distress.  Neuro: Alert and oriented x3, extra-ocular muscles intact, sensation grossly intact.  HEENT: Normocephalic, atraumatic  Skin: Warm and dry, no rashes. Cardiac: Regular rate and rhythm, no murmurs rubs or gallops, no lower extremity edema.  Respiratory: Clear to auscultation bilaterally. Not using accessory muscles, speaking in full sentences. Neuro: Tennille nerves 2 through 12 intact. Negative Dix-Hallpike maneuver.   Impression and Recommendations:    DM- Uncontrolled but hemoglobin A1c is improved today. It sounds like his blood sugars of the last 2 weeks have actually been  optimal running between 99 and 110 at home. We will go ahead and increase Toujeo by 2 units and then have him continue with current diet and exercise which is doing since the last couple weeks have looked great. Follow-up in 3 months.  HTN - Well controlled. Continue current regimen. Follow up in  6 mo.   Neuropathy - stable. Happy with current regimen of gabapentin.  Dizziness-unclear etiology at this point. He has a difficult to BishopScott time actually describing the symptoms for me. He does have a negative Dix-Hallpike maneuver. Unable to visualize the tympanic membranes well as of cerumen.

## 2016-01-15 ENCOUNTER — Other Ambulatory Visit: Payer: Self-pay | Admitting: Family Medicine

## 2016-01-15 DIAGNOSIS — R42 Dizziness and giddiness: Secondary | ICD-10-CM | POA: Diagnosis not present

## 2016-01-15 DIAGNOSIS — I10 Essential (primary) hypertension: Secondary | ICD-10-CM | POA: Diagnosis not present

## 2016-01-15 DIAGNOSIS — E1142 Type 2 diabetes mellitus with diabetic polyneuropathy: Secondary | ICD-10-CM | POA: Diagnosis not present

## 2016-01-15 LAB — CBC WITH DIFFERENTIAL/PLATELET
BASOS ABS: 0 {cells}/uL (ref 0–200)
BASOS PCT: 0 %
EOS PCT: 3 %
Eosinophils Absolute: 114 cells/uL (ref 15–500)
HCT: 41.5 % (ref 38.5–50.0)
Hemoglobin: 14.1 g/dL (ref 13.2–17.1)
LYMPHS PCT: 43 %
Lymphs Abs: 1634 cells/uL (ref 850–3900)
MCH: 28.5 pg (ref 27.0–33.0)
MCHC: 34 g/dL (ref 32.0–36.0)
MCV: 83.8 fL (ref 80.0–100.0)
MONOS PCT: 10 %
MPV: 9.2 fL (ref 7.5–12.5)
Monocytes Absolute: 380 cells/uL (ref 200–950)
NEUTROS ABS: 1672 {cells}/uL (ref 1500–7800)
Neutrophils Relative %: 44 %
PLATELETS: 165 10*3/uL (ref 140–400)
RBC: 4.95 MIL/uL (ref 4.20–5.80)
RDW: 14.6 % (ref 11.0–15.0)
WBC: 3.8 10*3/uL (ref 3.8–10.8)

## 2016-01-16 LAB — COMPLETE METABOLIC PANEL WITH GFR
ALBUMIN: 4.4 g/dL (ref 3.6–5.1)
ALK PHOS: 54 U/L (ref 40–115)
ALT: 14 U/L (ref 9–46)
AST: 17 U/L (ref 10–35)
BUN: 18 mg/dL (ref 7–25)
CALCIUM: 9.5 mg/dL (ref 8.6–10.3)
CO2: 26 mmol/L (ref 20–31)
Chloride: 99 mmol/L (ref 98–110)
Creat: 0.79 mg/dL (ref 0.70–1.25)
GFR, Est African American: >89 mL/min (ref >=60)
Glucose, Bld: 183 mg/dL — ABNORMAL HIGH (ref 65–99)
POTASSIUM: 4.6 mmol/L (ref 3.5–5.3)
Sodium: 134 mmol/L — ABNORMAL LOW (ref 135–146)
Total Bilirubin: 0.4 mg/dL (ref 0.2–1.2)
Total Protein: 7.3 g/dL (ref 6.1–8.1)

## 2016-01-16 LAB — LIPID PANEL
CHOL/HDL RATIO: 7.6 ratio — AB (ref <=5.0)
CHOLESTEROL: 168 mg/dL (ref 125–200)
HDL: 22 mg/dL — AB (ref >=40)
LDL Cholesterol: 84 mg/dL (ref <130)
TRIGLYCERIDES: 311 mg/dL — AB (ref <150)
VLDL: 62 mg/dL — ABNORMAL HIGH (ref <30)

## 2016-01-16 LAB — TSH: TSH: 2.31 m[IU]/L (ref 0.40–4.50)

## 2016-01-16 MED ORDER — METOPROLOL TARTRATE 50 MG PO TABS: 25.0000 mg | ORAL_TABLET | Freq: Two times a day (BID) | ORAL | 3 refills | Status: DC

## 2016-01-16 MED ORDER — GABAPENTIN 600 MG PO TABS: 1200.0000 mg | ORAL_TABLET | Freq: Three times a day (TID) | ORAL | 4 refills | Status: DC

## 2016-03-04 ENCOUNTER — Telehealth: Payer: Self-pay

## 2016-03-04 NOTE — Telephone Encounter (Signed)
Ok to refill medication for 2 pens.

## 2016-03-05 NOTE — Telephone Encounter (Signed)
Called Pharmacy, and gave VO.

## 2016-03-11 ENCOUNTER — Telehealth: Payer: Self-pay | Admitting: *Deleted

## 2016-03-11 NOTE — Telephone Encounter (Signed)
Spoke w/Justin Goodman Gave verbal for pen needles 1 box no refills.Justin Goodman, Davie Sagona SomersetLynetta

## 2016-03-26 ENCOUNTER — Other Ambulatory Visit: Payer: Self-pay | Admitting: *Deleted

## 2016-03-26 MED ORDER — TRAZODONE HCL 150 MG PO TABS: 150.0000 mg | ORAL_TABLET | Freq: Every day | ORAL | 0 refills | Status: DC

## 2016-03-26 NOTE — Telephone Encounter (Signed)
Pt lvm asking for refill to be sent [facility] [place].

## 2016-04-08 ENCOUNTER — Other Ambulatory Visit: Payer: Self-pay | Admitting: Family Medicine

## 2016-04-08 ENCOUNTER — Other Ambulatory Visit: Payer: Self-pay | Admitting: *Deleted

## 2016-04-08 MED ORDER — AMITRIPTYLINE HCL 50 MG PO TABS: 50.0000 mg | ORAL_TABLET | Freq: Every day | ORAL | 1 refills | Status: DC

## 2016-04-10 ENCOUNTER — Other Ambulatory Visit: Payer: Self-pay | Admitting: *Deleted

## 2016-04-10 ENCOUNTER — Other Ambulatory Visit: Payer: Self-pay | Admitting: Family Medicine

## 2016-04-10 MED ORDER — METFORMIN HCL 1000 MG PO TABS: 1000.0000 mg | ORAL_TABLET | Freq: Two times a day (BID) | ORAL | 3 refills | Status: DC

## 2016-04-14 ENCOUNTER — Other Ambulatory Visit: Payer: Self-pay | Admitting: *Deleted

## 2016-04-14 NOTE — Telephone Encounter (Signed)
Error

## 2016-04-15 ENCOUNTER — Ambulatory Visit (INDEPENDENT_AMBULATORY_CARE_PROVIDER_SITE_OTHER): Payer: Commercial Managed Care - HMO | Admitting: Family Medicine

## 2016-04-15 ENCOUNTER — Encounter: Payer: Self-pay | Admitting: Family Medicine

## 2016-04-15 VITALS — BP 128/81 | HR 99 | Ht 69.0 in | Wt 174.0 lb

## 2016-04-15 DIAGNOSIS — E1142 Type 2 diabetes mellitus with diabetic polyneuropathy: Secondary | ICD-10-CM | POA: Diagnosis not present

## 2016-04-15 DIAGNOSIS — F5101 Primary insomnia: Secondary | ICD-10-CM

## 2016-04-15 DIAGNOSIS — I10 Essential (primary) hypertension: Secondary | ICD-10-CM | POA: Diagnosis not present

## 2016-04-15 LAB — POCT GLYCOSYLATED HEMOGLOBIN (HGB A1C): HEMOGLOBIN A1C: 7.5

## 2016-04-15 MED ORDER — OMEPRAZOLE 40 MG PO CPDR: 40.0000 mg | DELAYED_RELEASE_CAPSULE | Freq: Every day | ORAL | 4 refills | Status: DC

## 2016-04-15 MED ORDER — LOSARTAN POTASSIUM 50 MG PO TABS: 50.0000 mg | ORAL_TABLET | Freq: Every day | ORAL | 3 refills | Status: DC

## 2016-04-15 MED ORDER — TRAZODONE HCL 150 MG PO TABS: 150.0000 mg | ORAL_TABLET | Freq: Every day | ORAL | 3 refills | Status: DC

## 2016-04-15 MED ORDER — TOUJEO SOLOSTAR 300 UNIT/ML ~~LOC~~ SOPN: 32.0000 [IU] | PEN_INJECTOR | Freq: Every day | SUBCUTANEOUS | 99 refills | Status: DC

## 2016-04-15 NOTE — Progress Notes (Signed)
Subjective:    CC: DM, HTN  HPI: Diabetes - no hypoglycemic events. No wounds or sores that are not healing well. No increased thirst or urination. Checking glucose at home. Taking medications as prescribed without any side effects.  Hypertension- Pt denies chest pain, SOB, dizziness, or heart palpitations.  Taking meds as directed w/o problems.  Denies medication side effects.    F/u Insomnia - doing well on trazodone. Needs refills today. On 150mg .    Past medical history, Surgical history, Family history not pertinant except as noted below, Social history, Allergies, and medications have been entered into the medical record, reviewed, and corrections made.   Review of Systems: No fevers, chills, night sweats, weight loss, chest pain, or shortness of breath.   Objective:    General: Well Developed, well nourished, and in no acute distress.  Neuro: Alert and oriented x3, extra-ocular muscles intact, sensation grossly intact.  HEENT: Normocephalic, atraumatic  Skin: Warm and dry, no rashes. Cardiac: Regular rate and rhythm, no murmurs rubs or gallops, no lower extremity edema.  Respiratory: Clear to auscultation bilaterally. Not using accessory muscles, speaking in full sentences.   Impression and Recommendations:    DM - Hemoglobin A1c much improved this time. It's down to 7.5, previous of 9.1. He went to [place] for a couple of months to visit his sister and brother. He is now back and back into his normal routine. He has started back at the gym this week. Discussed increasing the Toujeo but he really thinks getting back to the gym will help lower his A1c even further. Continue with diet and exercise and follow-up in 3 months. If not at goal at that time been more increased Toujeo. Reminded him to schedule eye exam.  HTN with neuropathy -  stable. No recent exacerbation of symptoms. Continue current regimen. Continue amitriptyline.   Insomnia - refill trazodone.

## 2016-04-21 ENCOUNTER — Other Ambulatory Visit: Payer: Self-pay | Admitting: Family Medicine

## 2016-04-22 ENCOUNTER — Other Ambulatory Visit: Payer: Self-pay | Admitting: *Deleted

## 2016-04-22 MED ORDER — TOUJEO SOLOSTAR 300 UNIT/ML ~~LOC~~ SOPN: 32.0000 [IU] | PEN_INJECTOR | Freq: Every day | SUBCUTANEOUS | 6 refills | Status: DC

## 2016-04-22 MED ORDER — TOUJEO SOLOSTAR 300 UNIT/ML ~~LOC~~ SOPN: 32.0000 [IU] | PEN_INJECTOR | Freq: Every day | SUBCUTANEOUS | 99 refills | Status: DC

## 2016-05-01 ENCOUNTER — Telehealth: Payer: Self-pay | Admitting: *Deleted

## 2016-05-01 NOTE — Telephone Encounter (Signed)
He needs to come in and have the ear checked. He could have an ear infection. If I do not have anything available one of my partners could see him for the ear and cough.

## 2016-05-01 NOTE — Telephone Encounter (Signed)
Pt stated that since he was seen on 04/15/16 his L ear has been causing him pain he also c/o a non productive cough and that his feet are "heavy". When I asked if these sxs have been on and off he stated that they began about 2 days after his last OV. I informed him that he would possibly need to come in to be evaluated. Will fwd to pcp for further advice.Loralee PacasBarkley, Zamiyah Resendes [place]Lynetta

## 2016-05-01 NOTE — Telephone Encounter (Signed)
Called pt and informed him that he will need to be seen however Dr. Linford ArnoldMetheney did not have any openings. appt made for Monday w/Charlie PA-C.Loralee PacasBarkley, Esli Clements [place]etta

## 2016-05-05 ENCOUNTER — Encounter: Payer: Self-pay | Admitting: Physician Assistant

## 2016-05-05 ENCOUNTER — Ambulatory Visit (INDEPENDENT_AMBULATORY_CARE_PROVIDER_SITE_OTHER): Payer: Commercial Managed Care - HMO | Admitting: Physician Assistant

## 2016-05-05 ENCOUNTER — Ambulatory Visit (INDEPENDENT_AMBULATORY_CARE_PROVIDER_SITE_OTHER): Payer: Commercial Managed Care - HMO

## 2016-05-05 VITALS — BP 126/54 | HR 105 | Temp 97.7°F | Wt 176.0 lb

## 2016-05-05 DIAGNOSIS — J449 Chronic obstructive pulmonary disease, unspecified: Secondary | ICD-10-CM | POA: Diagnosis not present

## 2016-05-05 DIAGNOSIS — R05 Cough: Secondary | ICD-10-CM | POA: Diagnosis not present

## 2016-05-05 DIAGNOSIS — J069 Acute upper respiratory infection, unspecified: Secondary | ICD-10-CM | POA: Diagnosis not present

## 2016-05-05 DIAGNOSIS — R059 Cough, unspecified: Secondary | ICD-10-CM

## 2016-05-05 MED ORDER — AZITHROMYCIN 250 MG PO TABS: ORAL_TABLET | ORAL | 0 refills | Status: DC

## 2016-05-05 MED ORDER — ALBUTEROL SULFATE HFA 108 (90 BASE) MCG/ACT IN AERS: 2.0000 | INHALATION_SPRAY | Freq: Four times a day (QID) | RESPIRATORY_TRACT | 3 refills | Status: DC | PRN

## 2016-05-05 NOTE — Progress Notes (Signed)
HPI:                                                                Justin Goodman is a 61 y.o. male who presents to [facility] today for a sick visit  Patient with PMH of COPD, asthma, former smoker (quit date 5 years ago) presenting with cough x 2 weeks Cough  This is a new problem. The current episode started 1 to 4 weeks ago. The problem has been unchanged. The cough is non-productive. Associated symptoms include headaches, myalgias, nasal congestion, shortness of breath and wheezing. Pertinent negatives include no chills, fever, rash, sore throat, sweats or weight loss. Associated symptoms comments: "feet feel heavy" "whole body hurts" . Nothing aggravates the symptoms. Risk factors for lung disease include smoking/tobacco exposure and travel (patient was in [place] november-december). Treatments tried: Mucinex, OTC nasal spray. The treatment provided no relief. His past medical history is significant for COPD and environmental allergies.   Patient reports he stopped taking his Symbicort 3 months ago because he could not afford the $100 co-pay. He has not needed to use his rescue inhaler.    Past Medical History:  Diagnosis Date  . Chronic back pain    only mild loss of T12-L1 disk ht on xray 1-12  . COPD (chronic obstructive pulmonary disease) ([place])   . Diabetes mellitus    w/ neuropathy  . Hypertension   . Smoker    No past surgical history on file. Social History  Substance Use Topics  . Smoking status: Former Smoker    Packs/day: 0.00    Types: Cigarettes    Quit date: 2013  . Smokeless tobacco: Never Used  . Alcohol use No   family history includes Alcohol abuse in his father; Diabetes in his mother.  ROS: negative except as noted in the HPI  Medications: Current Outpatient Prescriptions  Medication Sig Dispense Refill  . ACCU-CHEK FASTCLIX LANCETS MISC Check blood glucose 4 times daily Diagnosis diabetes E11.40 600  each 3  . Alcohol Swabs (B-D SINGLE USE SWABS REGULAR) PADS Check blood glucose 4 times daily Diagnosis diabetes E11.40 600 each 3  . AMBULATORY NON FORMULARY MEDICATION Medication Name: Glucometer with strips and lancet to test once a day (box of 100)  Dx 250.00 (diabetes. 1 Units 3  . amitriptyline (ELAVIL) 50 MG tablet Take 1 tablet (50 mg total) by mouth at bedtime. 90 tablet 1  . aspirin EC 81 MG tablet Take 1 tablet (81 mg total) by mouth daily. 100 tablet 1  . Blood Glucose Calibration (ACCU-CHEK SMARTVIEW CONTROL) LIQD Check blood glucose 4 times daily Diagnosis diabetes E11.40 3 each 3  . Blood Glucose Monitoring Suppl (ACCU-CHEK NANO SMARTVIEW) w/Device KIT Check blood glucose 4 times daily Diagnosis diabetes E11.40 1 kit 0  . budesonide-formoterol (SYMBICORT) 160-4.5 MCG/ACT inhaler Inhale 2 puffs into the lungs 2 (two) times daily. 10.2 g 11  . gabapentin (NEURONTIN) 600 MG tablet Take 2 tablets (1,200 mg total) by mouth 3 (three) times daily. 540 tablet 4  . glucose blood (ACCU-CHEK SMARTVIEW) test strip Check blood glucose 4 times daily Diagnosis diabetes E11.40 400 each prn  . Insulin Pen Needle (B-D ULTRAFINE III SHORT PEN) 31G X 8 MM MISC 1  Units by Does not apply route daily. 90 each 2  . Insulin Syringe-Needle U-100 30G X 1/2" 1 ML MISC As directed 100 each prn  . losartan (COZAAR) 50 MG tablet Take 1 tablet (50 mg total) by mouth daily. 90 tablet 3  . meloxicam (MOBIC) 15 MG tablet Take 1 tablet (15 mg total) by mouth daily. 90 tablet 3  . metFORMIN (GLUCOPHAGE) 1000 MG tablet Take 1 tablet (1,000 mg total) by mouth 2 (two) times daily with a meal. 180 tablet 3  . metoprolol (LOPRESSOR) 50 MG tablet Take 0.5 tablets (25 mg total) by mouth 2 (two) times daily. 180 tablet 3  . omeprazole (PRILOSEC) 40 MG capsule Take 1 capsule (40 mg total) by mouth daily. 90 capsule 4  . TOUJEO SOLOSTAR 300 UNIT/ML SOPN INJECT 60 UNITS SUBCUTANEOUSLY ONCE DAILY 6 pen 3  . TOUJEO SOLOSTAR 300  UNIT/ML SOPN Inject 32 Units into the skin daily. 2 pen 6  . traZODone (DESYREL) 150 MG tablet Take 1 tablet (150 mg total) by mouth at bedtime. 90 tablet 3  . albuterol (PROVENTIL HFA;VENTOLIN HFA) 108 (90 Base) MCG/ACT inhaler Inhale 2 puffs into the lungs every 6 (six) hours as needed for wheezing or shortness of breath. 1 Inhaler 3  . azithromycin (ZITHROMAX Z-PAK) 250 MG tablet Take 2 tablets (500 mg) on  Day 1,  followed by 1 tablet (250 mg) once daily on Days 2 through 5. 6 tablet 0   No current facility-administered medications for this visit.    Allergies  Allergen Reactions  . Amaryl [Glimepiride] Other (See Comments)    Frequent hypoglycemia  . Atorvastatin Other (See Comments)    HA   . Glipizide Other (See Comments)    HA  . Losartan Other (See Comments)    Vision changes, HA.   Marland Kitchen Lyrica [Pregabalin] Other (See Comments)    Sedation  . Quinapril Cough  . Simvastatin Other (See Comments)    Severe myalgias.         Objective:  BP (!) 126/54   Pulse (!) 105   Temp 97.7 F (36.5 C) (Oral)   Wt 176 lb (79.8 kg)   BMI 25.99 kg/m  Gen: well-groomed, cooperative, not ill-appearing, no distress HEENT: normal conjunctiva, ear canals obstructed with cerumen bilaterally, nasal mucosa edematous, oropharynx clear, dry mucus membranes, no frontal or maxillary sinus tenderness Pulm: Normal work of breathing, normal phonation, decreased expiratory breath sounds, clear to auscultation bilaterally, no wheezes, rales or rhonchi CV: Rate 95, regular rhythm, s1 and s2 distinct, no murmurs, clicks or rubs, no peripheral edema Neuro: alert and oriented x 3, EOM's intact Lymph: no cervical or tonsillar adenopathy Skin: warm and dry, no rashes or lesions on exposed skin, no cyanosis   No results found for this or any previous visit (from the past 72 hour(s)). No results found.    Assessment and Plan: 61 y.o. male with   Acute Cough - patient is a good candidate for low-dose  chest CT for lung cancer screening. I was able to get him a same-day appointment, but unfortunately he states he cannot afford the co-pay - DG Chest 2 View; Future - given duration of symptoms and comorbidity will cover for CAP. No signs of acute asthma/COPD exacerbation.  - azithromycin (ZITHROMAX Z-PAK) 250 MG tablet; Take 2 tablets (500 mg) on  Day 1,  followed by 1 tablet (250 mg) once daily on Days 2 through 5.  Dispense: 6 tablet; Refill: 0  Asthma with  COPD (chronic obstructive pulmonary disease) ([place]) - unfortunately, patient is unable to afford corticosteroid inhaler - albuterol (PROVENTIL HFA;VENTOLIN HFA) 108 (90 Base) MCG/ACT inhaler; Inhale 2 puffs into the lungs every 6 (six) hours as needed for wheezing or shortness of breath.  Dispense: 1 Inhaler; Refill: 3 - recommended close follow-up with PCP in 2 weeks   Patient education and anticipatory guidance given Patient agrees with treatment plan Follow-up with PCP in 2 weeks or sooner as needed if symptoms worsen or fail to improve  Darlyne Russian PA-C

## 2016-05-05 NOTE — Patient Instructions (Addendum)
Use Albuterol inhaler 1-2 puffs every 4 hours as needed for wheezing Take Antibiotic as prescribed (2 pills today, 1 pill for days 2 through 5)  Bronchospasm, Adult A bronchospasm is a spasm or tightening of the airways going into the lungs. During a bronchospasm breathing becomes more difficult because the airways get smaller. When this happens there can be coughing, a whistling sound when breathing (wheezing), and difficulty breathing. Bronchospasm is often associated with asthma, but not all patients who experience a bronchospasm have asthma. What are the causes? A bronchospasm is caused by inflammation or irritation of the airways. The inflammation or irritation may be triggered by:  Allergies (such as to animals, pollen, food, or mold). Allergens that cause bronchospasm may cause wheezing immediately after exposure or many hours later.  Infection. Viral infections are believed to be the most common cause of bronchospasm.  Exercise.  Irritants (such as pollution, cigarette smoke, strong odors, aerosol sprays, and paint fumes).  Weather changes. Winds increase molds and pollens in the air. Rain refreshes the air by washing irritants out. Cold air may cause inflammation.  Stress and emotional upset. What are the signs or symptoms?  Wheezing.  Excessive nighttime coughing.  Frequent or severe coughing with a simple cold.  Chest tightness.  Shortness of breath. How is this diagnosed? Bronchospasm is usually diagnosed through a history and physical exam. Tests, such as chest X-rays, are sometimes done to look for other conditions. How is this treated?  Inhaled medicines can be given to open up your airways and help you breathe. The medicines can be given using either an inhaler or a nebulizer machine.  Corticosteroid medicines may be given for severe bronchospasm, usually when it is associated with asthma. Follow these instructions at home:  Always have a plan prepared for  seeking medical care. Know when to call your health care provider and local emergency services (911 in the [place] Know where you can access local emergency care.  Only take medicines as directed by your health care provider.  If you were prescribed an inhaler or nebulizer machine, ask your health care provider to explain how to use it correctly. Always use a spacer with your inhaler if you were given one.  It is necessary to remain calm during an attack. Try to relax and breathe more slowly.  Control your home environment in the following ways:  Change your heating and air conditioning filter at least once a month.  Limit your use of fireplaces and wood stoves.  Do not smoke and do not allow smoking in your home.  Avoid exposure to perfumes and fragrances.  Get rid of pests (such as roaches and mice) and their droppings.  Throw away plants if you see mold on them.  Keep your house clean and dust free.  Replace carpet with wood, tile, or vinyl flooring. Carpet can trap dander and dust.  Use allergy-proof pillows, mattress covers, and box spring covers.  Wash bed sheets and blankets every week in hot water and dry them in a dryer.  Use blankets that are made of polyester or cotton.  Wash hands frequently. Contact a health care provider if:  You have muscle aches.  You have chest pain.  The sputum changes from clear or white to yellow, green, gray, or bloody.  The sputum you cough up gets thicker.  There are problems that may be related to the medicine you are given, such as a rash, itching, swelling, or trouble breathing. Get help right away  if:  You have worsening wheezing and coughing even after taking your prescribed medicines.  You have increased difficulty breathing.  You develop severe chest pain. This information is not intended to replace advice given to you by your health care provider. Make sure you discuss any questions you have with your health care  provider. Document Released: 03/27/2003 Document Revised: 08/30/2015 Document Reviewed: 09/13/2012 Elsevier Interactive Patient Education  2017 [employer]

## 2016-05-09 ENCOUNTER — Other Ambulatory Visit: Payer: Self-pay | Admitting: Family Medicine

## 2016-05-09 DIAGNOSIS — G8929 Other chronic pain: Secondary | ICD-10-CM

## 2016-05-14 ENCOUNTER — Telehealth: Payer: Self-pay | Admitting: Family Medicine

## 2016-05-20 ENCOUNTER — Encounter: Payer: Self-pay | Admitting: Family Medicine

## 2016-05-20 ENCOUNTER — Ambulatory Visit: Payer: Medicare HMO | Admitting: *Deleted

## 2016-05-20 ENCOUNTER — Ambulatory Visit (INDEPENDENT_AMBULATORY_CARE_PROVIDER_SITE_OTHER): Payer: Medicare HMO | Admitting: Family Medicine

## 2016-05-20 ENCOUNTER — Encounter: Payer: Self-pay | Admitting: *Deleted

## 2016-05-20 VITALS — BP 110/64 | HR 80 | Temp 97.7°F | Ht 69.0 in | Wt 180.4 lb

## 2016-05-20 VITALS — BP 110/64 | HR 80 | Ht 69.0 in | Wt 180.0 lb

## 2016-05-20 DIAGNOSIS — Z0001 Encounter for general adult medical examination with abnormal findings: Secondary | ICD-10-CM | POA: Diagnosis not present

## 2016-05-20 DIAGNOSIS — E1142 Type 2 diabetes mellitus with diabetic polyneuropathy: Secondary | ICD-10-CM

## 2016-05-20 DIAGNOSIS — H6123 Impacted cerumen, bilateral: Secondary | ICD-10-CM

## 2016-05-20 DIAGNOSIS — J449 Chronic obstructive pulmonary disease, unspecified: Secondary | ICD-10-CM

## 2016-05-20 DIAGNOSIS — E785 Hyperlipidemia, unspecified: Secondary | ICD-10-CM

## 2016-05-20 DIAGNOSIS — Z0000 Encounter for general adult medical examination without abnormal findings: Secondary | ICD-10-CM

## 2016-05-20 DIAGNOSIS — Z1331 Encounter for screening for depression: Secondary | ICD-10-CM

## 2016-05-20 MED ORDER — PREDNISONE 20 MG PO TABS: 40.0000 mg | ORAL_TABLET | Freq: Every day | ORAL | 0 refills | Status: DC

## 2016-05-20 MED ORDER — CAPSAICIN 0.075 % EX CREA: 1.0000 "application " | TOPICAL_CREAM | Freq: Two times a day (BID) | CUTANEOUS | 99 refills | Status: DC

## 2016-05-20 MED ORDER — UMECLIDINIUM BROMIDE 62.5 MCG/INH IN AEPB: 1.0000 | INHALATION_SPRAY | Freq: Every day | RESPIRATORY_TRACT | 5 refills | Status: DC

## 2016-05-20 NOTE — Progress Notes (Signed)
Subjective:    CC: COPD  HPI:  COPD - He was actually just seen about 2 weeks ago and treated for COPD exacerbation with azithromycin. Was also recommended for him to consider having a low-dose chest CT for lung cancer screening. He had not recently filled his Symbicort because the co-pay was going to be $100.He says he is actually feeling much better after completing the azithromycin but still has a dry cough but no shortness of breath and no fever.  Peripheral neuropathy-he's been having more pain and burning with his feet. He is currently on gabapentin. He is also on amitriptyline 50 mg at bedtime.  He also feels like his ears are clogged.  Would like those looked at today.    Past medical history, Surgical history, Family history not pertinant except as noted below, Social history, Allergies, and medications have been entered into the medical record, reviewed, and corrections made.   Review of Systems: No fevers, chills, night sweats, weight loss, chest pain, or shortness of breath.   Objective:    General: Well Developed, well nourished, and in no acute distress.  Neuro: Alert and oriented x3, extra-ocular muscles intact, sensation grossly intact.  HEENT: Normocephalic, atraumatic, Bilateral cerumen impaction, oropharynx is clear.  Skin: Warm and dry, no rashes. Cardiac: Regular rate and rhythm, no murmurs rubs or gallops, no lower extremity edema.  Respiratory: Clear to auscultation bilaterally. Not using accessory muscles, speaking in full sentences.   Impression and Recommendations:    COPD w/ Asthma - Improved from recent exacerbation. We'll go ahead and give him 5 days of prednisone though just to see if that will help remediate the cough though explained that sometimes cough can last several weeks. It is not actively taking the Symbicort I'm actually going to start him on Incruse. Flu the co-pay will be reasonable.  Peripheral neuropathy, progressing-already on gabapentin and  amitriptyline. Will add capsaicin cream.  He is Justin Goodman had a consult with neurology in the past and declines to do so again today.  BiLateral cerumen impaction-we started to irrigate but he was starting to get uncomfortable so we had to stop and we were not able to actually remove any cerumen today. Encouraged him to use over-the-counter earwax softening drops.

## 2016-05-20 NOTE — Patient Instructions (Addendum)
Health maintenance:  Diabetic Eye Exam-You are due for this.  Colon Cancer Screen-You are due for this (colonoscopy).   Abnormal screenings: None currently.   Patient concerns: Meal prep-Meals on wheels referral.   Nurse concerns:Shingles vaccine for prevention of shingles-never had one; declined today.  Apartment on 2nd floor; weak, painful legs at times, difficult to climb steps.   Next PCP appt: To be determined at end of today's visit with Dr. Linford Arnold.  Follow up in 1 year with [facility] for next AWV.  Justin Goodman   DOB :12-24-1955 MRN :[id number] Procedure Date:  Arrival Time:   Procedure Time:      Location of Procedure:   PREPARATION FOR COLONOSCOPY WITH SUPREP  On  (5 days prior to the procedure) stop eating nuts, seeds, popcorn, corn, beans, salads, or any raw vegetables.  Do not take any fiber supplements (e.g. Metamucil, Citrucel, and Benefiber).    THE DAY BEFORE PROCEDURE:   DATE:    DAY:   1. Drink clear liquids the entire day - NO SOLID FOOD  2. Do not drink anything colored red or purple. Avoid juices with pulp. No orange juice, grapefruit, or tomato juice.  3. Drink at least 64 oz. (8 glasses) of fluid/clear liquids during the day to prevent dehydration and help the prep work efficiently.  CLEAR LIQUIDS INCLUDE:  Water Jello  Ice Popsicles  Tea (sugar ok, no milk/cream) Powdered fruit flavored drinks  Coffee (sugar ok, no milk/cream) Gatorade  Juice: apple, white grape, white cranberry Lemonade  Clear bullion, consomme, broth Carbonated beverages (any kind)  Strained chicken noodle soup Hard Candy   FOLLOW THESE PREP INSTRUCTIONS, NOT INSTRUCTION ON SUPREP BOX.  At 6:00pm: COMPLETE STEPS 1 THROUGH 4 BELOW:  Step 1: Pour ONE (1) 6-ounce bottle of SUPREP liquid into the mixing container. Step 2: Add cool drinking water to the 16-ounce line on the container and mix.   Step 3: Drink ALL of the 16-ounce diluted prep over 15 minutes. Step 4: You MUST  drink an additional two (2) or more 16-ounce containers of water over the next one (1) hour.   Continue to drink clear liquids until bedtime.   ______________________________________________________________________   DAY OF PROCEDURE:   DATE:        DAY:   Beginning at    (5 hours before the procedure):  COMPLETE STEPS 1 THROUGH 4 BELOW:  Step 1: Pour ONE (1) 6-ounce bottle of SUPREP liquid into the mixing container. Step 2: Add cool drinking water to the 16-ounce line on the container and mix.   Step 3: Drink ALL of the 16-ounce diluted prep over 15 minutes.    Step 4: You MUST drink an additional two (2) or more 16-ounce containers of water over the next one (1) hour.  You may drink clear liquids until  (2 hours before procedure).     MEDICATION INSTRUCTIONS  Unless otherwise instructed, you should take regular prescription medications with a small sip of water as early as possible the morning of your procedure.  Diabetic patients - see separate instructions.  Stop taking Plavix or Aggrenox on  (7 days before procedure)  Stop taking Coumadin on  (5 days before procedure)  Additional medication instructions: ______________________________________________________________________  OTHER INSTRUCTIONS  You will need a responsible adult at least 63 years of age to accompany you and drive you home. This person must remain in the waiting room during your procedure.  Wear loose fitting clothing that is easily removed.  Leave  jewelry and other valuables at home. However, you may wish to bring a book to read or an iPod/MP3 player to listen to music as you wait for your procedure to start.  Remove all body piercing jewelry and leave at home.  Do not wear  red or dark nail polish.  Total time from sign-in until discharge is approximately 2-3 hours.  You should go home directly after your procedure and rest. You can resume normal activities the day after your procedure.  The day  of your procedure you should not:  Drive  Make legal decisions  Operate machinery  Drink alcohol  Return to work  You will receive specific instructions about eating, activities and medications before you leave.   The above instructions have been reviewed and explained to me by   __________________________________________________________   I fully understand and can verbalize the instructions  ___________________________________________________________  Date ________________  Justin QuinYou may call the office [phone] before 5:00 pm and call [phone] after 5:00 pm if you are having any problems with the prep.

## 2016-05-20 NOTE — Progress Notes (Signed)
Subjective:   Justin Goodman is a 61 y.o. male who presents for an Initial Medicare Annual Wellness Visit.  Review of Systems      Objective:    Today's Vitals   05/20/16 0838 05/20/16 0846  BP: 110/64   Pulse: 80   Temp: 97.7 F (36.5 C)   TempSrc: Oral   SpO2: 99%   Weight: 180 lb 6.4 oz (81.8 kg)   Height: _0  (1.753 m)   PainSc:  8    Body mass index is 26.64 kg/m.  Current Medications (verified) Outpatient Encounter Prescriptions as of 05/20/2016  Medication Sig  . ACCU-CHEK FASTCLIX LANCETS MISC Check blood glucose 4 times daily Diagnosis diabetes E11.40  . albuterol (PROVENTIL HFA;VENTOLIN HFA) 108 (90 Base) MCG/ACT inhaler Inhale 2 puffs into the lungs every 6 (six) hours as needed for wheezing or shortness of breath.  . Alcohol Swabs (B-D SINGLE USE SWABS REGULAR) PADS Check blood glucose 4 times daily Diagnosis diabetes E11.40  . AMBULATORY NON FORMULARY MEDICATION Medication Name: Glucometer with strips and lancet to test once a day (box of 100)  Dx 250.00 (diabetes.  Marland Kitchen amitriptyline (ELAVIL) 50 MG tablet Take 1 tablet (50 mg total) by mouth at bedtime.  Marland Kitchen aspirin EC 81 MG tablet Take 1 tablet (81 mg total) by mouth daily.  . Blood Glucose Calibration (ACCU-CHEK SMARTVIEW CONTROL) LIQD Check blood glucose 4 times daily Diagnosis diabetes E11.40  . Blood Glucose Monitoring Suppl (ACCU-CHEK NANO SMARTVIEW) w/Device KIT Check blood glucose 4 times daily Diagnosis diabetes E11.40  . gabapentin (NEURONTIN) 600 MG tablet Take 2 tablets (1,200 mg total) by mouth 3 (three) times daily.  Marland Kitchen glucose blood (ACCU-CHEK SMARTVIEW) test strip Check blood glucose 4 times daily Diagnosis diabetes E11.40  . Insulin Pen Needle (B-D ULTRAFINE III SHORT PEN) 31G X 8 MM MISC 1 Units by Does not apply route daily.  . Insulin Syringe-Needle U-100 30G X 1/2" 1 ML MISC As directed  . losartan (COZAAR) 50 MG tablet Take 1 tablet (50 mg total) by mouth daily.  . meloxicam (MOBIC) 15 MG  tablet TAKE ONE TABLET BY MOUTH ONCE DAILY  . metFORMIN (GLUCOPHAGE) 1000 MG tablet Take 1 tablet (1,000 mg total) by mouth 2 (two) times daily with a meal.  . metoprolol (LOPRESSOR) 50 MG tablet Take 0.5 tablets (25 mg total) by mouth 2 (two) times daily.  Marland Kitchen omeprazole (PRILOSEC) 40 MG capsule Take 1 capsule (40 mg total) by mouth daily.  Nelva Nay SOLOSTAR 300 UNIT/ML SOPN Inject 32 Units into the skin daily.  . traZODone (DESYREL) 150 MG tablet Take 1 tablet (150 mg total) by mouth at bedtime.  . budesonide-formoterol (SYMBICORT) 160-4.5 MCG/ACT inhaler Inhale 2 puffs into the lungs 2 (two) times daily. (Patient not taking: Reported on 05/05/2016)  . TOUJEO SOLOSTAR 300 UNIT/ML SOPN INJECT 60 UNITS SUBCUTANEOUSLY ONCE DAILY (Patient not taking: Reported on 05/20/2016)  . [DISCONTINUED] azithromycin (ZITHROMAX Z-PAK) 250 MG tablet Take 2 tablets (500 mg) on  Day 1,  followed by 1 tablet (250 mg) once daily on Days 2 through 5. (Patient not taking: Reported on 05/20/2016)   No facility-administered encounter medications on file as of 05/20/2016.     Allergies (verified) Amaryl [glimepiride]; Atorvastatin; Glipizide; Losartan; Lyrica [pregabalin]; Quinapril; and Simvastatin   History: Past Medical History:  Diagnosis Date  . Chronic back pain    only mild loss of T12-L1 disk ht on xray 1-12  . COPD (chronic obstructive pulmonary disease) ([place])   .  Diabetes mellitus    w/ neuropathy  . Hypertension   . Smoker    History reviewed. No pertinent surgical history. Family History  Problem Relation Age of Onset  . Diabetes Mother   . Alcohol abuse Father     Liver disease killed father @ age 71.  Marland Kitchen Alcohol abuse Brother   . Heart attack Brother     Died at age 39.   Social History   Occupational History  . Not on file.   Social History Main Topics  . Smoking status: Former Smoker    Packs/day: 1.00    Years: 20.00    Types: Cigarettes    Quit date: 2013  . Smokeless tobacco: Never  Used  . Alcohol use No  . Drug use: No  . Sexual activity: Yes   Tobacco Counseling Counseling given: No   Activities of Daily Living In your present state of health, do you have any difficulty performing the following activities: 05/20/2016  Hearing? N  Vision? N  Difficulty concentrating or making decisions? Y  Walking or climbing stairs? Y  Dressing or bathing? N  Doing errands, shopping? N  Preparing Food and eating ? Y  Using the Toilet? N  In the past six months, have you accidently leaked urine? N  Do you have problems with loss of bowel control? N  Managing your Medications? N  Managing your Finances? N  Housekeeping or managing your Housekeeping? Y  Some recent data might be hidden    Immunizations and Health Maintenance Immunization History  Administered Date(s) Administered  . Influenza,inj,Quad PF,36+ Mos 01/21/2013, 01/10/2014, 12/28/2014, 01/10/2016  . Pneumococcal Polysaccharide-23 08/19/2012   Health Maintenance Due  Topic Date Due  . Hepatitis C Screening  1955/06/01  . HIV Screening  08/28/1968  . ZOSTAVAX  08/28/2013    Patient Care Team: Hali Marry, MD as PCP - General (Family Medicine)  Indicate any recent Medical Services you may have received from other than [facility] providers in the past year (date may be approximate).    Assessment:   This is a routine wellness examination for Justin Goodman.   Hearing/Vision screen No exam data present  Dietary issues and exercise activities discussed: Current Exercise Habits: The patient does not participate in regular exercise at present, Exercise limited by: cardiac condition(s);orthopedic condition(s)  Goals      Patient Stated   . Exercise 3x per week (30 min per time) (pt-stated)          He would like to get back to the gym to work out for at least an hour for 3 times per week.      Depression Screen PHQ 2/9 Scores 05/20/2016  PHQ - 2 Score 0    Fall Risk Fall Risk  05/20/2016  Falls in  the past year? Yes  Number falls in past yr: 2 or more  Injury with Fall? Yes  Risk for fall due to : History of fall(s);Medication side effect  Risk for fall due to (comments): Medication was chnaged by Dr. Madilyn Fireman.  Follow up Education provided    Cognitive Function: Normal cognitive function by 6CIT evaluation and direct interview with pt. Today.     6CIT Screen 05/20/2016  What Year? 0 points  What month? 0 points  What time? 0 points  Count back from 20 0 points  Months in reverse 0 points  Repeat phrase 0 points  Total Score 0    Screening Tests Health Maintenance  Topic Date Due  .  Hepatitis C Screening  06/08/1955  . HIV Screening  08/28/1968  . ZOSTAVAX  08/28/2013  . OPHTHALMOLOGY EXAM  10/13/2016 (Originally 06/11/2014)  . TETANUS/TDAP  10/13/2016 (Originally 04/07/2016)  . HEMOGLOBIN A1C  10/13/2016  . FOOT EXAM  04/15/2017  . PNEUMOCOCCAL POLYSACCHARIDE VACCINE (2) 08/19/2017  . COLONOSCOPY  04/03/2021  . INFLUENZA VACCINE  Addressed        Plan:     I have personally reviewed and addressed the Medicare Annual Wellness questionnaire and have noted the following in the patient's chart:  A. Medical and social history B. Use of alcohol, tobacco or illicit drugs -Does not use any of these. C. Current medications and supplements D. Functional ability and status-Has been having some difficulty climbing stairs due to weak and painful legs and diabetic peripheral neuropathy.  States he uses a cane in the home.  States sometimes his legs are painful and weak to the point he doesn't feel like prepping his meals.  Will send a referral for [facility] Wheels to [facility]. E.  Nutritional status-Concerned he may not be getting the proper nutrition that a diabetic patient may need due to eating foods that are not ideal for a diabetic pt. Due to convenience and little if any, prep required. F.  Physical activity:Limited due to neuropathy and weak and painful legs and shoulders.  Fall  assessment done; 2 falls within the past year.  Discussed preventive measures. G. Advance directives-Discussed.  Has copy but not sure if in chart.  Requested copy to go into chart. H. List of other physicians-Uses "[facility]" on [address].  He is due for a DM eye exam.  Discussed. I.  Hospitalizations, surgeries, and ER visits in previous 12 months J.  Vitals-WNL. K. Screenings to include cognitive, which was normal by direct interview with pt. And 6CIT.   Depression eval done with no evidence of this and pt denied. L. Referrals and appointments - Meal on Wheels.  In addition, I have reviewed and discussed with patient certain preventive protocols, quality metrics, and best practice recommendations. A written personalized care plan for preventive services as well as general preventive health recommendations were provided to patient.  Signed,   Nestor Lewandowsky, RN

## 2016-06-12 ENCOUNTER — Other Ambulatory Visit: Payer: Self-pay | Admitting: Family Medicine

## 2016-07-10 ENCOUNTER — Other Ambulatory Visit: Payer: Self-pay | Admitting: Family Medicine

## 2016-07-14 NOTE — Progress Notes (Signed)
Subjective:    CC: DMs, diabetic neuropathy, hypertension.  HPI:  Diabetes - no hypoglycemic events. No wounds or sores that are not healing well. No increased thirst or urination. Checking glucose at home. Taking medications as prescribed without any side effects. Still exercising regularly. He complains of low energy.  Hypertension- Pt denies chest pain, SOB, dizziness, or heart palpitations.  Taking meds as directed w/o problems.  Denies medication side effects.    DIabetic neuropathy -  He failed Lyrica side effects and is now maxed out on his gabapentin was 600 milligrams 3 times a day. He still has significant pain and discomfort in his feet and legs and is willing to try something different if there something available.   Past medical history, Surgical history, Family history not pertinant except as noted below, Social history, Allergies, and medications have been entered into the medical record, reviewed, and corrections made.   Review of Systems: No fevers, chills, night sweats, weight loss, chest pain, or shortness of breath.   Objective:    General: Well Developed, well nourished, and in no acute distress.  Neuro: Alert and oriented x3, extra-ocular muscles intact, sensation grossly intact.  HEENT: Normocephalic, atraumatic  Skin: Warm and dry, no rashes. Cardiac: Regular rate and rhythm, no murmurs rubs or gallops, no lower extremity edema.  Respiratory: Clear to auscultation bilaterally. Not using accessory muscles, speaking in full sentences.   Impression and Recommendations:   DM- Uncontrolled. Hemoglobin A1c up to 8.8 today. We decided to recheck this to confirm if it was an error and also sent him for venipuncture. He really felt like it made some major changes in his diet. Fasting blood sugars running in the 150s to 170s so he felt like this was higher than it really should be. Will check hemoglobin.   Lab Results  Component Value Date   HGBA1C 7.5 04/15/2016   HTN  - Well controlled. Continue current regimen. Follow up in  3-4 months.   Neuropathy - Discussed options.  Will try duloxetine instead. We'll wean the gabapentin and try duloxetine.

## 2016-07-15 ENCOUNTER — Ambulatory Visit (INDEPENDENT_AMBULATORY_CARE_PROVIDER_SITE_OTHER): Payer: Medicare HMO | Admitting: Family Medicine

## 2016-07-15 VITALS — BP 120/67 | HR 77 | Wt 171.0 lb

## 2016-07-15 DIAGNOSIS — I10 Essential (primary) hypertension: Secondary | ICD-10-CM | POA: Diagnosis not present

## 2016-07-15 DIAGNOSIS — E1142 Type 2 diabetes mellitus with diabetic polyneuropathy: Secondary | ICD-10-CM

## 2016-07-15 LAB — BASIC METABOLIC PANEL WITH GFR
BUN: 13 mg/dL (ref 7–25)
CALCIUM: 9.6 mg/dL (ref 8.6–10.3)
CO2: 24 mmol/L (ref 20–31)
Chloride: 101 mmol/L (ref 98–110)
Creat: 0.95 mg/dL (ref 0.70–1.25)
GFR, Est Non African American: 85 mL/min (ref >=60)
Glucose, Bld: 212 mg/dL — ABNORMAL HIGH (ref 65–99)
Potassium: 4.9 mmol/L (ref 3.5–5.3)
SODIUM: 138 mmol/L (ref 135–146)

## 2016-07-15 LAB — CBC WITH DIFFERENTIAL/PLATELET
BASOS ABS: 0 {cells}/uL (ref 0–200)
Basophils Relative: 0 %
EOS ABS: 62 {cells}/uL (ref 15–500)
Eosinophils Relative: 2 %
HCT: 42.8 % (ref 38.5–50.0)
Hemoglobin: 14.5 g/dL (ref 13.2–17.1)
LYMPHS PCT: 41 %
Lymphs Abs: 1271 cells/uL (ref 850–3900)
MCH: 28.4 pg (ref 27.0–33.0)
MCHC: 33.9 g/dL (ref 32.0–36.0)
MCV: 83.8 fL (ref 80.0–100.0)
MONOS PCT: 12 %
MPV: 9.2 fL (ref 7.5–12.5)
Monocytes Absolute: 372 cells/uL (ref 200–950)
NEUTROS PCT: 45 %
Neutro Abs: 1395 cells/uL — ABNORMAL LOW (ref 1500–7800)
PLATELETS: 162 10*3/uL (ref 140–400)
RBC: 5.11 MIL/uL (ref 4.20–5.80)
RDW: 15 % (ref 11.0–15.0)
WBC: 3.1 10*3/uL — ABNORMAL LOW (ref 3.8–10.8)

## 2016-07-15 LAB — POCT GLYCOSYLATED HEMOGLOBIN (HGB A1C): Hemoglobin A1C: 8.8

## 2016-07-15 MED ORDER — MELOXICAM 15 MG PO TABS: 15.0000 mg | ORAL_TABLET | Freq: Every day | ORAL | 3 refills | Status: DC

## 2016-07-15 MED ORDER — METOPROLOL TARTRATE 50 MG PO TABS: 25.0000 mg | ORAL_TABLET | Freq: Two times a day (BID) | ORAL | 3 refills | Status: DC

## 2016-07-15 MED ORDER — TRAZODONE HCL 150 MG PO TABS: 150.0000 mg | ORAL_TABLET | Freq: Every day | ORAL | 3 refills | Status: DC

## 2016-07-15 MED ORDER — METFORMIN HCL 1000 MG PO TABS: 1000.0000 mg | ORAL_TABLET | Freq: Two times a day (BID) | ORAL | 3 refills | Status: DC

## 2016-07-15 MED ORDER — DULOXETINE HCL 30 MG PO CPEP: 30.0000 mg | ORAL_CAPSULE | Freq: Every day | ORAL | 1 refills | Status: DC

## 2016-07-15 MED ORDER — LOSARTAN POTASSIUM 50 MG PO TABS: 50.0000 mg | ORAL_TABLET | Freq: Every day | ORAL | 3 refills | Status: DC

## 2016-07-15 MED ORDER — AMITRIPTYLINE HCL 50 MG PO TABS: 50.0000 mg | ORAL_TABLET | Freq: Every day | ORAL | 3 refills | Status: DC

## 2016-07-15 MED ORDER — OMEPRAZOLE 40 MG PO CPDR: 40.0000 mg | DELAYED_RELEASE_CAPSULE | Freq: Every day | ORAL | 4 refills | Status: DC

## 2016-07-15 MED ORDER — UMECLIDINIUM BROMIDE 62.5 MCG/INH IN AEPB: 1.0000 | INHALATION_SPRAY | Freq: Every day | RESPIRATORY_TRACT | 11 refills | Status: DC

## 2016-07-15 NOTE — Patient Instructions (Signed)
Decrease gapabetin to once twice a day for a week before starting the cymbalta.  Then drop the gabapentin to one at bedtime for one week and then stop.

## 2016-07-16 LAB — HEMOGLOBIN A1C
HEMOGLOBIN A1C: 8.4 % — AB (ref <5.7)
MEAN PLASMA GLUCOSE: 194 mg/dL

## 2016-07-17 ENCOUNTER — Other Ambulatory Visit: Payer: Self-pay | Admitting: *Deleted

## 2016-07-17 MED ORDER — DULOXETINE HCL 30 MG PO CPEP: 30.0000 mg | ORAL_CAPSULE | Freq: Every day | ORAL | 1 refills | Status: DC

## 2016-07-28 ENCOUNTER — Telehealth: Payer: Self-pay | Admitting: *Deleted

## 2016-07-28 ENCOUNTER — Telehealth: Payer: Self-pay

## 2016-07-28 MED ORDER — DULOXETINE HCL 30 MG PO CPEP: 30.0000 mg | ORAL_CAPSULE | Freq: Every day | ORAL | 1 refills | Status: DC

## 2016-07-28 NOTE — Telephone Encounter (Signed)
Please call patient, he called to provide information about medications.

## 2016-07-28 NOTE — Telephone Encounter (Signed)
I submitted a PA for Cymbalta last week and the response back from the insurance was no PA was needed. I received a notice from the pharm that 2.6 qty/day is the max that insurance will cover. The pharmacy wants to know if perhaps a higher does could be prescribed.

## 2016-07-28 NOTE — Telephone Encounter (Signed)
Spoke w/pt he informed me that his insurance will not cover the amitriptyline. He was given 3 other meds to choose from (lexapro, cymbalta, effexor) and I informed him that according to his medication list the medication that he gave(duloxetine) had already been sent to the pharmacy. Advised him to be sure to read the dosing directions on it and if he has any problems to let us know. Pt voiced understanding and agreed.Loralee Pacas [place]

## 2016-07-28 NOTE — Telephone Encounter (Signed)
OK I sent a new rx for 60 tabs for 1 month supply.. I had already sent a new fax to pharm with this change but evidntly they didn't get it.  How frustrating.

## 2016-07-29 NOTE — Telephone Encounter (Signed)
Patient notified

## 2016-08-06 ENCOUNTER — Telehealth: Payer: Self-pay | Admitting: *Deleted

## 2016-08-06 NOTE — Telephone Encounter (Signed)
Pt called and lvm asking that I call him back.  Returned pt's call and lvm for him to call back.Justin Goodman [place]

## 2016-08-12 ENCOUNTER — Encounter: Payer: Self-pay | Admitting: Family Medicine

## 2016-08-12 ENCOUNTER — Ambulatory Visit (INDEPENDENT_AMBULATORY_CARE_PROVIDER_SITE_OTHER): Payer: Medicare HMO

## 2016-08-12 ENCOUNTER — Ambulatory Visit (INDEPENDENT_AMBULATORY_CARE_PROVIDER_SITE_OTHER): Payer: Medicare HMO | Admitting: Family Medicine

## 2016-08-12 VITALS — BP 110/82 | HR 89 | Temp 98.8°F | Ht 69.0 in | Wt 173.2 lb

## 2016-08-12 DIAGNOSIS — R6889 Other general symptoms and signs: Secondary | ICD-10-CM

## 2016-08-12 DIAGNOSIS — R05 Cough: Secondary | ICD-10-CM

## 2016-08-12 DIAGNOSIS — R5383 Other fatigue: Secondary | ICD-10-CM

## 2016-08-12 DIAGNOSIS — I700 Atherosclerosis of aorta: Secondary | ICD-10-CM

## 2016-08-12 DIAGNOSIS — R059 Cough, unspecified: Secondary | ICD-10-CM

## 2016-08-12 DIAGNOSIS — R448 Other symptoms and signs involving general sensations and perceptions: Secondary | ICD-10-CM

## 2016-08-12 NOTE — Patient Instructions (Signed)
Thank you for coming in today. Get labs and xray today.  We will contact you with results.  We will want to check with Dr Linford ArnoldMetheney about results as well.    Fatigue Fatigue is feeling tired all of the time, a lack of energy, or a lack of motivation. Occasional or mild fatigue is often a normal response to activity or life in general. However, long-lasting (chronic) or extreme fatigue may indicate an underlying medical condition. Follow these instructions at home: Watch your fatigue for any changes. The following actions may help to lessen any discomfort you are feeling:  Talk to your health care provider about how much sleep you need each night. Try to get the required amount every night.  Take medicines only as directed by your health care provider.  Eat a healthy and nutritious diet. Ask your health care provider if you need help changing your diet.  Drink enough fluid to keep your urine clear or pale yellow.  Practice ways of relaxing, such as yoga, meditation, massage therapy, or acupuncture.  Exercise regularly.  Change situations that cause you stress. Try to keep your work and personal routine reasonable.  Do not abuse illegal drugs.  Limit alcohol intake to no more than 1 drink per day for nonpregnant women and 2 drinks per day for men. One drink equals 12 ounces of beer, 5 ounces of wine, or 1 ounces of hard liquor.  Take a multivitamin, if directed by your health care provider. Contact a health care provider if:  Your fatigue does not get better.  You have a fever.  You have unintentional weight loss or gain.  You have headaches.  You have difficulty:  Falling asleep.  Sleeping throughout the night.  You feel angry, guilty, anxious, or sad.  You are unable to have a bowel movement (constipation).  You skin is dry.  Your legs or another part of your body is swollen. Get help right away if:  You feel confused.  Your vision is blurry.  You feel faint  or pass out.  You have a severe headache.  You have severe abdominal, pelvic, or back pain.  You have chest pain, shortness of breath, or an irregular or fast heartbeat.  You are unable to urinate or you urinate less than normal.  You develop abnormal bleeding, such as bleeding from the rectum, vagina, nose, lungs, or nipples.  You vomit blood.  You have thoughts about harming yourself or committing suicide.  You are worried that you might harm someone else. This information is not intended to replace advice given to you by your health care provider. Make sure you discuss any questions you have with your health care provider. Document Released: 01/19/2007 Document Revised: 08/30/2015 Document Reviewed: 07/26/2013 Elsevier Interactive Patient Education  2017 [employer]

## 2016-08-12 NOTE — Progress Notes (Signed)
Justin Goodman is a 61 y.o. male who presents to [facility] today for fatigue.  He has been seeing Dr Madilyn Fireman for his chronic conditions including DM, HTN, chronic back pain, COPD. Patient is not a good historian but thinks he noticed an increase in his fatigue 3 weeks ago and feels that it has worsened.  He started feeling cold all the time, experiencing a dry cough and nausea, endorses muscle and aches.  He denies fevers, SOB, chest pain, night sweats, unintentional weight loss, recent tick bite.     Past Medical History:  Diagnosis Date  . Chronic back pain    only mild loss of T12-L1 disk ht on xray 1-12  . COPD (chronic obstructive pulmonary disease) ([place])   . Diabetes mellitus    w/ neuropathy  . Hypertension   . Smoker    No past surgical history on file. Social History  Substance Use Topics  . Smoking status: Former Smoker    Packs/day: 1.00    Years: 20.00    Types: Cigarettes    Quit date: 2013  . Smokeless tobacco: Never Used  . Alcohol use No   family history includes Alcohol abuse in his brother and father; Diabetes in his mother; Heart attack in his brother.  ROS as above:  Medications: Current Outpatient Prescriptions  Medication Sig Dispense Refill  . ACCU-CHEK FASTCLIX LANCETS MISC Check blood glucose 4 times daily Diagnosis diabetes E11.40 600 each 3  . Alcohol Swabs (B-D SINGLE USE SWABS REGULAR) PADS Check blood glucose 4 times daily Diagnosis diabetes E11.40 600 each 3  . AMBULATORY NON FORMULARY MEDICATION Medication Name: Glucometer with strips and lancet to test once a day (box of 100)  Dx 250.00 (diabetes. 1 Units 3  . amitriptyline (ELAVIL) 50 MG tablet Take 1 tablet (50 mg total) by mouth at bedtime. 90 tablet 3  . aspirin EC 81 MG tablet Take 1 tablet (81 mg total) by mouth daily. 100 tablet 1  . B-D ULTRAFINE III SHORT PEN  31G X 8 MM MISC USE FOR DAILY INSULIN INJECTION AS DIRECTED BY PHYSICIAN 100 each 4  . Blood Glucose Calibration (ACCU-CHEK SMARTVIEW CONTROL) LIQD Check blood glucose 4 times daily Diagnosis diabetes E11.40 3 each 3  . Blood Glucose Monitoring Suppl (ACCU-CHEK NANO SMARTVIEW) w/Device KIT Check blood glucose 4 times daily Diagnosis diabetes E11.40 1 kit 0  . DULoxetine (CYMBALTA) 30 MG capsule Take 1 capsule (30 mg total) by mouth daily. After 1 week, increase to 2 tabs po QD. 60 capsule 1  . glucose blood (ACCU-CHEK SMARTVIEW) test strip Check blood glucose 4 times daily Diagnosis diabetes E11.40 400 each prn  . losartan (COZAAR) 50 MG tablet Take 1 tablet (50 mg total) by mouth daily. 90 tablet 3  . meloxicam (MOBIC) 15 MG tablet Take 1 tablet (15 mg total) by mouth daily. 90 tablet 3  . metFORMIN (GLUCOPHAGE) 1000 MG tablet Take 1 tablet (1,000 mg total) by mouth 2 (two) times daily with a meal. 180 tablet 3  . metoprolol (LOPRESSOR) 50 MG tablet Take 0.5 tablets (25 mg total) by mouth 2 (two) times daily. 180 tablet 3  . omeprazole (PRILOSEC) 40 MG capsule Take 1 capsule (40 mg total) by mouth daily. 90 capsule 4  . TOUJEO SOLOSTAR 300 UNIT/ML SOPN Inject 32 Units into the skin daily. 2 pen 6  . traZODone (DESYREL) 150 MG tablet Take 1 tablet (150 mg total)  by mouth at bedtime. 90 tablet 3  . umeclidinium bromide (INCRUSE ELLIPTA) 62.5 MCG/INH AEPB Inhale 1 puff into the lungs daily. 1 each 11  . albuterol (PROVENTIL HFA;VENTOLIN HFA) 108 (90 Base) MCG/ACT inhaler Inhale 2 puffs into the lungs every 6 (six) hours as needed for wheezing or shortness of breath. (Patient not taking: Reported on 08/12/2016) 1 Inhaler 3   No current facility-administered medications for this visit.    Allergies  Allergen Reactions  . Amaryl [Glimepiride] Other (See Comments)    Frequent hypoglycemia  . Atorvastatin Other (See Comments)    HA   . Glipizide Other (See Comments)    HA  . Losartan Other (See  Comments)    Vision changes, HA.   Marland Kitchen Lyrica [Pregabalin] Other (See Comments)    Sedation  . Quinapril Cough  . Simvastatin Other (See Comments)    Severe myalgias.      Health Maintenance Health Maintenance  Topic Date Due  . HIV Screening  08/28/1968  . OPHTHALMOLOGY EXAM  10/13/2016 (Originally 06/11/2014)  . TETANUS/TDAP  10/13/2016 (Originally 04/07/2016)  . Hepatitis C Screening  05/20/2017 (Originally Apr 09, 1955)  . INFLUENZA VACCINE  11/05/2016  . HEMOGLOBIN A1C  01/14/2017  . FOOT EXAM  04/15/2017  . PNEUMOCOCCAL POLYSACCHARIDE VACCINE (2) 08/19/2017  . COLONOSCOPY  04/03/2021     Exam:  BP 110/82   Pulse 89   Temp 98.8 F (37.1 C)   Ht 5' 9"  (1.753 m)   Wt 173 lb 3.2 oz (78.6 kg)   SpO2 99%   BMI 25.58 kg/m  Gen: Well NAD HEENT: EOMI,  MMM Normal posterior pharynx. No cervical lymphadenopathy. Lungs: Normal work of breathing. CTABL Heart: RRR no MRG Abd: NABS, Soft. Nondistended, Nontender Exts: Brisk capillary refill, warm and well perfused.    No results found for this or any previous visit (from the past 72 hour(s)). No results found.    Assessment and Plan: 61 y.o. male with fatigue  Infectious vs chronic lung path vs malignancy evaluate via CXR CBC w diff, CMP. anemia likely to be contributing to fatigue Thyroid labs, ESR to check other possible etiologies. He is on metoprolol and amitriptyline which may be contributing to his fatigue as well Follow-up with Dr Madilyn Fireman    Orders Placed This Encounter  Procedures  . DG Chest 2 View    Order Specific Question:   Reason for exam:    Answer:   Cough, assess intra-thoracic pathology    Order Specific Question:   Preferred imaging location?    Answer:   [facility]  . CBC with Differential/Platelet  . COMPLETE METABOLIC PANEL WITH GFR  . TSH  . T4, free  . T3, free  . Sedimentation rate   No orders of the defined types were placed in this encounter.    Discussed warning signs  or symptoms. Please see discharge instructions. Patient expresses understanding.

## 2016-08-13 ENCOUNTER — Telehealth: Payer: Self-pay | Admitting: *Deleted

## 2016-08-13 ENCOUNTER — Encounter: Payer: Self-pay | Admitting: Family Medicine

## 2016-08-13 DIAGNOSIS — I700 Atherosclerosis of aorta: Secondary | ICD-10-CM | POA: Insufficient documentation

## 2016-08-13 LAB — COMPLETE METABOLIC PANEL WITH GFR
ALT: 12 U/L (ref 9–46)
AST: 17 U/L (ref 10–35)
Albumin: 4.5 g/dL (ref 3.6–5.1)
Alkaline Phosphatase: 51 U/L (ref 40–115)
BUN: 14 mg/dL (ref 7–25)
CHLORIDE: 98 mmol/L (ref 98–110)
CO2: 26 mmol/L (ref 20–31)
CREATININE: 0.87 mg/dL (ref 0.70–1.25)
Calcium: 9.6 mg/dL (ref 8.6–10.3)
GFR, Est Non African American: >89 mL/min (ref >=60)
GLUCOSE: 187 mg/dL — AB (ref 65–99)
Potassium: 4.9 mmol/L (ref 3.5–5.3)
SODIUM: 134 mmol/L — AB (ref 135–146)
Total Bilirubin: 0.6 mg/dL (ref 0.2–1.2)
Total Protein: 7.4 g/dL (ref 6.1–8.1)

## 2016-08-13 LAB — CBC WITH DIFFERENTIAL/PLATELET
BASOS PCT: 0 %
Basophils Absolute: 0 cells/uL (ref 0–200)
EOS ABS: 42 {cells}/uL (ref 15–500)
Eosinophils Relative: 1 %
HCT: 43.6 % (ref 38.5–50.0)
Hemoglobin: 14.3 g/dL (ref 13.2–17.1)
Lymphocytes Relative: 42 %
Lymphs Abs: 1764 cells/uL (ref 850–3900)
MCH: 27.9 pg (ref 27.0–33.0)
MCHC: 32.8 g/dL (ref 32.0–36.0)
MCV: 85 fL (ref 80.0–100.0)
MONOS PCT: 9 %
MPV: 9.7 fL (ref 7.5–12.5)
Monocytes Absolute: 378 cells/uL (ref 200–950)
NEUTROS ABS: 2016 {cells}/uL (ref 1500–7800)
Neutrophils Relative %: 48 %
PLATELETS: 198 10*3/uL (ref 140–400)
RBC: 5.13 MIL/uL (ref 4.20–5.80)
RDW: 15.1 % — AB (ref 11.0–15.0)
WBC: 4.2 10*3/uL (ref 3.8–10.8)

## 2016-08-13 LAB — T3, FREE: T3, Free: 3 pg/mL (ref 2.3–4.2)

## 2016-08-13 LAB — T4, FREE: Free T4: 1.2 ng/dL (ref 0.8–1.8)

## 2016-08-13 LAB — FECAL OCCULT BLOOD, GUAIAC: Fecal Occult Blood: NEGATIVE

## 2016-08-13 LAB — TSH: TSH: 2.11 mIU/L (ref 0.40–4.50)

## 2016-08-13 LAB — SEDIMENTATION RATE: Sed Rate: 5 mm/hr (ref 0–20)

## 2016-08-13 NOTE — Telephone Encounter (Signed)
Called patient to let him know about labs done with Dr. Denyse Amassorey. Advice was to increase insulin. Patient didn't want to increase the insulin because he states in the past his blood sugar went down to 50. He wanted to discuss it with Dr. Linford ArnoldMetheney. Tried to transfer him to scheduling but he said " they always tell me she is busy." Metheney had a 930 on thurs so scheduled him for 930 08-14-16.

## 2016-08-14 ENCOUNTER — Encounter: Payer: Self-pay | Admitting: Family Medicine

## 2016-08-14 ENCOUNTER — Ambulatory Visit (INDEPENDENT_AMBULATORY_CARE_PROVIDER_SITE_OTHER): Payer: Medicare HMO | Admitting: Family Medicine

## 2016-08-14 ENCOUNTER — Telehealth: Payer: Self-pay | Admitting: Family Medicine

## 2016-08-14 VITALS — BP 118/64 | HR 78 | Ht 69.0 in | Wt 169.0 lb

## 2016-08-14 DIAGNOSIS — R5383 Other fatigue: Secondary | ICD-10-CM

## 2016-08-14 DIAGNOSIS — R634 Abnormal weight loss: Secondary | ICD-10-CM | POA: Diagnosis not present

## 2016-08-14 DIAGNOSIS — E1142 Type 2 diabetes mellitus with diabetic polyneuropathy: Secondary | ICD-10-CM | POA: Diagnosis not present

## 2016-08-14 DIAGNOSIS — R6889 Other general symptoms and signs: Secondary | ICD-10-CM | POA: Diagnosis not present

## 2016-08-14 DIAGNOSIS — R109 Unspecified abdominal pain: Secondary | ICD-10-CM | POA: Diagnosis not present

## 2016-08-14 DIAGNOSIS — Z125 Encounter for screening for malignant neoplasm of prostate: Secondary | ICD-10-CM | POA: Diagnosis not present

## 2016-08-14 LAB — PSA: PSA: 0.3 ng/mL (ref <=4.0)

## 2016-08-14 MED ORDER — AZITHROMYCIN 250 MG PO TABS: ORAL_TABLET | ORAL | 0 refills | Status: DC

## 2016-08-14 MED ORDER — AZITHROMYCIN 250 MG PO TABS: ORAL_TABLET | ORAL | 0 refills | Status: AC

## 2016-08-14 NOTE — Telephone Encounter (Signed)
Okay. [facility] the [facility] [address] here in [place]. Since he feels the Cymbalta is not helping he can wean the medication. He should be taking 2 tabs daily. Encouraged him to go down to 1 tab daily and then after 5 days can stop the medication completely. I want him to try acetyl l-carnitine 1000 mg 3 times a day. This is usually in the supplement/vitamin section. There are a few trial showing that this can be helpful for peripheral neuropathy.

## 2016-08-14 NOTE — Telephone Encounter (Signed)
Called patient with the information regarding his BG, and keeping a log.  I let him know about his echocardiogram, and they would be calling to set this up with him.  He said that his cymbalta is not working for him.  Also, it looks like [facility] [place].  [facility].

## 2016-08-14 NOTE — Progress Notes (Signed)
Subjective:    Patient ID: Justin Goodman, male    DOB: 1955/05/20, 61 y.o.   MRN: [id number]  HPI   DM- pt reports that since his last OV 07/15/16 his BS have been running over 200. he said that this morning it was 281 before breakfast. Says he has been really watching his food intake.  Not sure why it is high. He says he ate no sugar for almost 3 months and expect that his A1c to be fantastic and when he came in in April it was actually elevated at 8.4. He had not changed any of his medications or regimen. Seems like his sugar drops lower I f he eats sugar.   Feels extremely tired and cold. He's been so fatigued he hasn't been able to get to the gym and exercise.He continues to have a dry cough and some nausea as well as some muscle aches. He had also been experiencing some left sided abdominal pain but says it's actually been better over the last 3 days.  Review of Systems  No fever, chills.   BP 118/64   Pulse 78   Ht 5' 9"  (1.753 m)   Wt 169 lb (76.7 kg)   SpO2 99%   BMI 24.96 kg/m     Allergies  Allergen Reactions  . Amaryl [Glimepiride] Other (See Comments)    Frequent hypoglycemia  . Atorvastatin Other (See Comments)    HA   . Glipizide Other (See Comments)    HA  . Losartan Other (See Comments)    Vision changes, HA.   Marland Kitchen Lyrica [Pregabalin] Other (See Comments)    Sedation  . Quinapril Cough  . Simvastatin Other (See Comments)    Severe myalgias.      Past Medical History:  Diagnosis Date  . Chronic back pain    only mild loss of T12-L1 disk ht on xray 1-12  . COPD (chronic obstructive pulmonary disease) ([place])   . Diabetes mellitus    w/ neuropathy  . Hypertension   . Smoker     No past surgical history on file.  Social History   Social History  . Marital status: Divorced    Spouse name: N/A  . Number of children: N/A  . Years of education: N/A   Occupational History  . Not on file.   Social History Main Topics  . Smoking status: Former Smoker     Packs/day: 1.00    Years: 20.00    Types: Cigarettes    Quit date: 2013  . Smokeless tobacco: Never Used  . Alcohol use No  . Drug use: No  . Sexual activity: Yes   Other Topics Concern  . Not on file   Social History Narrative  . No narrative on file    Family History  Problem Relation Age of Onset  . Diabetes Mother   . Alcohol abuse Father        Liver disease killed father @ age 99.  Marland Kitchen Alcohol abuse Brother   . Heart attack Brother        Died at age 70.    Outpatient Encounter Prescriptions as of 08/14/2016  Medication Sig  . ACCU-CHEK FASTCLIX LANCETS MISC Check blood glucose 4 times daily Diagnosis diabetes E11.40  . albuterol (PROVENTIL HFA;VENTOLIN HFA) 108 (90 Base) MCG/ACT inhaler Inhale 2 puffs into the lungs every 6 (six) hours as needed for wheezing or shortness of breath.  . Alcohol Swabs (B-D SINGLE USE SWABS REGULAR) PADS Check blood  glucose 4 times daily Diagnosis diabetes E11.40  . AMBULATORY NON FORMULARY MEDICATION Medication Name: Glucometer with strips and lancet to test once a day (box of 100)  Dx 250.00 (diabetes.  Marland Kitchen amitriptyline (ELAVIL) 50 MG tablet Take 1 tablet (50 mg total) by mouth at bedtime.  Marland Kitchen aspirin EC 81 MG tablet Take 1 tablet (81 mg total) by mouth daily.  . B-D ULTRAFINE III SHORT PEN 31G X 8 MM MISC USE FOR DAILY INSULIN INJECTION AS DIRECTED BY PHYSICIAN  . Blood Glucose Calibration (ACCU-CHEK SMARTVIEW CONTROL) LIQD Check blood glucose 4 times daily Diagnosis diabetes E11.40  . Blood Glucose Monitoring Suppl (ACCU-CHEK NANO SMARTVIEW) w/Device KIT Check blood glucose 4 times daily Diagnosis diabetes E11.40  . DULoxetine (CYMBALTA) 30 MG capsule Take 1 capsule (30 mg total) by mouth daily. After 1 week, increase to 2 tabs po QD.  Marland Kitchen glucose blood (ACCU-CHEK SMARTVIEW) test strip Check blood glucose 4 times daily Diagnosis diabetes E11.40  . losartan (COZAAR) 50 MG tablet Take 1 tablet (50 mg total) by mouth daily.  . meloxicam  (MOBIC) 15 MG tablet Take 1 tablet (15 mg total) by mouth daily.  . metFORMIN (GLUCOPHAGE) 1000 MG tablet Take 1 tablet (1,000 mg total) by mouth 2 (two) times daily with a meal.  . metoprolol (LOPRESSOR) 50 MG tablet Take 0.5 tablets (25 mg total) by mouth 2 (two) times daily.  Marland Kitchen omeprazole (PRILOSEC) 40 MG capsule Take 1 capsule (40 mg total) by mouth daily.  Nelva Nay SOLOSTAR 300 UNIT/ML SOPN Inject 32 Units into the skin daily.  . traZODone (DESYREL) 150 MG tablet Take 1 tablet (150 mg total) by mouth at bedtime.  Marland Kitchen umeclidinium bromide (INCRUSE ELLIPTA) 62.5 MCG/INH AEPB Inhale 1 puff into the lungs daily.  Marland Kitchen azithromycin (ZITHROMAX) 250 MG tablet 2 Ttabs PO on Day 1, then one a day x 4 days.   No facility-administered encounter medications on file as of 08/14/2016.          Objective:   Physical Exam  Constitutional: He is oriented to person, place, and time. He appears well-developed and well-nourished.  HENT:  Head: Normocephalic and atraumatic.  Neck: Neck supple. No thyromegaly present.  Cardiovascular: Normal rate, regular rhythm and normal heart sounds.   Pulmonary/Chest: Effort normal and breath sounds normal.  Abdominal: Soft. Bowel sounds are normal. He exhibits no distension and no mass. There is no tenderness. There is no rebound and no guarding.  Neurological: He is alert and oriented to person, place, and time.  Skin: Skin is warm and dry.  Psychiatric: He has a normal mood and affect. His behavior is normal.       Assessment & Plan:  FAtigue - Unclear etiology. I am very concerned that he said fatigue is not able to work out. Normally he goes to the gym almost every day and is very healthy areas I am concerned about this.  He has lost some weight since February, approximately 11 pounds.   Diabetes - uncontrolled.  And he's been eating very healthy and was previously exercising regularly some not sure why his A1c has suddenly become uncontrolled.  URI - NOt  improving. Will tx with zpack.    Abnormal weight loss-unclear etiology. Thyroid was normal. Lites are normal. CBC was normal. Will check a PSA.  Left-sided abdominal pain-even notes been better the last couple days with his symptoms I'm concerned about the potential for tumor mass etc. We'll go ahead and get a CT of abdomen  and pelvis for further evaluation.

## 2016-08-14 NOTE — Telephone Encounter (Signed)
Pt notified of script, and supplement.  Pt wanted you to know that his BG was 88 just now, and he is getting ready to eat dinner.  I explained again, that you want him to keep a log for a week, then bring it in.

## 2016-08-14 NOTE — Telephone Encounter (Signed)
Please call patient have him start checking his blood glucose 3 times a day. Once in the morning before breakfast, once before lunch, and once before evening meal. I like him to bring in a log of those sugars in about a week so that I can take a look at them and try to figure out what is going on with him. I did order the echocardiogram of his heart so should be contacting him soon. I was unable to find any other stress testing that was done on his heart so we may end up ordering that as well but we will start with the echocardiogram.

## 2016-08-15 ENCOUNTER — Other Ambulatory Visit: Payer: Self-pay | Admitting: *Deleted

## 2016-08-15 LAB — LIPASE: Lipase: 18 U/L (ref 7–60)

## 2016-08-15 LAB — AMYLASE: Amylase: 49 U/L (ref 0–105)

## 2016-08-19 ENCOUNTER — Telehealth: Payer: Self-pay | Admitting: *Deleted

## 2016-08-19 NOTE — Telephone Encounter (Signed)
Pt Justin Goodman stating that his copay is $2300. Marland Kitchen.Laureen Ochs.Galileah Piggee, Viann Shoveonya [name]

## 2016-08-19 NOTE — Telephone Encounter (Signed)
Called pt and lvm informing him that Dr. Linford ArnoldMetheney advised that he have the Echo done.Loralee PacasBarkley, Artemis Loyal FlorenceLynetta

## 2016-08-26 ENCOUNTER — Telehealth: Payer: Self-pay

## 2016-08-26 MED ORDER — GABAPENTIN 600 MG PO TABS: 600.0000 mg | ORAL_TABLET | Freq: Three times a day (TID) | ORAL | 3 refills | Status: DC

## 2016-08-26 NOTE — Telephone Encounter (Signed)
Justin Goodman complains of pain in his legs and feet. He has stopped the Cymbalta and the pain is now worse. He states the only medication that helped him was gabapentin 300 mg 2 tablets TID. He stopped the medication because his insurance would not pay for the cost. I advised him with a Good Rx coupon the gabapentin # 180 would cost $17.79 dollars at [facility]. He would like a prescription sent to [facility] in [place]. Please advise.

## 2016-08-26 NOTE — Telephone Encounter (Signed)
Justin Goodman. New perception sent for gabapentin. I went ahead and bumps into the 600 mg that when he doesn't have to take 2 and he can still take it 3 times a day. Total of 90 per month. I did send over a 90 day supply today.

## 2016-08-27 ENCOUNTER — Ambulatory Visit (HOSPITAL_BASED_OUTPATIENT_CLINIC_OR_DEPARTMENT_OTHER)
Admission: RE | Admit: 2016-08-27 | Discharge: 2016-08-27 | Disposition: A | Discharge status: Home / Self Care | Payer: Medicare HMO | Source: Ambulatory Visit | Attending: Family Medicine | Admitting: Family Medicine

## 2016-08-27 DIAGNOSIS — R6889 Other general symptoms and signs: Secondary | ICD-10-CM | POA: Diagnosis not present

## 2016-08-27 DIAGNOSIS — R5383 Other fatigue: Secondary | ICD-10-CM

## 2016-08-27 DIAGNOSIS — R109 Unspecified abdominal pain: Secondary | ICD-10-CM | POA: Diagnosis not present

## 2016-08-27 MED ORDER — GABAPENTIN 100 MG PO CAPS: ORAL_CAPSULE | ORAL | 1 refills | Status: DC

## 2016-08-27 NOTE — Telephone Encounter (Signed)
Yes, will need to taper up.  New rx sent.

## 2016-08-27 NOTE — Progress Notes (Signed)
  Echocardiogram 2D Echocardiogram has been performed.  Nolon RodBrown, Tony 08/27/2016, 1:36 PM

## 2016-08-27 NOTE — Telephone Encounter (Signed)
Patient advised to come by and pick up the coupon card for the gabapentin and that the prescription was sent to [facility].   Can he start taking the 600 mg TID or does he need to titrate up. He has been off of the medication for 4 weeks. Please advise.

## 2016-08-28 NOTE — Telephone Encounter (Signed)
Left a message advising of recommendations and that there is also a coupon for the 100 mg.

## 2016-08-29 NOTE — Telephone Encounter (Signed)
Patient advised of recommendations.  

## 2016-09-05 ENCOUNTER — Encounter: Payer: Self-pay | Admitting: Family Medicine

## 2016-10-07 ENCOUNTER — Encounter: Payer: Self-pay | Admitting: Family Medicine

## 2016-10-07 ENCOUNTER — Ambulatory Visit (INDEPENDENT_AMBULATORY_CARE_PROVIDER_SITE_OTHER): Payer: Medicare HMO | Admitting: Family Medicine

## 2016-10-07 VITALS — BP 94/51 | HR 68 | Resp 16 | Wt 170.0 lb

## 2016-10-07 DIAGNOSIS — E1142 Type 2 diabetes mellitus with diabetic polyneuropathy: Secondary | ICD-10-CM

## 2016-10-07 DIAGNOSIS — I10 Essential (primary) hypertension: Secondary | ICD-10-CM

## 2016-10-07 DIAGNOSIS — R42 Dizziness and giddiness: Secondary | ICD-10-CM

## 2016-10-07 DIAGNOSIS — I952 Hypotension due to drugs: Secondary | ICD-10-CM | POA: Diagnosis not present

## 2016-10-07 LAB — POCT GLYCOSYLATED HEMOGLOBIN (HGB A1C): Hemoglobin A1C: 7.4

## 2016-10-07 MED ORDER — GABAPENTIN 600 MG PO TABS: 1200.0000 mg | ORAL_TABLET | Freq: Three times a day (TID) | ORAL | 3 refills | Status: DC

## 2016-10-07 NOTE — Progress Notes (Signed)
Subjective:    CC: HTN, DM  HPI:  Hypertension- Pt denies chest pain, SOB, dizziness, or heart palpitations.  Taking meds as directed w/o problems.  Denies medication side effects.    Diabetes - no hypoglycemic events. No wounds or sores that are not healing well. No increased thirst or urination. Checking glucose at home. Taking medications as prescribed without any side effects. Seven-day average is 197, 14 day is 176, 30 day is 182, 90 day average is 157.  Lab Results  Component Value Date   HGBA1C 7.4 10/07/2016   Peripheral neuropathy-he recently came off of the Cymbalta because it was no longer going to be covered by his insurance.Marland Kitchen. He wanted to switch back to gabapentin which she had taken previously. We restarted this about 6 weeks ago. He is up to 1200 mg 3 times a day on the gabapentin and says that is actually helping his pain. It does make him a little bit sleepy but overall he feels like the benefit outweighs the negative.  He has been feeling a little bit more dizzy lately. It's usually just with activity not with rest. He says it'll last for about 30 seconds and then go away. He denies any chest pain or palpitations. Recent echocardiogram was normal.  Past medical history, Surgical history, Family history not pertinant except as noted below, Social history, Allergies, and medications have been entered into the medical record, reviewed, and corrections made.   Review of Systems: No fevers, chills, night sweats, weight loss, chest pain, or shortness of breath.   Objective:    General: Well Developed, well nourished, and in no acute distress.  Neuro: Alert and oriented x3, extra-ocular muscles intact, sensation grossly intact.  HEENT: Normocephalic, atraumatic  Skin: Warm and dry, no rashes. Cardiac: Regular rate and rhythm, no murmurs rubs or gallops, no lower extremity edema.  Respiratory: Clear to auscultation bilaterally. Not using accessory muscles, speaking in full  sentences.   Impression and Recommendations:    HTN  - BP is low today.  We'll have him cut his losartan and his metoprolol in half. I think this could be contributing to some of this is dizziness he's been experiencing.  Peripheral neuropathy - he is up to 1200 mg 3 times a day on the gabapentin and says he is finally getting relief for his neuropathy. Will update his prescription and send a new 90 day supply to [facility].  DM- much improved.  A1c is down to 7.4. Much improved. Great work. Increase Toujeo to 34 units. F/U in 3 months.   Dizziness-I suspect this is actually from low blood pressure some to have him cut his  losartan and his metoprolol in half. Recheck BP with nurse visit in 2-3 weeks.

## 2016-10-07 NOTE — Patient Instructions (Signed)
Reduce the losartan. Take half of a tab once daily. Reduce the metoprolol. Take half of a tab twice a day. Come in in about 2-3 weeks for a nurse blood pressure check. Increase Toujeo to 34 units daily.

## 2016-10-28 ENCOUNTER — Ambulatory Visit: Payer: Medicare HMO

## 2016-11-06 ENCOUNTER — Ambulatory Visit (INDEPENDENT_AMBULATORY_CARE_PROVIDER_SITE_OTHER): Payer: Medicare HMO | Admitting: Osteopathic Medicine

## 2016-11-06 ENCOUNTER — Telehealth: Payer: Self-pay | Admitting: *Deleted

## 2016-11-06 VITALS — BP 134/72 | HR 90 | Wt 174.0 lb

## 2016-11-06 DIAGNOSIS — I10 Essential (primary) hypertension: Secondary | ICD-10-CM

## 2016-11-06 NOTE — Progress Notes (Signed)
Pt denies chest pain, SOB, dizziness, or heart palpitations.  Taking meds as directed w/o problems.  Denies medication side effects.  5 min spent with pt.  Patient is cutting losartan and metoprolol in half.   Of note patient also mentions that he is taking one cymbalta 30 mg and  and one gabapentin 600mg  together for his neuropathy and he states it has helped the pain significantly. He started this regimen on his own.

## 2016-11-06 NOTE — Telephone Encounter (Signed)
Message left on vm 

## 2016-11-06 NOTE — Progress Notes (Signed)
Cymbalta and gabapentin together is okay Blood pressure looks fine, continue current medication regimen. As long as feeling well, okay to follow-up as directed in 3 months with PCP  BP 134/72   Pulse 90   Wt 174 lb (78.9 kg)   BMI 25.70 kg/m

## 2017-01-05 NOTE — Telephone Encounter (Signed)
Issue resolved.

## 2017-01-08 ENCOUNTER — Ambulatory Visit (INDEPENDENT_AMBULATORY_CARE_PROVIDER_SITE_OTHER): Payer: Medicare HMO | Admitting: Family Medicine

## 2017-01-08 ENCOUNTER — Encounter: Payer: Self-pay | Admitting: Family Medicine

## 2017-01-08 VITALS — BP 115/60 | HR 81 | Ht 69.0 in | Wt 174.0 lb

## 2017-01-08 DIAGNOSIS — H6123 Impacted cerumen, bilateral: Secondary | ICD-10-CM

## 2017-01-08 DIAGNOSIS — Z23 Encounter for immunization: Secondary | ICD-10-CM

## 2017-01-08 DIAGNOSIS — E1142 Type 2 diabetes mellitus with diabetic polyneuropathy: Secondary | ICD-10-CM | POA: Diagnosis not present

## 2017-01-08 LAB — POCT GLYCOSYLATED HEMOGLOBIN (HGB A1C): Hemoglobin A1C: 8.5

## 2017-01-08 MED ORDER — TRAZODONE HCL 150 MG PO TABS: 150.0000 mg | ORAL_TABLET | Freq: Every day | ORAL | 3 refills | Status: DC

## 2017-01-08 MED ORDER — METOPROLOL TARTRATE 50 MG PO TABS: 25.0000 mg | ORAL_TABLET | Freq: Two times a day (BID) | ORAL | 3 refills | Status: DC

## 2017-01-08 MED ORDER — VENLAFAXINE HCL ER 37.5 MG PO CP24: 37.5000 mg | ORAL_CAPSULE | Freq: Every day | ORAL | 2 refills | Status: DC

## 2017-01-08 NOTE — Patient Instructions (Addendum)
Increased Toujeo to 38 units. Call if having low blood sugars.

## 2017-01-08 NOTE — Progress Notes (Addendum)
Subjective:    CC: DM, neuropathy   HPI:  Diabetes - no hypoglycemic events. No wounds or sores that are not healing well. No increased thirst or urination. Checking glucose at home. Taking medications as prescribed without any side effects.  Peripheral neuropathy-he feels like the gabepentin is working well but he Cymbalta is keeping hi awake at night. He says that when he takes it in the morning it makes him dizzy when he takes the medication at night it actually keeps him very stimulated and awake. He wants to see if there might be something similar that he could try because it does provide significant relief for his neuropathy.   He had bilat cerumen impaction amd would like his ears irrigated.  He can't hear well bc of it.   COPD-he has noticed a little bit more wheezing over the last 2 days. He's currently on increase. He said he did use his albuterol yesterday. He denies any cough or sputum production.  Past medical history, Surgical history, Family history not pertinant except as noted below, Social history, Allergies, and medications have been entered into the medical record, reviewed, and corrections made.   Review of Systems: No fevers, chills, night sweats, weight loss, chest pain, or shortness of breath.   Objective:    General: Well Developed, well nourished, and in no acute distress.  Neuro: Alert and oriented x3, extra-ocular muscles intact, sensation grossly intact.  HEENT: Normocephalic, atraumatic, Oropharynx clear, TMs and canals are blocked by cerumen.  Skin: Warm and dry, no rashes. Cardiac: Regular rate and rhythm, no murmurs rubs or gallops, no lower extremity edema.  Respiratory: Clear to auscultation bilaterally. Not using accessory muscles, speaking in full sentences.   Impression and Recommendations:    DM- uncontrolled. A1c elevated to 8.5 today.  Increased Toujeo to 38 units. He is still exercising and eating healthy.  Diabetic peripheral  neuropathy-continue with gabapentin and we'll try changing the Cymbalta to Effexor.  These medications are close cousins and that we could try switching it to see if it might provide some relief. It doesn't technically have FDA approval for peripheral neuropathy but it isn't as NRI which could be helpful.  Bilateral cerumen impaction-irrigation performed today.  COPD - okay to continue to use albuterol as needed. If he feels like he is getting worse or starts to develop a cough them please let me know. Will treat as COPD exacerbation his symptoms are worsening.  Indication: Cerumen impaction of the ear(s) Medical necessity statement: On physical examination, cerumen impairs clinically significant portions of the external auditory canal, and tympanic membrane. Noted obstructive, copious cerumen  Consent: Discussed benefits and risks of procedure and verbal consent obtained Procedure: Patient was prepped for the procedure. Utilized an otoscope to assess and take note of the ear canal, the tympanic membrane, and the presence, amount, and placement of the cerumen. Gentle water irrigation and soft plastic curette was utilized to remove cerumen.  Post procedure examination: shows cerumen was completely removed. Patient tolerated procedure well. The patient is made aware that they may experience temporary vertigo, temporary hearing loss, and temporary discomfort. If these symptom last for more than 24 hours to call the clinic or proceed to the ED.

## 2017-01-20 ENCOUNTER — Telehealth: Payer: Self-pay | Admitting: *Deleted

## 2017-01-20 NOTE — Telephone Encounter (Signed)
Pre Authorization sent to cover my meds. [id number]

## 2017-01-27 ENCOUNTER — Encounter: Payer: Self-pay | Admitting: Family Medicine

## 2017-01-27 ENCOUNTER — Ambulatory Visit (INDEPENDENT_AMBULATORY_CARE_PROVIDER_SITE_OTHER): Payer: Medicare HMO

## 2017-01-27 ENCOUNTER — Ambulatory Visit (INDEPENDENT_AMBULATORY_CARE_PROVIDER_SITE_OTHER): Payer: Medicare HMO | Admitting: Family Medicine

## 2017-01-27 VITALS — BP 130/63 | HR 84 | Ht 69.0 in | Wt 170.0 lb

## 2017-01-27 DIAGNOSIS — M48061 Spinal stenosis, lumbar region without neurogenic claudication: Secondary | ICD-10-CM | POA: Diagnosis not present

## 2017-01-27 DIAGNOSIS — M545 Low back pain: Secondary | ICD-10-CM | POA: Diagnosis not present

## 2017-01-27 DIAGNOSIS — M5441 Lumbago with sciatica, right side: Secondary | ICD-10-CM

## 2017-01-27 DIAGNOSIS — M5442 Lumbago with sciatica, left side: Secondary | ICD-10-CM | POA: Diagnosis not present

## 2017-01-27 DIAGNOSIS — I700 Atherosclerosis of aorta: Secondary | ICD-10-CM | POA: Diagnosis not present

## 2017-01-27 MED ORDER — PREDNISONE 20 MG PO TABS: 40.0000 mg | ORAL_TABLET | Freq: Every day | ORAL | 0 refills | Status: DC

## 2017-01-27 MED ORDER — CYCLOBENZAPRINE HCL 10 MG PO TABS: 10.0000 mg | ORAL_TABLET | Freq: Three times a day (TID) | ORAL | 0 refills | Status: DC | PRN

## 2017-01-27 MED ORDER — AMBULATORY NON FORMULARY MEDICATION: 0 refills | Status: DC

## 2017-01-27 NOTE — Progress Notes (Signed)
Subjective:    Patient ID: Justin Goodman, male    DOB: [birth year], 61 y.o.   MRN: [id number]  HPI 61 year old male comes in today complaining of low back pain.  He is actually had back pain problems for well over 20 years.  He had an exacerbation of his symptoms back in 2014 and actually saw 1 of our sports medicine providers.  At that time he did have an MRI of the lumbar spine done as below.  Resulted in him receiving an interlaminar epidural injection at L4-5 which he did respond well to.  He says this time the pain is actually worse.  It started back significantly worse after he was doing some weightlifting at the gym about 3 weeks ago.  He denies any specific injury or trauma or fall.  But just has gradually gotten more sore since then to the point where he is very stiff and having good limitation with flexibility.  MR Lumbar Spine Wo Contrast (Accession [id number]) (Order [id number])  Imaging  Date: 05/04/2012 Department: [facility] Released By: Katha Hamming Authorizing: Silverio Decamp, MD  Exam Information   Status Exam Begun  Exam Ended   Final [99] 05/04/2012 10:20 AM 05/04/2012 11:10 AM  PACS Images   Show images for MR Lumbar Spine Wo Contrast  Study Result   *RADIOLOGY REPORT*  Clinical Data: Back and bilateral leg pain.  MRI LUMBAR SPINE WITHOUT CONTRAST  Technique:  Multiplanar and multiecho pulse sequences of the lumbar spine were obtained without intravenous contrast.  Comparison: Lumbar spine series 01/16/2012.  Findings: The sagittal MR images demonstrate normal alignment of the lumbar vertebral bodies.  They demonstrate normal marrow signal.  Multilevel Schmorl's nodes are noted.  The last full intervertebral disc space is labeled L5-S1 and this correlates with the plain films.  The conus medullaris terminates at the top of L2.  The facets are normally aligned.  Moderate facet degenerative changes.  No pars  defects.  No significant paraspinal or retroperitoneal process is identified.  L1-2:  No significant findings.  L2-3:  Mild annular bulge but no significant spinal or foraminal stenosis.  L3-4:  Mild bulging annulus, short pedicles and facet disease contribute to mild spinal and bilateral lateral recess stenosis. There is mild left foraminal encroachment also.  L4-5:  Diffuse bulging annulus, small central disc protrusion, short pedicles and facet disease contribute to mild to moderate spinal stenosis and bilateral lateral recess stenosis, left greater than right.  There is also mild left foraminal stenosis.  L5-S1:  Bulging annulus and moderate facet disease contribute to mild bilateral lateral recess stenosis.  No significant spinal stenosis or foraminal stenosis.  IMPRESSION:  Multifactorial spinal and lateral recess stenosis at L3-4, L4-5 and L5-S1 with specific findings as discussed above.   Original Report Authenticated By: Marijo Sanes, M.D.      Review of Systems  BP 130/63   Pulse 84   Ht 5' 9"  (1.753 m)   Wt 170 lb (77.1 kg)   SpO2 96%   BMI 25.10 kg/m     Allergies  Allergen Reactions  . Amaryl [Glimepiride] Other (See Comments)    Frequent hypoglycemia  . Atorvastatin Other (See Comments)    HA   . Cymbalta [Duloxetine Hcl] Other (See Comments)    Dizziness, insomnia  . Glipizide Other (See Comments)    HA  . Losartan Other (See Comments)    Vision changes, HA.   Marland Kitchen Lyrica [Pregabalin] Other (See Comments)  Sedation  . Quinapril Cough  . Simvastatin Other (See Comments)    Severe myalgias.      Past Medical History:  Diagnosis Date  . Chronic back pain    only mild loss of T12-L1 disk ht on xray 1-12  . COPD (chronic obstructive pulmonary disease) ([place])   . Diabetes mellitus    w/ neuropathy  . Hypertension   . Smoker     No past surgical history on file.  Social History   Social History  . Marital status: Single     Spouse name: N/A  . Number of children: N/A  . Years of education: N/A   Occupational History  . Not on file.   Social History Main Topics  . Smoking status: Former Smoker    Packs/day: 1.00    Years: 20.00    Types: Cigarettes    Quit date: 2013  . Smokeless tobacco: Never Used  . Alcohol use No  . Drug use: No  . Sexual activity: Yes   Other Topics Concern  . Not on file   Social History Narrative  . No narrative on file    Family History  Problem Relation Age of Onset  . Diabetes Mother   . Alcohol abuse Father        Liver disease killed father @ age 25.  Marland Kitchen Alcohol abuse Brother   . Heart attack Brother        Died at age 29.    Outpatient Encounter Prescriptions as of 01/27/2017  Medication Sig  . ACCU-CHEK FASTCLIX LANCETS MISC Check blood glucose 4 times daily Diagnosis diabetes E11.40  . albuterol (PROVENTIL HFA;VENTOLIN HFA) 108 (90 Base) MCG/ACT inhaler Inhale 2 puffs into the lungs every 6 (six) hours as needed for wheezing or shortness of breath.  . Alcohol Swabs (B-D SINGLE USE SWABS REGULAR) PADS Check blood glucose 4 times daily Diagnosis diabetes E11.40  . AMBULATORY NON FORMULARY MEDICATION Medication Name: Glucometer with strips and lancet to test once a day (box of 100)  Dx 250.00 (diabetes.  . AMBULATORY NON FORMULARY MEDICATION Medication Name:  Freestyle Libre glucometer system.  Dx. Diabetes on insulin. E11.42  . amitriptyline (ELAVIL) 50 MG tablet Take 1 tablet (50 mg total) by mouth at bedtime.  Marland Kitchen aspirin EC 81 MG tablet Take 1 tablet (81 mg total) by mouth daily.  . B-D ULTRAFINE III SHORT PEN 31G X 8 MM MISC USE FOR DAILY INSULIN INJECTION AS DIRECTED BY PHYSICIAN  . Blood Glucose Calibration (ACCU-CHEK SMARTVIEW CONTROL) LIQD Check blood glucose 4 times daily Diagnosis diabetes E11.40  . Blood Glucose Monitoring Suppl (ACCU-CHEK NANO SMARTVIEW) w/Device KIT Check blood glucose 4 times daily Diagnosis diabetes E11.40  . cyclobenzaprine  (FLEXERIL) 10 MG tablet Take 1 tablet (10 mg total) by mouth 3 (three) times daily as needed for muscle spasms.  Marland Kitchen gabapentin (NEURONTIN) 600 MG tablet Take 2 tablets (1,200 mg total) by mouth 3 (three) times daily.  Marland Kitchen glucose blood (ACCU-CHEK SMARTVIEW) test strip Check blood glucose 4 times daily Diagnosis diabetes E11.40  . losartan (COZAAR) 50 MG tablet Take 1 tablet (50 mg total) by mouth daily.  . meloxicam (MOBIC) 15 MG tablet Take 1 tablet (15 mg total) by mouth daily.  . metFORMIN (GLUCOPHAGE) 1000 MG tablet Take 1 tablet (1,000 mg total) by mouth 2 (two) times daily with a meal.  . metoprolol tartrate (LOPRESSOR) 50 MG tablet Take 0.5 tablets (25 mg total) by mouth 2 (two) times daily.  Marland Kitchen  omeprazole (PRILOSEC) 40 MG capsule Take 1 capsule (40 mg total) by mouth daily.  . predniSONE (DELTASONE) 20 MG tablet Take 2 tablets (40 mg total) by mouth daily.  Nelva Nay SOLOSTAR 300 UNIT/ML SOPN Inject 32 Units into the skin daily.  . traZODone (DESYREL) 150 MG tablet Take 1 tablet (150 mg total) by mouth at bedtime.  Marland Kitchen umeclidinium bromide (INCRUSE ELLIPTA) 62.5 MCG/INH AEPB Inhale 1 puff into the lungs daily.  Marland Kitchen venlafaxine XR (EFFEXOR XR) 37.5 MG 24 hr capsule Take 1 capsule (37.5 mg total) by mouth daily. Ok to increase to 2 tabs after one week if needed.   No facility-administered encounter medications on file as of 01/27/2017.          Objective:   Physical Exam  Constitutional: He is oriented to person, place, and time. He appears well-developed and well-nourished.  HENT:  Head: Normocephalic and atraumatic.  Eyes: Conjunctivae and EOM are normal.  Cardiovascular: Normal rate.   Pulmonary/Chest: Effort normal.  Musculoskeletal:  Decrease normal flexion.  He is really only able to flex to about 45 degrees.  Decreased extension as well and decreased rotation right and left.  He is having pain over the lumbar spine that radiates bilaterally.  Just mildly tender over the lumbar spine.   Nontender over the SI joint.  Positive straight regulates bilaterally.  Hip, knee, ankle strength is 5 out of 5.  Patellar reflexes 1+ bilaterally  Neurological: He is alert and oriented to person, place, and time.  Skin: Skin is dry. No pallor.  Psychiatric: He has a normal mood and affect. His behavior is normal.  Vitals reviewed.      Assessment & Plan:  Spinal stenosis at L3-4, L4-5 and L5-S1/degenerative disc disease of the lumbar spine-initially we will treat conservatively.  We will start with a 5-day course of prednisone and a muscle relaxer.  Work on heat and gentle stretches.  I am also going to get an x-ray just to rule out fracture since he has been doing some weightlifting at the gym.  If not improving then let me know.  Based on the results we will either consider formal physical therapy or refer to 1 of our sports medicine doctors.  He did do well with an injection in the past though he says it actually lasted about 3 months.

## 2017-01-28 DIAGNOSIS — Z0100 Encounter for examination of eyes and vision without abnormal findings: Secondary | ICD-10-CM | POA: Diagnosis not present

## 2017-02-04 ENCOUNTER — Encounter: Payer: Self-pay | Admitting: Sports Medicine

## 2017-02-04 ENCOUNTER — Ambulatory Visit (INDEPENDENT_AMBULATORY_CARE_PROVIDER_SITE_OTHER): Payer: Medicare HMO | Admitting: Sports Medicine

## 2017-02-04 DIAGNOSIS — M48061 Spinal stenosis, lumbar region without neurogenic claudication: Secondary | ICD-10-CM

## 2017-02-04 MED ORDER — KETOROLAC TROMETHAMINE 30 MG/ML IJ SOLN
30.0000 mg | Freq: Once | INTRAMUSCULAR | Status: AC
  Administered 2017-02-04: 30 mg via INTRAMUSCULAR

## 2017-02-04 MED ORDER — TRAMADOL HCL 50 MG PO TABS: ORAL_TABLET | ORAL | 0 refills | Status: DC

## 2017-02-04 NOTE — Addendum Note (Signed)
Addended by: Baird KayUGLAS, Takyra Cantrall M on: 02/04/2017 09:28 AM   Modules accepted: Orders

## 2017-02-04 NOTE — Assessment & Plan Note (Signed)
Severe at L4-L5, lesser so at L3-L4. Responded well about 4 years ago to an L4-L5 interlaminar epidural. His MRI is a little bit old, he would like to try and get the epidural without a new MRI, orders placed. Prednisone was ineffective, he is taking gabapentin, amitriptyline, Effexor without improvement. Proceeding to epidural, tramadol for pain and Toradol intramuscular here in the office, return to see me 1 month after the epidural.

## 2017-02-04 NOTE — Progress Notes (Signed)
   Subjective:    I'm seeing this patient as a consultation for: Dr. Nani Gasseratherine Metheney  CC: Low back pain  HPI: This is a pleasant 61 year old male with known multilevel lumbar spinal stenosis with degenerative disc disease, axial back pain with right worse than left radicular symptoms down to the bottom of the foot.  No bowel or bladder dysfunction, saddle numbness, no constitutional symptoms.  No recent trauma.  He responded well to a lumbar epidural about 4 years ago, I have not seen him since.  Past medical history, Surgical history, Family history not pertinant except as noted below, Social history, Allergies, and medications have been entered into the medical record, reviewed, and no changes needed.   Review of Systems: No headache, visual changes, nausea, vomiting, diarrhea, constipation, dizziness, abdominal pain, skin rash, fevers, chills, night sweats, weight loss, swollen lymph nodes, body aches, joint swelling, muscle aches, chest pain, shortness of breath, mood changes, visual or auditory hallucinations.   Objective:   General: Well Developed, well nourished, and in no acute distress.  Neuro:  Extra-ocular muscles intact, able to move all 4 extremities, sensation grossly intact.  Deep tendon reflexes tested were normal. Psych: Alert and oriented, mood congruent with affect. ENT:  Ears and nose appear unremarkable.  Hearing grossly normal. Neck: Unremarkable overall appearance, trachea midline.  No visible thyroid enlargement. Eyes: Conjunctivae and lids appear unremarkable.  Pupils equal and round. Skin: Warm and dry, no rashes noted.  Cardiovascular: Pulses palpable, no extremity edema. Back Exam:  Inspection: Unremarkable  Motion: Flexion 45 deg, Extension 45 deg, Side Bending to 45 deg bilaterally,  Rotation to 45 deg bilaterally  SLR laying: Negative  XSLR laying: Negative  Palpable tenderness: None. FABER: negative. Sensory change: Gross sensation intact to all  lumbar and sacral dermatomes.  Reflexes: 2+ at both patellar tendons, 2+ at achilles tendons, Babinski's downgoing.  Strength at foot  Plantar-flexion: 5/5 Dorsi-flexion: 5/5 Eversion: 5/5 Inversion: 5/5  Leg strength  Quad: 5/5 Hamstring: 5/5 Hip flexor: 5/5 Hip abductors: 5/5  Gait unremarkable.  Impression and Recommendations:   This case required medical decision making of moderate complexity.  Spinal stenosis of lumbar region Severe at L4-L5, lesser so at L3-L4. Responded well about 4 years ago to an L4-L5 interlaminar epidural. His MRI is a little bit old, he would like to try and get the epidural without a new MRI, orders placed. Prednisone was ineffective, he is taking gabapentin, amitriptyline, Effexor without improvement. Proceeding to epidural, tramadol for pain and Toradol intramuscular here in the office, return to see me 1 month after the epidural.  ___________________________________________ Ihor Austinhomas J. Benjamin Stainhekkekandam, M.D., ABFM., CAQSM. Primary Care and Sports Medicine [facility]  Adjunct Instructor of Family Medicine  [employer]

## 2017-02-05 ENCOUNTER — Telehealth: Payer: Self-pay | Admitting: Family Medicine

## 2017-02-05 NOTE — Telephone Encounter (Signed)
Pt called yesterday and today to see if his epidural injection has been scheduled yet because he states he is in a lot of pain. Please schedule as soon as possible and call patient to let him know. Thanks

## 2017-02-05 NOTE — Telephone Encounter (Signed)
We don't actually book the appointments but [facility] [phone]) was notified and they will call him.

## 2017-02-06 NOTE — Telephone Encounter (Signed)
Venlafaxine has been approved. Patient has already picked up medication.

## 2017-02-10 ENCOUNTER — Ambulatory Visit
Admission: RE | Admit: 2017-02-10 | Discharge: 2017-02-10 | Disposition: A | Discharge status: Home / Self Care | Payer: Medicare HMO | Source: Ambulatory Visit | Attending: Sports Medicine | Admitting: Sports Medicine

## 2017-02-10 DIAGNOSIS — M48061 Spinal stenosis, lumbar region without neurogenic claudication: Secondary | ICD-10-CM | POA: Diagnosis not present

## 2017-02-10 MED ORDER — METHYLPREDNISOLONE ACETATE 40 MG/ML INJ SUSP (RADIOLOG
120.0000 mg | Freq: Once | INTRAMUSCULAR | Status: AC
  Administered 2017-02-10: 120 mg via EPIDURAL

## 2017-02-10 MED ORDER — IOPAMIDOL (ISOVUE-M 200) INJECTION 41%
1.0000 mL | Freq: Once | INTRAMUSCULAR | Status: AC
  Administered 2017-02-10: 1 mL via EPIDURAL

## 2017-02-10 NOTE — Discharge Instructions (Signed)

## 2017-02-27 DIAGNOSIS — E1140 Type 2 diabetes mellitus with diabetic neuropathy, unspecified: Secondary | ICD-10-CM | POA: Diagnosis not present

## 2017-02-27 DIAGNOSIS — I10 Essential (primary) hypertension: Secondary | ICD-10-CM | POA: Diagnosis not present

## 2017-02-27 DIAGNOSIS — E1165 Type 2 diabetes mellitus with hyperglycemia: Secondary | ICD-10-CM | POA: Diagnosis not present

## 2017-02-27 DIAGNOSIS — R29818 Other symptoms and signs involving the nervous system: Secondary | ICD-10-CM | POA: Diagnosis not present

## 2017-02-27 DIAGNOSIS — R262 Difficulty in walking, not elsewhere classified: Secondary | ICD-10-CM | POA: Diagnosis not present

## 2017-02-27 DIAGNOSIS — G319 Degenerative disease of nervous system, unspecified: Secondary | ICD-10-CM | POA: Diagnosis not present

## 2017-02-27 DIAGNOSIS — R42 Dizziness and giddiness: Secondary | ICD-10-CM | POA: Diagnosis not present

## 2017-02-27 DIAGNOSIS — Z79899 Other long term (current) drug therapy: Secondary | ICD-10-CM | POA: Diagnosis not present

## 2017-02-27 DIAGNOSIS — Z7982 Long term (current) use of aspirin: Secondary | ICD-10-CM | POA: Diagnosis not present

## 2017-02-27 DIAGNOSIS — Z7984 Long term (current) use of oral hypoglycemic drugs: Secondary | ICD-10-CM | POA: Diagnosis not present

## 2017-02-27 DIAGNOSIS — K219 Gastro-esophageal reflux disease without esophagitis: Secondary | ICD-10-CM | POA: Diagnosis not present

## 2017-02-27 DIAGNOSIS — Z87891 Personal history of nicotine dependence: Secondary | ICD-10-CM | POA: Diagnosis not present

## 2017-02-27 DIAGNOSIS — M170 Bilateral primary osteoarthritis of knee: Secondary | ICD-10-CM | POA: Diagnosis not present

## 2017-02-27 DIAGNOSIS — R251 Tremor, unspecified: Secondary | ICD-10-CM | POA: Diagnosis not present

## 2017-03-09 ENCOUNTER — Other Ambulatory Visit: Payer: Self-pay | Admitting: *Deleted

## 2017-03-09 DIAGNOSIS — E1142 Type 2 diabetes mellitus with diabetic polyneuropathy: Secondary | ICD-10-CM

## 2017-03-09 MED ORDER — FREESTYLE LIBRE 14 DAY READER DEVI: 1.0000 [IU] | Freq: Once | 0 refills | Status: DC

## 2017-03-09 MED ORDER — AMBULATORY NON FORMULARY MEDICATION: 99 refills | Status: DC

## 2017-03-10 ENCOUNTER — Other Ambulatory Visit: Payer: Self-pay | Admitting: *Deleted

## 2017-03-10 ENCOUNTER — Ambulatory Visit (INDEPENDENT_AMBULATORY_CARE_PROVIDER_SITE_OTHER): Payer: Medicare HMO

## 2017-03-10 ENCOUNTER — Encounter: Payer: Self-pay | Admitting: Sports Medicine

## 2017-03-10 ENCOUNTER — Ambulatory Visit: Payer: Medicare HMO | Admitting: Sports Medicine

## 2017-03-10 DIAGNOSIS — M170 Bilateral primary osteoarthritis of knee: Secondary | ICD-10-CM

## 2017-03-10 DIAGNOSIS — M25561 Pain in right knee: Secondary | ICD-10-CM

## 2017-03-10 DIAGNOSIS — G8929 Other chronic pain: Secondary | ICD-10-CM

## 2017-03-10 DIAGNOSIS — M25562 Pain in left knee: Secondary | ICD-10-CM | POA: Diagnosis not present

## 2017-03-10 DIAGNOSIS — M48061 Spinal stenosis, lumbar region without neurogenic claudication: Secondary | ICD-10-CM | POA: Diagnosis not present

## 2017-03-10 MED ORDER — DICLOFENAC SODIUM 2 % TD SOLN
2.0000 | Freq: Two times a day (BID) | TRANSDERMAL | 11 refills | Status: DC
Start: 2017-03-10 — End: 2017-04-14

## 2017-03-10 NOTE — Assessment & Plan Note (Signed)
75% response to L4-L5 interlaminar epidural. We are going to repeat epidural #2. Return in 1 month.

## 2017-03-10 NOTE — Assessment & Plan Note (Signed)
X-rays, I did provide him samples of topical diclofenac. Return to see me in 1 month, if persistent pain we will consider injections, likely viscosupplementation, I do want to minimize the steroid he is getting because we are concurrently doing epidurals.

## 2017-03-10 NOTE — Progress Notes (Signed)
   Subjective:    I'm seeing this patient as a consultation for: Dr. Nani Gasseratherine Metheney  CC: Bilateral knee pain  HPI: This is a pleasant 61 year old male, for years he has had knee pain, medial joint line and anteriorly, worse with standing, going up and down stairs, no mechanical symptoms, moderate gelling.  Pain does not radiate.  Localized without radiation.  Lumbar degenerative disc disease: 75% response to L4-L5 right-sided epidural, interlaminar.  Agreeable to proceed with a second epidural.  Past medical history, Surgical history, Family history not pertinant except as noted below, Social history, Allergies, and medications have been entered into the medical record, reviewed, and no changes needed.   Review of Systems: No headache, visual changes, nausea, vomiting, diarrhea, constipation, dizziness, abdominal pain, skin rash, fevers, chills, night sweats, weight loss, swollen lymph nodes, body aches, joint swelling, muscle aches, chest pain, shortness of breath, mood changes, visual or auditory hallucinations.   Objective:   General: Well Developed, well nourished, and in no acute distress.  Neuro:  Extra-ocular muscles intact, able to move all 4 extremities, sensation grossly intact.  Deep tendon reflexes tested were normal. Psych: Alert and oriented, mood congruent with affect. ENT:  Ears and nose appear unremarkable.  Hearing grossly normal. Neck: Unremarkable overall appearance, trachea midline.  No visible thyroid enlargement. Eyes: Conjunctivae and lids appear unremarkable.  Pupils equal and round. Skin: Warm and dry, no rashes noted.  Cardiovascular: Pulses palpable, no extremity edema. Bilateral knees: No effusion, tender to palpation under the patellar facets and the medial joint line ROM normal in flexion and extension and lower leg rotation. Ligaments with solid consistent endpoints including ACL, PCL, LCL, MCL. Negative Mcmurray's and provocative meniscal tests. Non  painful patellar compression. Patellar and quadriceps tendons unremarkable. Hamstring and quadriceps strength is normal.  Impression and Recommendations:   This case required medical decision making of moderate complexity.  Spinal stenosis of lumbar region 75% response to L4-L5 interlaminar epidural. We are going to repeat epidural #2. Return in 1 month.  Primary osteoarthritis of both knees X-rays, I did provide him samples of topical diclofenac. Return to see me in 1 month, if persistent pain we will consider injections, likely viscosupplementation, I do want to minimize the steroid he is getting because we are concurrently doing epidurals.  ___________________________________________ Justin Goodman, M.D., ABFM., CAQSM. Primary Care and Sports Medicine [facility]  Adjunct Instructor of Family Medicine  [employer]

## 2017-03-13 ENCOUNTER — Other Ambulatory Visit: Payer: Self-pay | Admitting: *Deleted

## 2017-03-13 DIAGNOSIS — E1142 Type 2 diabetes mellitus with diabetic polyneuropathy: Secondary | ICD-10-CM

## 2017-03-13 MED ORDER — VENLAFAXINE HCL ER 37.5 MG PO CP24: 37.5000 mg | ORAL_CAPSULE | Freq: Every day | ORAL | 6 refills | Status: DC

## 2017-03-19 ENCOUNTER — Ambulatory Visit
Admission: RE | Admit: 2017-03-19 | Discharge: 2017-03-19 | Disposition: A | Discharge status: Home / Self Care | Payer: Medicare HMO | Source: Ambulatory Visit | Attending: Sports Medicine | Admitting: Sports Medicine

## 2017-03-19 DIAGNOSIS — M48061 Spinal stenosis, lumbar region without neurogenic claudication: Secondary | ICD-10-CM | POA: Diagnosis not present

## 2017-03-19 MED ORDER — IOPAMIDOL (ISOVUE-M 200) INJECTION 41%
1.0000 mL | Freq: Once | INTRAMUSCULAR | Status: AC
  Administered 2017-03-19: 1 mL via EPIDURAL

## 2017-03-19 MED ORDER — METHYLPREDNISOLONE ACETATE 40 MG/ML INJ SUSP (RADIOLOG
120.0000 mg | Freq: Once | INTRAMUSCULAR | Status: AC
  Administered 2017-03-19: 120 mg via EPIDURAL

## 2017-04-14 ENCOUNTER — Ambulatory Visit (INDEPENDENT_AMBULATORY_CARE_PROVIDER_SITE_OTHER): Payer: Medicare HMO | Admitting: Family Medicine

## 2017-04-14 ENCOUNTER — Ambulatory Visit (INDEPENDENT_AMBULATORY_CARE_PROVIDER_SITE_OTHER): Payer: Medicare HMO | Admitting: Sports Medicine

## 2017-04-14 ENCOUNTER — Encounter: Payer: Self-pay | Admitting: Family Medicine

## 2017-04-14 VITALS — BP 114/63 | HR 77 | Ht 69.0 in | Wt 172.0 lb

## 2017-04-14 DIAGNOSIS — I10 Essential (primary) hypertension: Secondary | ICD-10-CM | POA: Diagnosis not present

## 2017-04-14 DIAGNOSIS — M48061 Spinal stenosis, lumbar region without neurogenic claudication: Secondary | ICD-10-CM

## 2017-04-14 DIAGNOSIS — R42 Dizziness and giddiness: Secondary | ICD-10-CM

## 2017-04-14 DIAGNOSIS — M170 Bilateral primary osteoarthritis of knee: Secondary | ICD-10-CM

## 2017-04-14 DIAGNOSIS — E1142 Type 2 diabetes mellitus with diabetic polyneuropathy: Secondary | ICD-10-CM | POA: Diagnosis not present

## 2017-04-14 LAB — POCT GLYCOSYLATED HEMOGLOBIN (HGB A1C): HEMOGLOBIN A1C: 10.3

## 2017-04-14 MED ORDER — VENLAFAXINE HCL ER 150 MG PO CP24: 150.0000 mg | ORAL_CAPSULE | Freq: Every day | ORAL | 3 refills | Status: DC

## 2017-04-14 MED ORDER — MECLIZINE HCL 25 MG PO TABS: 25.0000 mg | ORAL_TABLET | Freq: Three times a day (TID) | ORAL | 1 refills | Status: DC | PRN

## 2017-04-14 MED ORDER — LOSARTAN POTASSIUM 50 MG PO TABS: 50.0000 mg | ORAL_TABLET | Freq: Every day | ORAL | 3 refills | Status: DC

## 2017-04-14 NOTE — Progress Notes (Signed)
Subjective:    CC: DM   HPI:  Diabetes - no hypoglycemic events. No wounds or sores that are not healing well. No increased thirst or urination. Checking glucose at home. Taking medications as prescribed without any side effects.  Sugars running from 189-300.  He has not been able to exercise for the last 2 months because of his feet and his knees.  He has been getting injections and seeing sports medicine for this.  He is currently taking 32 units of Toujeo daily.  Hypertension- Pt denies chest pain, SOB, dizziness, or heart palpitations.  Taking meds as directed w/o problems.  Denies medication side effects.     Diabetic peripheral neuropathy-currently on gabapentin.  We did switch to Cymbalta to Effexor but says he did not really get any benefit from this either so discontinued it.  Though we were not able to adjust his dose to maximal benefit.   Past medical history, Surgical history, Family history not pertinant except as noted below, Social history, Allergies, and medications have been entered into the medical record, reviewed, and corrections made.   Review of Systems: No fevers, chills, night sweats, weight loss, chest pain, or shortness of breath.  Complain of a persistent dry cough.  Objective:    General: Well Developed, well nourished, and in no acute distress.  Neuro: Alert and oriented x3, extra-ocular muscles intact, sensation grossly intact.  HEENT: Normocephalic, atraumatic  Skin: Warm and dry, no rashes. Cardiac: Regular rate and rhythm, no murmurs rubs or gallops, no lower extremity edema.  Respiratory: Clear to auscultation bilaterally. Not using accessory muscles, speaking in full sentences.   Impression and Recommendations:    DM-uncontrolled.  Hemoglobin A1c up to 10.3.  Crease Toujeo to 38 units daily and call us in 1 week so that we can adjust his dose.  Unfortunately he has not been able to exercise which I think has made a big impact on his blood sugars as  well as getting the steroid injections.  Also suspect that is probably just losing some pancreatic function.  Continue with metformin.  He cannot afford some of the newer diabetic medications and is intolerant to glipizide and glimepiride. F/U in 3 mo.   Diabetic peripheral neuropathy-continue with gabapentin.  HTN - Well controlled. Continue current regimen. Follow up in  3 months.   Vertigo - he would like a refill on the meclizine.

## 2017-04-14 NOTE — Progress Notes (Signed)
Subjective:    CC: Recheck back and knee  HPI: Bilateral knee osteoarthritis: Did not improve with topical agents.  Moderate, persistent, localized at the joint lines, significant gelling, no mechanical symptoms, no trauma.  Lumbar spinal stenosis: At this point is failed physical therapy, gabapentin, NSAIDs, tramadol, 2 lumbar epidurals, has persistent back pain although slightly improved from his injections.  He is taking 37.5-75 mg of venlafaxine.  I reviewed the past medical history, family history, social history, surgical history, and allergies today and no changes were needed.  Please see the problem list section below in epic for further details.  Past Medical History: Past Medical History:  Diagnosis Date  . Chronic back pain    only mild loss of T12-L1 disk ht on xray 1-12  . COPD (chronic obstructive pulmonary disease) (HCC)   . Diabetes mellitus    w/ neuropathy  . Hypertension   . Smoker    Past Surgical History: No past surgical history on file. Social History: Social History   Socioeconomic History  . Marital status: Single    Spouse name: Not on file  . Number of children: Not on file  . Years of education: Not on file  . Highest education level: Not on file  Social Needs  . Financial resource strain: Not on file  . Food insecurity - worry: Not on file  . Food insecurity - inability: Not on file  . Transportation needs - medical: Not on file  . Transportation needs - non-medical: Not on file  Occupational History  . Not on file  Tobacco Use  . Smoking status: Former Smoker    Packs/day: 1.00    Years: 20.00    Pack years: 20.00    Types: Cigarettes    Last attempt to quit: 2013    Years since quitting: 6.0  . Smokeless tobacco: Never Used  Substance and Sexual Activity  . Alcohol use: No  . Drug use: No  . Sexual activity: Yes  Other Topics Concern  . Not on file  Social History Narrative  . Not on file   Family History: Family History    Problem Relation Age of Onset  . Diabetes Mother   . Alcohol abuse Father        Liver disease killed father @ age 66.  Marland Kitchen Alcohol abuse Brother   . Heart attack Brother        Died at age 73.   Allergies: Allergies  Allergen Reactions  . Amaryl [Glimepiride] Other (See Comments)    Frequent hypoglycemia  . Atorvastatin Other (See Comments)    HA   . Cymbalta [Duloxetine Hcl] Other (See Comments)    Dizziness, insomnia  . Glipizide Other (See Comments)    HA  . Losartan Other (See Comments)    Vision changes, HA.   Marland Kitchen Lyrica [Pregabalin] Other (See Comments)    Sedation  . Quinapril Cough  . Simvastatin Other (See Comments)    Severe myalgias.     Medications: See med rec.  Review of Systems: No fevers, chills, night sweats, weight loss, chest pain, or shortness of breath.   Objective:    General: Well Developed, well nourished, and in no acute distress.  Neuro: Alert and oriented x3, extra-ocular muscles intact, sensation grossly intact.  HEENT: Normocephalic, atraumatic, pupils equal round reactive to light, neck supple, no masses, no lymphadenopathy, thyroid nonpalpable.  Skin: Warm and dry, no rashes. Cardiac: Regular rate and rhythm, no murmurs rubs or gallops, no lower  extremity edema.  Respiratory: Clear to auscultation bilaterally. Not using accessory muscles, speaking in full sentences. Bilateral knees: Normal to inspection with no erythema or effusion or obvious bony abnormalities. Tender to palpation at the joint lines in the patellar facets ROM normal in flexion and extension and lower leg rotation. Ligaments with solid consistent endpoints including ACL, PCL, LCL, MCL. Negative Mcmurray's and provocative meniscal tests. Non painful patellar compression. Patellar and quadriceps tendons unremarkable. Hamstring and quadriceps strength is normal.  Procedure: Real-time Ultrasound Guided Injection of left knee Device: GE Logiq E  Verbal informed consent  obtained.  Time-out conducted.  Noted no overlying erythema, induration, or other signs of local infection.  Skin prepped in a sterile fashion.  Local anesthesia: Topical Ethyl chloride.  With sterile technique and under real time ultrasound guidance: 1 cc kenalog 40, 2 cc lidocaine, 2 cc bupivacaine injected easily into the suprapatellar recess. Completed without difficulty  Pain immediately resolved suggesting accurate placement of the medication.  Advised to call if fevers/chills, erythema, induration, drainage, or persistent bleeding.  Images permanently stored and available for review in the ultrasound unit.  Impression: Technically successful ultrasound guided injection.  Procedure: Real-time Ultrasound Guided Injection of right knee Device: GE Logiq E  Verbal informed consent obtained.  Time-out conducted.  Noted no overlying erythema, induration, or other signs of local infection.  Skin prepped in a sterile fashion.  Local anesthesia: Topical Ethyl chloride.  With sterile technique and under real time ultrasound guidance: 1 cc kenalog 40, 2 cc lidocaine, 2 cc bupivacaine injected easily into the suprapatellar recess. Completed without difficulty  Pain immediately resolved suggesting accurate placement of the medication.  Advised to call if fevers/chills, erythema, induration, drainage, or persistent bleeding.  Images permanently stored and available for review in the ultrasound unit.  Impression: Technically successful ultrasound guided injection.  Impression and Recommendations:    Primary osteoarthritis of both knees Bilateral injections, failed conservative measures. We are stopping epidurals so we are going to do steroid injections. Follow-up in 1 month, if insufficient relief we will proceed with Visco supplementation.  Spinal stenosis of lumbar region 75% response to initial L4-L5 interlaminar epidural, did not really have much response to epidural #2. He is on  high-dose gabapentin, tramadol, at this point where he has maximally medically managed his spinal stenosis but declines surgical intervention. I am going to bump his Effexor up to 150 mg to get some more neuropathic effect. Return in 1 month.  ___________________________________________ Ihor Austinhomas J. Benjamin Stainhekkekandam, M.D., ABFM., CAQSM. Primary Care and Sports Medicine [place] [facility] [facility]  Adjunct Instructor of Family Medicine  [employer]

## 2017-04-14 NOTE — Assessment & Plan Note (Signed)
75% response to initial L4-L5 interlaminar epidural, did not really have much response to epidural #2. He is on high-dose gabapentin, tramadol, at this point where he has maximally medically managed his spinal stenosis but declines surgical intervention. I am going to bump his Effexor up to 150 mg to get some more neuropathic effect. Return in 1 month.

## 2017-04-14 NOTE — Assessment & Plan Note (Signed)
Bilateral injections, failed conservative measures. We are stopping epidurals so we are going to do steroid injections. Follow-up in 1 month, if insufficient relief we will proceed with Visco supplementation.

## 2017-04-15 ENCOUNTER — Other Ambulatory Visit: Payer: Self-pay | Admitting: Family Medicine

## 2017-05-08 ENCOUNTER — Other Ambulatory Visit: Payer: Self-pay | Admitting: *Deleted

## 2017-05-08 MED ORDER — TOUJEO SOLOSTAR 300 UNIT/ML ~~LOC~~ SOPN: 32.0000 [IU] | PEN_INJECTOR | Freq: Every day | SUBCUTANEOUS | 6 refills | Status: DC

## 2017-05-12 ENCOUNTER — Other Ambulatory Visit: Payer: Self-pay

## 2017-05-12 MED ORDER — TOUJEO SOLOSTAR 300 UNIT/ML ~~LOC~~ SOPN: 32.0000 [IU] | PEN_INJECTOR | Freq: Every day | SUBCUTANEOUS | 6 refills | Status: DC

## 2017-05-15 ENCOUNTER — Ambulatory Visit: Payer: Medicare HMO | Admitting: Sports Medicine

## 2017-05-15 DIAGNOSIS — Z0189 Encounter for other specified special examinations: Secondary | ICD-10-CM

## 2017-05-19 ENCOUNTER — Ambulatory Visit (INDEPENDENT_AMBULATORY_CARE_PROVIDER_SITE_OTHER): Payer: Medicare HMO | Admitting: Sports Medicine

## 2017-05-19 ENCOUNTER — Encounter: Payer: Self-pay | Admitting: Sports Medicine

## 2017-05-19 DIAGNOSIS — M48061 Spinal stenosis, lumbar region without neurogenic claudication: Secondary | ICD-10-CM

## 2017-05-19 MED ORDER — VENLAFAXINE HCL ER 75 MG PO CP24: 225.0000 mg | ORAL_CAPSULE | Freq: Every day | ORAL | 1 refills | Status: DC

## 2017-05-19 MED ORDER — TRAMADOL HCL 50 MG PO TABS
ORAL_TABLET | ORAL | 1 refills | Status: DC
Start: 2017-05-19 — End: 2017-12-03

## 2017-05-19 NOTE — Assessment & Plan Note (Signed)
Moderate pain relief with his 2 epidurals. Currently taking high-dose gabapentin, we increased his Effexor to 150 mg at the last visit which also provided some benefit. I am going to take him up to the maximum of 225 of Effexor (he tells me he is still taking it). Adding tramadol for use for breakthrough pain.

## 2017-05-19 NOTE — Progress Notes (Signed)
Subjective:    CC: Follow-up  HPI: Bilateral knee osteoarthritis: Resolved after injections.  Lumbar spinal stenosis: Not amenable to any type of surgical intervention, he has had 2 epidurals, partial relief, we continue gabapentin, I increase his Effexor to 150 mg and he tells me he had good relief, he did however tell his PCP he was no longer taking his Effexor.  Still with mild to moderate pain.  He tells me he would be happy with a 75% pain relief.  I reviewed the past medical history, family history, social history, surgical history, and allergies today and no changes were needed.  Please see the problem list section below in epic for further details.  Past Medical History: Past Medical History:  Diagnosis Date  . Chronic back pain    only mild loss of T12-L1 disk ht on xray 1-12  . COPD (chronic obstructive pulmonary disease) (HCC)   . Diabetes mellitus    w/ neuropathy  . Hypertension   . Smoker    Past Surgical History: No past surgical history on file. Social History: Social History   Socioeconomic History  . Marital status: Single    Spouse name: None  . Number of children: None  . Years of education: None  . Highest education level: None  Social Needs  . Financial resource strain: None  . Food insecurity - worry: None  . Food insecurity - inability: None  . Transportation needs - medical: None  . Transportation needs - non-medical: None  Occupational History  . None  Tobacco Use  . Smoking status: Former Smoker    Packs/day: 1.00    Years: 20.00    Pack years: 20.00    Types: Cigarettes    Last attempt to quit: 2013    Years since quitting: 6.1  . Smokeless tobacco: Never Used  Substance and Sexual Activity  . Alcohol use: No  . Drug use: No  . Sexual activity: Yes  Other Topics Concern  . None  Social History Narrative  . None   Family History: Family History  Problem Relation Age of Onset  . Diabetes Mother   . Alcohol abuse Father    Liver disease killed father @ age 39.  Marland Kitchen Alcohol abuse Brother   . Heart attack Brother        Died at age 33.   Allergies: Allergies  Allergen Reactions  . Amaryl [Glimepiride] Other (See Comments)    Frequent hypoglycemia  . Atorvastatin Other (See Comments)    HA   . Cymbalta [Duloxetine Hcl] Other (See Comments)    Dizziness, insomnia  . Glipizide Other (See Comments)    HA  . Losartan Other (See Comments)    Vision changes, HA.   Marland Kitchen Lyrica [Pregabalin] Other (See Comments)    Sedation  . Quinapril Cough  . Simvastatin Other (See Comments)    Severe myalgias.     Medications: See med rec.  Review of Systems: No fevers, chills, night sweats, weight loss, chest pain, or shortness of breath.   Objective:    General: Well Developed, well nourished, and in no acute distress.  Neuro: Alert and oriented x3, extra-ocular muscles intact, sensation grossly intact.  HEENT: Normocephalic, atraumatic, pupils equal round reactive to light, neck supple, no masses, no lymphadenopathy, thyroid nonpalpable.  Skin: Warm and dry, no rashes. Cardiac: Regular rate and rhythm, no murmurs rubs or gallops, no lower extremity edema.  Respiratory: Clear to auscultation bilaterally. Not using accessory muscles, speaking in full sentences.  Impression and Recommendations:    Spinal stenosis of lumbar region Moderate pain relief with his 2 epidurals. Currently taking high-dose gabapentin, we increased his Effexor to 150 mg at the last visit which also provided some benefit. I am going to take him up to the maximum of 225 of Effexor (he tells me he is still taking it). Adding tramadol for use for breakthrough pain.   I spent 25 minutes with this patient, greater than 50% was face-to-face time counseling regarding the above diagnoses ___________________________________________ Ihor Austinhomas J. Benjamin Stainhekkekandam, M.D., ABFM., CAQSM. Primary Care and Sports Medicine  [facility]  Adjunct Instructor of Family Medicine  [employer]

## 2017-05-29 ENCOUNTER — Telehealth: Payer: Self-pay | Admitting: Sports Medicine

## 2017-05-29 DIAGNOSIS — M48061 Spinal stenosis, lumbar region without neurogenic claudication: Secondary | ICD-10-CM

## 2017-05-29 NOTE — Telephone Encounter (Signed)
Pt advised.

## 2017-05-29 NOTE — Telephone Encounter (Signed)
Pt called clinic stating the new Rx for Effexor paired with the Tramadol is not helping his back/leg pain. Pt reports he is having leg pain so bad that it hurts to walk. Pt rates the pain a 10/10. He is not due for follow up for 6 weeks. Questions if he needs to come in, or if he can try something different. Currently using [id number] - [address], [place] - [address]

## 2017-05-29 NOTE — Telephone Encounter (Signed)
Its time to get this guy into a pain clinic. Placing referral.

## 2017-06-02 DIAGNOSIS — S79911A Unspecified injury of right hip, initial encounter: Secondary | ICD-10-CM | POA: Diagnosis not present

## 2017-06-02 DIAGNOSIS — E119 Type 2 diabetes mellitus without complications: Secondary | ICD-10-CM | POA: Diagnosis not present

## 2017-06-02 DIAGNOSIS — M25852 Other specified joint disorders, left hip: Secondary | ICD-10-CM | POA: Diagnosis not present

## 2017-06-02 DIAGNOSIS — Z791 Long term (current) use of non-steroidal anti-inflammatories (NSAID): Secondary | ICD-10-CM | POA: Diagnosis not present

## 2017-06-02 DIAGNOSIS — Z87891 Personal history of nicotine dependence: Secondary | ICD-10-CM | POA: Diagnosis not present

## 2017-06-02 DIAGNOSIS — S7001XA Contusion of right hip, initial encounter: Secondary | ICD-10-CM | POA: Diagnosis not present

## 2017-06-02 DIAGNOSIS — K219 Gastro-esophageal reflux disease without esophagitis: Secondary | ICD-10-CM | POA: Diagnosis not present

## 2017-06-02 DIAGNOSIS — S8991XA Unspecified injury of right lower leg, initial encounter: Secondary | ICD-10-CM | POA: Diagnosis not present

## 2017-06-02 DIAGNOSIS — I10 Essential (primary) hypertension: Secondary | ICD-10-CM | POA: Diagnosis not present

## 2017-06-02 DIAGNOSIS — S8001XA Contusion of right knee, initial encounter: Secondary | ICD-10-CM | POA: Diagnosis not present

## 2017-06-02 DIAGNOSIS — Z7982 Long term (current) use of aspirin: Secondary | ICD-10-CM | POA: Diagnosis not present

## 2017-06-02 DIAGNOSIS — G629 Polyneuropathy, unspecified: Secondary | ICD-10-CM | POA: Diagnosis not present

## 2017-06-02 DIAGNOSIS — M25851 Other specified joint disorders, right hip: Secondary | ICD-10-CM | POA: Diagnosis not present

## 2017-06-02 DIAGNOSIS — W1789XA Other fall from one level to another, initial encounter: Secondary | ICD-10-CM | POA: Diagnosis not present

## 2017-06-02 DIAGNOSIS — M2578 Osteophyte, vertebrae: Secondary | ICD-10-CM | POA: Diagnosis not present

## 2017-06-30 ENCOUNTER — Encounter: Payer: Self-pay | Admitting: Sports Medicine

## 2017-06-30 ENCOUNTER — Ambulatory Visit (INDEPENDENT_AMBULATORY_CARE_PROVIDER_SITE_OTHER): Payer: Medicare HMO | Admitting: Sports Medicine

## 2017-06-30 DIAGNOSIS — E1142 Type 2 diabetes mellitus with diabetic polyneuropathy: Secondary | ICD-10-CM

## 2017-06-30 MED ORDER — ALPHA-LIPOIC ACID 600 MG PO CAPS: 1.0000 | ORAL_CAPSULE | Freq: Every day | ORAL | 11 refills | Status: DC

## 2017-06-30 MED ORDER — VITAMIN B-6 50 MG PO TABS: 50.0000 mg | ORAL_TABLET | Freq: Every day | ORAL | 3 refills | Status: DC

## 2017-06-30 MED ORDER — GABAPENTIN 800 MG PO TABS: 1600.0000 mg | ORAL_TABLET | Freq: Two times a day (BID) | ORAL | 3 refills | Status: DC

## 2017-06-30 NOTE — Assessment & Plan Note (Signed)
Worsening neuropathic pain. Diabetes is uncontrolled with an A1c over 10. I did explain to him that we would never be able to control his neuropathy as long as his diabetes was uncontrolled. Increasing gabapentin to 800 mg 4 times per day, adding alpha lipoic acid, vitamin B6. Continue tramadol as needed, continue Effexor at 225 mg, continue amitriptyline. He did have a fall, ultimately we may need to back off some of his neuropathics if this continues. Return in a month.

## 2017-06-30 NOTE — Progress Notes (Signed)
Subjective:    CC: Follow-up   HPI: This is a somewhat miserable 62 year old male with uncontrolled diabetes, last A1c was over 10.  We have been treating for various types of pain, knee pain, hip pain, back pain, ultimately his complaints today are more neuropathic type pains radiating down the arms and legs, he is taking 600 mg of gabapentin 3 times per day, occasional tramadol.  Amitriptyline.  Persistent symptoms.  He is requesting referral to a vascular surgeon for removal of dead veins, but was informed that these are not the cause of his pain.  I reviewed the past medical history, family history, social history, surgical history, and allergies today and no changes were needed.  Please see the problem list section below in epic for further details.  Past Medical History: Past Medical History:  Diagnosis Date  . Chronic back pain    only mild loss of T12-L1 disk ht on xray 1-12  . COPD (chronic obstructive pulmonary disease) (HCC)   . Diabetes mellitus    w/ neuropathy  . Hypertension   . Smoker    Past Surgical History: No past surgical history on file. Social History: Social History   Socioeconomic History  . Marital status: Single    Spouse name: Not on file  . Number of children: Not on file  . Years of education: Not on file  . Highest education level: Not on file  Occupational History  . Not on file  Social Needs  . Financial resource strain: Not on file  . Food insecurity:    Worry: Not on file    Inability: Not on file  . Transportation needs:    Medical: Not on file    Non-medical: Not on file  Tobacco Use  . Smoking status: Former Smoker    Packs/day: 1.00    Years: 20.00    Pack years: 20.00    Types: Cigarettes    Last attempt to quit: 2013    Years since quitting: 6.2  . Smokeless tobacco: Never Used  Substance and Sexual Activity  . Alcohol use: No  . Drug use: No  . Sexual activity: Yes  Lifestyle  . Physical activity:    Days per week:  Not on file    Minutes per session: Not on file  . Stress: Not on file  Relationships  . Social connections:    Talks on phone: Not on file    Gets together: Not on file    Attends religious service: Not on file    Active member of club or organization: Not on file    Attends meetings of clubs or organizations: Not on file    Relationship status: Not on file  Other Topics Concern  . Not on file  Social History Narrative  . Not on file   Family History: Family History  Problem Relation Age of Onset  . Diabetes Mother   . Alcohol abuse Father        Liver disease killed father @ age 16.  Marland Kitchen Alcohol abuse Brother   . Heart attack Brother        Died at age 87.   Allergies: Allergies  Allergen Reactions  . Amaryl [Glimepiride] Other (See Comments)    Frequent hypoglycemia  . Atorvastatin Other (See Comments)    HA   . Cymbalta [Duloxetine Hcl] Other (See Comments)    Dizziness, insomnia  . Glipizide Other (See Comments)    HA  . Losartan Other (See Comments)  Vision changes, HA.   Marland Kitchen. Lyrica [Pregabalin] Other (See Comments)    Sedation  . Quinapril Cough  . Simvastatin Other (See Comments)    Severe myalgias.     Medications: See med rec.  Review of Systems: No fevers, chills, night sweats, weight loss, chest pain, or shortness of breath.   Objective:    General: Well Developed, well nourished, and in no acute distress.  Neuro: Alert and oriented x3, extra-ocular muscles intact, sensation grossly intact.  HEENT: Normocephalic, atraumatic, pupils equal round reactive to light, neck supple, no masses, no lymphadenopathy, thyroid nonpalpable.  Skin: Warm and dry, no rashes. Cardiac: Regular rate and rhythm, no murmurs rubs or gallops, no lower extremity edema.  Respiratory: Clear to auscultation bilaterally. Not using accessory muscles, speaking in full sentences.  Impression and Recommendations:    Diabetic peripheral neuropathy (HCC) Worsening neuropathic  pain. Diabetes is uncontrolled with an A1c over 10. I did explain to him that we would never be able to control his neuropathy as long as his diabetes was uncontrolled. Increasing gabapentin to 800 mg 4 times per day, adding alpha lipoic acid, vitamin B6. Continue tramadol as needed, continue Effexor at 225 mg, continue amitriptyline. He did have a fall, ultimately we may need to back off some of his neuropathics if this continues. Return in a month.  I spent 25 minutes with this patient, greater than 50% was face-to-face time counseling regarding the above diagnoses ___________________________________________ Ihor Austinhomas J. Benjamin Stainhekkekandam, M.D., ABFM., CAQSM. Primary Care and Sports Medicine [place] [facility]  Adjunct Instructor of Family Medicine  [employer]

## 2017-07-01 ENCOUNTER — Other Ambulatory Visit: Payer: Self-pay | Admitting: Family Medicine

## 2017-07-13 ENCOUNTER — Ambulatory Visit: Payer: Medicare HMO | Admitting: Family Medicine

## 2017-07-28 ENCOUNTER — Ambulatory Visit (INDEPENDENT_AMBULATORY_CARE_PROVIDER_SITE_OTHER): Payer: Medicare HMO | Admitting: Sports Medicine

## 2017-07-28 ENCOUNTER — Encounter: Payer: Self-pay | Admitting: Sports Medicine

## 2017-07-28 DIAGNOSIS — M48061 Spinal stenosis, lumbar region without neurogenic claudication: Secondary | ICD-10-CM

## 2017-07-28 NOTE — Progress Notes (Signed)
Subjective:    CC: Follow-up  HPI: Justin Goodman returns, he is a 62 year old male with severe and chronic back pain, we have tried multiple nonsurgical modalities including physical therapy, steroids, neuropathic agents including gabapentin at high dose, amitriptyline, NSAIDs.  He has had epidural injections, none of the above has provided him with sufficient or long lasting relief.  He does have some central canal stenosis that is moderate to severe at L4-L5 and pain in the buttocks and thighs.  At this point tramadol was also ineffective and he is agreeable to discuss a surgical solution.  No bowel or bladder dysfunction, saddle numbness, constitutional symptoms.  I reviewed the past medical history, family history, social history, surgical history, and allergies today and no changes were needed.  Please see the problem list section below in epic for further details.  Past Medical History: Past Medical History:  Diagnosis Date  . Chronic back pain    only mild loss of T12-L1 disk ht on xray 1-12  . COPD (chronic obstructive pulmonary disease) (HCC)   . Diabetes mellitus    w/ neuropathy  . Hypertension   . Smoker    Past Surgical History: No past surgical history on file. Social History: Social History   Socioeconomic History  . Marital status: Single    Spouse name: Not on file  . Number of children: Not on file  . Years of education: Not on file  . Highest education level: Not on file  Occupational History  . Not on file  Social Needs  . Financial resource strain: Not on file  . Food insecurity:    Worry: Not on file    Inability: Not on file  . Transportation needs:    Medical: Not on file    Non-medical: Not on file  Tobacco Use  . Smoking status: Former Smoker    Packs/day: 1.00    Years: 20.00    Pack years: 20.00    Types: Cigarettes    Last attempt to quit: 2013    Years since quitting: 6.3  . Smokeless tobacco: Never Used  Substance and Sexual Activity  .  Alcohol use: No  . Drug use: No  . Sexual activity: Yes  Lifestyle  . Physical activity:    Days per week: Not on file    Minutes per session: Not on file  . Stress: Not on file  Relationships  . Social connections:    Talks on phone: Not on file    Gets together: Not on file    Attends religious service: Not on file    Active member of club or organization: Not on file    Attends meetings of clubs or organizations: Not on file    Relationship status: Not on file  Other Topics Concern  . Not on file  Social History Narrative  . Not on file   Family History: Family History  Problem Relation Age of Onset  . Diabetes Mother   . Alcohol abuse Father        Liver disease killed father @ age 64.  Marland Kitchen. Alcohol abuse Brother   . Heart attack Brother        Died at age 64.   Allergies: Allergies  Allergen Reactions  . Amaryl [Glimepiride] Other (See Comments)    Frequent hypoglycemia  . Atorvastatin Other (See Comments)    HA   . Cymbalta [Duloxetine Hcl] Other (See Comments)    Dizziness, insomnia  . Glipizide Other (See Comments)  HA  . Losartan Other (See Comments)    Vision changes, HA.   Marland Kitchen Lyrica [Pregabalin] Other (See Comments)    Sedation  . Quinapril Cough  . Simvastatin Other (See Comments)    Severe myalgias.     Medications: See med rec.  Review of Systems: No fevers, chills, night sweats, weight loss, chest pain, or shortness of breath.   Objective:    General: Well Developed, well nourished, and in no acute distress.  Neuro: Alert and oriented x3, extra-ocular muscles intact, sensation grossly intact.  HEENT: Normocephalic, atraumatic, pupils equal round reactive to light, neck supple, no masses, no lymphadenopathy, thyroid nonpalpable.  Skin: Warm and dry, no rashes. Cardiac: Regular rate and rhythm, no murmurs rubs or gallops, no lower extremity edema.  Respiratory: Clear to auscultation bilaterally. Not using accessory muscles, speaking in full  sentences.  Impression and Recommendations:    Spinal stenosis of lumbar region Persistent and severe pain, back, posterior thighs consistent with spinal stenosis. He is on high-dose gabapentin, amitriptyline, we have tried NSAIDs, tramadol, physical therapy, epidural injections. At this point he has not had an improvement in his symptoms, he does have moderate to severe central canal stenosis, multifactorial due to disc protrusion, ligamentum flavum hypertrophy and facet joint hypertrophy at the L4-L5 level. At this point I am going to set him up with pain management as well as neurosurgery. He will probably need a new L spine MRI.  I spent 25 minutes with this patient, greater than 50% was face-to-face time counseling regarding the above diagnoses ___________________________________________ Ihor Austin. Benjamin Stain, M.D., ABFM., CAQSM. Primary Care and Sports Medicine [facility]  Adjunct Instructor of Family Medicine  [employer]

## 2017-07-28 NOTE — Assessment & Plan Note (Signed)
Persistent and severe pain, back, posterior thighs consistent with spinal stenosis. He is on high-dose gabapentin, amitriptyline, we have tried NSAIDs, tramadol, physical therapy, epidural injections. At this point he has not had an improvement in his symptoms, he does have moderate to severe central canal stenosis, multifactorial due to disc protrusion, ligamentum flavum hypertrophy and facet joint hypertrophy at the L4-L5 level. At this point I am going to set him up with pain management as well as neurosurgery. He will probably need a new L spine MRI.

## 2017-07-29 NOTE — Progress Notes (Signed)
Subjective:    CC:   HPI:  62 year old male with uncontrolled diabetes comes in today to discuss his persistent pain.  He has been diagnosed with diabetic neuropathy in addition to spinal stenosis of the lumbar spine.  Is been working with our sports medicine doctor on conservative therapy for his back.  Unfortunately he continues to have severe and persistent pain in his back and posterior thighs.  He has been unable to go to the gym for almost 6 months and was going very regularly up until then.  He has tried several medications including high-dose gabapentin, amitriptyline, NSAIDs, tramadol, physical therapy and even epidural injections without significant improvement.  At this point he is being referred to neurosurgery to discuss possible intervention as well as to pain management.   Diabetes - no hypoglycemic events. No wounds or sores that are not healing well. No increased thirst or urination. Checking glucose at home. Taking medications as prescribed without any side effects.  We will increase his Toujeo to 38 units when I last saw him in January.  Hemoglobin A1c was around 10 at that point time.  Hypertension- Pt denies chest pain, SOB, dizziness, or heart palpitations.  Taking meds as directed w/o problems.  Denies medication side effects.     Past medical history, Surgical history, Family history not pertinant except as noted below, Social history, Allergies, and medications have been entered into the medical record, reviewed, and corrections made.   Review of Systems: No fevers, chills, night sweats, weight loss, chest pain, or shortness of breath.   Objective:    General: Well Developed, well nourished, and in no acute distress.  Neuro: Alert and oriented x3, extra-ocular muscles intact, sensation grossly intact.  HEENT: Normocephalic, atraumatic  Skin: Warm and dry, no rashes. Cardiac: Regular rate and rhythm, no murmurs rubs or gallops, no lower extremity edema.  Respiratory:  Clear to auscultation bilaterally. Not using accessory muscles, speaking in full sentences.   Impression and Recommendations:     Spinal stenosis with claudication-referral already in place for neurosurgery.  Information has been sent just awaiting neurosurgery's approval.  Explained to him that they have to review his case to make sure they feel they can help him before they will accept him as a patient and make him an appointment.  In the meantime we discussed options for pain control.  He has been on narcotic pain medication, specifically hydrocodone in the past but really was not very effective and so we just came off of it.  We will try oxycodone instead.  We will start with twice a day dosing #60 for 1 month.  Pain contract signed.  We will start with this until he is able to get in with pain management as well.  If this is helpful actually like to try to wean some of his other medications such as his gabapentin down.  I do feel like causes excess fatigue and sedation and may even be increasing his fall risk as he had a few falls more recently.  Diabetes-uncontrolled though improved.  Hemoglobin A1c down to 9.7.  Since he is not having any lows we will increase his Toujeo to 38 units.  I want him to call back in about 2 weeks and let us know his blood sugar numbers are either bring in a copy or a log so that I can continue to make adjustments.  He really may be the point where we need to add mealtime insulin.  He is actually interested  in an insulin pump and wanted to know if he could have a referral to have this as an option.  He is also not exercising because of his spinal stenosis.  Hypertension- Well controlled. Continue current regimen. Follow up in  3 months.

## 2017-07-30 ENCOUNTER — Ambulatory Visit (INDEPENDENT_AMBULATORY_CARE_PROVIDER_SITE_OTHER): Payer: Medicare HMO | Admitting: Family Medicine

## 2017-07-30 ENCOUNTER — Encounter: Payer: Self-pay | Admitting: Family Medicine

## 2017-07-30 VITALS — BP 120/58 | HR 84 | Ht 69.0 in | Wt 166.0 lb

## 2017-07-30 DIAGNOSIS — E1142 Type 2 diabetes mellitus with diabetic polyneuropathy: Secondary | ICD-10-CM | POA: Diagnosis not present

## 2017-07-30 DIAGNOSIS — E1165 Type 2 diabetes mellitus with hyperglycemia: Secondary | ICD-10-CM | POA: Diagnosis not present

## 2017-07-30 DIAGNOSIS — M48061 Spinal stenosis, lumbar region without neurogenic claudication: Secondary | ICD-10-CM

## 2017-07-30 LAB — POCT GLYCOSYLATED HEMOGLOBIN (HGB A1C): HEMOGLOBIN A1C: 9.7

## 2017-07-30 MED ORDER — OXYCODONE-ACETAMINOPHEN 5-325 MG PO TABS: 1.0000 | ORAL_TABLET | Freq: Two times a day (BID) | ORAL | 0 refills | Status: DC | PRN

## 2017-07-30 NOTE — Patient Instructions (Signed)
Increase your Toujeo on 38 units each night Please call me in 2 weeks and let me know what your blood sugars are doing so I can make another adjustment to your blood sugars.  Or you can write them down and drop them off and I can take a look at them.  We should hear back on your neurosurgery referral by Monday or Tuesday of next week.  They have all your information and they are currently reviewing it.

## 2017-08-11 DIAGNOSIS — M48062 Spinal stenosis, lumbar region with neurogenic claudication: Secondary | ICD-10-CM | POA: Diagnosis not present

## 2017-08-17 ENCOUNTER — Other Ambulatory Visit: Payer: Self-pay | Admitting: Neurosurgery

## 2017-08-17 ENCOUNTER — Ambulatory Visit (INDEPENDENT_AMBULATORY_CARE_PROVIDER_SITE_OTHER): Payer: Medicare HMO

## 2017-08-17 ENCOUNTER — Other Ambulatory Visit: Payer: Self-pay | Admitting: *Deleted

## 2017-08-17 DIAGNOSIS — M48062 Spinal stenosis, lumbar region with neurogenic claudication: Secondary | ICD-10-CM

## 2017-08-17 MED ORDER — METFORMIN HCL 1000 MG PO TABS: 1000.0000 mg | ORAL_TABLET | Freq: Two times a day (BID) | ORAL | 3 refills | Status: DC

## 2017-08-24 ENCOUNTER — Other Ambulatory Visit: Payer: Medicare HMO

## 2017-08-25 DIAGNOSIS — M48062 Spinal stenosis, lumbar region with neurogenic claudication: Secondary | ICD-10-CM | POA: Diagnosis not present

## 2017-08-25 DIAGNOSIS — R030 Elevated blood-pressure reading, without diagnosis of hypertension: Secondary | ICD-10-CM | POA: Diagnosis not present

## 2017-09-03 ENCOUNTER — Other Ambulatory Visit: Payer: Self-pay | Admitting: Neurosurgery

## 2017-09-04 ENCOUNTER — Other Ambulatory Visit: Payer: Self-pay | Admitting: Family Medicine

## 2017-09-10 ENCOUNTER — Encounter: Payer: Self-pay | Admitting: Family Medicine

## 2017-09-10 ENCOUNTER — Ambulatory Visit: Payer: Medicare HMO | Admitting: Sports Medicine

## 2017-09-10 ENCOUNTER — Ambulatory Visit (INDEPENDENT_AMBULATORY_CARE_PROVIDER_SITE_OTHER): Payer: Medicare HMO | Admitting: Family Medicine

## 2017-09-10 VITALS — BP 123/71 | HR 78 | Ht 69.0 in | Wt 167.0 lb

## 2017-09-10 DIAGNOSIS — M25512 Pain in left shoulder: Secondary | ICD-10-CM

## 2017-09-10 DIAGNOSIS — M48061 Spinal stenosis, lumbar region without neurogenic claudication: Secondary | ICD-10-CM

## 2017-09-10 DIAGNOSIS — E1165 Type 2 diabetes mellitus with hyperglycemia: Secondary | ICD-10-CM

## 2017-09-10 MED ORDER — HYDROCODONE-ACETAMINOPHEN 5-325 MG PO TABS: 1.0000 | ORAL_TABLET | Freq: Two times a day (BID) | ORAL | 0 refills | Status: DC | PRN

## 2017-09-10 NOTE — Progress Notes (Signed)
Subjective:    Patient ID: Justin Goodman, male    DOB: Mar 22, 1956, 62 y.o.   MRN: [id number]  HPI 62 year old male comes in today to follow-up for his diabetes.  He did finally see the neurosurgeon for his chronic back pain and lower extremity weakness.  He is actually scheduled for a lumbar laminectomy of L4-L5 on June 21.  He did try the oxycodone but said it was extremely sedating and made him not want to get out of his house so he does not want to take it again. It made him feel down.   Diabetes-uncontrolled.  Last hemoglobin A1c was 9.4.  I did increase his Toujeo to 38 units to try to get his blood sugars under control.  He tends to fluctuate and have hypoglycemic events so did not want to make a big change with his blood sugars.  He was supposed to call in a couple weeks and let us know how things were going but we have not heard from him.  Hypertension- Pt denies chest pain, SOB, dizziness, or heart palpitations.  Taking meds as directed w/o problems.  Denies medication side effects.    He actually reports that he fell about 4 weeks ago and landed on the outer side of his left shoulder and scraped his elbow.  Is it has been painful and weak ever since then.  In fact he would like something for pain.  He is only able to extend to about 90 degrees with that arm.  He had a red mark on his shoulder after the fall but no significant bruising or swelling.  Review of Systems  BP 123/71   Pulse 78   Ht 5' 9"  (1.753 m)   Wt 167 lb (75.8 kg)   SpO2 98%   BMI 24.66 kg/m     Allergies  Allergen Reactions  . Amaryl [Glimepiride] Other (See Comments)    Frequent hypoglycemia  . Atorvastatin Other (See Comments)    HA   . Cymbalta [Duloxetine Hcl] Other (See Comments)    Dizziness, insomnia  . Glipizide Other (See Comments)    HA  . Losartan Other (See Comments)    Vision changes, HA.   Marland Kitchen Lyrica [Pregabalin] Other (See Comments)    Sedation  . Quinapril Cough  . Simvastatin Other  (See Comments)    Severe myalgias.      Past Medical History:  Diagnosis Date  . Chronic back pain    only mild loss of T12-L1 disk ht on xray 1-12  . COPD (chronic obstructive pulmonary disease) ([place])   . Diabetes mellitus    w/ neuropathy  . Hypertension   . Smoker     No past surgical history on file.  Social History   Socioeconomic History  . Marital status: Single    Spouse name: Not on file  . Number of children: Not on file  . Years of education: Not on file  . Highest education level: Not on file  Occupational History  . Not on file  Social Needs  . Financial resource strain: Not on file  . Food insecurity:    Worry: Not on file    Inability: Not on file  . Transportation needs:    Medical: Not on file    Non-medical: Not on file  Tobacco Use  . Smoking status: Former Smoker    Packs/day: 1.00    Years: 20.00    Pack years: 20.00    Types: Cigarettes  Last attempt to quit: 2013    Years since quitting: 6.4  . Smokeless tobacco: Never Used  Substance and Sexual Activity  . Alcohol use: No  . Drug use: No  . Sexual activity: Yes  Lifestyle  . Physical activity:    Days per week: Not on file    Minutes per session: Not on file  . Stress: Not on file  Relationships  . Social connections:    Talks on phone: Not on file    Gets together: Not on file    Attends religious service: Not on file    Active member of club or organization: Not on file    Attends meetings of clubs or organizations: Not on file    Relationship status: Not on file  . Intimate partner violence:    Fear of current or ex partner: Not on file    Emotionally abused: Not on file    Physically abused: Not on file    Forced sexual activity: Not on file  Other Topics Concern  . Not on file  Social History Narrative  . Not on file    Family History  Problem Relation Age of Onset  . Diabetes Mother   . Alcohol abuse Father        Liver disease killed father @ age 52.  Marland Kitchen  Alcohol abuse Brother   . Heart attack Brother        Died at age 67.    Outpatient Encounter Medications as of 09/10/2017  Medication Sig  . ACCU-CHEK FASTCLIX LANCETS MISC Check blood glucose 4 times daily Diagnosis diabetes E11.40  . Alcohol Swabs (B-D SINGLE USE SWABS REGULAR) PADS Check blood glucose 4 times daily Diagnosis diabetes E11.40  . amitriptyline (ELAVIL) 50 MG tablet Take 1 tablet (50 mg total) by mouth at bedtime.  Marland Kitchen aspirin EC 81 MG tablet Take 1 tablet (81 mg total) by mouth daily.  . B-D ULTRAFINE III SHORT PEN 31G X 8 MM MISC USE FOR DAILY INSULIN INJECTION AS DIRECTED BY PHYSICIAN  . Blood Glucose Monitoring Suppl (ACCU-CHEK NANO SMARTVIEW) w/Device KIT Check blood glucose 4 times daily Diagnosis diabetes E11.40  . gabapentin (NEURONTIN) 800 MG tablet Take 2 tablets (1,600 mg total) by mouth 2 (two) times daily.  Marland Kitchen HYDROcodone-acetaminophen (NORCO/VICODIN) 5-325 MG tablet Take 1 tablet by mouth 2 (two) times daily as needed for moderate pain.  Marland Kitchen losartan (COZAAR) 50 MG tablet Take 1 tablet (50 mg total) by mouth daily.  . metFORMIN (GLUCOPHAGE) 1000 MG tablet Take 1 tablet (1,000 mg total) by mouth 2 (two) times daily with a meal.  . metoprolol tartrate (LOPRESSOR) 50 MG tablet Take 0.5 tablets (25 mg total) by mouth 2 (two) times daily.  Marland Kitchen omeprazole (PRILOSEC) 40 MG capsule TAKE 1 CAPSULE BY MOUTH ONCE DAILY  . TOUJEO SOLOSTAR 300 UNIT/ML SOPN Inject 32 Units into the skin daily.  . traMADol (ULTRAM) 50 MG tablet 1 tabs by mouth Q8 hours, maximum 6 tabs per day.  . traZODone (DESYREL) 150 MG tablet Take 1 tablet (150 mg total) by mouth at bedtime.  Marland Kitchen umeclidinium bromide (INCRUSE ELLIPTA) 62.5 MCG/INH AEPB Inhale 1 puff into the lungs daily.  . [DISCONTINUED] oxyCODONE-acetaminophen (PERCOCET/ROXICET) 5-325 MG tablet Take 1 tablet by mouth 2 (two) times daily as needed for severe pain.   No facility-administered encounter medications on file as of 09/10/2017.           Objective:   Physical Exam  Constitutional: He is oriented  to person, place, and time. He appears well-developed and well-nourished.  HENT:  Head: Normocephalic and atraumatic.  Cardiovascular: Normal rate, regular rhythm and normal heart sounds.  Pulmonary/Chest: Effort normal and breath sounds normal.  Musculoskeletal:  Left shoulder is nontender on exam.  He is unable to extend his shoulder past 92 degrees.  He is extremely limited with external rotation to only about 20 degrees.  He is able to reach behind his back but is not able to lift his hand off of his back.  And radiates over the upper outer shoulder down to his upper arm with these motions.  Positive empty can test on the left compared to the right.  Neurological: He is alert and oriented to person, place, and time.  Skin: Skin is warm and dry.  Psychiatric: He has a normal mood and affect. His behavior is normal.        Assessment & Plan:  DM, uncontrolled -  Increase Toujeo to 40 units daily for 2 weeks.  If at that point blood sugars are still greater than 150 been increased to 42 units daily.  Lumbar stenosis-scheduled for laminectomy June 21.  HTN - Well controlled. Continue current regimen. Follow up in  6 months.    Left shoulder pain-I am concerned that he has likely torn his rotator cuff but will start with plain film x-ray just to make sure that he has not fractured anything.  We will try hydrocodone for pain relief for his shoulder and for his low back since the oxycodone made him feel down and sedated.  I explained to him that pain medicine will not 100% take away his pain but should help reduce it.  He will likely need an MRI if his x-ray is normal but he thinks his insurance company will not pay for an additional MRI.

## 2017-09-10 NOTE — Patient Instructions (Signed)
Increase Toujeo to 40 units daily for 2 weeks.  If at that point blood sugars are still greater than 150 been increased to 42 units daily.

## 2017-09-17 ENCOUNTER — Other Ambulatory Visit: Payer: Self-pay | Admitting: Neurosurgery

## 2017-09-17 NOTE — Progress Notes (Addendum)
PCP: Nani Gasseratherine Metheney, MD  Cardiologist: pt denies  EKG: 02/27/17 in Care Everywhere-[facility]  Stress test: pt denies  ECHO: 08/27/16 in [facility]  Cardiac Cath: pt denies ever  Chest x-ray: pt denies, no recent respiratory infection/complication  Patient reports that Dr. Franky Machoabbell told him it was ok to continue aspirin 81 mg until surgery

## 2017-09-17 NOTE — Pre-Procedure Instructions (Signed)
Justin Goodman  09/17/2017      [id number] - [place], [place] - [address]  [place] [id number] Phone: [phone] Fax: [phone]  [facility] - [place] - [address] [place] [id number] Phone: [phone] Fax: [phone]    Your procedure is scheduled on September 25, 2017.  [facility] Admitting at 0700 AM.  Call this number if you have problems the morning of surgery:  [phone]   Remember:  Do not eat or drink after midnight.   Continue all medications as directed by your physician except follow these medication instructions before surgery below  Take these medicines the morning of surgery with A SIP OF WATER  Metoprolol tartrate (lopressor) Omeprazole (prilosec) Gabapentin (neurontin) Incruse Ellipta inhaler (if needed)-bring inhaler with you) Tramadol (ultram)-If needed for pain Hydrocodone-acetaminophen (norco)-if needed for more severe pain  Follow your surgeon's instructions on when to hold/resume aspirin.  If no instructions were given call the office to determine how they would like to you take aspirin  7 days prior to surgery STOP taking any Aleve, Naproxen, Ibuprofen, Motrin, Advil, Goody's, BC's, all herbal medications, fish oil, and all vitamins  WHAT DO I DO ABOUT MY DIABETES MEDICATION?  Marland Kitchen. Do not take oral diabetes medicines (pills) the morning of surgery. METFORMIN      . THE MORNING OF SURGERY, take 19 units of Toujeo insulin (1/2 of your normal dose).  . The day of surgery, do not take other diabetes injectables, including Byetta (exenatide), Bydureon (exenatide ER), Victoza (liraglutide), or Trulicity (dulaglutide).   Reviewed and Endorsed by [facility] Patient Education Committee, August 2015   How to Manage Your Diabetes Before and After Surgery  Why is it important to control my blood sugar before and after  surgery? . Improving blood sugar levels before and after surgery helps healing and can limit problems. . A way of improving blood sugar control is eating a healthy diet by: o  Eating less sugar and carbohydrates o  Increasing activity/exercise o  Talking with your doctor about reaching your blood sugar goals . High blood sugars (greater than 180 mg/dL) can raise your risk of infections and slow your recovery, so you will need to focus on controlling your diabetes during the weeks before surgery. . Make sure that the doctor who takes care of your diabetes knows about your planned surgery including the date and location.  How do I manage my blood sugar before surgery? . Check your blood sugar at least 4 times a day, starting 2 days before surgery, to make sure that the level is not too high or low. o Check your blood sugar the morning of your surgery when you wake up and every 2 hours until you get to the Short Stay unit. . If your blood sugar is less than 70 mg/dL, you will need to treat for low blood sugar: o Do not take insulin. o Treat a low blood sugar (less than 70 mg/dL) with  cup of clear juice (cranberry or apple), 4 glucose tablets, OR glucose gel. Recheck blood sugar in 15 minutes after treatment (to make sure it is greater than 70 mg/dL). If your blood sugar is not greater than 70 mg/dL on recheck, call [phone] o  for further instructions. . Report your blood sugar to the short stay nurse when you get to Short Stay.  . If you are admitted  to the hospital after surgery: o Your blood sugar will be checked by the staff and you will probably be given insulin after surgery (instead of oral diabetes medicines) to make sure you have good blood sugar levels. o The goal for blood sugar control after surgery is 80-180 mg/dL.    Do not wear jewelry, make-up or nail polish.  Do not wear lotions, powders, or perfumes, or deodorant.  Do not shave 48 hours prior to surgery.  Men may shave  face and neck.  Do not bring valuables to the hospital.  [facility] is not responsible for any belongings or valuables.  Contacts, dentures or bridgework may not be worn into surgery.  Leave your suitcase in the car.  After surgery it may be brought to your room.  For patients admitted to the hospital, discharge time will be determined by your treatment team.  Patients discharged the day of surgery will not be allowed to drive home.    [place]- Preparing For Surgery  Before surgery, you can play an important role. Because skin is not sterile, your skin needs to be as free of germs as possible. You can reduce the number of germs on your skin by washing with CHG (chlorahexidine gluconate) Soap before surgery.  CHG is an antiseptic cleaner which kills germs and bonds with the skin to continue killing germs even after washing.    Oral Hygiene is also important to reduce your risk of infection.  Remember - BRUSH YOUR TEETH THE MORNING OF SURGERY WITH YOUR REGULAR TOOTHPASTE  Please do not use if you have an allergy to CHG or antibacterial soaps. If your skin becomes reddened/irritated stop using the CHG.  Do not shave (including legs and underarms) for at least 48 hours prior to first CHG shower. It is OK to shave your face.  Please follow these instructions carefully.   1. Shower the NIGHT BEFORE SURGERY and the MORNING OF SURGERY with CHG.   2. If you chose to wash your hair, wash your hair first as usual with your normal shampoo.  3. After you shampoo, rinse your hair and body thoroughly to remove the shampoo.  4. Use CHG as you would any other liquid soap. You can apply CHG directly to the skin and wash gently with a scrungie or a clean washcloth.   5. Apply the CHG Soap to your body ONLY FROM THE NECK DOWN.  Do not use on open wounds or open sores. Avoid contact with your eyes, ears, mouth and genitals (private parts). Wash Face and genitals (private parts)  with your normal  soap.  6. Wash thoroughly, paying special attention to the area where your surgery will be performed.  7. Thoroughly rinse your body with warm water from the neck down.  8. DO NOT shower/wash with your normal soap after using and rinsing off the CHG Soap.  9. Pat yourself dry with a CLEAN TOWEL.  10. Wear CLEAN PAJAMAS to bed the night before surgery, wear comfortable clothes the morning of surgery  11. Place CLEAN SHEETS on your bed the night of your first shower and DO NOT SLEEP WITH PETS.   Day of Surgery:  Do not apply any deodorants/lotions.  Please wear clean clothes to the hospital/surgery center.   Remember to brush your teeth WITH YOUR REGULAR TOOTHPASTE.  Please read over the following fact sheets that you were given. Pain Booklet, Coughing and Deep Breathing, MRSA Information and Surgical Site Infection Prevention

## 2017-09-18 ENCOUNTER — Other Ambulatory Visit: Payer: Self-pay

## 2017-09-18 ENCOUNTER — Encounter (HOSPITAL_COMMUNITY): Payer: Self-pay

## 2017-09-18 ENCOUNTER — Encounter (HOSPITAL_COMMUNITY)
Admission: RE | Admit: 2017-09-18 | Discharge: 2017-09-18 | Disposition: A | Discharge status: Home / Self Care | Payer: Medicare HMO | Source: Ambulatory Visit | Attending: Neurosurgery | Admitting: Neurosurgery

## 2017-09-18 DIAGNOSIS — Z01812 Encounter for preprocedural laboratory examination: Secondary | ICD-10-CM | POA: Insufficient documentation

## 2017-09-18 DIAGNOSIS — M48062 Spinal stenosis, lumbar region with neurogenic claudication: Secondary | ICD-10-CM | POA: Insufficient documentation

## 2017-09-18 LAB — CBC
HEMATOCRIT: 41.5 % (ref 39.0–52.0)
Hemoglobin: 13.8 g/dL (ref 13.0–17.0)
MCH: 28 pg (ref 26.0–34.0)
MCHC: 33.3 g/dL (ref 30.0–36.0)
MCV: 84.3 fL (ref 78.0–100.0)
Platelets: 148 10*3/uL — ABNORMAL LOW (ref 150–400)
RBC: 4.92 MIL/uL (ref 4.22–5.81)
RDW: 13.5 % (ref 11.5–15.5)
WBC: 2.9 10*3/uL — AB (ref 4.0–10.5)

## 2017-09-18 LAB — BASIC METABOLIC PANEL
ANION GAP: 5 (ref 5–15)
BUN: 7 mg/dL (ref 6–20)
CHLORIDE: 103 mmol/L (ref 101–111)
CO2: 30 mmol/L (ref 22–32)
Calcium: 9.6 mg/dL (ref 8.9–10.3)
Creatinine, Ser: 0.61 mg/dL (ref 0.61–1.24)
GFR calc Af Amer: >60 mL/min (ref >60)
Glucose, Bld: 208 mg/dL — ABNORMAL HIGH (ref 65–99)
POTASSIUM: 4.4 mmol/L (ref 3.5–5.1)
SODIUM: 138 mmol/L (ref 135–145)

## 2017-09-18 LAB — HEMOGLOBIN A1C
Hgb A1c MFr Bld: 8.6 % — ABNORMAL HIGH (ref 4.8–5.6)
MEAN PLASMA GLUCOSE: 200.12 mg/dL

## 2017-09-18 LAB — SURGICAL PCR SCREEN
MRSA, PCR: NEGATIVE
STAPHYLOCOCCUS AUREUS: NEGATIVE

## 2017-09-18 LAB — GLUCOSE, CAPILLARY: Glucose-Capillary: 248 mg/dL — ABNORMAL HIGH (ref 65–99)

## 2017-09-24 ENCOUNTER — Other Ambulatory Visit: Payer: Self-pay | Admitting: Family Medicine

## 2017-09-24 NOTE — Progress Notes (Signed)
Attempted to call [facility] disconnected Ekg slip left in chart

## 2017-09-25 ENCOUNTER — Ambulatory Visit (HOSPITAL_COMMUNITY): Payer: Medicare HMO | Admitting: Anesthesiology

## 2017-09-25 ENCOUNTER — Encounter (HOSPITAL_COMMUNITY)
Admission: RE | Disposition: A | Discharge status: Home / Self Care | Payer: Self-pay | Source: Ambulatory Visit | Attending: Neurosurgery

## 2017-09-25 ENCOUNTER — Ambulatory Visit (HOSPITAL_COMMUNITY): Payer: Medicare HMO | Admitting: Emergency Medicine

## 2017-09-25 ENCOUNTER — Encounter (HOSPITAL_COMMUNITY): Payer: Self-pay | Admitting: *Deleted

## 2017-09-25 ENCOUNTER — Ambulatory Visit (HOSPITAL_COMMUNITY): Payer: Medicare HMO

## 2017-09-25 ENCOUNTER — Ambulatory Visit (HOSPITAL_COMMUNITY)
Admission: RE | Admit: 2017-09-25 | Discharge: 2017-09-26 | Disposition: A | Discharge status: Home / Self Care | Payer: Medicare HMO | Source: Ambulatory Visit | Attending: Neurosurgery | Admitting: Neurosurgery

## 2017-09-25 DIAGNOSIS — N521 Erectile dysfunction due to diseases classified elsewhere: Secondary | ICD-10-CM | POA: Diagnosis not present

## 2017-09-25 DIAGNOSIS — Z833 Family history of diabetes mellitus: Secondary | ICD-10-CM | POA: Insufficient documentation

## 2017-09-25 DIAGNOSIS — E7801 Familial hypercholesterolemia: Secondary | ICD-10-CM | POA: Diagnosis not present

## 2017-09-25 DIAGNOSIS — E785 Hyperlipidemia, unspecified: Secondary | ICD-10-CM | POA: Diagnosis not present

## 2017-09-25 DIAGNOSIS — Z981 Arthrodesis status: Secondary | ICD-10-CM | POA: Diagnosis not present

## 2017-09-25 DIAGNOSIS — M48062 Spinal stenosis, lumbar region with neurogenic claudication: Secondary | ICD-10-CM | POA: Diagnosis present

## 2017-09-25 DIAGNOSIS — Z794 Long term (current) use of insulin: Secondary | ICD-10-CM | POA: Insufficient documentation

## 2017-09-25 DIAGNOSIS — Z79899 Other long term (current) drug therapy: Secondary | ICD-10-CM | POA: Insufficient documentation

## 2017-09-25 DIAGNOSIS — Z888 Allergy status to other drugs, medicaments and biological substances status: Secondary | ICD-10-CM | POA: Insufficient documentation

## 2017-09-25 DIAGNOSIS — Z419 Encounter for procedure for purposes other than remedying health state, unspecified: Secondary | ICD-10-CM

## 2017-09-25 DIAGNOSIS — I10 Essential (primary) hypertension: Secondary | ICD-10-CM | POA: Diagnosis not present

## 2017-09-25 HISTORY — PX: LUMBAR LAMINECTOMY/DECOMPRESSION MICRODISCECTOMY: SHX5026

## 2017-09-25 LAB — GLUCOSE, CAPILLARY
GLUCOSE-CAPILLARY: 111 mg/dL — AB (ref 65–99)
GLUCOSE-CAPILLARY: 100 mg/dL — AB (ref 65–99)
GLUCOSE-CAPILLARY: 222 mg/dL — AB (ref 65–99)
Glucose-Capillary: 195 mg/dL — ABNORMAL HIGH (ref 65–99)
Glucose-Capillary: 379 mg/dL — ABNORMAL HIGH (ref 65–99)

## 2017-09-25 SURGERY — LUMBAR LAMINECTOMY/DECOMPRESSION MICRODISCECTOMY 1 LEVEL
Anesthesia: General | Site: Spine Lumbar

## 2017-09-25 MED ORDER — LIDOCAINE-EPINEPHRINE 0.5 %-1:200000 IJ SOLN
INTRAMUSCULAR | Status: DC | PRN
  Administered 2017-09-25: 9 mL

## 2017-09-25 MED ORDER — FENTANYL CITRATE (PF) 250 MCG/5ML IJ SOLN
INTRAMUSCULAR | Status: DC | PRN
  Administered 2017-09-25: 100 ug via INTRAVENOUS
  Administered 2017-09-25: 50 ug via INTRAVENOUS
  Administered 2017-09-25 (×2): 25 ug via INTRAVENOUS

## 2017-09-25 MED ORDER — ONDANSETRON HCL 4 MG PO TABS: 4.0000 mg | ORAL_TABLET | Freq: Four times a day (QID) | ORAL | Status: DC | PRN

## 2017-09-25 MED ORDER — HEMOSTATIC AGENTS (NO CHARGE) OPTIME
TOPICAL | Status: DC | PRN
  Administered 2017-09-25: 1 via TOPICAL

## 2017-09-25 MED ORDER — TRAZODONE HCL 150 MG PO TABS
150.0000 mg | ORAL_TABLET | Freq: Every day | ORAL | Status: DC
  Administered 2017-09-25: 150 mg via ORAL
  Filled 2017-09-25: qty 1

## 2017-09-25 MED ORDER — OXYCODONE HCL 5 MG PO TABS
10.0000 mg | ORAL_TABLET | ORAL | Status: DC | PRN
  Administered 2017-09-25 – 2017-09-26 (×3): 10 mg via ORAL
  Filled 2017-09-25 (×3): qty 2

## 2017-09-25 MED ORDER — LACTATED RINGERS IV SOLN
INTRAVENOUS | Status: DC
  Administered 2017-09-25: 09:00:00 via INTRAVENOUS

## 2017-09-25 MED ORDER — MEPERIDINE HCL 50 MG/ML IJ SOLN: 6.2500 mg | INTRAMUSCULAR | Status: DC | PRN

## 2017-09-25 MED ORDER — ACETAMINOPHEN 10 MG/ML IV SOLN
INTRAVENOUS | Status: DC | PRN
  Administered 2017-09-25: 1000 mg via INTRAVENOUS

## 2017-09-25 MED ORDER — MENTHOL 3 MG MT LOZG: 1.0000 | LOZENGE | OROMUCOSAL | Status: DC | PRN

## 2017-09-25 MED ORDER — SODIUM CHLORIDE 0.9% FLUSH: 3.0000 mL | INTRAVENOUS | Status: DC | PRN

## 2017-09-25 MED ORDER — AMITRIPTYLINE HCL 50 MG PO TABS
50.0000 mg | ORAL_TABLET | Freq: Every day | ORAL | Status: DC
  Administered 2017-09-25: 50 mg via ORAL
  Filled 2017-09-25: qty 1

## 2017-09-25 MED ORDER — MORPHINE SULFATE (PF) 2 MG/ML IV SOLN: 2.0000 mg | INTRAVENOUS | Status: DC | PRN

## 2017-09-25 MED ORDER — THROMBIN 5000 UNITS EX SOLR
CUTANEOUS | Status: AC
  Filled 2017-09-25: qty 10000

## 2017-09-25 MED ORDER — DEXAMETHASONE SODIUM PHOSPHATE 10 MG/ML IJ SOLN
INTRAMUSCULAR | Status: AC
  Filled 2017-09-25: qty 1

## 2017-09-25 MED ORDER — ASPIRIN EC 81 MG PO TBEC: 81.0000 mg | DELAYED_RELEASE_TABLET | Freq: Every day | ORAL | Status: DC

## 2017-09-25 MED ORDER — MIDAZOLAM HCL 2 MG/2ML IJ SOLN
INTRAMUSCULAR | Status: DC | PRN
  Administered 2017-09-25: 2 mg via INTRAVENOUS

## 2017-09-25 MED ORDER — SUGAMMADEX SODIUM 200 MG/2ML IV SOLN
INTRAVENOUS | Status: DC | PRN
  Administered 2017-09-25: 200 mg via INTRAVENOUS

## 2017-09-25 MED ORDER — INSULIN ASPART 100 UNIT/ML ~~LOC~~ SOLN
0.0000 [IU] | Freq: Every day | SUBCUTANEOUS | Status: DC
  Administered 2017-09-25: 2 [IU] via SUBCUTANEOUS

## 2017-09-25 MED ORDER — METFORMIN HCL 500 MG PO TABS
1000.0000 mg | ORAL_TABLET | Freq: Two times a day (BID) | ORAL | Status: DC
  Administered 2017-09-25: 1000 mg via ORAL
  Filled 2017-09-25: qty 2

## 2017-09-25 MED ORDER — GABAPENTIN 800 MG PO TABS
1600.0000 mg | ORAL_TABLET | Freq: Two times a day (BID) | ORAL | Status: DC
  Filled 2017-09-25: qty 2

## 2017-09-25 MED ORDER — THROMBIN 5000 UNITS EX SOLR
CUTANEOUS | Status: DC | PRN
  Administered 2017-09-25 (×2): 5000 [IU] via TOPICAL

## 2017-09-25 MED ORDER — INSULIN ASPART 100 UNIT/ML ~~LOC~~ SOLN: 0.0000 [IU] | Freq: Three times a day (TID) | SUBCUTANEOUS | Status: DC

## 2017-09-25 MED ORDER — CHLORHEXIDINE GLUCONATE CLOTH 2 % EX PADS: 6.0000 | MEDICATED_PAD | Freq: Once | CUTANEOUS | Status: DC

## 2017-09-25 MED ORDER — PHENYLEPHRINE HCL 10 MG/ML IJ SOLN
INTRAMUSCULAR | Status: DC | PRN
  Administered 2017-09-25 (×2): 80 ug via INTRAVENOUS

## 2017-09-25 MED ORDER — PHENOL 1.4 % MT LIQD: 1.0000 | OROMUCOSAL | Status: DC | PRN

## 2017-09-25 MED ORDER — ONDANSETRON HCL 4 MG/2ML IJ SOLN
INTRAMUSCULAR | Status: AC
  Filled 2017-09-25: qty 4

## 2017-09-25 MED ORDER — LOSARTAN POTASSIUM 50 MG PO TABS
50.0000 mg | ORAL_TABLET | Freq: Every day | ORAL | Status: DC
  Administered 2017-09-25: 50 mg via ORAL
  Filled 2017-09-25: qty 1

## 2017-09-25 MED ORDER — PHENYLEPHRINE HCL 10 MG/ML IJ SOLN
INTRAVENOUS | Status: DC | PRN
  Administered 2017-09-25: 50 ug/min via INTRAVENOUS

## 2017-09-25 MED ORDER — SODIUM CHLORIDE 0.9% FLUSH
3.0000 mL | Freq: Two times a day (BID) | INTRAVENOUS | Status: DC
  Administered 2017-09-25 (×2): 3 mL via INTRAVENOUS

## 2017-09-25 MED ORDER — ACETAMINOPHEN 650 MG RE SUPP: 650.0000 mg | RECTAL | Status: DC | PRN

## 2017-09-25 MED ORDER — CEFAZOLIN SODIUM-DEXTROSE 2-4 GM/100ML-% IV SOLN
2.0000 g | INTRAVENOUS | Status: AC
  Administered 2017-09-25: 2 g via INTRAVENOUS
  Filled 2017-09-25: qty 100

## 2017-09-25 MED ORDER — UMECLIDINIUM BROMIDE 62.5 MCG/INH IN AEPB
1.0000 | INHALATION_SPRAY | Freq: Every day | RESPIRATORY_TRACT | Status: DC | PRN
  Filled 2017-09-25: qty 7

## 2017-09-25 MED ORDER — PROMETHAZINE HCL 25 MG/ML IJ SOLN: 6.2500 mg | INTRAMUSCULAR | Status: DC | PRN

## 2017-09-25 MED ORDER — KETOROLAC TROMETHAMINE 15 MG/ML IJ SOLN
15.0000 mg | Freq: Four times a day (QID) | INTRAMUSCULAR | Status: AC
  Administered 2017-09-25 – 2017-09-26 (×4): 15 mg via INTRAVENOUS
  Filled 2017-09-25 (×4): qty 1

## 2017-09-25 MED ORDER — PHENYLEPHRINE 40 MCG/ML (10ML) SYRINGE FOR IV PUSH (FOR BLOOD PRESSURE SUPPORT)
PREFILLED_SYRINGE | INTRAVENOUS | Status: AC
  Filled 2017-09-25: qty 10

## 2017-09-25 MED ORDER — METOPROLOL TARTRATE 25 MG PO TABS
25.0000 mg | ORAL_TABLET | Freq: Two times a day (BID) | ORAL | Status: DC
  Administered 2017-09-25: 25 mg via ORAL
  Filled 2017-09-25: qty 1

## 2017-09-25 MED ORDER — ACETAMINOPHEN 10 MG/ML IV SOLN
INTRAVENOUS | Status: AC
  Filled 2017-09-25: qty 100

## 2017-09-25 MED ORDER — ONDANSETRON HCL 4 MG/2ML IJ SOLN: 4.0000 mg | Freq: Four times a day (QID) | INTRAMUSCULAR | Status: DC | PRN

## 2017-09-25 MED ORDER — LIDOCAINE 2% (20 MG/ML) 5 ML SYRINGE
INTRAMUSCULAR | Status: AC
  Filled 2017-09-25: qty 5

## 2017-09-25 MED ORDER — HYDROCODONE-ACETAMINOPHEN 5-325 MG PO TABS: 1.0000 | ORAL_TABLET | Freq: Four times a day (QID) | ORAL | 0 refills | Status: AC | PRN

## 2017-09-25 MED ORDER — OXYCODONE HCL ER 10 MG PO T12A
10.0000 mg | EXTENDED_RELEASE_TABLET | Freq: Two times a day (BID) | ORAL | Status: DC
  Administered 2017-09-25 (×2): 10 mg via ORAL
  Filled 2017-09-25 (×2): qty 1

## 2017-09-25 MED ORDER — ALPHA-LIPOIC ACID 200 MG PO CAPS: 1.0000 | ORAL_CAPSULE | Freq: Every day | ORAL | Status: DC

## 2017-09-25 MED ORDER — SUGAMMADEX SODIUM 200 MG/2ML IV SOLN
INTRAVENOUS | Status: AC
  Filled 2017-09-25: qty 2

## 2017-09-25 MED ORDER — MIDAZOLAM HCL 2 MG/2ML IJ SOLN
INTRAMUSCULAR | Status: AC
  Filled 2017-09-25: qty 2

## 2017-09-25 MED ORDER — ZOLPIDEM TARTRATE 5 MG PO TABS: 5.0000 mg | ORAL_TABLET | Freq: Every evening | ORAL | Status: DC | PRN

## 2017-09-25 MED ORDER — LIDOCAINE 2% (20 MG/ML) 5 ML SYRINGE
INTRAMUSCULAR | Status: DC | PRN
  Administered 2017-09-25: 60 mg via INTRAVENOUS

## 2017-09-25 MED ORDER — GABAPENTIN 400 MG PO CAPS
1600.0000 mg | ORAL_CAPSULE | Freq: Two times a day (BID) | ORAL | Status: DC
  Administered 2017-09-25: 1600 mg via ORAL
  Filled 2017-09-25: qty 4

## 2017-09-25 MED ORDER — LIDOCAINE-EPINEPHRINE 0.5 %-1:200000 IJ SOLN
INTRAMUSCULAR | Status: AC
  Filled 2017-09-25: qty 1

## 2017-09-25 MED ORDER — POTASSIUM CHLORIDE IN NACL 20-0.9 MEQ/L-% IV SOLN: INTRAVENOUS | Status: DC

## 2017-09-25 MED ORDER — OXYCODONE HCL 5 MG PO TABS: 5.0000 mg | ORAL_TABLET | ORAL | Status: DC | PRN

## 2017-09-25 MED ORDER — PROPOFOL 10 MG/ML IV BOLUS
INTRAVENOUS | Status: DC | PRN
  Administered 2017-09-25: 150 mg via INTRAVENOUS

## 2017-09-25 MED ORDER — PANTOPRAZOLE SODIUM 40 MG PO TBEC: 80.0000 mg | DELAYED_RELEASE_TABLET | Freq: Every day | ORAL | Status: DC

## 2017-09-25 MED ORDER — PROPOFOL 10 MG/ML IV BOLUS
INTRAVENOUS | Status: AC
  Filled 2017-09-25: qty 20

## 2017-09-25 MED ORDER — ROCURONIUM BROMIDE 10 MG/ML (PF) SYRINGE
PREFILLED_SYRINGE | INTRAVENOUS | Status: DC | PRN
  Administered 2017-09-25: 50 mg via INTRAVENOUS

## 2017-09-25 MED ORDER — DIAZEPAM 5 MG PO TABS
5.0000 mg | ORAL_TABLET | Freq: Four times a day (QID) | ORAL | Status: DC | PRN
  Administered 2017-09-25: 5 mg via ORAL
  Filled 2017-09-25: qty 1

## 2017-09-25 MED ORDER — TIZANIDINE HCL 4 MG PO TABS: 4.0000 mg | ORAL_TABLET | Freq: Four times a day (QID) | ORAL | 0 refills | Status: DC | PRN

## 2017-09-25 MED ORDER — HYDROMORPHONE HCL 1 MG/ML IJ SOLN: 0.2500 mg | INTRAMUSCULAR | Status: DC | PRN

## 2017-09-25 MED ORDER — INSULIN GLARGINE 100 UNIT/ML ~~LOC~~ SOLN
38.0000 [IU] | Freq: Every day | SUBCUTANEOUS | Status: DC
  Filled 2017-09-25: qty 0.38

## 2017-09-25 MED ORDER — ACETAMINOPHEN 325 MG PO TABS
650.0000 mg | ORAL_TABLET | ORAL | Status: DC | PRN
  Administered 2017-09-25: 650 mg via ORAL
  Filled 2017-09-25: qty 2

## 2017-09-25 MED ORDER — ONDANSETRON HCL 4 MG/2ML IJ SOLN
INTRAMUSCULAR | Status: DC | PRN
  Administered 2017-09-25: 4 mg via INTRAVENOUS

## 2017-09-25 MED ORDER — DEXAMETHASONE SODIUM PHOSPHATE 10 MG/ML IJ SOLN
INTRAMUSCULAR | Status: DC | PRN
  Administered 2017-09-25: 5 mg via INTRAVENOUS

## 2017-09-25 MED ORDER — ROCURONIUM BROMIDE 50 MG/5ML IV SOLN
INTRAVENOUS | Status: AC
  Filled 2017-09-25: qty 1

## 2017-09-25 MED ORDER — 0.9 % SODIUM CHLORIDE (POUR BTL) OPTIME
TOPICAL | Status: DC | PRN
  Administered 2017-09-25: 1000 mL

## 2017-09-25 MED ORDER — VITAMIN B-6 100 MG PO TABS
100.0000 mg | ORAL_TABLET | Freq: Every day | ORAL | Status: DC
  Filled 2017-09-25: qty 1

## 2017-09-25 MED ORDER — FENTANYL CITRATE (PF) 250 MCG/5ML IJ SOLN
INTRAMUSCULAR | Status: AC
Start: 2017-09-25 — End: ?
  Filled 2017-09-25: qty 5

## 2017-09-25 SURGICAL SUPPLY — 49 items
BENZOIN TINCTURE PRP APPL 2/3 (GAUZE/BANDAGES/DRESSINGS) IMPLANT
BLADE CLIPPER SURG (BLADE) IMPLANT
BUR MATCHSTICK NEURO 3.0 LAGG (BURR) ×2 IMPLANT
BUR PRECISION FLUTE 5.0 (BURR) IMPLANT
CANISTER SUCT 3000ML PPV (MISCELLANEOUS) ×2 IMPLANT
CARTRIDGE OIL MAESTRO DRILL (MISCELLANEOUS) ×1 IMPLANT
DECANTER SPIKE VIAL GLASS SM (MISCELLANEOUS) ×2 IMPLANT
DERMABOND ADVANCED (GAUZE/BANDAGES/DRESSINGS) ×1
DERMABOND ADVANCED .7 DNX12 (GAUZE/BANDAGES/DRESSINGS) ×1 IMPLANT
DIFFUSER DRILL AIR PNEUMATIC (MISCELLANEOUS) ×2 IMPLANT
DRAPE LAPAROTOMY 100X72X124 (DRAPES) ×2 IMPLANT
DRAPE MICROSCOPE LEICA (MISCELLANEOUS) ×2 IMPLANT
DRAPE SURG 17X23 STRL (DRAPES) ×2 IMPLANT
DURAPREP 26ML APPLICATOR (WOUND CARE) ×2 IMPLANT
ELECT REM PT RETURN 9FT ADLT (ELECTROSURGICAL) ×2
ELECTRODE REM PT RTRN 9FT ADLT (ELECTROSURGICAL) ×1 IMPLANT
GAUZE SPONGE 4X4 12PLY STRL (GAUZE/BANDAGES/DRESSINGS) IMPLANT
GAUZE SPONGE 4X4 16PLY XRAY LF (GAUZE/BANDAGES/DRESSINGS) IMPLANT
GLOVE BIOGEL PI IND STRL 8 (GLOVE) ×4 IMPLANT
GLOVE BIOGEL PI INDICATOR 8 (GLOVE) ×4
GLOVE ECLIPSE 6.5 STRL STRAW (GLOVE) ×2 IMPLANT
GLOVE ECLIPSE 7.5 STRL STRAW (GLOVE) ×2 IMPLANT
GLOVE EXAM NITRILE LRG STRL (GLOVE) IMPLANT
GLOVE EXAM NITRILE XL STR (GLOVE) IMPLANT
GLOVE EXAM NITRILE XS STR PU (GLOVE) IMPLANT
GOWN STRL REUS W/ TWL LRG LVL3 (GOWN DISPOSABLE) ×1 IMPLANT
GOWN STRL REUS W/ TWL XL LVL3 (GOWN DISPOSABLE) ×1 IMPLANT
GOWN STRL REUS W/TWL 2XL LVL3 (GOWN DISPOSABLE) ×2 IMPLANT
GOWN STRL REUS W/TWL LRG LVL3 (GOWN DISPOSABLE) ×1
GOWN STRL REUS W/TWL XL LVL3 (GOWN DISPOSABLE) ×1
KIT BASIN OR (CUSTOM PROCEDURE TRAY) ×2 IMPLANT
KIT TURNOVER KIT B (KITS) ×2 IMPLANT
NEEDLE HYPO 25X1 1.5 SAFETY (NEEDLE) ×2 IMPLANT
NEEDLE SPNL 18GX3.5 QUINCKE PK (NEEDLE) ×2 IMPLANT
NS IRRIG 1000ML POUR BTL (IV SOLUTION) ×2 IMPLANT
OIL CARTRIDGE MAESTRO DRILL (MISCELLANEOUS) ×2
PACK LAMINECTOMY NEURO (CUSTOM PROCEDURE TRAY) ×2 IMPLANT
PAD ARMBOARD 7.5X6 YLW CONV (MISCELLANEOUS) ×6 IMPLANT
RUBBERBAND STERILE (MISCELLANEOUS) ×4 IMPLANT
SPONGE LAP 4X18 RFD (DISPOSABLE) IMPLANT
SPONGE SURGIFOAM ABS GEL SZ50 (HEMOSTASIS) ×2 IMPLANT
STRIP CLOSURE SKIN 1/2X4 (GAUZE/BANDAGES/DRESSINGS) IMPLANT
SUT VIC AB 0 CT1 18XCR BRD8 (SUTURE) ×1 IMPLANT
SUT VIC AB 0 CT1 8-18 (SUTURE) ×1
SUT VIC AB 2-0 CT1 18 (SUTURE) ×2 IMPLANT
SUT VIC AB 3-0 SH 8-18 (SUTURE) ×4 IMPLANT
TOWEL GREEN STERILE (TOWEL DISPOSABLE) ×2 IMPLANT
TOWEL GREEN STERILE FF (TOWEL DISPOSABLE) ×2 IMPLANT
WATER STERILE IRR 1000ML POUR (IV SOLUTION) ×2 IMPLANT

## 2017-09-25 NOTE — Op Note (Signed)
09/25/2017  11:14 AM  PATIENT:  Justin Goodman  62 y.o. male with lumbar stenosis at L4/5  PRE-OPERATIVE DIAGNOSIS:  LUMBAR STENOSIS WITH NEUROGENIC CLAUDICATION L4/5  POST-OPERATIVE DIAGNOSIS:  LUMBAR STENOSIS WITH NEUROGENIC CLAUDICATION L4/5  PROCEDURE:  Procedure(s):bilateral semihemilaminectomies LUMBAR FOUR- LUMBAR FIVE for spinal canal decompression  SURGEON: Surgeon(s): Coletta Memosabbell, Lennell Shanks, MD  ASSISTANTS:none  ANESTHESIA:   general  EBL:  Total I/O In: 500 [I.V.:500] Out: 100 [Blood:100]  BLOOD ADMINISTERED:none  CELL SAVER GIVEN:none  COUNT:per nursing  DRAINS: none   SPECIMEN:  No Specimen  DICTATION: Justin ChimesMichael Vandergriff was taken to the operating room, intubated, and administered a general anesthetic without difficulty. He was positioned prone on a Wilson frame with all pressure points padded. His lumbar region was prepped and draped in a sterile manner. I infiltrated my planned incision with lidocaine. I opened the skin with a 10 blade and dissected sharply to expose the lamina of L4, and L5 bilaterally. I confirmed my location at L4/5 with an intraoperative xray.  I used the drill to perform bilateral semihemilaminectomies of L4, along with Kerrison punches. I removed hypertrophied ligamentum flavum in order to decompress the thecal sac the L4 and L5 nerve roots bilaterally. I ensured the midline was decompressed and proved this by irrigating saline and seeing the irrigant on both the right and left sides. I achieved hemostasis. I closed the wound with vicryl sutures approximating the thoracolumbar surgery, subcutaneous, and subcuticular planes. I applied Dermabond for a sterile dressing. He was was onto the stretcher, supine, then extubAted.   PLAN OF CARE: Admit for overnight observation  PATIENT DISPOSITION:  PACU - hemodynamically stable.   Delay start of Pharmacological VTE agent (>24hrs) due to surgical blood loss or risk of bleeding:  yes

## 2017-09-25 NOTE — Anesthesia Procedure Notes (Signed)
Procedure Name: Intubation Date/Time: 09/25/2017 9:11 AM Performed by: Clearnce Sorrel, CRNA Pre-anesthesia Checklist: Patient identified, Emergency Drugs available, Suction available, Patient being monitored and Timeout performed Patient Re-evaluated:Patient Re-evaluated prior to induction Oxygen Delivery Method: Circle system utilized Preoxygenation: Pre-oxygenation with 100% oxygen Induction Type: IV induction Ventilation: Mask ventilation without difficulty Laryngoscope Size: Mac and 4 Grade View: Grade II Tube type: Oral Tube size: 7.5 mm Number of attempts: 1 Airway Equipment and Method: Stylet Placement Confirmation: ETT inserted through vocal cords under direct vision,  positive ETCO2 and breath sounds checked- equal and bilateral Secured at: 22 cm Tube secured with: Tape Dental Injury: Teeth and Oropharynx as per pre-operative assessment

## 2017-09-25 NOTE — Anesthesia Postprocedure Evaluation (Signed)
Anesthesia Post Note  Patient: Justin Goodman  Procedure(s) Performed: LAMINECTOMY LUMBAR FOUR- LUMBAR FIVE (N/A Spine Lumbar)     Patient location during evaluation: PACU Anesthesia Type: General Level of consciousness: awake and alert and oriented Pain management: pain level controlled Vital Signs Assessment: post-procedure vital signs reviewed and stable Respiratory status: spontaneous breathing, nonlabored ventilation and respiratory function stable Cardiovascular status: blood pressure returned to baseline and stable Postop Assessment: no apparent nausea or vomiting Anesthetic complications: no    Last Vitals:  Vitals:   09/25/17 1154 09/25/17 1222  BP:  (!) 142/77  Pulse:  96  Resp:  16  Temp: 36.5 C (!) 36.3 C  SpO2:  99%    Last Pain:  Vitals:   09/25/17 1222  TempSrc: Oral  PainSc:                  Clair Alfieri,Kareen A.

## 2017-09-25 NOTE — Evaluation (Signed)
Physical Therapy Evaluation Patient Details Name: Justin Goodman MRN: [id number] DOB: 06-03-1955 Today's Date: 09/25/2017   History of Present Illness  Pt is a 62 y/o male s/p L4-5 decompression. PMH inlcudes COPD, HTN, PVD, and DM.   Clinical Impression  Patient is s/p above surgery resulting in the deficits listed below (see PT Problem List). Pt presenting with unsteadiness and difficulty maintaining precautions throughout session. Also demonstrated improper use of DME and required continuous cues for appropriate use. Required min A for mobility throughout using cane. Educated about DME, precautions, and walking program to perform at home. Patient will benefit from skilled PT to increase their independence and safety with mobility (while adhering to their precautions) to allow discharge to the venue listed below.     Follow Up Recommendations Home health PT;Supervision for mobility/OOB    Equipment Recommendations  None recommended by PT    Recommendations for Other Services OT consult     Precautions / Restrictions Precautions Precautions: Back Precaution Booklet Issued: Yes (comment) Precaution Comments: Reviewed back precautions, however, pt required continuous cues to maintain throughout session.  Restrictions Weight Bearing Restrictions: No      Mobility  Bed Mobility Overal bed mobility: Needs Assistance Bed Mobility: Rolling;Sidelying to Sit;Sit to Sidelying Rolling: Min guard Sidelying to sit: Min assist     Sit to sidelying: Min guard General bed mobility comments: Min A for trunk elevation from sidelying. Min guard otherwise to ensure maintenance of precautions.   Transfers Overall transfer level: Needs assistance Equipment used: Straight cane Transfers: Sit to/from Stand Sit to Stand: Min assist         General transfer comment: Min A for steadying assist.   Ambulation/Gait Ambulation/Gait assistance: Min assist Gait Distance (Feet): 125  Feet Assistive device: Straight cane Gait Pattern/deviations: Step-through pattern;Decreased stride length;Trunk flexed Gait velocity: Decreased    General Gait Details: Slow, unsteady gait. Improper use of cane noted, and pt did not correct with cues. Educated about use of RW at home to increase stability.   Stairs            Wheelchair Mobility    Modified Rankin (Stroke Patients Only)       Balance Overall balance assessment: Needs assistance Sitting-balance support: No upper extremity supported;Feet supported Sitting balance-Leahy Scale: Fair     Standing balance support: Single extremity supported;During functional activity Standing balance-Leahy Scale: Poor Standing balance comment: Reliant on UE and external support.                              Pertinent Vitals/Pain Pain Assessment: 0-10 Pain Score: 10-Worst pain ever Pain Location: back  Pain Descriptors / Indicators: Aching;Operative site guarding Pain Intervention(s): Limited activity within patient's tolerance;Monitored during session;Repositioned    Home Living Family/patient expects to be discharged to:: Private residence Living Arrangements: Alone Available Help at Discharge: Friend(s);Available PRN/intermittently Type of Home: Apartment Home Access: Stairs to enter Entrance Stairs-Rails: Left Entrance Stairs-Number of Steps: 10 Home Layout: One level Home Equipment: Walker - 2 wheels;Tub bench;Cane - single point      Prior Function Level of Independence: Independent with assistive device(s)         Comments: Used cane for ambulation      Hand Dominance        Extremity/Trunk Assessment   Upper Extremity Assessment Upper Extremity Assessment: Defer to OT evaluation    Lower Extremity Assessment Lower Extremity Assessment: Generalized weakness    Cervical /  Trunk Assessment Cervical / Trunk Assessment: Kyphotic;Other exceptions Cervical / Trunk Exceptions: s/p  lumbar decompression   Communication   Communication: No difficulties  Cognition Arousal/Alertness: Awake/alert Behavior During Therapy: WFL for tasks assessed/performed Overall Cognitive Status: No family/caregiver present to determine baseline cognitive functioning                                        General Comments General comments (skin integrity, edema, etc.): Educated about generalized walking program to perform at home.     Exercises     Assessment/Plan    PT Assessment Patient needs continued PT services  PT Problem List Decreased strength;Decreased balance;Decreased mobility;Decreased knowledge of use of DME;Decreased knowledge of precautions;Pain       PT Treatment Interventions DME instruction;Gait training;Stair training;Functional mobility training;Therapeutic activities;Therapeutic exercise;Balance training;Patient/family education    PT Goals (Current goals can be found in the Care Plan section)  Acute Rehab PT Goals Patient Stated Goal: to go home  PT Goal Formulation: With patient Time For Goal Achievement: 10/09/17 Potential to Achieve Goals: Good    Frequency Min 5X/week   Barriers to discharge Decreased caregiver support      Co-evaluation               AM-PAC PT "6 Clicks" Daily Activity  Outcome Measure Difficulty turning over in bed (including adjusting bedclothes, sheets and blankets)?: A Little Difficulty moving from lying on back to sitting on the side of the bed? : Unable Difficulty sitting down on and standing up from a chair with arms (e.g., wheelchair, bedside commode, etc,.)?: Unable Help needed moving to and from a bed to chair (including a wheelchair)?: A Little Help needed walking in hospital room?: A Little Help needed climbing 3-5 steps with a railing? : A Lot 6 Click Score: 13    End of Session Equipment Utilized During Treatment: Gait belt Activity Tolerance: Patient tolerated treatment well Patient  left: in bed;with call bell/phone within reach Nurse Communication: Mobility status PT Visit Diagnosis: Other abnormalities of gait and mobility (R26.89);Pain;Unsteadiness on feet (R26.81) Pain - part of body: (back )    Time: [id number] PT Time Calculation (min) (ACUTE ONLY): 15 min   Charges:   PT Evaluation $PT Eval Low Complexity: 1 Low     PT G Codes:        Gladys DammeBrittany Ezekiah Massie, PT, DPT  Acute Rehabilitation Services  Pager: [phone]   Lehman PromBrittany S Imanii Gosdin 09/25/2017, 4:00 PM

## 2017-09-25 NOTE — Anesthesia Preprocedure Evaluation (Addendum)
Anesthesia Evaluation  Patient identified by MRN, date of birth, ID band Patient awake    Reviewed: Allergy & Precautions, NPO status , Patient's Chart, lab work & pertinent test results, reviewed documented beta blocker date and time   Airway Mallampati: II  TM Distance: >3 FB Neck ROM: Full    Dental  (+) Lower Dentures, Dental Advisory Given   Pulmonary asthma , COPD,  COPD inhaler, former smoker,    Pulmonary exam normal breath sounds clear to auscultation       Cardiovascular hypertension, Pt. on medications and Pt. on home beta blockers + Peripheral Vascular Disease  Normal cardiovascular exam Rhythm:Regular Rate:Normal  Echo 08/17/2016-- Left ventricle: The cavity size was normal. Wall thickness was   increased in a pattern of moderate LVH. Systolic function was  normal. The estimated ejection fraction was in the range of 60%  to 65%. Wall motion was normal; there were no regional wall  motion abnormalities. Doppler parameters are consistent with  abnormal left ventricular relaxation (grade 1 diastolic  Dysfunction).  EKG- 09/25/2017 NSR septal infarct age indeterminate   Neuro/Psych Peripheral neuropathy  Neuromuscular disease negative psych ROS   GI/Hepatic negative GI ROS, Neg liver ROS,   Endo/Other  diabetes, Well Controlled, Type 2, Oral Hypoglycemic AgentsHyperlipidemia  Renal/GU negative Renal ROS  negative genitourinary   Musculoskeletal  (+) Arthritis , Osteoarthritis,  Lumbar stenosis L4-5   Abdominal   Peds  Hematology   Anesthesia Other Findings   Reproductive/Obstetrics                            Anesthesia Physical Anesthesia Plan  ASA: II  Anesthesia Plan: General   Post-op Pain Management:    Induction: Intravenous  PONV Risk Score and Plan: 3 and Ondansetron, Dexamethasone, Treatment may vary due to age or medical condition and Midazolam  Airway Management  Planned: Oral ETT  Additional Equipment:   Intra-op Plan:   Post-operative Plan: Extubation in OR  Informed Consent: I have reviewed the patients History and Physical, chart, labs and discussed the procedure including the risks, benefits and alternatives for the proposed anesthesia with the patient or authorized representative who has indicated his/her understanding and acceptance.   Dental advisory given  Plan Discussed with: CRNA and Surgeon  Anesthesia Plan Comments:         Anesthesia Quick Evaluation

## 2017-09-25 NOTE — Care Management Note (Signed)
Case Management Note  Patient Details  Name: Justin ChimesMichael Goodman MRN: [id number] Date of Birth: [birth year]  Subjective/Objective:   62 yr old gentleman s/p L2-5 Laminectomy and decompression.                 Action/Plan: Case manager spoke with patient concerning discharge plan. Choice for Home Health agency was offered. Referral was called to [facility].    Expected Discharge Date:   09/26/17               Expected Discharge Plan:  Home w Home Health Services  In-House Referral:  NA  Discharge planning Services  CM Consult  Post Acute Care Choice:  Home Health Choice offered to:  Patient  DME Arranged:  N/A DME Agency:  NA  HH Arranged:  PT, OT, Nurse's Aide HH Agency:  [facility]  Status of Service:  Completed, signed off  If discussed at Long Length of Stay Meetings, dates discussed:    Additional Comments:  Durenda GuthrieBrady, [name], RN 09/25/2017, 4:54 PM

## 2017-09-25 NOTE — Transfer of Care (Signed)
Immediate Anesthesia Transfer of Care Note  Patient: Justin ChimesMichael Doucette  Procedure(s) Performed: LAMINECTOMY LUMBAR FOUR- LUMBAR FIVE (N/A Spine Lumbar)  Patient Location: PACU  Anesthesia Type:General  Level of Consciousness: awake, alert  and oriented  Airway & Oxygen Therapy: Patient Spontanous Breathing and Patient connected to nasal cannula oxygen  Post-op Assessment: Report given to RN and Post -op Vital signs reviewed and stable  Post vital signs: Reviewed and stable  Last Vitals:  Vitals Value Taken Time  BP 136/81 09/25/2017 10:58 AM  Temp 36.3 C 09/25/2017 10:58 AM  Pulse 93 09/25/2017 11:00 AM  Resp 12 09/25/2017 11:00 AM  SpO2 99 % 09/25/2017 11:00 AM  Vitals shown include unvalidated device data.  Last Pain:  Vitals:   09/25/17 0646  TempSrc: Oral  PainSc:       Patients Stated Pain Goal: 4 (09/25/17 0645)  Complications: No apparent anesthesia complications

## 2017-09-25 NOTE — Discharge Summary (Signed)
Physician Discharge Summary  Patient ID: Justin Goodman MRN: [id number] DOB/AGE: 64/20/1957 61 y.o.  Admit date: 09/25/2017 Discharge date: 09/25/2017  Admission Diagnoses:lumbar stenosis L4/5, neurogenic claudication  Discharge Diagnoses:  Active Problems:   Lumbar stenosis with neurogenic claudication   Discharged Condition: good  Hospital Course: Mr. Diffee was admitted and taken to the operating room for an uncomplicated bilateral semihemilaminectomy to decompress the spinal canal and nerve roots. He is ambulating, voiding, and tolerating a regular diet. His wound is clean, dry, and without signs of infection at the time of discharge.  Treatments: surgery: as above  Discharge Exam: Blood pressure 114/66, pulse 97, temperature 98.4 F (36.9 C), temperature source Oral, resp. rate 16, height 5' 9"  (1.753 m), weight 75.8 kg (167 lb), SpO2 98 %. General appearance: alert, cooperative, appears stated age and mild distress Neurologic: Alert and oriented X 3, normal strength and tone. Normal symmetric reflexes. Normal coordination and gait  Disposition: Discharge disposition: 01-Home or Self Care      LUMBAR STENOSIS WITH NEUROGENIC CLAUDICATION  Allergies as of 09/25/2017      Reactions   Amaryl [glimepiride] Other (See Comments)   Frequent hypoglycemia   Atorvastatin Other (See Comments)   HA    Cymbalta [duloxetine Hcl] Other (See Comments)   Dizziness, insomnia   Glipizide Other (See Comments)   HA   Losartan Other (See Comments)   Vision changes, HA.    Lyrica [pregabalin] Other (See Comments)   Sedation   Quinapril Cough   Simvastatin Other (See Comments)   Severe myalgias.        Medication List    TAKE these medications   ACCU-CHEK FASTCLIX LANCETS Misc Check blood glucose 4 times daily Diagnosis diabetes E11.40   ACCU-CHEK NANO SMARTVIEW w/Device Kit Check blood glucose 4 times daily Diagnosis diabetes E11.40   Alpha-Lipoic Acid 200 MG Caps Take 1  capsule by mouth daily.   amitriptyline 50 MG tablet Commonly known as:  ELAVIL TAKE 1 TABLET BY MOUTH AT BEDTIME   aspirin EC 81 MG tablet Take 1 tablet (81 mg total) by mouth daily.   B-D SINGLE USE SWABS REGULAR Pads Check blood glucose 4 times daily Diagnosis diabetes E11.40   B-D ULTRAFINE III SHORT PEN 31G X 8 MM Misc Generic drug:  Insulin Pen Needle USE FOR DAILY INSULIN INJECTION AS DIRECTED BY PHYSICIAN   gabapentin 800 MG tablet Commonly known as:  NEURONTIN Take 2 tablets (1,600 mg total) by mouth 2 (two) times daily.   HYDROcodone-acetaminophen 5-325 MG tablet Commonly known as:  NORCO/VICODIN Take 1 tablet by mouth every 6 (six) hours as needed for up to 7 days for moderate pain. What changed:  when to take this   losartan 50 MG tablet Commonly known as:  COZAAR Take 1 tablet (50 mg total) by mouth daily.   metFORMIN 1000 MG tablet Commonly known as:  GLUCOPHAGE Take 1 tablet (1,000 mg total) by mouth 2 (two) times daily with a meal.   metoprolol tartrate 50 MG tablet Commonly known as:  LOPRESSOR Take 0.5 tablets (25 mg total) by mouth 2 (two) times daily.   omeprazole 40 MG capsule Commonly known as:  PRILOSEC TAKE 1 CAPSULE BY MOUTH ONCE DAILY   pyridoxine 100 MG tablet Commonly known as:  B-6 Take 100 mg by mouth daily.   tiZANidine 4 MG tablet Commonly known as:  ZANAFLEX Take 1 tablet (4 mg total) by mouth every 6 (six) hours as needed for muscle spasms.  TOUJEO SOLOSTAR 300 UNIT/ML Sopn Generic drug:  Insulin Glargine Inject 32 Units into the skin daily. What changed:    how much to take  when to take this   traMADol 50 MG tablet Commonly known as:  ULTRAM 1 tabs by mouth Q8 hours, maximum 6 tabs per day. What changed:    how much to take  how to take this  when to take this  reasons to take this  additional instructions   traZODone 150 MG tablet Commonly known as:  DESYREL Take 1 tablet (150 mg total) by mouth at  bedtime.   umeclidinium bromide 62.5 MCG/INH Aepb Commonly known as:  INCRUSE ELLIPTA Inhale 1 puff into the lungs daily. What changed:    when to take this  reasons to take this      Follow-up Information    Ashok Pall, MD Follow up in 3 week(s).   Specialty:  Neurosurgery Why:  please call the office to make an appointment Contact information: [address] [place] [address] [phone]           Signed: Winfield Cunas 09/25/2017, 10:56 PM

## 2017-09-25 NOTE — H&P (Signed)
BP (!) 141/75   Pulse 98   Temp 97.9 F (36.6 C) (Oral)   Resp 18   Ht 5\' 9"  (1.753 m)   Wt 75.8 kg (167 lb)   SpO2 100%   BMI 24.66 kg/m  Justin Goodman comes in today at the behest of Dr. Rodney Langtonhomas Thekkekandam for evaluation of lumbar stenosis and possible operative treatment. Justin Goodman has had difficulty with his lower back for the very least 7 years. He has had injections, he has had therapy. He has had time. He has had multiple medications which have been monitored by Dr. Benjamin Stainhekkekandam. Unfortunately, all without relief. He at this point was amenable to having a surgical evaluation. Justin Goodman is not a good historian, but does report that he is disabled at this time.  He is 62 years of age. He is 5 feet 9 inches, weighs 166 pounds. Blood pressure is 103/62, pulse is 80, temperature is 98.5. He says his legs and feet have a pain level of 10. He says some of this pain is due to his diabetes. He takes Gabapentin, Elavil, Trazodone, Omeprazole, Metformin, Tramadol, and Metoprolol.  Mother is deceased, had diabetes, I believe. Father is also deceased. No information provided.  Justin Goodman says the pain has gotten worse. He is weak. He has had some recent weight loss. He says his legs have never been this small before and he is losing muscle. REVIEW OF SYSTEMS: Positive for hypercholesterolemia, leg pain at rest and with walking, neck pain, arm weakness, leg weakness, arm pain, back pain.  In the past, he had lumbar stenosis. This was a film done in 2014. EXAMINATION: He walks in a stooped fashion with a cane. He can walk on his toes poorly. He can walk on his heels poorly also. Reflexes are 2+ at the knees, trace at the ankles. Proprioception is intact in the upper and lower extremities. Memory, language, attention span and fund of knowledge appear to be normal. Pupils equal, round, and reactive to light. Full extraocular movements. Hearing intact to voice. Uvula elevates midline. Shoulder shrug is  normal. Tongue protrudes in midline. Romberg test is negative. Justin Goodman returned today. We looked at the MRI and what it shows is that he has stenosis present at L4-5. It is significantly worse on the left than the right, but this is the worst level in the lumbar region. The disc is degenerated there. It is seemingly a straightforward stenotic level. He is alert. He is oriented by 4. Speech is clear; it is fluent. Physical exam is unchanged from his visit on 08/11/2017, as is the history, family history, and medications. I did give him a prescription today for Hydrocodone. He would like to proceed with an operation, and we will go ahead and get that set up. He, however, wants to check with his insurance company, so I will not put anything in until that occurs.

## 2017-09-26 ENCOUNTER — Encounter (HOSPITAL_COMMUNITY): Payer: Self-pay | Admitting: Neurosurgery

## 2017-09-26 DIAGNOSIS — Z79899 Other long term (current) drug therapy: Secondary | ICD-10-CM | POA: Diagnosis not present

## 2017-09-26 DIAGNOSIS — M48062 Spinal stenosis, lumbar region with neurogenic claudication: Secondary | ICD-10-CM | POA: Diagnosis not present

## 2017-09-26 DIAGNOSIS — Z888 Allergy status to other drugs, medicaments and biological substances status: Secondary | ICD-10-CM | POA: Diagnosis not present

## 2017-09-26 DIAGNOSIS — Z794 Long term (current) use of insulin: Secondary | ICD-10-CM | POA: Diagnosis not present

## 2017-09-26 DIAGNOSIS — E7801 Familial hypercholesterolemia: Secondary | ICD-10-CM | POA: Diagnosis not present

## 2017-09-26 DIAGNOSIS — Z833 Family history of diabetes mellitus: Secondary | ICD-10-CM | POA: Diagnosis not present

## 2017-09-26 LAB — GLUCOSE, CAPILLARY: GLUCOSE-CAPILLARY: 172 mg/dL — AB (ref 65–99)

## 2017-09-26 NOTE — Progress Notes (Signed)
Physical Therapy Treatment Patient Details Name: Justin Goodman MRN: [id number] DOB: 1956-02-24 Today's Date: 09/26/2017    History of Present Illness Pt is a 62 y/o male s/p L4-5 decompression. PMH inlcudes COPD, HTN, PVD, and DM.     PT Comments    Patient with poor attention and highly distracted during session due to his "friend waiting for him." Session focused on stair negotiation for preparation for discharge home. Patient requiring min assist for balance with climbing 12 steps using railing and cane. Repeated cueing for step by step pattern, but patient did not correct, causing him to trip. Urged patient to have his friend guard him on the steps at home. Continue to recommend HHPT for safety in his home environment.    Follow Up Recommendations  Home health PT;Supervision for mobility/OOB     Equipment Recommendations  None recommended by PT    Recommendations for Other Services       Precautions / Restrictions Precautions Precautions: Back Precaution Booklet Issued: Yes (comment) Precaution Comments: Continuous cueing for back precautions during ADL participation. Pt argumenative concerning precautions.  Restrictions Weight Bearing Restrictions: No    Mobility  Bed Mobility               General bed mobility comments: OOB with nurse upon arrival   Transfers Overall transfer level: Needs assistance Equipment used: Straight cane Transfers: Sit to/from Stand Sit to Stand: Min guard         General transfer comment: Min guard assist for stability and safety.    Ambulation/Gait Ambulation/Gait assistance: Min assist Gait Distance (Feet): 20 Feet Assistive device: Straight cane Gait Pattern/deviations: Step-through pattern;Decreased stride length;Trunk flexed;Antalgic;Wide base of support Gait velocity: Decreased    General Gait Details: Slow, unsteady gait with bilateral foot external rotation.   Stairs Stairs: Yes Stairs assistance: Min  assist Stair Management: One rail Left;With cane Number of Stairs: 12 General stair comments: Patient requiring up to min assist for balance with stair negotiation. Repeated cueing for step by step but patient did not correct, causing him to trip when ascending (min assist provided by PT to steady). Urged patient to have friend with him when stair climbing.    Wheelchair Mobility    Modified Rankin (Stroke Patients Only)       Balance Overall balance assessment: Needs assistance Sitting-balance support: No upper extremity supported;Feet supported Sitting balance-Leahy Scale: Fair     Standing balance support: Single extremity supported;During functional activity Standing balance-Leahy Scale: Poor Standing balance comment: Relies on single UE support from cane. Highly unstable on his feet.                             Cognition Arousal/Alertness: Awake/alert Behavior During Therapy: WFL for tasks assessed/performed Overall Cognitive Status: No family/caregiver present to determine baseline cognitive functioning Area of Impairment: Attention;Following commands;Safety/judgement;Awareness                   Current Attention Level: Selective   Following Commands: Follows one step commands inconsistently;Follows multi-step commands inconsistently Safety/Judgement: Decreased awareness of safety;Decreased awareness of deficits Awareness: Intellectual   General Comments: Pt with poor insight into deficits and safety.       Exercises      General Comments General comments (skin integrity, edema, etc.): Provided maximum education for pt although he tends to rush through education and reports "my friend is waiting on me that is why I am rushing."  Pertinent Vitals/Pain Pain Assessment: Faces Faces Pain Scale: No hurt Pain Location: back  Pain Descriptors / Indicators: Aching;Operative site guarding Pain Intervention(s): Limited activity within patient's  tolerance;Monitored during session;Repositioned    Home Living Family/patient expects to be discharged to:: Private residence Living Arrangements: Alone Available Help at Discharge: Friend(s);Available PRN/intermittently Type of Home: Apartment Home Access: Stairs to enter Entrance Stairs-Rails: Left Home Layout: One level Home Equipment: Walker - 2 wheels;Tub bench;Cane - single point      Prior Function Level of Independence: Independent with assistive device(s)      Comments: Used cane for ambulation    PT Goals (current goals can now be found in the care plan section) Acute Rehab PT Goals Patient Stated Goal: to go home     Frequency    Min 5X/week      PT Plan Current plan remains appropriate    Co-evaluation              AM-PAC PT "6 Clicks" Daily Activity  Outcome Measure  Difficulty turning over in bed (including adjusting bedclothes, sheets and blankets)?: A Little Difficulty moving from lying on back to sitting on the side of the bed? : A Little Difficulty sitting down on and standing up from a chair with arms (e.g., wheelchair, bedside commode, etc,.)?: A Little Help needed moving to and from a bed to chair (including a wheelchair)?: A Little Help needed walking in hospital room?: A Little Help needed climbing 3-5 steps with a railing? : A Little 6 Click Score: 18    End of Session Equipment Utilized During Treatment: Gait belt Activity Tolerance: Patient tolerated treatment well Patient left: Other (comment)(handoff to OT) Nurse Communication: Mobility status PT Visit Diagnosis: Other abnormalities of gait and mobility (R26.89);Pain;Unsteadiness on feet (R26.81)     Time: [id number] PT Time Calculation (min) (ACUTE ONLY): 20 min  Charges:  $Therapeutic Activity: 8-22 mins                    G Codes:       Laurina Bustlearoline Peggie Hornak, PT, DPT Acute Rehabilitation Services  Pager: [phone]    Vanetta MuldersCarloine H Audelia Knape 09/26/2017, 9:36 AM

## 2017-09-26 NOTE — Progress Notes (Signed)
Patient alert and oriented, mae's well, voiding adequate amount of urine, swallowing without difficulty,  C/o mild pain at time of discharge. Patient discharged home with family. Script and discharged instructions given to patient. Patient and family stated understanding of instructions given. Patient has an appointment with Dr. Franky Machoabbell

## 2017-09-26 NOTE — Evaluation (Signed)
Occupational Therapy Evaluation Patient Details Name: Justin Goodman MRN: [id number] DOB: 1955-09-07 Today's Date: 09/26/2017    History of Present Illness Pt is a 62 y/o male s/p L4-5 decompression. PMH inlcudes COPD, HTN, PVD, and DM.    Clinical Impression   PTA, pt reports independence with cane for ADL and functional mobility. He currently presents to OT evaluation rushing through education and demonstrating poor attention and ability to attend to instructions. Pt educated concerning back precautions related to ADL participation and he verbalizes understanding but is unable to adhere to these during ADL despite maximum cues throughout tasks. Pt currently requiring min assist for LB ADL and min guard assist for ADL transfers. He is unstable on his feet during transfers. Pt would benefit from continued OT services while admitted to improve independence and safety with ADL and functional mobility prior to returning home. However, pt eager to discharge today and walking to nurses station for discharge at end of session as his ride had arrived. He declines 3-in-1 at this time but was educated on use of reacher for dressing tasks and to pick up items off of the floor to avoid bending. Pt will need HHOT follow-up to maximize safety in his home environment.     Follow Up Recommendations  Supervision/Assistance - 24 hour;Home health OT    Equipment Recommendations  3 in 1 bedside commode;Other (comment)(reacher)    Recommendations for Other Services       Precautions / Restrictions Precautions Precautions: Back Precaution Booklet Issued: Yes (comment) Precaution Comments: Continuous cueing for back precautions during ADL participation. Pt argumenative concerning precautions.  Restrictions Weight Bearing Restrictions: No      Mobility Bed Mobility               General bed mobility comments: OOB in stairwell with PT on my arrival.   Transfers Overall transfer level: Needs  assistance Equipment used: Straight cane Transfers: Sit to/from Stand Sit to Stand: Min guard         General transfer comment: Min guard assist for stability and safety.      Balance Overall balance assessment: Needs assistance Sitting-balance support: No upper extremity supported;Feet supported Sitting balance-Leahy Scale: Fair     Standing balance support: Single extremity supported;During functional activity Standing balance-Leahy Scale: Poor Standing balance comment: Relies on single UE support from cane. Highly unstable on his feet.                            ADL either performed or assessed with clinical judgement   ADL Overall ADL's : Needs assistance/impaired Eating/Feeding: Set up;Sitting   Grooming: Supervision/safety;Standing;Cueing for safety   Upper Body Bathing: Set up;Sitting;Cueing for safety   Lower Body Bathing: Minimal assistance;Sit to/from stand;Cueing for back precautions;Cueing for safety   Upper Body Dressing : Set up;Sitting;Cueing for safety   Lower Body Dressing: Minimal assistance;Sit to/from stand;Cueing for safety;Cueing for back precautions   Toilet Transfer: Min guard;Ambulation;Cueing for safety(with cane)   Toileting- Clothing Manipulation and Hygiene: Min guard;Sit to/from stand;Cueing for safety   Tub/ Shower Transfer: Min guard;Ambulation;Walk-in shower Tub/Shower Transfer Details (indicate cue type and reason): Simulated in room. Pt reporting that he does not have shower seat although per chart he does have a tub bench. Pt hurrying to leave hospital to go to breakfast with a friend and declining 3-in-1.  Functional mobility during ADLs: Min guard;Cane General ADL Comments: Pt highly unsafe with ADL participation. He is unable to  adhere to back precautions despite maximum cueing. Educated concerning compensatory strategies for LB ADL, grooming, and bathing tasks. Pt verbalizes understanding but does not adhere to strategies  or precautions without maximum cueing.      Vision Patient Visual Report: No change from baseline Vision Assessment?: No apparent visual deficits     Perception     Praxis      Pertinent Vitals/Pain Pain Assessment: Faces Faces Pain Scale: No hurt Pain Location: back  Pain Descriptors / Indicators: Aching;Operative site guarding Pain Intervention(s): Limited activity within patient's tolerance;Monitored during session;Repositioned     Hand Dominance     Extremity/Trunk Assessment Upper Extremity Assessment Upper Extremity Assessment: Generalized weakness   Lower Extremity Assessment Lower Extremity Assessment: Generalized weakness   Cervical / Trunk Assessment Cervical / Trunk Assessment: Kyphotic;Other exceptions Cervical / Trunk Exceptions: s/p lumbar decompression    Communication Communication Communication: No difficulties   Cognition Arousal/Alertness: Awake/alert Behavior During Therapy: WFL for tasks assessed/performed Overall Cognitive Status: No family/caregiver present to determine baseline cognitive functioning Area of Impairment: Attention;Following commands;Safety/judgement;Awareness                   Current Attention Level: Selective   Following Commands: Follows one step commands inconsistently;Follows multi-step commands inconsistently Safety/Judgement: Decreased awareness of safety;Decreased awareness of deficits Awareness: Intellectual   General Comments: Pt with poor insight into deficits and safety.    General Comments  Provided maximum education for pt although he tends to rush through education and reports "my friend is waiting on me that is why I am rushing."    Exercises     Shoulder Instructions      Home Living Family/patient expects to be discharged to:: Private residence Living Arrangements: Alone Available Help at Discharge: Friend(s);Available PRN/intermittently Type of Home: Apartment Home Access: Stairs to  enter Entrance Stairs-Number of Steps: 10 Entrance Stairs-Rails: Left Home Layout: One level     Bathroom Shower/Tub: Chief Strategy OfficerTub/shower unit   Bathroom Toilet: Handicapped height     Home Equipment: Environmental consultantWalker - 2 wheels;Tub bench;Cane - single point          Prior Functioning/Environment Level of Independence: Independent with assistive device(s)        Comments: Used cane for ambulation         OT Problem List: Decreased activity tolerance;Impaired balance (sitting and/or standing);Decreased safety awareness;Decreased cognition;Decreased knowledge of use of DME or AE;Decreased knowledge of precautions;Pain      OT Treatment/Interventions: Self-care/ADL training;Therapeutic exercise;Energy conservation;DME and/or AE instruction;Therapeutic activities;Patient/family education;Balance training    OT Goals(Current goals can be found in the care plan section) Acute Rehab OT Goals Patient Stated Goal: to go home  OT Goal Formulation: With patient Time For Goal Achievement: 10/10/17 Potential to Achieve Goals: Good ADL Goals Pt Will Perform Grooming: with modified independence;standing Pt Will Perform Lower Body Bathing: with modified independence;sit to/from stand;with adaptive equipment Pt Will Perform Lower Body Dressing: with modified independence;sit to/from stand;with adaptive equipment Pt Will Transfer to Toilet: with modified independence;ambulating;regular height toilet Pt Will Perform Toileting - Clothing Manipulation and hygiene: with modified independence;sit to/from stand Pt Will Perform Tub/Shower Transfer: with modified independence;Tub transfer;Shower transfer;tub bench;3 in 1;ambulating(with cane)  OT Frequency: Min 2X/week   Barriers to D/C:            Co-evaluation              AM-PAC PT "6 Clicks" Daily Activity     Outcome Measure Help from another person eating meals?: None Help  from another person taking care of personal grooming?: A Little Help  from another person toileting, which includes using toliet, bedpan, or urinal?: A Little Help from another person bathing (including washing, rinsing, drying)?: A Little Help from another person to put on and taking off regular upper body clothing?: None Help from another person to put on and taking off regular lower body clothing?: A Little 6 Click Score: 20   End of Session Equipment Utilized During Treatment: Other (comment)(single point cane) Nurse Communication: Mobility status;Other (comment)(pt unsafe)  Activity Tolerance: Patient tolerated treatment well Patient left: Other (comment)(at nurses station awaiting discharge with RN and nurse tech)  OT Visit Diagnosis: Other abnormalities of gait and mobility (R26.89)                Time: 1610-9604 OT Time Calculation (min): 10 min Charges:  OT General Charges $OT Visit: 1 Visit OT Evaluation $OT Eval Low Complexity: 1 Low G-Codes:     Doristine Section, MS OTR/L  Pager: [phone]   Zyanna Leisinger A Wyeth Hoffer 09/26/2017, 8:19 AM

## 2017-09-30 ENCOUNTER — Ambulatory Visit: Payer: Medicare HMO | Admitting: Endocrinology

## 2017-09-30 DIAGNOSIS — Z0289 Encounter for other administrative examinations: Secondary | ICD-10-CM

## 2017-10-03 DIAGNOSIS — M48062 Spinal stenosis, lumbar region with neurogenic claudication: Secondary | ICD-10-CM | POA: Diagnosis not present

## 2017-10-03 DIAGNOSIS — Z4789 Encounter for other orthopedic aftercare: Secondary | ICD-10-CM | POA: Diagnosis not present

## 2017-10-03 DIAGNOSIS — E119 Type 2 diabetes mellitus without complications: Secondary | ICD-10-CM | POA: Diagnosis not present

## 2017-10-03 DIAGNOSIS — Z794 Long term (current) use of insulin: Secondary | ICD-10-CM | POA: Diagnosis not present

## 2017-10-05 DIAGNOSIS — Z794 Long term (current) use of insulin: Secondary | ICD-10-CM | POA: Diagnosis not present

## 2017-10-05 DIAGNOSIS — M48062 Spinal stenosis, lumbar region with neurogenic claudication: Secondary | ICD-10-CM | POA: Diagnosis not present

## 2017-10-05 DIAGNOSIS — E119 Type 2 diabetes mellitus without complications: Secondary | ICD-10-CM | POA: Diagnosis not present

## 2017-10-05 DIAGNOSIS — Z4789 Encounter for other orthopedic aftercare: Secondary | ICD-10-CM | POA: Diagnosis not present

## 2017-10-07 DIAGNOSIS — Z794 Long term (current) use of insulin: Secondary | ICD-10-CM | POA: Diagnosis not present

## 2017-10-07 DIAGNOSIS — M48062 Spinal stenosis, lumbar region with neurogenic claudication: Secondary | ICD-10-CM | POA: Diagnosis not present

## 2017-10-07 DIAGNOSIS — Z4789 Encounter for other orthopedic aftercare: Secondary | ICD-10-CM | POA: Diagnosis not present

## 2017-10-07 DIAGNOSIS — E119 Type 2 diabetes mellitus without complications: Secondary | ICD-10-CM | POA: Diagnosis not present

## 2017-10-08 DIAGNOSIS — M48062 Spinal stenosis, lumbar region with neurogenic claudication: Secondary | ICD-10-CM | POA: Diagnosis not present

## 2017-10-08 DIAGNOSIS — E119 Type 2 diabetes mellitus without complications: Secondary | ICD-10-CM | POA: Diagnosis not present

## 2017-10-08 DIAGNOSIS — Z794 Long term (current) use of insulin: Secondary | ICD-10-CM | POA: Diagnosis not present

## 2017-10-08 DIAGNOSIS — Z4789 Encounter for other orthopedic aftercare: Secondary | ICD-10-CM | POA: Diagnosis not present

## 2017-10-12 DIAGNOSIS — M48062 Spinal stenosis, lumbar region with neurogenic claudication: Secondary | ICD-10-CM | POA: Diagnosis not present

## 2017-10-12 DIAGNOSIS — Z794 Long term (current) use of insulin: Secondary | ICD-10-CM | POA: Diagnosis not present

## 2017-10-12 DIAGNOSIS — E119 Type 2 diabetes mellitus without complications: Secondary | ICD-10-CM | POA: Diagnosis not present

## 2017-10-12 DIAGNOSIS — Z4789 Encounter for other orthopedic aftercare: Secondary | ICD-10-CM | POA: Diagnosis not present

## 2017-10-14 DIAGNOSIS — Z794 Long term (current) use of insulin: Secondary | ICD-10-CM | POA: Diagnosis not present

## 2017-10-14 DIAGNOSIS — Z4789 Encounter for other orthopedic aftercare: Secondary | ICD-10-CM | POA: Diagnosis not present

## 2017-10-14 DIAGNOSIS — M48062 Spinal stenosis, lumbar region with neurogenic claudication: Secondary | ICD-10-CM | POA: Diagnosis not present

## 2017-10-14 DIAGNOSIS — E119 Type 2 diabetes mellitus without complications: Secondary | ICD-10-CM | POA: Diagnosis not present

## 2017-10-16 DIAGNOSIS — Z794 Long term (current) use of insulin: Secondary | ICD-10-CM | POA: Diagnosis not present

## 2017-10-16 DIAGNOSIS — M48062 Spinal stenosis, lumbar region with neurogenic claudication: Secondary | ICD-10-CM | POA: Diagnosis not present

## 2017-10-16 DIAGNOSIS — E119 Type 2 diabetes mellitus without complications: Secondary | ICD-10-CM | POA: Diagnosis not present

## 2017-10-16 DIAGNOSIS — Z4789 Encounter for other orthopedic aftercare: Secondary | ICD-10-CM | POA: Diagnosis not present

## 2017-10-19 ENCOUNTER — Other Ambulatory Visit: Payer: Self-pay | Admitting: Sports Medicine

## 2017-10-19 DIAGNOSIS — E119 Type 2 diabetes mellitus without complications: Secondary | ICD-10-CM | POA: Diagnosis not present

## 2017-10-19 DIAGNOSIS — Z4789 Encounter for other orthopedic aftercare: Secondary | ICD-10-CM | POA: Diagnosis not present

## 2017-10-19 DIAGNOSIS — M48061 Spinal stenosis, lumbar region without neurogenic claudication: Secondary | ICD-10-CM

## 2017-10-19 DIAGNOSIS — Z794 Long term (current) use of insulin: Secondary | ICD-10-CM | POA: Diagnosis not present

## 2017-10-19 DIAGNOSIS — M48062 Spinal stenosis, lumbar region with neurogenic claudication: Secondary | ICD-10-CM | POA: Diagnosis not present

## 2017-10-21 DIAGNOSIS — M48062 Spinal stenosis, lumbar region with neurogenic claudication: Secondary | ICD-10-CM | POA: Diagnosis not present

## 2017-10-21 DIAGNOSIS — E119 Type 2 diabetes mellitus without complications: Secondary | ICD-10-CM | POA: Diagnosis not present

## 2017-10-21 DIAGNOSIS — Z794 Long term (current) use of insulin: Secondary | ICD-10-CM | POA: Diagnosis not present

## 2017-10-21 DIAGNOSIS — Z4789 Encounter for other orthopedic aftercare: Secondary | ICD-10-CM | POA: Diagnosis not present

## 2017-10-22 ENCOUNTER — Ambulatory Visit (INDEPENDENT_AMBULATORY_CARE_PROVIDER_SITE_OTHER): Payer: Medicare HMO | Admitting: Family Medicine

## 2017-10-22 ENCOUNTER — Encounter: Payer: Self-pay | Admitting: Family Medicine

## 2017-10-22 VITALS — BP 100/56 | HR 79 | Wt 165.0 lb

## 2017-10-22 DIAGNOSIS — M5441 Lumbago with sciatica, right side: Secondary | ICD-10-CM

## 2017-10-22 DIAGNOSIS — I10 Essential (primary) hypertension: Secondary | ICD-10-CM

## 2017-10-22 DIAGNOSIS — E1142 Type 2 diabetes mellitus with diabetic polyneuropathy: Secondary | ICD-10-CM

## 2017-10-22 DIAGNOSIS — M5442 Lumbago with sciatica, left side: Secondary | ICD-10-CM | POA: Diagnosis not present

## 2017-10-22 MED ORDER — TOUJEO SOLOSTAR 300 UNIT/ML ~~LOC~~ SOPN: 50.0000 [IU] | PEN_INJECTOR | Freq: Every day | SUBCUTANEOUS | 5 refills | Status: DC

## 2017-10-22 MED ORDER — LOSARTAN POTASSIUM 25 MG PO TABS: 25.0000 mg | ORAL_TABLET | Freq: Every day | ORAL | 1 refills | Status: DC

## 2017-10-22 NOTE — Progress Notes (Signed)
Subjective:    CC: DM  HPI:  62 year old male is here today for follow-up for uncontrolled diabetes.  He had a lumbar laminectomy performed on June 21 with Dr. Coletta MemosKyle Cabbell.  Is actually doing well from his surgery.  He is driving again and ambulating well.  He still getting some intermittent pain but says the constant pain in his legs is significantly better.  DM-we did increase his Toujeo to 40 units daily when I last saw him on June 6.  He has been using the 40 units and says his blood sugars have been better.  They have been running in the low 200s fasting before breakfast.  And he actually feels better.  Hypertension-follow-up he is taking his medications regularly.  Is not had any dizziness or lightheadedness.  He is due for yearly colon cancer screening. Past medical history, Surgical history, Family history not pertinant except as noted below, Social history, Allergies, and medications have been entered into the medical record, reviewed, and corrections made.   Review of Systems: No fevers, chills, night sweats, weight loss, chest pain, or shortness of breath.   Objective:    General: Well Developed, well nourished, and in no acute distress.  Neuro: Alert and oriented x3, extra-ocular muscles intact, sensation grossly intact.  HEENT: Normocephalic, atraumatic  Skin: Warm and dry, no rashes. Cardiac: Regular rate and rhythm, no murmurs rubs or gallops, no lower extremity edema.  Respiratory: Clear to auscultation bilaterally. Not using accessory muscles, speaking in full sentences.   Impression and Recommendations:    DM- Increase Toujeo to 45 units.  Follow-up in 6 weeks.  Hopefully he can bring in his glucometer at that time and we can take a look at the blood sugars.  I will go ahead and send over new prescription that rates up to 50 units as we will likely need to tweak his insulin regimen again.  We will do a hemoglobin A1c next time.  Low BP - will decrease the losartan to  25mg .  If he has some of the 50 mg left he can certainly split them in half.  Check at follow-up in 6 weeks.  Status post lumbar spine surgery for spinal stenosis-he is actually doing really well and is [name] had significant improvement in his pain in his lower extremities.  Hopefully he will be able to start weaning some of his pain medication soon.  Actually since it tends to make him more sedated.  Colon Cancer screening.

## 2017-10-22 NOTE — Patient Instructions (Signed)
Increase Toujeo to 45 units.

## 2017-10-26 DIAGNOSIS — Z794 Long term (current) use of insulin: Secondary | ICD-10-CM | POA: Diagnosis not present

## 2017-10-26 DIAGNOSIS — M48062 Spinal stenosis, lumbar region with neurogenic claudication: Secondary | ICD-10-CM | POA: Diagnosis not present

## 2017-10-26 DIAGNOSIS — Z4789 Encounter for other orthopedic aftercare: Secondary | ICD-10-CM | POA: Diagnosis not present

## 2017-10-26 DIAGNOSIS — E119 Type 2 diabetes mellitus without complications: Secondary | ICD-10-CM | POA: Diagnosis not present

## 2017-10-28 DIAGNOSIS — Z4789 Encounter for other orthopedic aftercare: Secondary | ICD-10-CM | POA: Diagnosis not present

## 2017-10-28 DIAGNOSIS — M48062 Spinal stenosis, lumbar region with neurogenic claudication: Secondary | ICD-10-CM | POA: Diagnosis not present

## 2017-10-28 DIAGNOSIS — E119 Type 2 diabetes mellitus without complications: Secondary | ICD-10-CM | POA: Diagnosis not present

## 2017-10-28 DIAGNOSIS — Z794 Long term (current) use of insulin: Secondary | ICD-10-CM | POA: Diagnosis not present

## 2017-12-02 ENCOUNTER — Telehealth: Payer: Self-pay | Admitting: Family Medicine

## 2017-12-02 NOTE — Telephone Encounter (Signed)
Pt called and left a Voicemail requesting CMA_ Tonya call him back

## 2017-12-03 ENCOUNTER — Encounter: Payer: Self-pay | Admitting: Endocrinology

## 2017-12-03 ENCOUNTER — Ambulatory Visit: Payer: Medicare HMO | Admitting: Endocrinology

## 2017-12-03 VITALS — BP 122/68 | HR 82 | Ht 69.0 in | Wt 167.6 lb

## 2017-12-03 DIAGNOSIS — E1142 Type 2 diabetes mellitus with diabetic polyneuropathy: Secondary | ICD-10-CM

## 2017-12-03 LAB — POCT GLYCOSYLATED HEMOGLOBIN (HGB A1C): Hemoglobin A1C: 7.9 % — AB (ref 4.0–5.6)

## 2017-12-03 MED ORDER — TOUJEO SOLOSTAR 300 UNIT/ML ~~LOC~~ SOPN: 45.0000 [IU] | PEN_INJECTOR | SUBCUTANEOUS | Status: DC

## 2017-12-03 MED ORDER — GABAPENTIN 600 MG PO TABS: ORAL_TABLET | ORAL | 3 refills | Status: DC

## 2017-12-03 MED ORDER — HYDROCODONE-ACETAMINOPHEN 5-325 MG PO TABS: 1.0000 | ORAL_TABLET | Freq: Four times a day (QID) | ORAL | 0 refills | Status: DC | PRN

## 2017-12-03 NOTE — Telephone Encounter (Signed)
Returned pt's call and he stated that his prescription for Gabapentin was supposed to be for 3x a day. I informed him that Dr. Linford ArnoldMetheney did not write this for him and that I would need to fwd this to Dr. Karie Schwalbe since this was changed by him. He asked that this be changed due to his pain not getting better and not being able to sleep. He would like to pick this up TOMORROW. I informed him that I can send this message to Dr. Karie Schwalbe I cannot guarantee how fast he would respond to me about this I can only try.Laureen Ochs.Jarron Curley, Viann Shoveonya [name], CMA

## 2017-12-03 NOTE — Telephone Encounter (Signed)
Scription sent for hydrocodone.  20 tabs sent.  If not improving then may need to follow-up with Dr. TKarie Schwalbe

## 2017-12-03 NOTE — Telephone Encounter (Signed)
Left DETAILED vm advising pt of medication recommendations. Informed him of the MAXIMUM DOSAGE OF GABAPENTIN (3,600 MG/DAY), and that anything above that amount will result in an OVERDOSE!  Also informed pt of pain medication being sent for his arm and that if this doesn't improve Dr. Linford Arnoldmetheney recommended that he f/u w/Dr. Marlena Clipper..Arlyne Brandes [name]

## 2017-12-03 NOTE — Progress Notes (Signed)
Subjective:    Patient ID: Justin Goodman, male    DOB: March 06, 1956, 62 y.o.   MRN: [id number]  HPI pt is referred by Dr Madilyn Fireman, for diabetes.  Pt states DM was dx'ed in 1999; he has mild neuropathy of the lower extremities; he has associated nephropathy and PAD; he has been on insulin since dx; pt says his diet and exercise are good; he has never had pancreatitis, pancreatic surgery, DKA.  Last episode of severe hypoglycemia was 2017.  He takes metformin and toujeo, 45 units qam.  He says cbg's vary from 162-398.  It is in general higher as the day goes on.  He says he can no longer afford insulin analogs.  He requests to consider pump rx.    Past Medical History:  Diagnosis Date  . Chronic back pain    only mild loss of T12-L1 disk ht on xray 1-12  . COPD (chronic obstructive pulmonary disease) ([place])   . Diabetes mellitus    w/ neuropathy  . Hypertension   . Smoker     Past Surgical History:  Procedure Laterality Date  . LUMBAR LAMINECTOMY/DECOMPRESSION MICRODISCECTOMY N/A 09/25/2017   Procedure: LAMINECTOMY LUMBAR FOUR- LUMBAR FIVE;  Surgeon: Ashok Pall, MD;  Location: [facility];  Service: Neurosurgery;  Laterality: N/A;  . UVULOPALATOPHARYNGOPLASTY     2001    Social History   Socioeconomic History  . Marital status: Single    Spouse name: Not on file  . Number of children: Not on file  . Years of education: Not on file  . Highest education level: Not on file  Occupational History  . Not on file  Social Needs  . Financial resource strain: Not on file  . Food insecurity:    Worry: Not on file    Inability: Not on file  . Transportation needs:    Medical: Not on file    Non-medical: Not on file  Tobacco Use  . Smoking status: Former Smoker    Packs/day: 1.00    Years: 20.00    Pack years: 20.00    Types: Cigarettes    Last attempt to quit: 2013    Years since quitting: 6.6  . Smokeless tobacco: Never Used  Substance and Sexual Activity  . Alcohol use: No  .  Drug use: No  . Sexual activity: Yes  Lifestyle  . Physical activity:    Days per week: Not on file    Minutes per session: Not on file  . Stress: Not on file  Relationships  . Social connections:    Talks on phone: Not on file    Gets together: Not on file    Attends religious service: Not on file    Active member of club or organization: Not on file    Attends meetings of clubs or organizations: Not on file    Relationship status: Not on file  . Intimate partner violence:    Fear of current or ex partner: Not on file    Emotionally abused: Not on file    Physically abused: Not on file    Forced sexual activity: Not on file  Other Topics Concern  . Not on file  Social History Narrative  . Not on file    Current Outpatient Medications on File Prior to Visit  Medication Sig Dispense Refill  . ACCU-CHEK FASTCLIX LANCETS MISC Check blood glucose 4 times daily Diagnosis diabetes E11.40 600 each 3  . Alcohol Swabs (B-D SINGLE USE SWABS REGULAR) PADS  Check blood glucose 4 times daily Diagnosis diabetes E11.40 600 each 3  . Alpha-Lipoic Acid 200 MG CAPS Take 1 capsule by mouth daily.    Marland Kitchen amitriptyline (ELAVIL) 50 MG tablet TAKE 1 TABLET BY MOUTH AT BEDTIME 90 tablet 3  . aspirin EC 81 MG tablet Take 1 tablet (81 mg total) by mouth daily. 100 tablet 1  . B-D ULTRAFINE III SHORT PEN 31G X 8 MM MISC USE FOR DAILY INSULIN INJECTION AS DIRECTED BY PHYSICIAN 100 each 4  . Blood Glucose Monitoring Suppl (ACCU-CHEK NANO SMARTVIEW) w/Device KIT Check blood glucose 4 times daily Diagnosis diabetes E11.40 1 kit 0  . gabapentin (NEURONTIN) 600 MG tablet Two tabs PO TID 180 tablet 3  . losartan (COZAAR) 25 MG tablet Take 1 tablet (25 mg total) by mouth daily. 90 tablet 1  . metFORMIN (GLUCOPHAGE) 1000 MG tablet Take 1 tablet (1,000 mg total) by mouth 2 (two) times daily with a meal. 180 tablet 3  . metoprolol tartrate (LOPRESSOR) 50 MG tablet Take 0.5 tablets (25 mg total) by mouth 2 (two) times  daily. 180 tablet 3  . omeprazole (PRILOSEC) 40 MG capsule TAKE 1 CAPSULE BY MOUTH ONCE DAILY 90 capsule 4  . pyridoxine (B-6) 100 MG tablet Take 100 mg by mouth daily.    Marland Kitchen tiZANidine (ZANAFLEX) 4 MG tablet Take 1 tablet (4 mg total) by mouth every 6 (six) hours as needed for muscle spasms. 60 tablet 0  . traZODone (DESYREL) 150 MG tablet Take 1 tablet (150 mg total) by mouth at bedtime. 90 tablet 3  . umeclidinium bromide (INCRUSE ELLIPTA) 62.5 MCG/INH AEPB Inhale 1 puff into the lungs daily. (Patient taking differently: Inhale 1 puff into the lungs daily as needed (asthma). ) 1 each 11   No current facility-administered medications on file prior to visit.     Allergies  Allergen Reactions  . Amaryl [Glimepiride] Other (See Comments)    Frequent hypoglycemia  . Atorvastatin Other (See Comments)    HA   . Cymbalta [Duloxetine Hcl] Other (See Comments)    Dizziness, insomnia  . Glipizide Other (See Comments)    HA  . Losartan Other (See Comments)    Vision changes, HA.   Marland Kitchen Lyrica [Pregabalin] Other (See Comments)    Sedation  . Quinapril Cough  . Simvastatin Other (See Comments)    Severe myalgias.      Family History  Problem Relation Age of Onset  . Diabetes Mother   . Alcohol abuse Father        Liver disease killed father @ age 18.  Marland Kitchen Alcohol abuse Brother   . Heart attack Brother        Died at age 26.    BP 122/68 (BP Location: Left Arm, Patient Position: Sitting, Cuff Size: Normal)   Pulse 82   Ht _0  (1.753 m)   Wt 167 lb 9.6 oz (76 kg)   SpO2 96%   BMI 24.75 kg/m     Review of Systems denies weight loss, blurry vision, headache, chest pain, sob, n/v, urinary frequency, muscle cramps, excessive diaphoresis, memory loss, depression, cold intolerance, rhinorrhea, and easy bruising.         Objective:   Physical Exam VS: see vs page GEN: no distress HEAD: head: no deformity eyes: no periorbital swelling, no proptosis external nose and ears are  normal mouth: no lesion seen NECK: supple, thyroid is not enlarged CHEST WALL: no deformity LUNGS: clear to auscultation CV: reg  rate and rhythm, no murmur ABD: abdomen is soft, nontender.  no hepatosplenomegaly.  not distended.  no hernia MUSCULOSKELETAL: muscle bulk and strength are grossly normal.  no obvious joint swelling.  gait is normal and steady EXTEMITIES: no deformity.  no ulcer on the feet.  feet are of normal color and temp.  no edema. There is bilateral onychomycosis of the toenails.   PULSES: dorsalis pedis intact bilat.  no carotid bruit NEURO:  cn 2-12 grossly intact.   readily moves all 4's.  sensation is intact to touch on the feet SKIN:  Normal texture and temperature.  No rash or suspicious lesion is visible.   NODES:  None palpable at the neck.   PSYCH: alert, well-oriented.  Does not appear anxious nor depressed.    Lab Results  Component Value Date   HGBA1C 7.9 (A) 12/03/2017   Lab Results  Component Value Date   CREATININE 0.61 09/18/2017   BUN 7 09/18/2017   NA 138 09/18/2017   K 4.4 09/18/2017   CL 103 09/18/2017   CO2 30 09/18/2017   I have reviewed outside records, and summarized: Pt was noted to have elevated a1c, and referred here.  Other probs addressed were back pain, HTN, and wellness.      Assessment & Plan:  Insulin-requiring type 2 DM, with PAD: this is the best control this pt should aim for, given this regimen, which does match insulin to his changing needs throughout the day. We'll pursue pump rx.   Patient Instructions  good diet and exercise significantly improve the control of your diabetes.  please let me know if you wish to be referred to a dietician.  high blood sugar is very risky to your health.  you should see an eye doctor and dentist every year.  It is very important to get all recommended vaccinations.  Controlling your blood pressure and cholesterol drastically reduces the damage diabetes does to your body.  Those who smoke  should quit.  Please discuss these with your doctor.  check your blood sugar twice a day.  vary the time of day when you check, between before the 3 meals, and at bedtime.  also check if you have symptoms of your blood sugar being too high or too low.  please keep a record of the readings and bring it to your next appointment here (or you can bring the meter itself).  You can write it on any piece of paper.  please call us sooner if your blood sugar goes below 70, or if you have a lot of readings over 200.  Please continue the same insulin for now.   Please see Vaughan Basta, to consider an insulin pump.  Please come back for a follow-up appointment in 2 months.

## 2017-12-03 NOTE — Patient Instructions (Addendum)
good diet and exercise significantly improve the control of your diabetes.  please let me know if you wish to be referred to a dietician.  high blood sugar is very risky to your health.  you should see an eye doctor and dentist every year.  It is very important to get all recommended vaccinations.  Controlling your blood pressure and cholesterol drastically reduces the damage diabetes does to your body.  Those who smoke should quit.  Please discuss these with your doctor.  check your blood sugar twice a day.  vary the time of day when you check, between before the 3 meals, and at bedtime.  also check if you have symptoms of your blood sugar being too high or too low.  please keep a record of the readings and bring it to your next appointment here (or you can bring the meter itself).  You can write it on any piece of paper.  please call us sooner if your blood sugar goes below 70, or if you have a lot of readings over 200.  Please continue the same insulin for now.   Please see Justin Goodman, to consider an insulin pump.  Please come back for a follow-up appointment in 2 months.

## 2017-12-03 NOTE — Telephone Encounter (Signed)
I am happy to increase him to the maximum dose, it is not going to be 2 800mg  pills 3 times daily, that is an overdose. Switching to 600 mg, he can do 2 tabs 3 times daily which is the maximum dose of 3600 mg/day..Marland Kitchen

## 2017-12-03 NOTE — Telephone Encounter (Signed)
Pt wants a refill for pain medication that Dr. Linford ArnoldMetheney gave him for arm pain. He stated that his L arm has been hurting him. He asked if this would be ready for him to p/u at [facility] TOMORROW? I told him that I would have to send this to her and await her response. I looked back on his medication list and did not see any current pain medication. He said it was something 325. I told him that I would look into this and fwd this to her and get back to him when she made a decision.Heath GoldBarkley, Agustus Mane [name], CMA

## 2017-12-09 ENCOUNTER — Encounter: Payer: Medicare HMO | Attending: Endocrinology | Admitting: Nutrition

## 2017-12-09 DIAGNOSIS — E1142 Type 2 diabetes mellitus with diabetic polyneuropathy: Secondary | ICD-10-CM

## 2017-12-14 ENCOUNTER — Encounter: Payer: Self-pay | Admitting: Family Medicine

## 2017-12-14 ENCOUNTER — Ambulatory Visit (INDEPENDENT_AMBULATORY_CARE_PROVIDER_SITE_OTHER): Payer: Medicare HMO | Admitting: Family Medicine

## 2017-12-14 VITALS — BP 107/53 | HR 77 | Ht 69.0 in | Wt 166.0 lb

## 2017-12-14 DIAGNOSIS — I10 Essential (primary) hypertension: Secondary | ICD-10-CM

## 2017-12-14 DIAGNOSIS — E1142 Type 2 diabetes mellitus with diabetic polyneuropathy: Secondary | ICD-10-CM | POA: Diagnosis not present

## 2017-12-14 DIAGNOSIS — H6123 Impacted cerumen, bilateral: Secondary | ICD-10-CM | POA: Diagnosis not present

## 2017-12-14 DIAGNOSIS — R0982 Postnasal drip: Secondary | ICD-10-CM | POA: Diagnosis not present

## 2017-12-14 DIAGNOSIS — R05 Cough: Secondary | ICD-10-CM | POA: Diagnosis not present

## 2017-12-14 DIAGNOSIS — R059 Cough, unspecified: Secondary | ICD-10-CM

## 2017-12-14 MED ORDER — UMECLIDINIUM BROMIDE 62.5 MCG/INH IN AEPB: 1.0000 | INHALATION_SPRAY | Freq: Every day | RESPIRATORY_TRACT | 11 refills | Status: DC

## 2017-12-14 NOTE — Progress Notes (Signed)
Subjective:    CC: DM f/U   HPI:  Diabetes - no hypoglycemic events. No wounds or sores that are not healing well. No increased thirst or urination. Checking glucose at home. Taking medications as prescribed without any side effects. Toujeo is up to 45 units.  Blood sugars have been running 1 88-2 03 more recently.  The lowest he seen was 164 but no hypoglycemic episodes.  Did get meet with Dr. Everardo All to discuss an insulin pump.  He was hoping he could get on something like [place] but they do not make it anymore.  He is decided not to go with the insulin pump and feels like it is quite complicated.  He would like his ears irrigated today.  Says they are both blocked up.    He also feels like he has a lot of drainage and congestion in the back of his throat.  He says that is been going on for about 4 weeks.  When it first started he had a lot of sneezing but no other cold symptoms.  He constantly    Past medical history, Surgical history, Family history not pertinant except as noted below, Social history, Allergies, and medications have been entered into the medical record, reviewed, and corrections made.   Review of Systems: No fevers, chills, night sweats, weight loss, chest pain, or shortness of breath.   Objective:    General: Well Developed, well nourished, and in no acute distress.  Neuro: Alert and oriented x3, extra-ocular muscles intact, sensation grossly intact.  HEENT: Normocephalic, atraumatic oropharynx is clear, TMs and canals are clear bilaterally.  Mouth is very dry. Skin: Warm and dry, no rashes. Cardiac: Regular rate and rhythm, no murmurs rubs or gallops, no lower extremity edema.  Respiratory: Clear to auscultation bilaterally. Not using accessory muscles, speaking in full sentences.   Impression and Recommendations:    DM -improved.  Hemoglobin A1c down to 7.9 a couple weeks ago when he saw Dr. Everardo All which is great improvement.  He is now using 45 units of Toujeo.  I  would have him go up by 1 unit every 2 weeks until he gets to 48 units.  He is extremely nervous about having hypoglycemic events.  And he feels when his sugars start dropping it affects the pain and neuropathy in his legs.  Cerumen impaction -with ears irrigated today.  Patient tolerated well.  His nasal drip/cough-likely from allergies.  Could recommend a trial of a nasal steroid spray.

## 2017-12-14 NOTE — Patient Instructions (Signed)
For your dry mouth you can try an over-the-counter product called Biotene.  It helps to coat and moisturize the mouth.  Try increasing her Toujeo by 1 unit every 2 weeks until you get up to 48 units.  When she is been on 48 units for 2 weeks please give me a call back and let me know how your blood sugars are doing.  Your cough and postnasal drip recommend a trial of over-the-counter Flonase or Nasonex.  2 sprays in each nostril daily for couple weeks.  If at any point you feel like her cough is moving into your chest then please let us know.

## 2017-12-24 ENCOUNTER — Telehealth: Payer: Self-pay | Admitting: Endocrinology

## 2017-12-24 NOTE — Telephone Encounter (Signed)
I received form for continuous glucose monitor.  For that, pt neds to be checking cbg qid.  Do you want to do that?

## 2018-01-04 ENCOUNTER — Other Ambulatory Visit: Payer: Self-pay | Admitting: Family Medicine

## 2018-01-05 ENCOUNTER — Ambulatory Visit (INDEPENDENT_AMBULATORY_CARE_PROVIDER_SITE_OTHER): Payer: Medicare HMO | Admitting: Family Medicine

## 2018-01-05 ENCOUNTER — Encounter: Payer: Self-pay | Admitting: Family Medicine

## 2018-01-05 VITALS — BP 131/67 | HR 90 | Temp 98.6°F | Ht 69.0 in | Wt 170.0 lb

## 2018-01-05 DIAGNOSIS — Z114 Encounter for screening for human immunodeficiency virus [HIV]: Secondary | ICD-10-CM | POA: Diagnosis not present

## 2018-01-05 DIAGNOSIS — Z1159 Encounter for screening for other viral diseases: Secondary | ICD-10-CM

## 2018-01-05 DIAGNOSIS — R05 Cough: Secondary | ICD-10-CM | POA: Diagnosis not present

## 2018-01-05 DIAGNOSIS — R5383 Other fatigue: Secondary | ICD-10-CM | POA: Diagnosis not present

## 2018-01-05 DIAGNOSIS — R059 Cough, unspecified: Secondary | ICD-10-CM

## 2018-01-05 MED ORDER — AZITHROMYCIN 250 MG PO TABS: 250.0000 mg | ORAL_TABLET | Freq: Every day | ORAL | 0 refills | Status: DC

## 2018-01-05 MED ORDER — BENZONATATE 200 MG PO CAPS: 200.0000 mg | ORAL_CAPSULE | Freq: Three times a day (TID) | ORAL | 0 refills | Status: DC | PRN

## 2018-01-05 NOTE — Patient Instructions (Addendum)
Thank you for coming in today. Use the tessalon pearles.  Get labs and xray tomorrow.    If not improved fill and take azithromycin.    Cough, Adult A cough helps to clear your throat and lungs. A cough may last only 2-3 weeks (acute), or it may last longer than 8 weeks (chronic). Many different things can cause a cough. A cough may be a sign of an illness or another medical condition. Follow these instructions at home:  Pay attention to any changes in your cough.  Take medicines only as told by your doctor. ? If you were prescribed an antibiotic medicine, take it as told by your doctor. Do not stop taking it even if you start to feel better. ? Talk with your doctor before you try using a cough medicine.  Drink enough fluid to keep your pee (urine) clear or pale yellow.  If the air is dry, use a cold steam vaporizer or humidifier in your home.  Stay away from things that make you cough at work or at home.  If your cough is worse at night, try using extra pillows to raise your head up higher while you sleep.  Do not smoke, and try not to be around smoke. If you need help quitting, ask your doctor.  Do not have caffeine.  Do not drink alcohol.  Rest as needed. Contact a doctor if:  You have new problems (symptoms).  You cough up yellow fluid (pus).  Your cough does not get better after 2-3 weeks, or your cough gets worse.  Medicine does not help your cough and you are not sleeping well.  You have pain that gets worse or pain that is not helped with medicine.  You have a fever.  You are losing weight and you do not know why.  You have night sweats. Get help right away if:  You cough up blood.  You have trouble breathing.  Your heartbeat is very fast. This information is not intended to replace advice given to you by your health care provider. Make sure you discuss any questions you have with your health care provider. Document Released: 12/05/2010 Document  Revised: 08/30/2015 Document Reviewed: 05/31/2014 Elsevier Interactive Patient Education  [employer].

## 2018-01-05 NOTE — Progress Notes (Signed)
Justin Goodman is a 62 y.o. male who presents to [facility] today for cough congestion runny nose body aches.  Symptoms present for about 2 weeks.  Patient denies any severe shortness of breath.  He notes the cough is productive and does interfere with sleep.  He has tried some over-the-counter medications which have not helped much no chest pain palpitations.  He continues to take his regular medications.  He has not had any hypoglycemic episodes during this illness.   ROS as above:  Exam:  BP 131/67   Pulse 90   Temp 98.6 F (37 C) (Oral)   Ht 5' 9" (1.753 m)   Wt 170 lb (77.1 kg)   SpO2 100%   BMI 25.10 kg/m  Wt Readings from Last 5 Encounters:  01/05/18 170 lb (77.1 kg)  12/14/17 166 lb (75.3 kg)  12/03/17 167 lb 9.6 oz (76 kg)  10/22/17 165 lb (74.8 kg)  09/25/17 167 lb (75.8 kg)    Gen: Well NAD HEENT: EOMI,  MMM clear nasal discharge.  Inflamed nasal turbinates.  Normal posterior pharynx.  Normal tympanic membranes bilaterally.  Minimal cervical lymphadenopathy. Lungs: Normal work of breathing. CTABL Heart: RRR no MRG Abd: NABS, Soft. Nondistended, Nontender Exts: Brisk capillary refill, warm and well perfused.   Lab and Radiology Results Two-view chest x-ray pending   Assessment and Plan: 62 y.o. male with cough and congestion.  This is associate with body aches.  Symptoms present for 2 weeks.  Likely early viral bronchitis.  Plan for limited work-up below including CBC metabolic panel and chest x-ray.  We will try blood we will also obtain health maintenance item of HIV and hepatitis C screening.  Empiric treatment with Tessalon Perles.  Continue over-the-counter medications.  Back-up printed azithromycin for use if not improving.  Follow-up if no improvement.   Orders Placed This Encounter  Procedures  . DG Chest 2 View    Order Specific  Question:   Reason for exam:    Answer:   Cough, assess intra-thoracic pathology    Order Specific Question:   Preferred imaging location?    Answer:   [facility]  . CBC with Differential/Platelet  . COMPLETE METABOLIC PANEL WITH GFR  . HIV Antibody (routine testing w rflx)  . Hepatitis C antibody   Meds ordered this encounter  Medications  . benzonatate (TESSALON) 200 MG capsule    Sig: Take 1 capsule (200 mg total) by mouth 3 (three) times daily as needed for cough.    Dispense:  45 capsule    Refill:  0  . azithromycin (ZITHROMAX) 250 MG tablet    Sig: Take 1 tablet (250 mg total) by mouth daily. Take first 2 tablets together, then 1 every day until finished.    Dispense:  6 tablet    Refill:  0     Historical information moved to improve visibility of documentation.  Past Medical History:  Diagnosis Date  . Chronic back pain    only mild loss of T12-L1 disk ht on xray 1-12  . COPD (chronic obstructive pulmonary disease) ([place])   . Diabetes mellitus    w/ neuropathy  . Hypertension   . Smoker    Past Surgical History:  Procedure Laterality Date  . LUMBAR LAMINECTOMY/DECOMPRESSION MICRODISCECTOMY N/A 09/25/2017   Procedure: LAMINECTOMY LUMBAR FOUR- LUMBAR FIVE;  Surgeon: Ashok Pall, MD;  Location: [facility];  Service: Neurosurgery;  Laterality: N/A;  .  UVULOPALATOPHARYNGOPLASTY     2001   Social History   Tobacco Use  . Smoking status: Former Smoker    Packs/day: 1.00    Years: 20.00    Pack years: 20.00    Types: Cigarettes    Last attempt to quit: 2013    Years since quitting: 6.7  . Smokeless tobacco: Never Used  Substance Use Topics  . Alcohol use: No   family history includes Alcohol abuse in his brother and father; Diabetes in his mother; Heart attack in his brother.  Medications: Current Outpatient Medications  Medication Sig Dispense Refill  . ACCU-CHEK FASTCLIX LANCETS MISC Check blood glucose 4 times daily Diagnosis diabetes E11.40 600  each 3  . Alcohol Swabs (B-D SINGLE USE SWABS REGULAR) PADS Check blood glucose 4 times daily Diagnosis diabetes E11.40 600 each 3  . Alpha-Lipoic Acid 200 MG CAPS Take 1 capsule by mouth daily.    Marland Kitchen amitriptyline (ELAVIL) 50 MG tablet TAKE 1 TABLET BY MOUTH AT BEDTIME 90 tablet 3  . aspirin EC 81 MG tablet Take 1 tablet (81 mg total) by mouth daily. 100 tablet 1  . B-D ULTRAFINE III SHORT PEN 31G X 8 MM MISC USE FOR DAILY INSULIN INJECTION AS DIRECTED BY PHYSICIAN. 100 each 4  . Blood Glucose Monitoring Suppl (ACCU-CHEK NANO SMARTVIEW) w/Device KIT Check blood glucose 4 times daily Diagnosis diabetes E11.40 1 kit 0  . gabapentin (NEURONTIN) 600 MG tablet Two tabs PO TID 180 tablet 3  . losartan (COZAAR) 25 MG tablet Take 1 tablet (25 mg total) by mouth daily. 90 tablet 1  . metFORMIN (GLUCOPHAGE) 1000 MG tablet Take 1 tablet (1,000 mg total) by mouth 2 (two) times daily with a meal. 180 tablet 3  . metoprolol tartrate (LOPRESSOR) 50 MG tablet Take 0.5 tablets (25 mg total) by mouth 2 (two) times daily. 180 tablet 3  . omeprazole (PRILOSEC) 40 MG capsule TAKE 1 CAPSULE BY MOUTH ONCE DAILY 90 capsule 4  . pyridoxine (B-6) 100 MG tablet Take 100 mg by mouth daily.    . traZODone (DESYREL) 150 MG tablet Take 1 tablet (150 mg total) by mouth at bedtime. 90 tablet 3  . umeclidinium bromide (INCRUSE ELLIPTA) 62.5 MCG/INH AEPB Inhale 1 puff into the lungs daily. 1 each 11  . azithromycin (ZITHROMAX) 250 MG tablet Take 1 tablet (250 mg total) by mouth daily. Take first 2 tablets together, then 1 every day until finished. 6 tablet 0  . benzonatate (TESSALON) 200 MG capsule Take 1 capsule (200 mg total) by mouth 3 (three) times daily as needed for cough. 45 capsule 0  . TOUJEO SOLOSTAR 300 UNIT/ML SOPN Inject 45 Units into the skin every morning.     No current facility-administered medications for this visit.    Allergies  Allergen Reactions  . Amaryl [Glimepiride] Other (See Comments)    Frequent  hypoglycemia  . Atorvastatin Other (See Comments)    HA   . Cymbalta [Duloxetine Hcl] Other (See Comments)    Dizziness, insomnia  . Glipizide Other (See Comments)    HA  . Losartan Other (See Comments)    Vision changes, HA.   Marland Kitchen Lyrica [Pregabalin] Other (See Comments)    Sedation  . Quinapril Cough  . Simvastatin Other (See Comments)    Severe myalgias.       Discussed warning signs or symptoms. Please see discharge instructions. Patient expresses understanding.

## 2018-01-06 ENCOUNTER — Ambulatory Visit (INDEPENDENT_AMBULATORY_CARE_PROVIDER_SITE_OTHER): Payer: Medicare HMO

## 2018-01-06 DIAGNOSIS — R05 Cough: Secondary | ICD-10-CM

## 2018-01-06 DIAGNOSIS — R5383 Other fatigue: Secondary | ICD-10-CM

## 2018-01-06 DIAGNOSIS — Z114 Encounter for screening for human immunodeficiency virus [HIV]: Secondary | ICD-10-CM | POA: Diagnosis not present

## 2018-01-06 DIAGNOSIS — Z1159 Encounter for screening for other viral diseases: Secondary | ICD-10-CM | POA: Diagnosis not present

## 2018-01-07 LAB — CBC WITH DIFFERENTIAL/PLATELET
BASOS ABS: 21 {cells}/uL (ref 0–200)
Basophils Relative: 0.7 %
EOS PCT: 2 %
Eosinophils Absolute: 60 cells/uL (ref 15–500)
HEMATOCRIT: 42 % (ref 38.5–50.0)
HEMOGLOBIN: 13.9 g/dL (ref 13.2–17.1)
LYMPHS ABS: 882 {cells}/uL (ref 850–3900)
MCH: 27.6 pg (ref 27.0–33.0)
MCHC: 33.1 g/dL (ref 32.0–36.0)
MCV: 83.3 fL (ref 80.0–100.0)
MPV: 9.6 fL (ref 7.5–12.5)
Monocytes Relative: 13.9 %
NEUTROS ABS: 1620 {cells}/uL (ref 1500–7800)
Neutrophils Relative %: 54 %
Platelets: 170 10*3/uL (ref 140–400)
RBC: 5.04 10*6/uL (ref 4.20–5.80)
RDW: 14 % (ref 11.0–15.0)
Total Lymphocyte: 29.4 %
WBC: 3 10*3/uL — ABNORMAL LOW (ref 3.8–10.8)
WBCMIX: 417 {cells}/uL (ref 200–950)

## 2018-01-07 LAB — COMPLETE METABOLIC PANEL WITH GFR
AG RATIO: 1.6 (calc) (ref 1.0–2.5)
ALBUMIN MSPROF: 4.3 g/dL (ref 3.6–5.1)
ALT: 14 U/L (ref 9–46)
AST: 15 U/L (ref 10–35)
Alkaline phosphatase (APISO): 63 U/L (ref 40–115)
BUN: 13 mg/dL (ref 7–25)
CALCIUM: 9.4 mg/dL (ref 8.6–10.3)
CO2: 28 mmol/L (ref 20–32)
CREATININE: 0.77 mg/dL (ref 0.70–1.25)
Chloride: 100 mmol/L (ref 98–110)
GFR, EST NON AFRICAN AMERICAN: 96 mL/min/{1.73_m2} (ref >=60)
GFR, Est African American: 111 mL/min/{1.73_m2} (ref >=60)
GLOBULIN: 2.7 g/dL (ref 1.9–3.7)
Glucose, Bld: 325 mg/dL — ABNORMAL HIGH (ref 65–99)
Potassium: 4.2 mmol/L (ref 3.5–5.3)
SODIUM: 135 mmol/L (ref 135–146)
Total Bilirubin: 0.4 mg/dL (ref 0.2–1.2)
Total Protein: 7 g/dL (ref 6.1–8.1)

## 2018-01-07 LAB — HEPATITIS C ANTIBODY
HEP C AB: NONREACTIVE
SIGNAL TO CUT-OFF: 0.07 (ref <1.00)

## 2018-01-07 LAB — HIV ANTIBODY (ROUTINE TESTING W REFLEX): HIV 1&2 Ab, 4th Generation: NONREACTIVE

## 2018-01-12 ENCOUNTER — Telehealth: Payer: Self-pay

## 2018-01-12 NOTE — Telephone Encounter (Signed)
These look ok except for the 73.  Was that in the morning?

## 2018-01-12 NOTE — Telephone Encounter (Signed)
He is taking 48 units daily of insulin. Fasting blood sugar readings.   [phone]

## 2018-01-12 NOTE — Telephone Encounter (Signed)
Pt states all of the readings given were fasting morning readings.   Pt takes his insulin in the morning after drinking his coffee. Confirmed insulin dose was 48 units

## 2018-01-13 NOTE — Telephone Encounter (Signed)
OK, let decrease insulin to 47 units and tay on that for 2 weeks and then call back with numbers

## 2018-01-13 NOTE — Telephone Encounter (Signed)
Pt advised of recommendations. Will decrease and call back in 2 weeks with readings

## 2018-02-04 ENCOUNTER — Ambulatory Visit: Payer: Medicare HMO | Admitting: Endocrinology

## 2018-02-25 NOTE — Progress Notes (Signed)
We discussed pump therapy, and how it works, and what is expected, being on a pump.  He was shown the three models and we discussed the advantages and disadvantages of each model. He was given brochures of each model and told to go on line to look at each one.  He had no final questions.

## 2018-02-25 NOTE — Patient Instructions (Signed)
REad over information given, and go on line for further information. Call if questions.

## 2018-03-10 ENCOUNTER — Other Ambulatory Visit: Payer: Self-pay | Admitting: Family Medicine

## 2018-03-16 ENCOUNTER — Encounter: Payer: Self-pay | Admitting: Family Medicine

## 2018-03-16 ENCOUNTER — Ambulatory Visit (INDEPENDENT_AMBULATORY_CARE_PROVIDER_SITE_OTHER): Payer: Medicare HMO | Admitting: Family Medicine

## 2018-03-16 VITALS — BP 111/71 | HR 80 | Ht 67.0 in | Wt 172.0 lb

## 2018-03-16 DIAGNOSIS — E1142 Type 2 diabetes mellitus with diabetic polyneuropathy: Secondary | ICD-10-CM | POA: Diagnosis not present

## 2018-03-16 DIAGNOSIS — I10 Essential (primary) hypertension: Secondary | ICD-10-CM

## 2018-03-16 LAB — POCT GLYCOSYLATED HEMOGLOBIN (HGB A1C): HEMOGLOBIN A1C: 8.1 % — AB (ref 4.0–5.6)

## 2018-03-16 MED ORDER — TRAZODONE HCL 150 MG PO TABS: 150.0000 mg | ORAL_TABLET | Freq: Every day | ORAL | 3 refills | Status: DC

## 2018-03-16 MED ORDER — TOUJEO SOLOSTAR 300 UNIT/ML ~~LOC~~ SOPN: 48.0000 [IU] | PEN_INJECTOR | Freq: Every day | SUBCUTANEOUS | 5 refills | Status: DC

## 2018-03-16 MED ORDER — INSULIN ASPART 100 UNIT/ML FLEXPEN: PEN_INJECTOR | SUBCUTANEOUS | 11 refills | Status: DC

## 2018-03-16 NOTE — Progress Notes (Signed)
Subjective:    CC: DM, BP   HPI:  Diabetes - no hypoglycemic events. No wounds or sores that are not healing well. No increased thirst or urination. Checking glucose at home. Taking medications as prescribed without any side effects. 347-48 units of Toujeo.    Hypertension- Pt denies chest pain, SOB, dizziness, or heart palpitations.  Taking meds as directed w/o problems.  Denies medication side effects.    Diabetic peripheral neuropathy-he is currently on amitriptyline.  Also on gabapentin though in the past he said that it was not effective.  He was seen for URI and cough in October and says it took a while to improve but he is better.    Past medical history, Surgical history, Family history not pertinant except as noted below, Social history, Allergies, and medications have been entered into the medical record, reviewed, and corrections made.   Review of Systems: No fevers, chills, night sweats, weight loss, chest pain, or shortness of breath.   Objective:    General: Well Developed, well nourished, and in no acute distress.  Neuro: Alert and oriented x3, extra-ocular muscles intact, sensation grossly intact.  HEENT: Normocephalic, atraumatic  Skin: Warm and dry, no rashes. Cardiac: Regular rate and rhythm, no murmurs rubs or gallops, no lower extremity edema.  Respiratory: Clear to auscultation bilaterally. Not using accessory muscles, speaking in full sentences.   Impression and Recommendations:    DM - Uncontrolled.  Discussed options.  I like to put him on short acting mealtime insulin for his largest meal of the day which he says is his evening meal.  And then continue with his current units on his long-acting insulin.  We will start with just 5 units of short acting.  I like to see him back about 6 weeks and have him bring in his glucose log so we can look over those and make sure is not having any hypoglycemic episodes.  He is not currently exercising but when he does start  exercising he tends to have more low blood sugars.  HTN - Well controlled. Continue current regimen. Follow up in 3-4.  Diabetic Peripheral Neuropathy-continue with amitriptyline.

## 2018-03-16 NOTE — Patient Instructions (Signed)
Bring in glucose log

## 2018-03-19 ENCOUNTER — Other Ambulatory Visit: Payer: Self-pay | Admitting: Family Medicine

## 2018-03-19 DIAGNOSIS — E1142 Type 2 diabetes mellitus with diabetic polyneuropathy: Secondary | ICD-10-CM

## 2018-03-23 ENCOUNTER — Other Ambulatory Visit: Payer: Self-pay | Admitting: *Deleted

## 2018-03-23 DIAGNOSIS — E1142 Type 2 diabetes mellitus with diabetic polyneuropathy: Secondary | ICD-10-CM

## 2018-03-23 DIAGNOSIS — E1165 Type 2 diabetes mellitus with hyperglycemia: Secondary | ICD-10-CM

## 2018-03-23 MED ORDER — FREESTYLE LIBRE 14 DAY READER DEVI: 1.0000 | 0 refills | Status: DC

## 2018-03-23 MED ORDER — FREESTYLE LIBRE 14 DAY SENSOR MISC: 1.0000 | 12 refills | Status: DC

## 2018-04-06 ENCOUNTER — Other Ambulatory Visit: Payer: Self-pay | Admitting: Sports Medicine

## 2018-04-08 ENCOUNTER — Other Ambulatory Visit: Payer: Self-pay | Admitting: Family Medicine

## 2018-04-08 DIAGNOSIS — I10 Essential (primary) hypertension: Secondary | ICD-10-CM

## 2018-04-27 ENCOUNTER — Encounter: Payer: Self-pay | Admitting: Family Medicine

## 2018-04-27 ENCOUNTER — Ambulatory Visit (INDEPENDENT_AMBULATORY_CARE_PROVIDER_SITE_OTHER): Payer: Medicare HMO | Admitting: Family Medicine

## 2018-04-27 VITALS — BP 125/62 | HR 84 | Ht 67.0 in | Wt 176.0 lb

## 2018-04-27 DIAGNOSIS — E1165 Type 2 diabetes mellitus with hyperglycemia: Secondary | ICD-10-CM | POA: Diagnosis not present

## 2018-04-27 DIAGNOSIS — F5101 Primary insomnia: Secondary | ICD-10-CM

## 2018-04-27 MED ORDER — ZOLPIDEM TARTRATE 5 MG PO TABS: 5.0000 mg | ORAL_TABLET | Freq: Every evening | ORAL | 1 refills | Status: DC | PRN

## 2018-04-27 NOTE — Progress Notes (Signed)
Subjective:    CC:   HPI: 63 year old male is here today for follow-up for diabetes.  He is not quite due for his next hemoglobin A1c with the last one was uncontrolled so I had him come in sooner so that we could evaluate his blood sugars.  We started him on short acting insulin about 6 weeks ago with his evening meal.  He has been doing 10 units.  He still continuing to do 46 units on his Toujeo long-acting.  He forgot to bring in his glucose log today but says in general his blood sugars have been running from 150-200s in the mornings.  He has not checked any blood sugar levels right before bedtime.  He also wanted to discuss his sleep.  He says he goes to bed around 930 and takes his trazodone but then is waking up around 2 or 2:30 in the morning and cannot go back to sleep.  So he is getting about 5 hours on the medication but says it is just not enough he wakes up feeling exhausted.   Past medical history, Surgical history, Family history not pertinant except as noted below, Social history, Allergies, and medications have been entered into the medical record, reviewed, and corrections made.   Review of Systems: No fevers, chills, night sweats, weight loss, chest pain, or shortness of breath.   Objective:    General: Well Developed, well nourished, and in no acute distress.  Neuro: Alert and oriented x3, extra-ocular muscles intact, sensation grossly intact.  HEENT: Normocephalic, atraumatic  Skin: Warm and dry, no rashes. Cardiac: Regular rate and rhythm, no murmurs rubs or gallops, no lower extremity edema.  Respiratory: Clear to auscultation bilaterally. Not using accessory muscles, speaking in full sentences.   Impression and Recommendations:   Diabetes-uncontrolled.  So far he is doing well with mealtime insulin and sounds like he has been doing it consistently.  I did ask him to check his blood sugars occurs before bedtime a few times between now and next week and then call me  with those numbers.  We may be able to adjust his mealtime insulin.  Plus the NovoLog cost $95 and he says he really cannot afford that.  We will need to call his pharmacy and see if maybe he just has a deductible and the price will come down or if it may just not be preferred on his insurance plan.  . Insomnia - discussed options. Discontinue trazodone and switch to low-dose Ambien.  Warned about potential for sedation sleepwalking etc.  Stop immediately fading any untoward side effects

## 2018-04-27 NOTE — Patient Instructions (Addendum)
Ok to stop the trazodone.   Will try the ambien (zolpidem) for sleep.   Check your sugar at bedtime a few time and then call me with those numbers early next week

## 2018-04-28 ENCOUNTER — Telehealth: Payer: Self-pay | Admitting: *Deleted

## 2018-04-28 NOTE — Telephone Encounter (Signed)
Spoke w/Joe and he told me that pt last p/u this medication on 03/22/18 and he paid $29.46 this was a 28 day supply. He said that a 30 day supply is $45 and anything over even 1 day is $90. If he wanted a 90 day supply it would be $935.  It is 300 units per pen. I told him that pt is now taking 10 units. He stated that this is still a 30 day supply.Laureen Ochs, Viann Shove, CMA

## 2018-05-05 DIAGNOSIS — M25561 Pain in right knee: Secondary | ICD-10-CM | POA: Diagnosis not present

## 2018-05-05 DIAGNOSIS — M25562 Pain in left knee: Secondary | ICD-10-CM | POA: Diagnosis not present

## 2018-05-06 ENCOUNTER — Other Ambulatory Visit: Payer: Self-pay | Admitting: Sports Medicine

## 2018-05-13 ENCOUNTER — Telehealth: Payer: Self-pay | Admitting: Family Medicine

## 2018-05-13 ENCOUNTER — Telehealth: Payer: Self-pay

## 2018-05-13 DIAGNOSIS — E1142 Type 2 diabetes mellitus with diabetic polyneuropathy: Secondary | ICD-10-CM

## 2018-05-13 DIAGNOSIS — M48061 Spinal stenosis, lumbar region without neurogenic claudication: Secondary | ICD-10-CM

## 2018-05-13 MED ORDER — INSULIN ASPART 100 UNIT/ML FLEXPEN: PEN_INJECTOR | SUBCUTANEOUS | 3 refills | Status: DC

## 2018-05-13 MED ORDER — GABAPENTIN 600 MG PO TABS: 1200.0000 mg | ORAL_TABLET | Freq: Three times a day (TID) | ORAL | 1 refills | Status: DC

## 2018-05-13 NOTE — Telephone Encounter (Signed)
[facility]. Type of Specialist:  [facility]

## 2018-05-13 NOTE — Telephone Encounter (Signed)
Justin Goodman called and states the Ambien is not working. He went back on the Trazodone. He now needs a refill on the Trazodone.

## 2018-05-13 NOTE — Telephone Encounter (Signed)
OK, rx pended. Not sur if local or mail order.

## 2018-05-13 NOTE — Telephone Encounter (Signed)
Routing to PCP

## 2018-05-13 NOTE — Telephone Encounter (Signed)
Ok to place referral.

## 2018-05-14 MED ORDER — TRAZODONE HCL 150 MG PO TABS: 75.0000 mg | ORAL_TABLET | Freq: Every day | ORAL | 3 refills | Status: DC

## 2018-05-14 NOTE — Telephone Encounter (Signed)
Spoke with Pt, he wants this to go to [facility]. Rx sent.

## 2018-05-14 NOTE — Telephone Encounter (Signed)
Spoke with Pt, he wants referral to Neurology for his neuropathy pain. Referral placed. Prefers [place] or [facility] location.

## 2018-06-15 ENCOUNTER — Other Ambulatory Visit: Payer: Self-pay

## 2018-06-15 MED ORDER — BD SWAB SINGLE USE REGULAR PADS: MEDICATED_PAD | 3 refills | Status: DC

## 2018-06-15 MED ORDER — INSULIN PEN NEEDLE 31G X 8 MM MISC: 4 refills | Status: DC

## 2018-06-22 ENCOUNTER — Encounter: Payer: Self-pay | Admitting: Family Medicine

## 2018-06-22 ENCOUNTER — Other Ambulatory Visit: Payer: Self-pay

## 2018-06-22 ENCOUNTER — Ambulatory Visit (INDEPENDENT_AMBULATORY_CARE_PROVIDER_SITE_OTHER): Payer: Medicare HMO | Admitting: Family Medicine

## 2018-06-22 VITALS — BP 136/69 | HR 81 | Ht 67.0 in | Wt 169.0 lb

## 2018-06-22 DIAGNOSIS — E1142 Type 2 diabetes mellitus with diabetic polyneuropathy: Secondary | ICD-10-CM

## 2018-06-22 DIAGNOSIS — M25562 Pain in left knee: Secondary | ICD-10-CM

## 2018-06-22 DIAGNOSIS — I10 Essential (primary) hypertension: Secondary | ICD-10-CM | POA: Diagnosis not present

## 2018-06-22 DIAGNOSIS — F5101 Primary insomnia: Secondary | ICD-10-CM

## 2018-06-22 DIAGNOSIS — M25561 Pain in right knee: Secondary | ICD-10-CM | POA: Diagnosis not present

## 2018-06-22 LAB — POCT GLYCOSYLATED HEMOGLOBIN (HGB A1C): Hemoglobin A1C: 8.8 % — AB (ref 4.0–5.6)

## 2018-06-22 MED ORDER — METOPROLOL TARTRATE 50 MG PO TABS: ORAL_TABLET | ORAL | 3 refills | Status: DC

## 2018-06-22 MED ORDER — LOSARTAN POTASSIUM 50 MG PO TABS: 50.0000 mg | ORAL_TABLET | Freq: Every day | ORAL | 0 refills | Status: DC

## 2018-06-22 MED ORDER — TOUJEO SOLOSTAR 300 UNIT/ML ~~LOC~~ SOPN: 48.0000 [IU] | PEN_INJECTOR | Freq: Every day | SUBCUTANEOUS | 5 refills | Status: DC

## 2018-06-22 MED ORDER — METFORMIN HCL 1000 MG PO TABS: 1000.0000 mg | ORAL_TABLET | Freq: Two times a day (BID) | ORAL | 3 refills | Status: DC

## 2018-06-22 NOTE — Patient Instructions (Signed)
Give Novolog 5 units before breakfast and 10 units before evening meal.

## 2018-06-22 NOTE — Progress Notes (Signed)
Subjective:    CC:   HPI: Diabetes - no hypoglycemic events. No wounds or sores that are not healing well. No increased thirst or urination. Checking glucose at home. Taking medications as prescribed without any side effects.  Follow-up insomnia-when I last saw him he wanted to try something besides the trazodone so I switched him to low-dose Ambien.  He actually had called back back in February and said he wanted to switch back to the trazodone.  Hypertension- Pt denies chest pain, SOB, dizziness, or heart palpitations.  Taking meds as directed w/o problems.  Denies medication side effects.    He went to see Dr. Orson Aloe for his knees.  He said he was given a prescription for meloxicam 7.5 mg to take twice a day as needed he brought the bottle in with him today.  He says though that they did x-rays and was told that the knee pain is likely more neuropathic.   Past medical history, Surgical history, Family history not pertinant except as noted below, Social history, Allergies, and medications have been entered into the medical record, reviewed, and corrections made.   Review of Systems: No fevers, chills, night sweats, weight loss, chest pain, or shortness of breath.   Objective:    General: Well Developed, well nourished, and in no acute distress.  Neuro: Alert and oriented x3, extra-ocular muscles intact, sensation grossly intact.  HEENT: Normocephalic, atraumatic  Skin: Warm and dry, no rashes. Cardiac: Regular rate and rhythm, no murmurs rubs or gallops, no lower extremity edema.  Respiratory: Clear to auscultation bilaterally. Not using accessory muscles, speaking in full sentences.   Impression and Recommendations:    DM - Uncontrolled. A1C is 8.8 today.  We discussed going up on his Toujeo but he was very hesitant to do that because in the past that has caused some hypoglycemia so we decided to add 5 units of NovoLog before breakfast and continue with 10 before his evening meal.   We will likely try to go up to 10 and 10 but for now just want him to start with 5 units and will go from there.  Insomnia-he is now back on trazodone.  Felt Like the Ambien actually caused insomnia.  HTN - Well controlled. Continue current regimen. Follow up in  3-4 months.    Bilateral knee pain felt to be secondary to his neuropathy he still taking the meloxicam and feels like it is helpful.

## 2018-06-23 ENCOUNTER — Other Ambulatory Visit: Payer: Self-pay | Admitting: *Deleted

## 2018-06-23 DIAGNOSIS — E1142 Type 2 diabetes mellitus with diabetic polyneuropathy: Secondary | ICD-10-CM

## 2018-06-23 DIAGNOSIS — E785 Hyperlipidemia, unspecified: Secondary | ICD-10-CM

## 2018-06-23 LAB — LIPID PANEL
Cholesterol: 196 mg/dL (ref <200)
HDL: 36 mg/dL — AB (ref >=40)
LDL Cholesterol (Calc): 129 mg/dL (calc) — ABNORMAL HIGH
Non-HDL Cholesterol (Calc): 160 mg/dL (calc) — ABNORMAL HIGH (ref <130)
TRIGLYCERIDES: 178 mg/dL — AB (ref <150)
Total CHOL/HDL Ratio: 5.4 (calc) — ABNORMAL HIGH (ref <5.0)

## 2018-06-23 LAB — COMPLETE METABOLIC PANEL WITH GFR
AG Ratio: 1.4 (calc) (ref 1.0–2.5)
ALKALINE PHOSPHATASE (APISO): 65 U/L (ref 35–144)
ALT: 13 U/L (ref 9–46)
AST: 16 U/L (ref 10–35)
Albumin: 4.3 g/dL (ref 3.6–5.1)
BILIRUBIN TOTAL: 0.4 mg/dL (ref 0.2–1.2)
BUN: 11 mg/dL (ref 7–25)
CO2: 27 mmol/L (ref 20–32)
Calcium: 10.3 mg/dL (ref 8.6–10.3)
Chloride: 100 mmol/L (ref 98–110)
Creat: 0.77 mg/dL (ref 0.70–1.25)
GFR, Est African American: 111 mL/min/{1.73_m2} (ref >=60)
GFR, Est Non African American: 96 mL/min/{1.73_m2} (ref >=60)
Globulin: 3 g/dL (calc) (ref 1.9–3.7)
Glucose, Bld: 233 mg/dL — ABNORMAL HIGH (ref 65–99)
Potassium: 4.4 mmol/L (ref 3.5–5.3)
Sodium: 135 mmol/L (ref 135–146)
Total Protein: 7.3 g/dL (ref 6.1–8.1)

## 2018-06-23 MED ORDER — INSULIN PEN NEEDLE 31G X 8 MM MISC: 4 refills | Status: DC

## 2018-06-23 MED ORDER — PRAVASTATIN SODIUM 20 MG PO TABS: ORAL_TABLET | ORAL | 3 refills | Status: DC

## 2018-06-23 NOTE — Addendum Note (Signed)
Addended by: Nani Gasser D on: 06/23/2018 02:29 PM   Modules accepted: Orders

## 2018-09-07 DIAGNOSIS — M48061 Spinal stenosis, lumbar region without neurogenic claudication: Secondary | ICD-10-CM | POA: Diagnosis not present

## 2018-09-07 DIAGNOSIS — E0821 Diabetes mellitus due to underlying condition with diabetic nephropathy: Secondary | ICD-10-CM | POA: Diagnosis not present

## 2018-09-07 DIAGNOSIS — I10 Essential (primary) hypertension: Secondary | ICD-10-CM | POA: Diagnosis not present

## 2018-09-07 DIAGNOSIS — G629 Polyneuropathy, unspecified: Secondary | ICD-10-CM | POA: Diagnosis not present

## 2018-09-09 ENCOUNTER — Other Ambulatory Visit: Payer: Self-pay | Admitting: *Deleted

## 2018-09-09 MED ORDER — OMEPRAZOLE 40 MG PO CPDR: 40.0000 mg | DELAYED_RELEASE_CAPSULE | Freq: Every day | ORAL | 4 refills | Status: DC

## 2018-09-09 MED ORDER — TIZANIDINE HCL 4 MG PO TABS: 4.0000 mg | ORAL_TABLET | Freq: Four times a day (QID) | ORAL | 0 refills | Status: DC | PRN

## 2018-09-15 ENCOUNTER — Encounter: Payer: Self-pay | Admitting: Family Medicine

## 2018-09-15 ENCOUNTER — Ambulatory Visit (INDEPENDENT_AMBULATORY_CARE_PROVIDER_SITE_OTHER): Payer: Medicare HMO | Admitting: Family Medicine

## 2018-09-15 VITALS — Ht 67.0 in | Wt 172.0 lb

## 2018-09-15 DIAGNOSIS — E1142 Type 2 diabetes mellitus with diabetic polyneuropathy: Secondary | ICD-10-CM | POA: Diagnosis not present

## 2018-09-15 DIAGNOSIS — I10 Essential (primary) hypertension: Secondary | ICD-10-CM

## 2018-09-15 DIAGNOSIS — M48062 Spinal stenosis, lumbar region with neurogenic claudication: Secondary | ICD-10-CM | POA: Diagnosis not present

## 2018-09-15 NOTE — Progress Notes (Signed)
Pt reports that he stopped taking the Novalog  56 U of the Toujeo  Pt would like to d/c the pravastatin...  Eye exam UTD will call for report.  Maryruth Eve, Lahoma Crocker, CMA

## 2018-09-15 NOTE — Progress Notes (Signed)
Virtual Visit via telephone note  I connected with Justin Goodman on 09/15/18 at  9:30 AM EDT by a telephone enabled telemedicine application and verified that I am speaking with the correct person using two identifiers.   I discussed the limitations of evaluation and management by telemedicine and the availability of in person appointments. The patient expressed understanding and agreed to proceed.  Subjective:    CC:   HPI:  Hypertension- Pt denies chest pain, SOB, dizziness, or heart palpitations.  Taking meds as directed w/o problems.  Denies medication side effects.    Diabetes - no hypoglycemic events. No wounds or sores that are not healing well. No increased thirst or urination. Checking glucose at home. Taking medications as prescribed without any side effects. Pt reports that he stopped taking the Novalog bc his sugars were low.  56 U of the Toujeo. Glucose was 234, 287, 89, 168, 180.   He did consult with neurology on June 2 for his neuropathy.  He had been followed there previously but have been about 4 years since he had been back.  Was felt that he has a combination of peripheral neuropathy as well as superimposed radiculopathy from lumbar stenosis.  They recommended obtaining EMG/nerve conduction studies and recommended increasing his gabapentin to 900 mg 3 times a day.   Past medical history, Surgical history, Family history not pertinant except as noted below, Social history, Allergies, and medications have been entered into the medical record, reviewed, and corrections made.   Review of Systems: No fevers, chills, night sweats, weight loss, chest pain, or shortness of breath.   Objective:    General: Speaking clearly in complete sentences without any shortness of breath.  Alert and oriented x3.  Normal judgment. No apparent acute distress.   Impression and Recommendations:    HTN -  Unable to check home BP.  He is asymptomatic though.  DM - due for A1C.  Little bit  concerned that he stopped his NovoLog.  He was doing believe 6 units in the morning and 10 units at night but had a couple of episodes of of glycemia such as stopped it completely.  He does tend to swing very widely.  He can go to the lab when he can. F/U in 3-4 months.  He really wants to discontinue his statin but I did discuss continuing it because of the benefit for reduction in heart attack and stroke even with normal cholesterol numbers.  He says he is willing to continue it he is only taking it twice a week at this point.    Peripheral neuropathy/radiculopathy secondary to spinal stenosis-following with neurology.  Recent adjustment to his gabapentin about a week ago.  Additional work-up recommended.  He is scheduled for further testing at the end of the month.    Time spent in non-face-to-face encounter approximately 22 minutes.  I discussed the assessment and treatment plan with the patient. The patient was provided an opportunity to ask questions and all were answered. The patient agreed with the plan and demonstrated an understanding of the instructions.   The patient was advised to call back or seek an in-person evaluation if the symptoms worsen or if the condition fails to improve as anticipated.   Beatrice Lecher, MD

## 2018-09-23 ENCOUNTER — Other Ambulatory Visit: Payer: Self-pay | Admitting: Family Medicine

## 2018-09-27 ENCOUNTER — Other Ambulatory Visit: Payer: Self-pay | Admitting: Family Medicine

## 2018-09-27 DIAGNOSIS — I10 Essential (primary) hypertension: Secondary | ICD-10-CM

## 2018-09-29 DIAGNOSIS — G629 Polyneuropathy, unspecified: Secondary | ICD-10-CM | POA: Diagnosis not present

## 2018-09-30 ENCOUNTER — Other Ambulatory Visit: Payer: Self-pay | Admitting: *Deleted

## 2018-09-30 DIAGNOSIS — M48061 Spinal stenosis, lumbar region without neurogenic claudication: Secondary | ICD-10-CM

## 2018-09-30 NOTE — Telephone Encounter (Signed)
[facility] from [facility] came in for refill wanting to know if he could get this refilled.   Looked back and Dr. Darene Lamer was writing this for him will route this to him for refill.Justin Goodman Justin Goodman

## 2018-10-05 ENCOUNTER — Telehealth: Payer: Self-pay | Admitting: Family Medicine

## 2018-10-05 NOTE — Telephone Encounter (Signed)
Justin Goodman states he was given 300 mg capsules of gabapentin to take 3 capsules (total 900 mg) 3 times daily by Cammy Brochure, MD. He would like to go back on his original dose of gabapentin 600 mg to take 2 capsules (total 1,200 mg) 3 times. [facility]. Please advise.

## 2018-10-05 NOTE — Telephone Encounter (Signed)
Pt called requesting his Zabapentin 600 be called in to [facility] in [place] as soon as possible

## 2018-10-06 MED ORDER — GABAPENTIN 300 MG PO CAPS: 600.0000 mg | ORAL_CAPSULE | Freq: Three times a day (TID) | ORAL | 5 refills | Status: DC

## 2018-10-06 MED ORDER — GABAPENTIN 600 MG PO TABS: 1200.0000 mg | ORAL_TABLET | Freq: Three times a day (TID) | ORAL | 2 refills | Status: DC

## 2018-10-06 NOTE — Telephone Encounter (Signed)
Osgood sorry correct. I misunderstood. New rx sent remind him we backed off on that dose bc of side effects but I will send over.

## 2018-10-06 NOTE — Addendum Note (Signed)
Addended by: Beatrice Lecher D on: 10/06/2018 01:52 PM   Modules accepted: Orders

## 2018-10-06 NOTE — Telephone Encounter (Signed)
Bocephus wants the 600 mg tablets to take 2 tablets 3 times daily of the Gabapentin. He doesn't like the 300 mg tablets. He states they do not work.

## 2018-10-06 NOTE — Addendum Note (Signed)
Addended by: Beatrice Lecher D on: 10/06/2018 02:07 PM   Modules accepted: Orders

## 2018-10-06 NOTE — Telephone Encounter (Signed)
OK, new rx sent.  

## 2018-10-06 NOTE — Telephone Encounter (Signed)
Pt advised.

## 2018-11-08 ENCOUNTER — Other Ambulatory Visit: Payer: Self-pay | Admitting: Family Medicine

## 2018-11-10 ENCOUNTER — Other Ambulatory Visit: Payer: Self-pay | Admitting: Neurology

## 2018-11-12 ENCOUNTER — Other Ambulatory Visit: Payer: Self-pay | Admitting: Physician Assistant

## 2018-11-12 MED ORDER — ZOLPIDEM TARTRATE 5 MG PO TABS: 5.0000 mg | ORAL_TABLET | Freq: Every evening | ORAL | 0 refills | Status: DC | PRN

## 2018-11-12 NOTE — Progress Notes (Signed)
Requested 90 day supply of 5mg  ambien sent with no refills.

## 2018-11-18 ENCOUNTER — Other Ambulatory Visit: Payer: Self-pay | Admitting: Physician Assistant

## 2018-11-20 ENCOUNTER — Other Ambulatory Visit: Payer: Self-pay | Admitting: Family Medicine

## 2018-12-05 ENCOUNTER — Other Ambulatory Visit: Payer: Self-pay | Admitting: Family Medicine

## 2018-12-10 ENCOUNTER — Other Ambulatory Visit: Payer: Self-pay | Admitting: Physician Assistant

## 2018-12-10 NOTE — Telephone Encounter (Signed)
Last filled #[phone] with no refills 11/08/2018 by Luvenia Starch.  Please advise.

## 2018-12-16 ENCOUNTER — Ambulatory Visit: Payer: Medicare HMO | Admitting: Family Medicine

## 2018-12-21 ENCOUNTER — Other Ambulatory Visit: Payer: Self-pay | Admitting: Family Medicine

## 2018-12-21 NOTE — Telephone Encounter (Signed)
Please call the pharmacy, this prescription was just filled 8 days ago.  I am not sure if they did not receive the prescription or if he has not picked it up.

## 2018-12-22 ENCOUNTER — Ambulatory Visit (INDEPENDENT_AMBULATORY_CARE_PROVIDER_SITE_OTHER): Payer: Medicare HMO | Admitting: Family Medicine

## 2018-12-22 ENCOUNTER — Other Ambulatory Visit: Payer: Self-pay

## 2018-12-22 ENCOUNTER — Encounter: Payer: Self-pay | Admitting: Family Medicine

## 2018-12-22 VITALS — BP 130/71 | HR 84 | Temp 98.4°F | Ht 67.0 in | Wt 171.0 lb

## 2018-12-22 DIAGNOSIS — J449 Chronic obstructive pulmonary disease, unspecified: Secondary | ICD-10-CM | POA: Diagnosis not present

## 2018-12-22 DIAGNOSIS — F5101 Primary insomnia: Secondary | ICD-10-CM | POA: Diagnosis not present

## 2018-12-22 DIAGNOSIS — E1142 Type 2 diabetes mellitus with diabetic polyneuropathy: Secondary | ICD-10-CM

## 2018-12-22 DIAGNOSIS — Z23 Encounter for immunization: Secondary | ICD-10-CM

## 2018-12-22 DIAGNOSIS — I10 Essential (primary) hypertension: Secondary | ICD-10-CM

## 2018-12-22 LAB — POCT GLYCOSYLATED HEMOGLOBIN (HGB A1C): Hemoglobin A1C: 7.4 % — AB (ref 4.0–5.6)

## 2018-12-22 MED ORDER — ZOLPIDEM TARTRATE 10 MG PO TABS: 10.0000 mg | ORAL_TABLET | Freq: Every evening | ORAL | 1 refills | Status: DC | PRN

## 2018-12-22 NOTE — Assessment & Plan Note (Signed)
New prescription sent to the pharmacy for 10 mg Ambien.  Monitor for any side effects.  Call if any problems or concerns.

## 2018-12-22 NOTE — Assessment & Plan Note (Signed)
A1c looks much better today it is down to 7.4 which is a great improvement from 8.8.  He is mostly been focusing on cutting back on portions and he actually stopped his 5 units of NovoLog before his evening meal he was having some hypoglycemic events that actually seems to have regulated the sugar little bit better.  Follow-up in 3 months.  Just encouraged him to continue to work on being active.  Normally he goes to the gym pretty regularly but has not been able to do that during the [place] pandemic.

## 2018-12-22 NOTE — Progress Notes (Signed)
Established Patient Office Visit  Subjective:  Patient ID: Justin Goodman, male    DOB: 1955/08/16  Age: 62 y.o. MRN: [id number]  CC:  Chief Complaint  Patient presents with  . Diabetes    HPI Justin Goodman presents for  Diabetes - no hypoglycemic events. No wounds or sores that are not healing well. No increased thirst or urination. Checking glucose at home. Taking medications as prescribed without any side effects.  Using 44 units of Toujeo.  He also quit taking the NovoLog 5 units before his evening meals.  He was getting some hypoglycemic events and feels like his sugars actually been much better regulated since stopping that.  He has not been able to go to the gym but says he is been trying to stay active.  Hypertension- Pt denies chest pain, SOB, dizziness, or heart palpitations.  Taking meds as directed w/o problems.  Denies medication side effects.    F/U insomnia - says the Lorrin Mais is not working well for him. Wants to know if can try the 75m dose.  He says he is tried taking 2 of the 5 mg together and says that seems to work well and he has not noticed any problems or side effects.  Past Medical History:  Diagnosis Date  . Chronic back pain    only mild loss of T12-L1 disk ht on xray 1-12  . COPD (chronic obstructive pulmonary disease) ([facility]   . Diabetes mellitus    w/ neuropathy  . Hypertension   . Smoker     Past Surgical History:  Procedure Laterality Date  . LUMBAR LAMINECTOMY/DECOMPRESSION MICRODISCECTOMY N/A 09/25/2017   Procedure: LAMINECTOMY LUMBAR FOUR- LUMBAR FIVE;  Surgeon: CAshok Pall MD;  [facility]  Service: Neurosurgery;  Laterality: N/A;  . UVULOPALATOPHARYNGOPLASTY     2001    Family History  Problem Relation Age of Onset  . Diabetes Mother   . Alcohol abuse Father        Liver disease killed father @ age 65  .Marland KitchenAlcohol abuse Brother   . Heart attack Brother        Died at age 65    Social History   Socioeconomic History  .  Marital status: Single    Spouse name: Not on file  . Number of children: Not on file  . Years of education: Not on file  . Highest education level: Not on file  Occupational History  . Not on file  Social Needs  . Financial resource strain: Not on file  . Food insecurity    Worry: Not on file    Inability: Not on file  . Transportation needs    Medical: Not on file    Non-medical: Not on file  Tobacco Use  . Smoking status: Former Smoker    Packs/day: 1.00    Years: 20.00    Pack years: 20.00    Types: Cigarettes    Quit date: 2013    Years since quitting: 7.7  . Smokeless tobacco: Never Used  Substance and Sexual Activity  . Alcohol use: No  . Drug use: No  . Sexual activity: Yes  Lifestyle  . Physical activity    Days per week: Not on file    Minutes per session: Not on file  . Stress: Not on file  Relationships  . Social [place] phone: Not on file    Gets together: Not on file    Attends religious service: Not on  file    Active member of club or organization: Not on file    Attends meetings of clubs or organizations: Not on file    Relationship status: Not on file  . Intimate partner violence    Fear of current or ex partner: Not on file    Emotionally abused: Not on file    Physically abused: Not on file    Forced sexual activity: Not on file  Other Topics Concern  . Not on file  Social History Narrative  . Not on file    Outpatient Medications Prior to Visit  Medication Sig Dispense Refill  . ACCU-CHEK FASTCLIX LANCETS MISC Check blood glucose 4 times daily Diagnosis diabetes E11.40 600 each 3  . Alcohol Swabs (B-D SINGLE USE SWABS REGULAR) PADS Check blood glucose 4 times daily Diagnosis diabetes E11.40 600 each 3  . amitriptyline (ELAVIL) 50 MG tablet TAKE 1 TABLET BY MOUTH EVERYDAY AT BEDTIME 90 tablet 1  . Blood Glucose Monitoring Suppl (ACCU-CHEK NANO SMARTVIEW) w/Device KIT Check blood glucose 4 times daily Diagnosis diabetes E11.40  1 kit 0  . gabapentin (NEURONTIN) 600 MG tablet Take 2 tablets (1,200 mg total) by mouth 3 (three) times daily. 180 tablet 2  . Insulin Pen Needle (B-D ULTRAFINE III SHORT PEN) 31G X 8 MM MISC USE FOR DAILY INSULIN INJECTION AS DIRECTED BY PHYSICIAN. DX E11.40 100 each 4  . losartan (COZAAR) 50 MG tablet TAKE 1 TABLET BY MOUTH EVERY DAY 90 tablet 1  . meloxicam (MOBIC) 7.5 MG tablet     . metFORMIN (GLUCOPHAGE) 1000 MG tablet Take 1 tablet (1,000 mg total) by mouth 2 (two) times daily with a meal. 180 tablet 3  . metoprolol tartrate (LOPRESSOR) 50 MG tablet TAKE 1/2 TABLET BY MOUTH TWICE A DAY 90 tablet 1  . omeprazole (PRILOSEC) 40 MG capsule Take 1 capsule (40 mg total) by mouth daily. 90 capsule 4  . pravastatin (PRAVACHOL) 20 MG tablet 1 tab po twice per week at bedtime. 24 tablet 3  . tiZANidine (ZANAFLEX) 4 MG tablet TAKE 1 TABLET BY MOUTH EVERY 6 HOURS AS NEEDED FOR MUSCLE SPASM 60 tablet 0  . TOUJEO SOLOSTAR 300 UNIT/ML SOPN Inject 48 Units into the skin daily. 6 mL 5  . traZODone (DESYREL) 150 MG tablet Take 0.5-1 tablets (75-150 mg total) by mouth at bedtime. 90 tablet 3  . zolpidem (AMBIEN) 5 MG tablet Take 1 tablet (5 mg total) by mouth at bedtime as needed for sleep. 90 tablet 0  . Alpha-Lipoic Acid 200 MG CAPS Take 1 capsule by mouth daily.    Marland Kitchen aspirin EC 81 MG tablet Take 1 tablet (81 mg total) by mouth daily. 100 tablet 1  . gabapentin (NEURONTIN) 300 MG capsule Take 2 capsules (600 mg total) by mouth 3 (three) times daily. 180 capsule 5  . insulin aspart (NOVOLOG FLEXPEN) 100 UNIT/ML FlexPen 5 units SQ about 10 minutes before evening meal. (Patient not taking: Reported on 09/15/2018) 9 mL 3  . pyridoxine (B-6) 100 MG tablet Take 100 mg by mouth daily.     No facility-administered medications prior to visit.     Allergies  Allergen Reactions  . Amaryl [Glimepiride] Other (See Comments)    Frequent hypoglycemia  . Atorvastatin Other (See Comments)    HA   . Cymbalta  [Duloxetine Hcl] Other (See Comments)    Dizziness, insomnia  . Glipizide Other (See Comments)    HA  . Losartan Other (See Comments)  Vision changes, HA.   Marland Kitchen Lyrica [Pregabalin] Other (See Comments)    Sedation  . Quinapril Cough  . Simvastatin Other (See Comments)    Severe myalgias.      ROS Review of Systems    Objective:    Physical Exam  Constitutional: He is oriented to person, place, and time. He appears well-developed and well-nourished.  HENT:  Head: Normocephalic and atraumatic.  Right Ear: External ear normal.  Left Ear: External ear normal.  Cardiovascular: Normal rate, regular rhythm and normal heart sounds.  Pulmonary/Chest: Effort normal and breath sounds normal.  Neurological: He is alert and oriented to person, place, and time.  Skin: Skin is warm and dry.  Psychiatric: He has a normal mood and affect. His behavior is normal.    BP 130/71   Pulse 84   Temp 98.4 F (36.9 C)   Ht 5' 7"  (1.702 m)   Wt 171 lb (77.6 kg)   SpO2 100%   BMI 26.78 kg/m  Wt Readings from Last 3 Encounters:  12/22/18 171 lb (77.6 kg)  09/15/18 172 lb (78 kg)  06/22/18 169 lb (76.7 kg)     Health Maintenance Due  Topic Date Due  . COLON CANCER SCREENING ANNUAL FOBT  08/13/2017  . FOOT EXAM  07/31/2018  . OPHTHALMOLOGY EXAM  08/12/2018  . PNA vac Low Risk Adult (1 of 2 - PCV13) 08/29/2018  . INFLUENZA VACCINE  11/06/2018    There are no preventive care reminders to display for this patient.  Lab Results  Component Value Date   TSH 2.11 08/12/2016   Lab Results  Component Value Date   WBC 3.0 (L) 01/06/2018   HGB 13.9 01/06/2018   HCT 42.0 01/06/2018   MCV 83.3 01/06/2018   PLT 170 01/06/2018   Lab Results  Component Value Date   NA 135 06/22/2018   K 4.4 06/22/2018   CO2 27 06/22/2018   GLUCOSE 233 (H) 06/22/2018   BUN 11 06/22/2018   CREATININE 0.77 06/22/2018   BILITOT 0.4 06/22/2018   ALKPHOS 51 08/12/2016   AST 16 06/22/2018   ALT 13  06/22/2018   PROT 7.3 06/22/2018   ALBUMIN 4.5 08/12/2016   CALCIUM 10.3 06/22/2018   ANIONGAP 5 09/18/2017   Lab Results  Component Value Date   CHOL 196 06/22/2018   Lab Results  Component Value Date   HDL 36 (L) 06/22/2018   Lab Results  Component Value Date   LDLCALC 129 (H) 06/22/2018   Lab Results  Component Value Date   TRIG 178 (H) 06/22/2018   Lab Results  Component Value Date   CHOLHDL 5.4 (H) 06/22/2018   Lab Results  Component Value Date   HGBA1C 7.4 (A) 12/22/2018      Assessment & Plan:   Problem List Items Addressed This Visit      Cardiovascular and Mediastinum   ESSENTIAL HYPERTENSION, BENIGN    Well controlled. Continue current regimen. Follow up in  6 months      Relevant Orders   BASIC METABOLIC PANEL WITH GFR     Endocrine   Well controlled type 2 diabetes mellitus with peripheral neuropathy ([place]) - Primary    A1c looks much better today it is down to 7.4 which is a great improvement from 8.8.  He is mostly been focusing on cutting back on portions and he actually stopped his 5 units of NovoLog before his evening meal he was having some hypoglycemic events that actually seems to  have regulated the sugar little bit better.  Follow-up in 3 months.  Just encouraged him to continue to work on being active.  Normally he goes to the gym pretty regularly but has not been able to do that during the [place] pandemic.      Relevant Medications   zolpidem (AMBIEN) 10 MG tablet   Other Relevant Orders   POCT glycosylated hemoglobin (Hb A1C) (Completed)   Ambulatory referral to Ophthalmology   BASIC METABOLIC PANEL WITH GFR     Other   Insomnia    New prescription sent to the pharmacy for 10 mg Ambien.  Monitor for any side effects.  Call if any problems or concerns.       Other Visit Diagnoses    Need for immunization against influenza       Relevant Orders   Flu Vaccine QUAD High Dose(Fluad) (Completed)      Meds ordered this encounter   Medications  . zolpidem (AMBIEN) 10 MG tablet    Sig: Take 1 tablet (10 mg total) by mouth at bedtime as needed for sleep.    Dispense:  90 tablet    Refill:  1    Follow-up: Return in about 3 months (around 03/23/2019) for Diabetes follow-up.    Beatrice Lecher, MD

## 2018-12-22 NOTE — Assessment & Plan Note (Signed)
Well controlled. Continue current regimen. Follow up in  6 months.  

## 2018-12-23 LAB — BASIC METABOLIC PANEL WITH GFR
BUN: 13 mg/dL (ref 7–25)
CO2: 28 mmol/L (ref 20–32)
Calcium: 9.8 mg/dL (ref 8.6–10.3)
Chloride: 103 mmol/L (ref 98–110)
Creat: 0.78 mg/dL (ref 0.70–1.25)
GFR, Est African American: 110 mL/min/{1.73_m2} (ref >=60)
GFR, Est Non African American: 95 mL/min/{1.73_m2} (ref >=60)
Glucose, Bld: 133 mg/dL — ABNORMAL HIGH (ref 65–99)
Potassium: 4.2 mmol/L (ref 3.5–5.3)
Sodium: 139 mmol/L (ref 135–146)

## 2018-12-23 NOTE — Progress Notes (Signed)
All labs are normal. 

## 2019-01-09 ENCOUNTER — Other Ambulatory Visit: Payer: Self-pay | Admitting: Family Medicine

## 2019-01-12 ENCOUNTER — Other Ambulatory Visit: Payer: Self-pay | Admitting: Family Medicine

## 2019-01-12 NOTE — Telephone Encounter (Signed)
Pt is requesting a 90 day supply. Ok to change and send refill for this?Justin Goodman, Lahoma Crocker, CMA

## 2019-01-20 ENCOUNTER — Other Ambulatory Visit: Payer: Self-pay | Admitting: Family Medicine

## 2019-01-31 ENCOUNTER — Other Ambulatory Visit: Payer: Self-pay | Admitting: Family Medicine

## 2019-02-21 ENCOUNTER — Other Ambulatory Visit: Payer: Self-pay | Admitting: Family Medicine

## 2019-02-22 ENCOUNTER — Other Ambulatory Visit: Payer: Self-pay | Admitting: Family Medicine

## 2019-03-01 ENCOUNTER — Other Ambulatory Visit: Payer: Self-pay | Admitting: Family Medicine

## 2019-03-01 NOTE — Telephone Encounter (Signed)
Ok to change to 90 day?Justin Goodman, Lahoma Crocker, CMA

## 2019-03-23 ENCOUNTER — Encounter: Payer: Self-pay | Admitting: Family Medicine

## 2019-03-23 ENCOUNTER — Ambulatory Visit (INDEPENDENT_AMBULATORY_CARE_PROVIDER_SITE_OTHER): Payer: Medicare HMO | Admitting: Family Medicine

## 2019-03-23 DIAGNOSIS — R05 Cough: Secondary | ICD-10-CM | POA: Diagnosis not present

## 2019-03-23 DIAGNOSIS — I10 Essential (primary) hypertension: Secondary | ICD-10-CM | POA: Diagnosis not present

## 2019-03-23 DIAGNOSIS — E1142 Type 2 diabetes mellitus with diabetic polyneuropathy: Secondary | ICD-10-CM | POA: Diagnosis not present

## 2019-03-23 DIAGNOSIS — M79602 Pain in left arm: Secondary | ICD-10-CM

## 2019-03-23 DIAGNOSIS — W108XXA Fall (on) (from) other stairs and steps, initial encounter: Secondary | ICD-10-CM | POA: Diagnosis not present

## 2019-03-23 MED ORDER — AZITHROMYCIN 250 MG PO TABS: ORAL_TABLET | ORAL | 0 refills | Status: AC

## 2019-03-23 NOTE — Assessment & Plan Note (Signed)
Fasting are too low.  Drop Toujeo to 38 units.  F/U in 3 mo.

## 2019-03-23 NOTE — Assessment & Plan Note (Signed)
Will monitor at next OV.  No home BP.

## 2019-03-23 NOTE — Progress Notes (Signed)
Cough x 4 weeks. He hasn't been taking anything OTC for this. He stated that he hasn't been able to cough anything up and feels that it may be in his chest and would like an ABX for this. Denies f/s/c/n/v/d/body aches,headaches,loss of taste/smell.   He also informed me that he fell this morning on the stairs and he has pain in the backside of his L arm and back. He asked about pain medication. I advised him to start alternating between ice and heat and also advised that he has medication on hand to use for his pain.

## 2019-03-23 NOTE — Progress Notes (Signed)
Established Patient Office Visit  Subjective:  Patient ID: Justin Goodman, male    DOB: May 12, 1955  Age: 63 y.o. MRN: [id number]  CC:  Chief Complaint  Patient presents with  . Diabetes    HPI [name] presents for   Hypertension- Pt denies chest pain, SOB, dizziness, or heart palpitations.  Taking meds as directed w/o problems.  Denies medication side effects.    Diabetes - + hypoglycemic events, esp in the AM. No wounds or sores that are not healing well. No increased thirst or urination. Checking glucose at home. Taking medications as prescribed without any side effects. Using Toujeo 44 units.   Cough x 4 weeks. He hasn't been taking anything OTC for this. He stated that he hasn't been able to cough anything up and feels that it may be in his chest and would like an ABX for this. Denies f/s/c/n/v/d/body aches,headaches,loss of taste/smell. Mucous is green and thick.  He denies any shortness of breath.  He feels like the cough is deep in his chest.  He also informed me that he fell this morning on the stairs and he has pain in the backside of his L arm. He asked about pain medication. I advised him to start alternating between ice and heat and also advised that he has medication on hand to use for his pain.  Denies any cuts or bleeding.  He has not looked to see if it is bruised.  He can move his shoulder and elbow normally.  He did not injure his back.  Past Medical History:  Diagnosis Date  . Chronic back pain    only mild loss of T12-L1 disk ht on xray 1-12  . COPD (chronic obstructive pulmonary disease) ([place])   . Diabetes mellitus    w/ neuropathy  . Hypertension   . Smoker     Past Surgical History:  Procedure Laterality Date  . LUMBAR LAMINECTOMY/DECOMPRESSION MICRODISCECTOMY N/A 09/25/2017   Procedure: LAMINECTOMY LUMBAR FOUR- LUMBAR FIVE;  Surgeon: Ashok Pall, MD;  Location: [facility];  Service: Neurosurgery;  Laterality: N/A;  . UVULOPALATOPHARYNGOPLASTY     2001    Family History  Problem Relation Age of Onset  . Diabetes Mother   . Alcohol abuse Father        Liver disease killed father @ age 30.  Marland Kitchen Alcohol abuse Brother   . Heart attack Brother        Died at age 69.    Social History   Socioeconomic History  . Marital status: Single    Spouse name: Not on file  . Number of children: Not on file  . Years of education: Not on file  . Highest education level: Not on file  Occupational History  . Not on file  Tobacco Use  . Smoking status: Former Smoker    Packs/day: 1.00    Years: 20.00    Pack years: 20.00    Types: Cigarettes    Quit date: 2013    Years since quitting: 7.9  . Smokeless tobacco: Never Used  Substance and Sexual Activity  . Alcohol use: No  . Drug use: No  . Sexual activity: Yes  Other Topics Concern  . Not on file  Social History Narrative  . Not on file   Social Determinants of Health   Financial Resource Strain:   . Difficulty of Paying Living Expenses: Not on file  Food Insecurity:   . Worried About Charity fundraiser in the Last Year:  Not on file  . Ran Out of Food in the Last Year: Not on file  Transportation Needs:   . Lack of Transportation (Medical): Not on file  . Lack of Transportation (Non-Medical): Not on file  Physical Activity:   . Days of Exercise per Week: Not on file  . Minutes of Exercise per Session: Not on file  Stress:   . Feeling of Stress : Not on file  Social Connections:   . Frequency of Communication with Friends and Family: Not on file  . Frequency of Social Gatherings with Friends and Family: Not on file  . Attends Religious Services: Not on file  . Active Member of Clubs or Organizations: Not on file  . Attends Archivist Meetings: Not on file  . Marital Status: Not on file  Intimate Partner Violence:   . Fear of Current or Ex-Partner: Not on file  . Emotionally Abused: Not on file  . Physically Abused: Not on file  . Sexually Abused: Not on file     Outpatient Medications Prior to Visit  Medication Sig Dispense Refill  . ACCU-CHEK FASTCLIX LANCETS MISC Check blood glucose 4 times daily Diagnosis diabetes E11.40 600 each 3  . Alcohol Swabs (B-D SINGLE USE SWABS REGULAR) PADS Check blood glucose 4 times daily Diagnosis diabetes E11.40 600 each 3  . amitriptyline (ELAVIL) 50 MG tablet TAKE 1 TABLET BY MOUTH EVERYDAY AT BEDTIME 90 tablet 1  . Blood Glucose Monitoring Suppl (ACCU-CHEK NANO SMARTVIEW) w/Device KIT Check blood glucose 4 times daily Diagnosis diabetes E11.40 1 kit 0  . gabapentin (NEURONTIN) 600 MG tablet TAKE 2 TABLETS BY MOUTH 3 TIMES A DAY 540 tablet 0  . Insulin Pen Needle (B-D ULTRAFINE III SHORT PEN) 31G X 8 MM MISC USE FOR DAILY INSULIN INJECTION AS DIRECTED BY PHYSICIAN. DX E11.40 100 each 4  . losartan (COZAAR) 50 MG tablet TAKE 1 TABLET BY MOUTH EVERY DAY 90 tablet 1  . meloxicam (MOBIC) 7.5 MG tablet Take 7.5 mg by mouth daily.     . metFORMIN (GLUCOPHAGE) 1000 MG tablet Take 1 tablet (1,000 mg total) by mouth 2 (two) times daily with a meal. 180 tablet 3  . metoprolol tartrate (LOPRESSOR) 50 MG tablet TAKE 1/2 TABLET BY MOUTH TWICE A DAY 90 tablet 1  . omeprazole (PRILOSEC) 40 MG capsule Take 1 capsule (40 mg total) by mouth daily. 90 capsule 4  . pravastatin (PRAVACHOL) 20 MG tablet 1 tab po twice per week at bedtime. 24 tablet 3  . tiZANidine (ZANAFLEX) 4 MG tablet Take 1 tablet (4 mg total) by mouth 2 (two) times daily as needed for muscle spasms. 180 tablet 0  . TOUJEO SOLOSTAR 300 UNIT/ML SOPN INJECT 48 UNITS SUBCUTANEOUSLY ONCE DAILY 6 pen 3  . traZODone (DESYREL) 150 MG tablet Take 0.5-1 tablets (75-150 mg total) by mouth at bedtime. 90 tablet 3  . zolpidem (AMBIEN) 10 MG tablet Take 1 tablet (10 mg total) by mouth at bedtime as needed for sleep. 90 tablet 1   No facility-administered medications prior to visit.    Allergies  Allergen Reactions  . Amaryl [Glimepiride] Other (See Comments)    Frequent  hypoglycemia  . Atorvastatin Other (See Comments)    HA   . Cymbalta [Duloxetine Hcl] Other (See Comments)    Dizziness, insomnia  . Glipizide Other (See Comments)    HA  . Losartan Other (See Comments)    Vision changes, HA.   Marland Kitchen Lyrica [Pregabalin] Other (See  Comments)    Sedation  . Quinapril Cough  . Simvastatin Other (See Comments)    Severe myalgias.      ROS Review of Systems    Objective:    Physical Exam  There were no vitals taken for this visit. Wt Readings from Last 3 Encounters:  12/22/18 171 lb (77.6 kg)  09/15/18 172 lb (78 kg)  06/22/18 169 lb (76.7 kg)     Health Maintenance Due  Topic Date Due  . COLON CANCER SCREENING ANNUAL FOBT  08/13/2017  . OPHTHALMOLOGY EXAM  08/12/2018  . PNA vac Low Risk Adult (1 of 2 - PCV13) 08/29/2018    There are no preventive care reminders to display for this patient.  Lab Results  Component Value Date   TSH 2.11 08/12/2016   Lab Results  Component Value Date   WBC 3.0 (L) 01/06/2018   HGB 13.9 01/06/2018   HCT 42.0 01/06/2018   MCV 83.3 01/06/2018   PLT 170 01/06/2018   Lab Results  Component Value Date   NA 139 12/22/2018   K 4.2 12/22/2018   CO2 28 12/22/2018   GLUCOSE 133 (H) 12/22/2018   BUN 13 12/22/2018   CREATININE 0.78 12/22/2018   BILITOT 0.4 06/22/2018   ALKPHOS 51 08/12/2016   AST 16 06/22/2018   ALT 13 06/22/2018   PROT 7.3 06/22/2018   ALBUMIN 4.5 08/12/2016   CALCIUM 9.8 12/22/2018   ANIONGAP 5 09/18/2017   Lab Results  Component Value Date   CHOL 196 06/22/2018   Lab Results  Component Value Date   HDL 36 (L) 06/22/2018   Lab Results  Component Value Date   LDLCALC 129 (H) 06/22/2018   Lab Results  Component Value Date   TRIG 178 (H) 06/22/2018   Lab Results  Component Value Date   CHOLHDL 5.4 (H) 06/22/2018   Lab Results  Component Value Date   HGBA1C 7.4 (A) 12/22/2018      Assessment & Plan:   Problem List Items Addressed This Visit      Cardiovascular  and Mediastinum   ESSENTIAL HYPERTENSION, BENIGN - Primary    Will monitor at next OV.  No home BP.         Endocrine   Well controlled type 2 diabetes mellitus with peripheral neuropathy (HCC)    Fasting are too low.  Drop Toujeo to 38 units.  F/U in 3 mo.         Other Visit Diagnoses    Cough       Fall (on) (from) other stairs and steps, initial encounter       Left arm pain          Cough-most consistent with bronchitis.  Persistent symptoms x4 weeks.  We will go ahead and call in azithromycin if not better after that and please give [name] a call back and will recommend additional work-up including chest x-ray.  Left arm pain status post fall down his steps.  Recommend icing.  He feels like he has good range of motion.  If not improving over the next week or 2 then please let us know and we can evaluate in person.  Meds ordered this encounter  Medications  . azithromycin (ZITHROMAX) 250 MG tablet    Sig: 2 Ttabs PO on Day 1, then one a day x 4 days.    Dispense:  6 tablet    Refill:  0    Follow-up: No follow-ups on file.  Beatrice Lecher, MD

## 2019-03-24 DIAGNOSIS — S32018A Other fracture of first lumbar vertebra, initial encounter for closed fracture: Secondary | ICD-10-CM | POA: Diagnosis not present

## 2019-03-24 DIAGNOSIS — S32029A Unspecified fracture of second lumbar vertebra, initial encounter for closed fracture: Secondary | ICD-10-CM | POA: Diagnosis not present

## 2019-03-24 DIAGNOSIS — E119 Type 2 diabetes mellitus without complications: Secondary | ICD-10-CM | POA: Diagnosis not present

## 2019-03-24 DIAGNOSIS — F17210 Nicotine dependence, cigarettes, uncomplicated: Secondary | ICD-10-CM | POA: Diagnosis not present

## 2019-03-24 DIAGNOSIS — S32028A Other fracture of second lumbar vertebra, initial encounter for closed fracture: Secondary | ICD-10-CM | POA: Diagnosis not present

## 2019-03-24 DIAGNOSIS — G8911 Acute pain due to trauma: Secondary | ICD-10-CM | POA: Diagnosis not present

## 2019-03-24 DIAGNOSIS — I10 Essential (primary) hypertension: Secondary | ICD-10-CM | POA: Diagnosis not present

## 2019-03-24 DIAGNOSIS — S32009A Unspecified fracture of unspecified lumbar vertebra, initial encounter for closed fracture: Secondary | ICD-10-CM | POA: Diagnosis not present

## 2019-03-25 DIAGNOSIS — E1142 Type 2 diabetes mellitus with diabetic polyneuropathy: Secondary | ICD-10-CM | POA: Diagnosis not present

## 2019-03-26 LAB — HEMOGLOBIN A1C
Hgb A1c MFr Bld: 6.8 % of total Hgb — ABNORMAL HIGH (ref <5.7)
Mean Plasma Glucose: 148 (calc)
eAG (mmol/L): 8.2 (calc)

## 2019-03-28 ENCOUNTER — Telehealth: Payer: Self-pay | Admitting: Family Medicine

## 2019-03-28 NOTE — Telephone Encounter (Signed)
Pt aware of HgbA1C results from 03/25/2019.

## 2019-03-28 NOTE — Telephone Encounter (Signed)
Justin Goodman returned your call.  Thanks.

## 2019-03-29 ENCOUNTER — Other Ambulatory Visit: Payer: Self-pay

## 2019-03-29 ENCOUNTER — Other Ambulatory Visit: Payer: Self-pay | Admitting: Family Medicine

## 2019-03-29 ENCOUNTER — Ambulatory Visit (INDEPENDENT_AMBULATORY_CARE_PROVIDER_SITE_OTHER): Payer: Medicare HMO

## 2019-03-29 ENCOUNTER — Ambulatory Visit (INDEPENDENT_AMBULATORY_CARE_PROVIDER_SITE_OTHER): Payer: Medicare HMO | Admitting: Family Medicine

## 2019-03-29 ENCOUNTER — Encounter: Payer: Self-pay | Admitting: Family Medicine

## 2019-03-29 VITALS — BP 118/69 | HR 88 | Ht 66.0 in | Wt 167.0 lb

## 2019-03-29 DIAGNOSIS — W009XXA Unspecified fall due to ice and snow, initial encounter: Secondary | ICD-10-CM | POA: Diagnosis not present

## 2019-03-29 DIAGNOSIS — S22009A Unspecified fracture of unspecified thoracic vertebra, initial encounter for closed fracture: Secondary | ICD-10-CM

## 2019-03-29 DIAGNOSIS — S299XXA Unspecified injury of thorax, initial encounter: Secondary | ICD-10-CM | POA: Diagnosis not present

## 2019-03-29 DIAGNOSIS — R0781 Pleurodynia: Secondary | ICD-10-CM

## 2019-03-29 MED ORDER — KETOROLAC TROMETHAMINE 60 MG/2ML IM SOLN
60.0000 mg | Freq: Once | INTRAMUSCULAR | Status: AC
  Administered 2019-03-29: 11:00:00 60 mg via INTRAMUSCULAR

## 2019-03-29 MED ORDER — TRAMADOL HCL 50 MG PO TABS: 50.0000 mg | ORAL_TABLET | Freq: Three times a day (TID) | ORAL | 0 refills | Status: AC | PRN

## 2019-03-29 NOTE — Progress Notes (Signed)
Pt fell on 03/23/2019 on his steps due to the ice. When he fell he landed on his L shoulder and L side.  Pt has tried heat and ice to help with the pain and he had some oxy 5 mg and methocarbomol 500 mg and these caused drowsiness and loss of appetite.   His pain is 10/10, constant and sharp.

## 2019-03-29 NOTE — Patient Instructions (Signed)
Rib Fracture ° °A rib fracture is a break or crack in one of the bones of the ribs. The ribs are like a cage that goes around your upper chest. A broken or cracked rib is often painful, but most do not cause other problems. Most rib fractures usually heal on their own in 1-3 months. °Follow these instructions at home: °Managing pain, stiffness, and swelling °· If directed, apply ice to the injured area. °? Put ice in a plastic bag. °? Place a towel between your skin and the bag. °? Leave the ice on for 20 minutes, 2-3 times a day. °· Take over-the-counter and prescription medicines only as told by your doctor. °Activity °· Avoid activities that cause pain to the injured area. Protect your injured area. °· Slowly increase activity as told by your doctor. °General instructions °· Do deep breathing as told by your doctor. You may be told to: °? Take deep breaths many times a day. °? Cough many times a day while hugging a pillow. °? Use a device (incentive spirometer) to do deep breathing many times a day. °· Drink enough fluid to keep your pee (urine) clear or pale yellow. °· Do not wear a rib belt or binder. These do not allow you to breathe deeply. °· Keep all follow-up visits as told by your doctor. This is important. °Contact a doctor if: °· You have a fever. °Get help right away if: °· You have trouble breathing. °· You are short of breath. °· You cannot stop coughing. °· You cough up thick or bloody spit (sputum). °· You feel sick to your stomach (nauseous), throw up (vomit), or have belly (abdominal) pain. °· Your pain gets worse and medicine does not help. °Summary °· A rib fracture is a break or crack in one of the bones of the ribs. °· Apply ice to the injured area and take medicines for pain as told by your doctor. °· Take deep breaths and cough many times a day. Hug a pillow every time you cough. °This information is not intended to replace advice given to you by your health care provider. Make sure you  discuss any questions you have with your health care provider. °Document Released: 01/01/2008 Document Revised: 03/06/2017 Document Reviewed: 06/24/2016 °Elsevier Patient Education © 2020 [employer] ° °

## 2019-03-29 NOTE — Progress Notes (Signed)
Established Patient Office Visit  Subjective:  Patient ID: Justin Goodman, male    DOB: [birth year]  Age: 63 y.o. MRN: [id number]  CC:  Chief Complaint  Patient presents with  . Back Pain    HPI Justin Goodman presents for low back pain.  He does have a history of underlying chronic low back pain but unfortunately he fell on 1216.  He slipped on some ice on some stairs outside of his home.  At that time we actually had a virtual visit he complained mostly about his arm.  But the following day he went to the emergency department for low back pain.  CT of thoracic spine showed a transverse process nondisplaced fracture at L1 on the left and of L2 transverse process, nondisplaced as well.  He was given Robaxin 500 mg up to 2 tablets 3 times a day as well as oxycodone 5 mg immediate release to take 1 or 2 tabs every 4 hours as needed as well as some nausea medicine.  He reports that he is really not having a lot of back pain.  He is having pain over his left lower posterrior ribs. Pain with sneeze and cough.  He took meds he was given in ED and says they really don't help. Tried heat and ice with no relief. His pain is 10/10, constant and sharp.   CT SPINE THORACIC WO IV CONTRAST  Narrative:  COMPARISON: None.  INDICATION: fall from five stairs, thoracic and lumbar spine  TECHNIQUE: Radiation dose reduction was utilized (automated exposure control, mA or kV adjustment based on patient size, or iterative image reconstruction).  CT SPINE THORACIC WO IV CONTRAST, CT SPINE LUMBAR WO IV CONTRAST. Coronal and sagittal reformatted imaging was performed. Radiation dose reduction was utilized (automated exposure control, mA or kV adjustment based on patient size, or iterative image  reconstruction).  OSSEOUS STRUCTURES: There is a nondisplaced fracture involving the transverse process at L1 on the left (series 3, image 715). There is also a nondisplaced fracture involving the L2 transverse process on  series 11, image 34 in the lumbar spine images.  Vertebral body heights are preserved. Spinal alignment is preserved. No destructive osseous lesions. There is mild degenerative disease throughout the spine and at the sacroiliac joints.   SOFT TISSUES: No perivertebral soft tissue abnormalities.  ADDITIONAL/INCIDENTAL: N./A.  Impression:  IMPRESSION:  Nondisplaced fractures involving the left transverse process at L1 and L2.  Electronically Signed by: Teodoro Kil  CT SPINE LUMBAR WO IV CONTRAST  Narrative:  COMPARISON: None.  INDICATION: fall from five stairs, thoracic and lumbar spine  TECHNIQUE: Radiation dose reduction was utilized (automated exposure control, mA or kV adjustment based on patient size, or iterative image reconstruction).  CT SPINE THORACIC WO IV CONTRAST, CT SPINE LUMBAR WO IV CONTRAST. Coronal and sagittal reformatted imaging was performed. Radiation dose reduction was utilized (automated exposure control, mA or kV adjustment based on patient size, or iterative image  reconstruction).  OSSEOUS STRUCTURES: There is a nondisplaced fracture involving the transverse process at L1 on the left (series 3, image 715). There is also a nondisplaced fracture involving the L2 transverse process on series 11, image 34 in the lumbar spine images.  Vertebral body heights are preserved. Spinal alignment is preserved. No destructive osseous lesions. There is mild degenerative disease throughout the spine and at the sacroiliac joints.   SOFT TISSUES: No perivertebral soft tissue abnormalities.  ADDITIONAL/INCIDENTAL: N./A.  Impression:  IMPRESSION:  Nondisplaced fractures involving the left transverse  process at L1 and L2.  Electronically Signed by: Teodoro Kil     Past Medical History:  Diagnosis Date  . Chronic back pain    only mild loss of T12-L1 disk ht on xray 1-12  . COPD (chronic obstructive pulmonary disease) ([place])   . Diabetes mellitus    w/  neuropathy  . Hypertension   . Smoker     Past Surgical History:  Procedure Laterality Date  . LUMBAR LAMINECTOMY/DECOMPRESSION MICRODISCECTOMY N/A 09/25/2017   Procedure: LAMINECTOMY LUMBAR FOUR- LUMBAR FIVE;  Surgeon: Ashok Pall, MD;  Location: [facility];  Service: Neurosurgery;  Laterality: N/A;  . UVULOPALATOPHARYNGOPLASTY     2001    Family History  Problem Relation Age of Onset  . Diabetes Mother   . Alcohol abuse Father        Liver disease killed father @ age 26.  Marland Kitchen Alcohol abuse Brother   . Heart attack Brother        Died at age 28.    Social History   Socioeconomic History  . Marital status: Single    Spouse name: Not on file  . Number of children: Not on file  . Years of education: Not on file  . Highest education level: Not on file  Occupational History  . Not on file  Tobacco Use  . Smoking status: Former Smoker    Packs/day: 1.00    Years: 20.00    Pack years: 20.00    Types: Cigarettes    Quit date: 2013    Years since quitting: 7.9  . Smokeless tobacco: Never Used  Substance and Sexual Activity  . Alcohol use: No  . Drug use: No  . Sexual activity: Yes  Other Topics Concern  . Not on file  Social History Narrative  . Not on file   Social Determinants of Health   Financial Resource Strain:   . Difficulty of Paying Living Expenses: Not on file  Food Insecurity:   . Worried About Charity fundraiser in the Last Year: Not on file  . Ran Out of Food in the Last Year: Not on file  Transportation Needs:   . Lack of Transportation (Medical): Not on file  . Lack of Transportation (Non-Medical): Not on file  Physical Activity:   . Days of Exercise per Week: Not on file  . Minutes of Exercise per Session: Not on file  Stress:   . Feeling of Stress : Not on file  Social Connections:   . Frequency of Communication with Friends and Family: Not on file  . Frequency of Social Gatherings with Friends and Family: Not on file  . Attends Religious  Services: Not on file  . Active Member of Clubs or Organizations: Not on file  . Attends Archivist Meetings: Not on file  . Marital Status: Not on file  Intimate Partner Violence:   . Fear of Current or Ex-Partner: Not on file  . Emotionally Abused: Not on file  . Physically Abused: Not on file  . Sexually Abused: Not on file    Outpatient Medications Prior to Visit  Medication Sig Dispense Refill  . ACCU-CHEK FASTCLIX LANCETS MISC Check blood glucose 4 times daily Diagnosis diabetes E11.40 600 each 3  . Alcohol Swabs (B-D SINGLE USE SWABS REGULAR) PADS Check blood glucose 4 times daily Diagnosis diabetes E11.40 600 each 3  . amitriptyline (ELAVIL) 50 MG tablet TAKE 1 TABLET BY MOUTH EVERYDAY AT BEDTIME 90 tablet 1  . Blood  Glucose Monitoring Suppl (ACCU-CHEK NANO SMARTVIEW) w/Device KIT Check blood glucose 4 times daily Diagnosis diabetes E11.40 1 kit 0  . gabapentin (NEURONTIN) 600 MG tablet TAKE 2 TABLETS BY MOUTH 3 TIMES A DAY 540 tablet 0  . Insulin Pen Needle (B-D ULTRAFINE III SHORT PEN) 31G X 8 MM MISC USE FOR DAILY INSULIN INJECTION AS DIRECTED BY PHYSICIAN. DX E11.40 100 each 4  . losartan (COZAAR) 50 MG tablet TAKE 1 TABLET BY MOUTH EVERY DAY 90 tablet 1  . meloxicam (MOBIC) 7.5 MG tablet Take 7.5 mg by mouth daily.     . metFORMIN (GLUCOPHAGE) 1000 MG tablet Take 1 tablet (1,000 mg total) by mouth 2 (two) times daily with a meal. 180 tablet 3  . metoprolol tartrate (LOPRESSOR) 50 MG tablet TAKE 1/2 TABLET BY MOUTH TWICE A DAY 90 tablet 1  . omeprazole (PRILOSEC) 40 MG capsule Take 1 capsule (40 mg total) by mouth daily. 90 capsule 4  . pravastatin (PRAVACHOL) 20 MG tablet 1 tab po twice per week at bedtime. 24 tablet 3  . tiZANidine (ZANAFLEX) 4 MG tablet Take 1 tablet (4 mg total) by mouth 2 (two) times daily as needed for muscle spasms. 180 tablet 0  . TOUJEO SOLOSTAR 300 UNIT/ML SOPN INJECT 48 UNITS SUBCUTANEOUSLY ONCE DAILY 6 pen 3  . traZODone (DESYREL) 150 MG  tablet Take 0.5-1 tablets (75-150 mg total) by mouth at bedtime. 90 tablet 3  . zolpidem (AMBIEN) 10 MG tablet Take 1 tablet (10 mg total) by mouth at bedtime as needed for sleep. 90 tablet 1   No facility-administered medications prior to visit.    Allergies  Allergen Reactions  . Amaryl [Glimepiride] Other (See Comments)    Frequent hypoglycemia  . Atorvastatin Other (See Comments)    HA   . Cymbalta [Duloxetine Hcl] Other (See Comments)    Dizziness, insomnia  . Glipizide Other (See Comments)    HA  . Losartan Other (See Comments)    Vision changes, HA.   Marland Kitchen Lyrica [Pregabalin] Other (See Comments)    Sedation  . Quinapril Cough  . Simvastatin Other (See Comments)    Severe myalgias.      ROS Review of Systems    Objective:    Physical Exam  Constitutional: He is oriented to person, place, and time. He appears well-developed and well-nourished.  HENT:  Head: Normocephalic and atraumatic.  Eyes: Conjunctivae and EOM are normal.  Cardiovascular: Normal rate, regular rhythm and normal heart sounds.  Pulmonary/Chest: Effort normal and breath sounds normal.  Musculoskeletal:     Comments: Nontender over the thoracic and lumbar spine. nontender over the SI joints.  He is very tender over the posterior lower ribs.  nontender over anterior chest wall.   Neurological: He is alert and oriented to person, place, and time.  Skin: Skin is warm and dry. No pallor.  Psychiatric: He has a normal mood and affect. His behavior is normal.  Vitals reviewed.   BP 118/69   Pulse 88   Ht 5' 6"  (1.676 m)   Wt 167 lb (75.8 kg)   SpO2 100%   BMI 26.95 kg/m  Wt Readings from Last 3 Encounters:  03/29/19 167 lb (75.8 kg)  12/22/18 171 lb (77.6 kg)  09/15/18 172 lb (78 kg)     Health Maintenance Due  Topic Date Due  . COLON CANCER SCREENING ANNUAL FOBT  08/13/2017  . OPHTHALMOLOGY EXAM  08/12/2018  . PNA vac Low Risk Adult (1 of 2 -  PCV13) 08/29/2018    There are no preventive  care reminders to display for this patient.  Lab Results  Component Value Date   TSH 2.11 08/12/2016   Lab Results  Component Value Date   WBC 3.0 (L) 01/06/2018   HGB 13.9 01/06/2018   HCT 42.0 01/06/2018   MCV 83.3 01/06/2018   PLT 170 01/06/2018   Lab Results  Component Value Date   NA 139 12/22/2018   K 4.2 12/22/2018   CO2 28 12/22/2018   GLUCOSE 133 (H) 12/22/2018   BUN 13 12/22/2018   CREATININE 0.78 12/22/2018   BILITOT 0.4 06/22/2018   ALKPHOS 51 08/12/2016   AST 16 06/22/2018   ALT 13 06/22/2018   PROT 7.3 06/22/2018   ALBUMIN 4.5 08/12/2016   CALCIUM 9.8 12/22/2018   ANIONGAP 5 09/18/2017   Lab Results  Component Value Date   CHOL 196 06/22/2018   Lab Results  Component Value Date   HDL 36 (L) 06/22/2018   Lab Results  Component Value Date   LDLCALC 129 (H) 06/22/2018   Lab Results  Component Value Date   TRIG 178 (H) 06/22/2018   Lab Results  Component Value Date   CHOLHDL 5.4 (H) 06/22/2018   Lab Results  Component Value Date   HGBA1C 6.8 (H) 03/25/2019      Assessment & Plan:   Problem List Items Addressed This Visit    None    Visit Diagnoses    Closed fracture of transverse process of thoracic vertebra, initial encounter ([place])    -  Primary   Relevant Medications   traMADol (ULTRAM) 50 MG tablet   ketorolac (TORADOL) injection 60 mg (Completed)   Rib pain on left side       Relevant Medications   traMADol (ULTRAM) 50 MG tablet   ketorolac (TORADOL) injection 60 mg (Completed)   Fall due to slipping on ice or snow, initial encounter         Rib pain s/p fall - will evaluate for fracture. He is very tender.  Will call w/ results. Rec heat/ice and avoid straining.  Will cal  In tramadil. Has used before and did ok with it.    Transverse fracture - really not painful today.   Meds ordered this encounter  Medications  . traMADol (ULTRAM) 50 MG tablet    Sig: Take 1-2 tablets (50-100 mg total) by mouth every 8 (eight) hours as  needed for up to 5 days for severe pain.    Dispense:  20 tablet    Refill:  0  . ketorolac (TORADOL) injection 60 mg    Follow-up: No follow-ups on file.    Beatrice Lecher, MD

## 2019-03-30 ENCOUNTER — Encounter: Payer: Self-pay | Admitting: Family Medicine

## 2019-03-31 ENCOUNTER — Other Ambulatory Visit: Payer: Self-pay | Admitting: Family Medicine

## 2019-03-31 DIAGNOSIS — I10 Essential (primary) hypertension: Secondary | ICD-10-CM

## 2019-04-04 ENCOUNTER — Telehealth: Payer: Self-pay | Admitting: Family Medicine

## 2019-04-04 MED ORDER — HYDROCODONE-ACETAMINOPHEN 5-325 MG PO TABS: 1.0000 | ORAL_TABLET | Freq: Four times a day (QID) | ORAL | 0 refills | Status: DC | PRN

## 2019-04-04 NOTE — Telephone Encounter (Signed)
Patient called, stating that he was seen last Tuesday for back pain, with his PCP, and that his medication that was prescribed for pain is not working, and wanted to know if he could be prescribed something different. Please Advise.

## 2019-04-04 NOTE — Telephone Encounter (Signed)
Call pt and advised him of recommendations for Hydrocodone-Acetaminophen 5-325 mg. He asked if the dosage could be higher. I told him that Dr. Madilyn Fireman will make that decision about that and told him that this would only be for a short term. He voiced understanding.Elouise Munroe, [place]

## 2019-04-19 DIAGNOSIS — I10 Essential (primary) hypertension: Secondary | ICD-10-CM | POA: Diagnosis not present

## 2019-04-19 DIAGNOSIS — Z0100 Encounter for examination of eyes and vision without abnormal findings: Secondary | ICD-10-CM | POA: Diagnosis not present

## 2019-04-19 DIAGNOSIS — E7800 Pure hypercholesterolemia, unspecified: Secondary | ICD-10-CM | POA: Diagnosis not present

## 2019-04-19 DIAGNOSIS — Z135 Encounter for screening for eye and ear disorders: Secondary | ICD-10-CM | POA: Diagnosis not present

## 2019-04-19 DIAGNOSIS — E113293 Type 2 diabetes mellitus with mild nonproliferative diabetic retinopathy without macular edema, bilateral: Secondary | ICD-10-CM | POA: Diagnosis not present

## 2019-04-20 ENCOUNTER — Other Ambulatory Visit: Payer: Self-pay | Admitting: *Deleted

## 2019-04-20 MED ORDER — BLOOD GLUCOSE METER KIT: PACK | 0 refills | Status: DC

## 2019-05-15 ENCOUNTER — Other Ambulatory Visit: Payer: Self-pay | Admitting: Family Medicine

## 2019-05-23 ENCOUNTER — Other Ambulatory Visit: Payer: Self-pay | Admitting: Family Medicine

## 2019-06-02 ENCOUNTER — Other Ambulatory Visit: Payer: Self-pay | Admitting: Family Medicine

## 2019-06-16 ENCOUNTER — Other Ambulatory Visit: Payer: Self-pay | Admitting: Family Medicine

## 2019-06-27 ENCOUNTER — Encounter: Payer: Self-pay | Admitting: Family Medicine

## 2019-06-27 ENCOUNTER — Other Ambulatory Visit: Payer: Self-pay

## 2019-06-27 ENCOUNTER — Ambulatory Visit (INDEPENDENT_AMBULATORY_CARE_PROVIDER_SITE_OTHER): Payer: Medicare HMO | Admitting: Family Medicine

## 2019-06-27 VITALS — BP 133/66 | HR 89 | Ht 66.0 in | Wt 168.0 lb

## 2019-06-27 DIAGNOSIS — E1142 Type 2 diabetes mellitus with diabetic polyneuropathy: Secondary | ICD-10-CM

## 2019-06-27 DIAGNOSIS — J449 Chronic obstructive pulmonary disease, unspecified: Secondary | ICD-10-CM | POA: Diagnosis not present

## 2019-06-27 DIAGNOSIS — M48062 Spinal stenosis, lumbar region with neurogenic claudication: Secondary | ICD-10-CM | POA: Diagnosis not present

## 2019-06-27 DIAGNOSIS — F5101 Primary insomnia: Secondary | ICD-10-CM | POA: Diagnosis not present

## 2019-06-27 DIAGNOSIS — I10 Essential (primary) hypertension: Secondary | ICD-10-CM

## 2019-06-27 DIAGNOSIS — E785 Hyperlipidemia, unspecified: Secondary | ICD-10-CM

## 2019-06-27 DIAGNOSIS — Z794 Long term (current) use of insulin: Secondary | ICD-10-CM | POA: Diagnosis not present

## 2019-06-27 LAB — POCT GLYCOSYLATED HEMOGLOBIN (HGB A1C): Hemoglobin A1C: 6.1 % — AB (ref 4.0–5.6)

## 2019-06-27 LAB — COMPLETE METABOLIC PANEL WITH GFR
AG Ratio: 1.5 (calc) (ref 1.0–2.5)
ALT: 13 U/L (ref 9–46)
AST: 19 U/L (ref 10–35)
Albumin: 4.6 g/dL (ref 3.6–5.1)
Alkaline phosphatase (APISO): 59 U/L (ref 35–144)
BUN: 15 mg/dL (ref 7–25)
CO2: 29 mmol/L (ref 20–32)
Calcium: 10 mg/dL (ref 8.6–10.3)
Chloride: 102 mmol/L (ref 98–110)
Creat: 0.74 mg/dL (ref 0.70–1.25)
GFR, Est African American: 112 mL/min/{1.73_m2} (ref >=60)
GFR, Est Non African American: 97 mL/min/{1.73_m2} (ref >=60)
Globulin: 3 g/dL (calc) (ref 1.9–3.7)
Glucose, Bld: 90 mg/dL (ref 65–99)
Potassium: 4.4 mmol/L (ref 3.5–5.3)
Sodium: 140 mmol/L (ref 135–146)
Total Bilirubin: 0.5 mg/dL (ref 0.2–1.2)
Total Protein: 7.6 g/dL (ref 6.1–8.1)

## 2019-06-27 LAB — LIPID PANEL
Cholesterol: 157 mg/dL (ref <200)
HDL: 32 mg/dL — ABNORMAL LOW (ref >=40)
LDL Cholesterol (Calc): 108 mg/dL (calc) — ABNORMAL HIGH
Non-HDL Cholesterol (Calc): 125 mg/dL (calc) (ref <130)
Total CHOL/HDL Ratio: 4.9 (calc) (ref <5.0)
Triglycerides: 81 mg/dL (ref <150)

## 2019-06-27 MED ORDER — MUCINEX 600 MG PO TB12: 600.0000 mg | ORAL_TABLET | Freq: Two times a day (BID) | ORAL | 1 refills | Status: DC | PRN

## 2019-06-27 MED ORDER — PRAVASTATIN SODIUM 20 MG PO TABS: ORAL_TABLET | ORAL | 3 refills | Status: DC

## 2019-06-27 MED ORDER — METFORMIN HCL 1000 MG PO TABS: 1000.0000 mg | ORAL_TABLET | Freq: Two times a day (BID) | ORAL | 3 refills | Status: DC

## 2019-06-27 MED ORDER — HYDROCODONE-ACETAMINOPHEN 5-325 MG PO TABS: 1.0000 | ORAL_TABLET | Freq: Four times a day (QID) | ORAL | 0 refills | Status: DC | PRN

## 2019-06-27 MED ORDER — TRAZODONE HCL 100 MG PO TABS: 100.0000 mg | ORAL_TABLET | Freq: Every day | ORAL | 0 refills | Status: DC

## 2019-06-27 NOTE — Assessment & Plan Note (Signed)
We discussed trying to just use 1 sedative for sleep.  So he was willing to try decreasing the trazodone so we will decrease down to 100 mg and continue with the Ambien.  If this works well then when I see him back we will continue to work on tapering the trazodone down over time.

## 2019-06-27 NOTE — Assessment & Plan Note (Signed)
Like something specifically for cough he continues to have a cough and feels like things something is stuck in his chest but is not getting a lot of productive sputum.  We had treated him with azithromycin back in December.  We will try Mucinex for cough.  If not improving then please let us know.  He did actually have a chest x-ray in December about a week after I saw him after he fell and hit his ribs.  Did not show any other underlying lung disease but he did have some scarring and atelectasis.  Which likely explains his restrictive lung disease.  Consider spirometry for further work-up.

## 2019-06-27 NOTE — Patient Instructions (Signed)
Okay to decrease insulin dose to 34 units. Also decreased her trazodone to 100 mg daily.  We will work on trying to maybe slowly taper that one off over time.

## 2019-06-27 NOTE — Progress Notes (Signed)
Established Patient Office Visit  Subjective:  Patient ID: Justin Goodman, male    DOB: 03/20/1956  Age: 64 y.o. MRN: [id number]  CC:  Chief Complaint  Patient presents with  . Diabetes  . Hypertension    HPI Justin Goodman presents for   Hypertension- Pt denies chest pain, SOB, dizziness, or heart palpitations.  Taking meds as directed w/o problems.  Denies medication side effects.  Using 38 units of Toujeo.   He has been having some low back pain.  He would like a refill on hydrocodone that was given in December.  He says he just used it sparingly mostly at bedtime as needed but it did seem to help.  The pain is radiating down into both legs.  Seems to be very bothersome at night and has been going on for months at this point.  Follow-up insomnia-he is currently taking Ambien 10 mg and 150 mg of trazodone.  He says it seems to be working well.  Diabetes - no hypoglycemic events. No wounds or sores that are not healing well. No increased thirst or urination. Checking glucose at home. Taking medications as prescribed without any side effects.  Currently taking 38 units of Toujeo.   Past Medical History:  Diagnosis Date  . Chronic back pain    only mild loss of T12-L1 disk ht on xray 1-12  . COPD (chronic obstructive pulmonary disease) ([place])   . Diabetes mellitus    w/ neuropathy  . Hypertension   . Smoker     Past Surgical History:  Procedure Laterality Date  . LUMBAR LAMINECTOMY/DECOMPRESSION MICRODISCECTOMY N/A 09/25/2017   Procedure: LAMINECTOMY LUMBAR FOUR- LUMBAR FIVE;  Surgeon: Ashok Pall, MD;  Location: [facility];  Service: Neurosurgery;  Laterality: N/A;  . UVULOPALATOPHARYNGOPLASTY     2001    Family History  Problem Relation Age of Onset  . Diabetes Mother   . Alcohol abuse Father        Liver disease killed father @ age 74.  Marland Kitchen Alcohol abuse Brother   . Heart attack Brother        Died at age 35.    Social History   Socioeconomic History  . Marital  status: Single    Spouse name: Not on file  . Number of children: Not on file  . Years of education: Not on file  . Highest education level: Not on file  Occupational History  . Not on file  Tobacco Use  . Smoking status: Former Smoker    Packs/day: 1.00    Years: 20.00    Pack years: 20.00    Types: Cigarettes    Quit date: 2013    Years since quitting: 8.2  . Smokeless tobacco: Never Used  Substance and Sexual Activity  . Alcohol use: No  . Drug use: No  . Sexual activity: Yes  Other Topics Concern  . Not on file  Social History Narrative  . Not on file   Social Determinants of Health   Financial Resource Strain:   . Difficulty of Paying Living Expenses:   Food Insecurity:   . Worried About Charity fundraiser in the Last Year:   . Arboriculturist in the Last Year:   Transportation Needs:   . Film/video editor (Medical):   Marland Kitchen Lack of Transportation (Non-Medical):   Physical Activity:   . Days of Exercise per Week:   . Minutes of Exercise per Session:   Stress:   . Feeling of  Stress :   Social Connections:   . Frequency of Communication with Friends and Family:   . Frequency of Social Gatherings with Friends and Family:   . Attends Religious Services:   . Active Member of Clubs or Organizations:   . Attends Archivist Meetings:   Marland Kitchen Marital Status:   Intimate Partner Violence:   . Fear of Current or Ex-Partner:   . Emotionally Abused:   Marland Kitchen Physically Abused:   . Sexually Abused:     Outpatient Medications Prior to Visit  Medication Sig Dispense Refill  . Alcohol Swabs (B-D SINGLE USE SWABS REGULAR) PADS Check blood glucose 4 times daily Diagnosis diabetes E11.40 600 each 3  . amitriptyline (ELAVIL) 50 MG tablet TAKE 1 TABLET BY MOUTH EVERYDAY AT BEDTIME 90 tablet 1  . blood glucose meter kit and supplies Dispense based on patient and insurance preference. Use up to four times daily as directed. Dx: E11.9 1 each 0  . gabapentin (NEURONTIN) 600 MG  tablet TAKE 2 TABLETS BY MOUTH 3 TIMES A DAY 540 tablet 0  . Insulin Pen Needle (B-D ULTRAFINE III SHORT PEN) 31G X 8 MM MISC USE FOR DAILY INSULIN INJECTION AS DIRECTED BY PHYSICIAN. DX E11.40 100 each 4  . losartan (COZAAR) 50 MG tablet TAKE 1 TABLET BY MOUTH EVERY DAY 90 tablet 1  . meloxicam (MOBIC) 7.5 MG tablet Take 7.5 mg by mouth daily.     . metoprolol tartrate (LOPRESSOR) 50 MG tablet TAKE 1/2 TABLET BY MOUTH TWICE A DAY 90 tablet 1  . omeprazole (PRILOSEC) 40 MG capsule Take 1 capsule (40 mg total) by mouth daily. 90 capsule 4  . tiZANidine (ZANAFLEX) 4 MG tablet TAKE 1 TABLET BY MOUTH TWICE A DAY AS NEEDED FOR MUSCLE SPASM 180 tablet 0  . TOUJEO SOLOSTAR 300 UNIT/ML SOPN INJECT 48 UNITS SUBCUTANEOUSLY ONCE DAILY (Patient taking differently: 38 Units. ) 6 pen 3  . metFORMIN (GLUCOPHAGE) 1000 MG tablet Take 1 tablet (1,000 mg total) by mouth 2 (two) times daily with a meal. 180 tablet 3  . pravastatin (PRAVACHOL) 20 MG tablet 1 tab po twice per week at bedtime. 24 tablet 3  . zolpidem (AMBIEN) 10 MG tablet Take 1 tablet (10 mg total) by mouth at bedtime as needed for sleep. 90 tablet 1  . HYDROcodone-acetaminophen (NORCO/VICODIN) 5-325 MG tablet Take 1-2 tablets by mouth every 6 (six) hours as needed for moderate pain. 30 tablet 0  . methocarbamol (ROBAXIN) 500 MG tablet Take by mouth.    . traZODone (DESYREL) 150 MG tablet TAKE 1 TABLET BY MOUTH EVERYDAY AT BEDTIME 90 tablet 2   No facility-administered medications prior to visit.    Allergies  Allergen Reactions  . Amaryl [Glimepiride] Other (See Comments)    Frequent hypoglycemia  . Atorvastatin Other (See Comments)    HA   . Cymbalta [Duloxetine Hcl] Other (See Comments)    Dizziness, insomnia  . Glipizide Other (See Comments)    HA  . Losartan Other (See Comments)    Vision changes, HA.   Marland Kitchen Lyrica [Pregabalin] Other (See Comments)    Sedation  . Quinapril Cough  . Simvastatin Other (See Comments)    Severe myalgias.       ROS Review of Systems    Objective:    Physical Exam  Constitutional: He is oriented to person, place, and time. He appears well-developed and well-nourished.  HENT:  Head: Normocephalic and atraumatic.  Cardiovascular: Normal rate, regular rhythm and normal  heart sounds.  Pulmonary/Chest: Effort normal and breath sounds normal.  Neurological: He is alert and oriented to person, place, and time.  Skin: Skin is warm and dry.  Psychiatric: He has a normal mood and affect. His behavior is normal.    BP 133/66   Pulse 89   Ht _0  (1.676 m)   Wt 168 lb (76.2 kg)   SpO2 100%   BMI 27.12 kg/m  Wt Readings from Last 3 Encounters:  06/27/19 168 lb (76.2 kg)  03/29/19 167 lb (75.8 kg)  12/22/18 171 lb (77.6 kg)     Health Maintenance Due  Topic Date Due  . COLON CANCER SCREENING ANNUAL FOBT  08/13/2017  . OPHTHALMOLOGY EXAM  08/12/2018  . PNA vac Low Risk Adult (1 of 2 - PCV13) 08/29/2018    There are no preventive care reminders to display for this patient.  Lab Results  Component Value Date   TSH 2.11 08/12/2016   Lab Results  Component Value Date   WBC 3.0 (L) 01/06/2018   HGB 13.9 01/06/2018   HCT 42.0 01/06/2018   MCV 83.3 01/06/2018   PLT 170 01/06/2018   Lab Results  Component Value Date   NA 139 12/22/2018   K 4.2 12/22/2018   CO2 28 12/22/2018   GLUCOSE 133 (H) 12/22/2018   BUN 13 12/22/2018   CREATININE 0.78 12/22/2018   BILITOT 0.4 06/22/2018   ALKPHOS 51 08/12/2016   AST 16 06/22/2018   ALT 13 06/22/2018   PROT 7.3 06/22/2018   ALBUMIN 4.5 08/12/2016   CALCIUM 9.8 12/22/2018   ANIONGAP 5 09/18/2017   Lab Results  Component Value Date   CHOL 196 06/22/2018   Lab Results  Component Value Date   HDL 36 (L) 06/22/2018   Lab Results  Component Value Date   LDLCALC 129 (H) 06/22/2018   Lab Results  Component Value Date   TRIG 178 (H) 06/22/2018   Lab Results  Component Value Date   CHOLHDL 5.4 (H) 06/22/2018   Lab Results   Component Value Date   HGBA1C 6.1 (A) 06/27/2019      Assessment & Plan:   Problem List Items Addressed This Visit      Cardiovascular and Mediastinum   ESSENTIAL HYPERTENSION, BENIGN    Well controlled. Continue current regimen. Follow up in  4 months.       Relevant Medications   pravastatin (PRAVACHOL) 20 MG tablet   Other Relevant Orders   POCT glycosylated hemoglobin (Hb A1C) (Completed)   Lipid panel   COMPLETE METABOLIC PANEL WITH GFR     Respiratory   Asthma with COPD (chronic obstructive pulmonary disease) ([place])    Like something specifically for cough he continues to have a cough and feels like things something is stuck in his chest but is not getting a lot of productive sputum.  We had treated him with azithromycin back in December.  We will try Mucinex for cough.  If not improving then please let us know.  He did actually have a chest x-ray in December about a week after I saw him after he fell and hit his ribs.  Did not show any other underlying lung disease but he did have some scarring and atelectasis.  Which likely explains his restrictive lung disease.  Consider spirometry for further work-up.      Relevant Medications   guaiFENesin (MUCINEX) 600 MG 12 hr tablet     Endocrine   Well controlled type 2 diabetes  mellitus with peripheral neuropathy (HCC) - Primary    A1c looks phenomenal today at 6.1 which is down from previous of 6.8.  We will go ahead and decrease Toujeo down to 34 units since his A1c looks so great.  Hopefully we can continue to taper.  He says ultimately his goal is to get off of insulin shots.      Relevant Medications   pravastatin (PRAVACHOL) 20 MG tablet   metFORMIN (GLUCOPHAGE) 1000 MG tablet   traZODone (DESYREL) 100 MG tablet   Other Relevant Orders   POCT glycosylated hemoglobin (Hb A1C) (Completed)   Lipid panel   COMPLETE METABOLIC PANEL WITH GFR   Diabetic peripheral neuropathy ([place])    Requesting a refill on his gabapentin but  it looks like he is actually not due for refill until May.      Relevant Medications   pravastatin (PRAVACHOL) 20 MG tablet   metFORMIN (GLUCOPHAGE) 1000 MG tablet   traZODone (DESYREL) 100 MG tablet     Other   Lumbar stenosis with neurogenic claudication    Did go ahead and refill his hydrocodone today just for as needed use.  We may need to consider putting him on a pain contract if he requests another refill when I see him back.  He has already seen the spine surgeon previously and actually had lumbar disc surgery.      Relevant Medications   traZODone (DESYREL) 100 MG tablet   HYDROcodone-acetaminophen (NORCO/VICODIN) 5-325 MG tablet   Insomnia    We discussed trying to just use 1 sedative for sleep.  So he was willing to try decreasing the trazodone so we will decrease down to 100 mg and continue with the Ambien.  If this works well then when I see him back we will continue to work on tapering the trazodone down over time.      Relevant Medications   traZODone (DESYREL) 100 MG tablet   Hyperlipidemia   Relevant Medications   pravastatin (PRAVACHOL) 20 MG tablet   Other Relevant Orders   POCT glycosylated hemoglobin (Hb A1C) (Completed)   Lipid panel   COMPLETE METABOLIC PANEL WITH GFR        Meds ordered this encounter  Medications  . pravastatin (PRAVACHOL) 20 MG tablet    Sig: 1 tab po twice per week at bedtime.    Dispense:  24 tablet    Refill:  3  . metFORMIN (GLUCOPHAGE) 1000 MG tablet    Sig: Take 1 tablet (1,000 mg total) by mouth 2 (two) times daily with a meal.    Dispense:  180 tablet    Refill:  3  . traZODone (DESYREL) 100 MG tablet    Sig: Take 1 tablet (100 mg total) by mouth at bedtime.    Dispense:  90 tablet    Refill:  0    Please cancel any rx for the 185m dose.  .Marland KitchenHYDROcodone-acetaminophen (NORCO/VICODIN) 5-325 MG tablet    Sig: Take 1-2 tablets by mouth every 6 (six) hours as needed for moderate pain.    Dispense:  30 tablet    Refill:  0   . guaiFENesin (MUCINEX) 600 MG 12 hr tablet    Sig: Take 1 tablet (600 mg total) by mouth 2 (two) times daily as needed for to loosen phlegm.    Dispense:  60 tablet    Refill:  1    Follow-up: Return in about 3 months (around 09/27/2019) for Diabetes follow-up.    CBarnetta Chapel  Madilyn Fireman, MD

## 2019-06-27 NOTE — Assessment & Plan Note (Signed)
Well controlled. Continue current regimen. Follow up in  4 months.   

## 2019-06-27 NOTE — Assessment & Plan Note (Signed)
Did go ahead and refill his hydrocodone today just for as needed use.  We may need to consider putting him on a pain contract if he requests another refill when I see him back.  He has already seen the spine surgeon previously and actually had lumbar disc surgery.

## 2019-06-27 NOTE — Assessment & Plan Note (Signed)
Requesting a refill on his gabapentin but it looks like he is actually not due for refill until May.

## 2019-06-27 NOTE — Assessment & Plan Note (Addendum)
A1c looks phenomenal today at 6.1 which is down from previous of 6.8.  We will go ahead and decrease Toujeo down to 34 units since his A1c looks so great.  Hopefully we can continue to taper.  He says ultimately his goal is to get off of insulin shots.

## 2019-07-05 ENCOUNTER — Other Ambulatory Visit: Payer: Self-pay | Admitting: Family Medicine

## 2019-07-12 ENCOUNTER — Other Ambulatory Visit: Payer: Self-pay | Admitting: Family Medicine

## 2019-07-12 NOTE — Telephone Encounter (Signed)
[facility] requesting med refill for zolpidem.  

## 2019-07-29 ENCOUNTER — Telehealth: Payer: Self-pay

## 2019-07-29 NOTE — Telephone Encounter (Signed)
I called patient and advised him of recommendations. I tried to schedule him for a virtual visit, he said "never mind" and hung up the phone.

## 2019-07-29 NOTE — Telephone Encounter (Signed)
Needs virtual if possible

## 2019-07-29 NOTE — Telephone Encounter (Signed)
Justin Goodman states he has another COPD flare up. Coughing, wheezing and "hard to breath". He is requesting an antibiotic. I did advise he may need a virtual visit. Please advise.

## 2019-08-01 NOTE — Telephone Encounter (Signed)
Called Justin Goodman to check on his wellbeing. He stated that he was doing ok. The only problem he notes is with sleep. He stopped taking Zolpidem because he feels drowsy the next day.

## 2019-08-25 ENCOUNTER — Other Ambulatory Visit: Payer: Self-pay | Admitting: Family Medicine

## 2019-08-26 ENCOUNTER — Other Ambulatory Visit: Payer: Self-pay | Admitting: Family Medicine

## 2019-09-01 ENCOUNTER — Other Ambulatory Visit: Payer: Self-pay | Admitting: Family Medicine

## 2019-09-01 NOTE — Telephone Encounter (Signed)
Justin Goodman called in to get his medication refilled. He denied an office with [name]. His next appointment is on 09/26/19.  Please advise.    tiZANidine (ZANAFLEX) 4 MG tablet [[id number]]

## 2019-09-06 ENCOUNTER — Ambulatory Visit: Payer: Medicare HMO | Admitting: Family Medicine

## 2019-09-18 DIAGNOSIS — R531 Weakness: Secondary | ICD-10-CM | POA: Diagnosis not present

## 2019-09-18 DIAGNOSIS — R600 Localized edema: Secondary | ICD-10-CM | POA: Diagnosis not present

## 2019-09-18 DIAGNOSIS — E11649 Type 2 diabetes mellitus with hypoglycemia without coma: Secondary | ICD-10-CM | POA: Diagnosis not present

## 2019-09-18 DIAGNOSIS — E1140 Type 2 diabetes mellitus with diabetic neuropathy, unspecified: Secondary | ICD-10-CM | POA: Diagnosis not present

## 2019-09-18 DIAGNOSIS — I10 Essential (primary) hypertension: Secondary | ICD-10-CM | POA: Diagnosis not present

## 2019-09-18 DIAGNOSIS — R42 Dizziness and giddiness: Secondary | ICD-10-CM | POA: Diagnosis not present

## 2019-09-18 DIAGNOSIS — S92515A Nondisplaced fracture of proximal phalanx of left lesser toe(s), initial encounter for closed fracture: Secondary | ICD-10-CM | POA: Diagnosis not present

## 2019-09-18 DIAGNOSIS — M6282 Rhabdomyolysis: Secondary | ICD-10-CM | POA: Diagnosis not present

## 2019-09-18 DIAGNOSIS — S80211A Abrasion, right knee, initial encounter: Secondary | ICD-10-CM | POA: Diagnosis not present

## 2019-09-18 DIAGNOSIS — Z043 Encounter for examination and observation following other accident: Secondary | ICD-10-CM | POA: Diagnosis not present

## 2019-09-18 DIAGNOSIS — T796XXA Traumatic ischemia of muscle, initial encounter: Secondary | ICD-10-CM | POA: Diagnosis not present

## 2019-09-18 DIAGNOSIS — S9031XA Contusion of right foot, initial encounter: Secondary | ICD-10-CM | POA: Diagnosis not present

## 2019-09-18 DIAGNOSIS — S80212A Abrasion, left knee, initial encounter: Secondary | ICD-10-CM | POA: Diagnosis not present

## 2019-09-19 ENCOUNTER — Telehealth: Payer: Self-pay | Admitting: Family Medicine

## 2019-09-19 NOTE — Telephone Encounter (Signed)
Please call pt: I saw where he was in the ED with low blood sugars. Last I saw him we decreased his Toujeo to 34 units. If he is having lows please decrease Toujeo to 20 units daily. If he is taking a different dose then please let me know.  I think has appt next week with me.

## 2019-09-21 NOTE — Telephone Encounter (Signed)
Patient advised of recommendations.  

## 2019-09-24 ENCOUNTER — Other Ambulatory Visit: Payer: Self-pay | Admitting: Family Medicine

## 2019-09-24 DIAGNOSIS — F5101 Primary insomnia: Secondary | ICD-10-CM

## 2019-09-26 ENCOUNTER — Other Ambulatory Visit: Payer: Self-pay

## 2019-09-26 ENCOUNTER — Ambulatory Visit: Payer: Medicare HMO | Admitting: Family Medicine

## 2019-09-26 ENCOUNTER — Encounter: Payer: Self-pay | Admitting: Family Medicine

## 2019-09-26 ENCOUNTER — Ambulatory Visit (INDEPENDENT_AMBULATORY_CARE_PROVIDER_SITE_OTHER): Payer: Medicare HMO | Admitting: Family Medicine

## 2019-09-26 VITALS — BP 125/67 | HR 85 | Ht 66.0 in | Wt 159.0 lb

## 2019-09-26 DIAGNOSIS — W19XXXA Unspecified fall, initial encounter: Secondary | ICD-10-CM | POA: Diagnosis not present

## 2019-09-26 DIAGNOSIS — I10 Essential (primary) hypertension: Secondary | ICD-10-CM

## 2019-09-26 DIAGNOSIS — R748 Abnormal levels of other serum enzymes: Secondary | ICD-10-CM | POA: Diagnosis not present

## 2019-09-26 DIAGNOSIS — Y92009 Unspecified place in unspecified non-institutional (private) residence as the place of occurrence of the external cause: Secondary | ICD-10-CM

## 2019-09-26 DIAGNOSIS — S81819A Laceration without foreign body, unspecified lower leg, initial encounter: Secondary | ICD-10-CM | POA: Diagnosis not present

## 2019-09-26 DIAGNOSIS — M48062 Spinal stenosis, lumbar region with neurogenic claudication: Secondary | ICD-10-CM

## 2019-09-26 DIAGNOSIS — E1142 Type 2 diabetes mellitus with diabetic polyneuropathy: Secondary | ICD-10-CM | POA: Diagnosis not present

## 2019-09-26 LAB — POCT GLYCOSYLATED HEMOGLOBIN (HGB A1C): Hemoglobin A1C: 5.9 % — AB (ref 4.0–5.6)

## 2019-09-26 MED ORDER — GABAPENTIN 600 MG PO TABS: 1200.0000 mg | ORAL_TABLET | Freq: Three times a day (TID) | ORAL | 0 refills | Status: DC

## 2019-09-26 NOTE — Assessment & Plan Note (Signed)
A1c looks phenomenal today but I am very concerned that he has been having a lot of hypoglycemic episodes and he still had a blood sugar less than 80 even on the reduction to 20 units daily.  We discussed holding the insulin completely for a week and seeing what his blood sugars do we can always restarted at a lower dose such as 10 or 12 units if need be.  Reminded him that he cannot go up on the Metformin so do not double.

## 2019-09-26 NOTE — Patient Instructions (Signed)
Ok to hold insulin completely.  Let me know what your blood sugars are doing after 1 to 2 weeks.

## 2019-09-26 NOTE — Progress Notes (Signed)
Established Patient Office Visit  Subjective:  Patient ID: Justin Goodman, male    DOB: 03/14/1956  Age: 64 y.o. MRN: [id number]  CC:  Chief Complaint  Patient presents with   Diabetes    HPI Justin Goodman presents for   Hypertension- Pt denies chest pain, SOB, dizziness, or heart palpitations.  Taking meds as directed w/o problems.  Denies medication side effects.    Diabetes - no hypoglycemic events. No wounds or sores that are not healing well. No increased thirst or urination. Checking glucose at home. Taking medications as prescribed without any side effects.  He actually went to the emergency department on 613 for hypoglycemia.  When I last saw him we actually decrease his Toujeo to 34 units.  We called him and encouraged him to decrease his Toujeo to 20 units which he has been doing for the last several days.  On the 20 units he is still having blood sugars ranging between 78 in the 120s.  He can come off of his insulin completely.  F/U Lumbar Stenosis -taking his gabapentin regularly.  He was also seen in the emergency department recently for multiple falls because of hypoglycemia.  He actually fractured his second toe on his left foot.  He has not been wearing the postop shoe because he felt like it was Justin Goodman clunky.  Justin Goodman he is actually wearing bedroom slippers today but he still having a lot of pain and swelling over the distal toe area over the distal metatarsal heads.  Also has significant swelling and bruising over the second toe.  Past Medical History:  Diagnosis Date   Chronic back pain    only mild loss of T12-L1 disk ht on xray 1-12   COPD (chronic obstructive pulmonary disease) (HCC)    Diabetes mellitus    w/ neuropathy   Hypertension    Smoker     Past Surgical History:  Procedure Laterality Date   LUMBAR LAMINECTOMY/DECOMPRESSION MICRODISCECTOMY N/A 09/25/2017   Procedure: LAMINECTOMY LUMBAR FOUR- LUMBAR FIVE;  Surgeon: Ashok Pall, MD;   Location: [place];  Service: Neurosurgery;  Laterality: N/A;   UVULOPALATOPHARYNGOPLASTY     2001    Family History  Problem Relation Age of Onset   Diabetes Mother    Alcohol abuse Father        Liver disease killed father @ age 46.   Alcohol abuse Brother    Heart attack Brother        Died at age 37.    Social History   Socioeconomic History   Marital status: Single    Spouse name: Not on file   Number of children: Not on file   Years of education: Not on file   Highest education level: Not on file  Occupational History   Not on file  Tobacco Use   Smoking status: Former Smoker    Packs/day: 1.00    Years: 20.00    Pack years: 20.00    Types: Cigarettes    Quit date: 2013    Years since quitting: 8.4   Smokeless tobacco: Never Used  Scientific laboratory technician Use: Never used  Substance and Sexual Activity   Alcohol use: No   Drug use: No   Sexual activity: Yes  Other Topics Concern   Not on file  Social History Narrative   Not on file   Social Determinants of Health   Financial Resource Strain:    Difficulty of Paying Living Expenses:   Food Insecurity:  Worried About Charity fundraiser in the Last Year:    Arboriculturist in the Last Year:   Transportation Needs:    Film/video editor (Medical):    Lack of Transportation (Non-Medical):   Physical Activity:    Days of Exercise per Week:    Minutes of Exercise per Session:   Stress:    Feeling of Stress :   Social Connections:    Frequency of Communication with Friends and Family:    Frequency of Social Gatherings with Friends and Family:    Attends Religious Services:    Active Member of Clubs or Organizations:    Attends Music therapist:    Marital Status:   Intimate Partner Violence:    Fear of Current or Ex-Partner:    Emotionally Abused:    Physically Abused:    Sexually Abused:     Outpatient Medications Prior to Visit  Medication Sig  Dispense Refill   ACCU-CHEK GUIDE test strip USE TO CHECK BLOOD SUGAR UP TO 4 TIMES A DAY AS DIRECTED 300 strip 6   Alcohol Swabs (B-D SINGLE USE SWABS REGULAR) PADS Check blood glucose 4 times daily Diagnosis diabetes E11.40 600 each 3   amitriptyline (ELAVIL) 50 MG tablet TAKE 1 TABLET BY MOUTH EVERYDAY AT BEDTIME 90 tablet 1   blood glucose meter kit and supplies Dispense based on patient and insurance preference. Use up to four times daily as directed. Dx: E11.9 1 each 0   Insulin Pen Needle (B-D ULTRAFINE III SHORT PEN) 31G X 8 MM MISC USE FOR DAILY INSULIN INJECTION AS DIRECTED BY PHYSICIAN. DX E11.40 100 each 4   losartan (COZAAR) 50 MG tablet TAKE 1 TABLET BY MOUTH EVERY DAY 90 tablet 1   meloxicam (MOBIC) 7.5 MG tablet Take 7.5 mg by mouth daily.      metFORMIN (GLUCOPHAGE) 1000 MG tablet Take 1 tablet (1,000 mg total) by mouth 2 (two) times daily with a meal. 180 tablet 3   metoprolol tartrate (LOPRESSOR) 50 MG tablet TAKE 1/2 TABLET BY MOUTH TWICE A DAY 90 tablet 1   omeprazole (PRILOSEC) 40 MG capsule TAKE 1 CAPSULE BY MOUTH EVERY DAY 90 capsule 4   pravastatin (PRAVACHOL) 20 MG tablet 1 tab po twice per week at bedtime. 24 tablet 3   tiZANidine (ZANAFLEX) 4 MG tablet TAKE 1 TABLET BY MOUTH TWICE A DAY AS NEEDED FOR MUSCLE SPASM 180 tablet 0   zolpidem (AMBIEN) 10 MG tablet TAKE 1 TABLET (10 MG TOTAL) BY MOUTH AT BEDTIME AS NEEDED FOR SLEEP. 90 tablet 0   cephALEXin (KEFLEX) 500 MG capsule      gabapentin (NEURONTIN) 600 MG tablet TAKE 2 TABLETS BY MOUTH 3 TIMES A DAY 540 tablet 0   TOUJEO SOLOSTAR 300 UNIT/ML SOPN INJECT 48 UNITS SUBCUTANEOUSLY ONCE DAILY (Patient taking differently: 20 Units. ) 6 pen 3   traZODone (DESYREL) 100 MG tablet Take 1 tablet (100 mg total) by mouth at bedtime. 90 tablet 0   guaiFENesin (MUCINEX) 600 MG 12 hr tablet Take 1 tablet (600 mg total) by mouth 2 (two) times daily as needed for to loosen phlegm. 60 tablet 1    HYDROcodone-acetaminophen (NORCO/VICODIN) 5-325 MG tablet Take 1-2 tablets by mouth every 6 (six) hours as needed for moderate pain. 30 tablet 0   No facility-administered medications prior to visit.    Allergies  Allergen Reactions   Amaryl [Glimepiride] Other (See Comments)    Frequent hypoglycemia   Atorvastatin Other (  See Comments)    HA    Cymbalta [Duloxetine Hcl] Other (See Comments)    Dizziness, insomnia   Glipizide Other (See Comments)    HA   Losartan Other (See Comments)    Vision changes, HA.    Lyrica [Pregabalin] Other (See Comments)    Sedation   Quinapril Cough   Simvastatin Other (See Comments)    Severe myalgias.      ROS Review of Systems    Objective:    Physical Exam Constitutional:      Appearance: He is well-developed.  HENT:     Head: Normocephalic and atraumatic.  Cardiovascular:     Rate and Rhythm: Normal rate and regular rhythm.     Heart sounds: Normal heart sounds.  Pulmonary:     Effort: Pulmonary effort is normal.     Breath sounds: Normal breath sounds.  Skin:    General: Skin is warm and dry.     Comments: Also has significant swelling and bruising over the second toe.   Neurological:     Mental Status: He is alert and oriented to person, place, and time.  Psychiatric:        Behavior: Behavior normal.     BP 125/67    Pulse 85    Ht _0  (1.676 m)    Wt 159 lb (72.1 kg)    SpO2 99%    BMI 25.66 kg/m  Wt Readings from Last 3 Encounters:  09/26/19 159 lb (72.1 kg)  06/27/19 168 lb (76.2 kg)  03/29/19 167 lb (75.8 kg)     Health Maintenance Due  Topic Date Due   COLON CANCER SCREENING ANNUAL FOBT  08/13/2017   OPHTHALMOLOGY EXAM  08/12/2018   PNA vac Low Risk Adult (1 of 2 - PCV13) 08/29/2018    There are no preventive care reminders to display for this patient.  Lab Results  Component Value Date   TSH 2.11 08/12/2016   Lab Results  Component Value Date   WBC 3.0 (L) 01/06/2018   HGB 13.9  01/06/2018   HCT 42.0 01/06/2018   MCV 83.3 01/06/2018   PLT 170 01/06/2018   Lab Results  Component Value Date   NA 140 06/27/2019   K 4.4 06/27/2019   CO2 29 06/27/2019   GLUCOSE 90 06/27/2019   BUN 15 06/27/2019   CREATININE 0.74 06/27/2019   BILITOT 0.5 06/27/2019   ALKPHOS 51 08/12/2016   AST 19 06/27/2019   ALT 13 06/27/2019   PROT 7.6 06/27/2019   ALBUMIN 4.5 08/12/2016   CALCIUM 10.0 06/27/2019   ANIONGAP 5 09/18/2017   Lab Results  Component Value Date   CHOL 157 06/27/2019   Lab Results  Component Value Date   HDL 32 (L) 06/27/2019   Lab Results  Component Value Date   LDLCALC 108 (H) 06/27/2019   Lab Results  Component Value Date   TRIG 81 06/27/2019   Lab Results  Component Value Date   CHOLHDL 4.9 06/27/2019   Lab Results  Component Value Date   HGBA1C 5.9 (A) 09/26/2019      Assessment & Plan:   Problem List Items Addressed This Visit      Cardiovascular and Mediastinum   ESSENTIAL HYPERTENSION, BENIGN    Well controlled. Continue current regimen. Follow up in  3 mo       Relevant Orders   CBC   COMPLETE METABOLIC PANEL WITH GFR   CK (Creatine Kinase)     Endocrine  Well controlled type 2 diabetes mellitus with peripheral neuropathy ([place]) - Primary    A1c looks phenomenal today but I am very concerned that he has been having a lot of hypoglycemic episodes and he still had a blood sugar less than 80 even on the reduction to 20 units daily.  We discussed holding the insulin completely for a week and seeing what his blood sugars do we can always restarted at a lower dose such as 10 or 12 units if need be.  Reminded him that he cannot go up on the Metformin so do not double.      Relevant Medications   gabapentin (NEURONTIN) 600 MG tablet   Other Relevant Orders   POCT glycosylated hemoglobin (Hb A1C) (Completed)   CBC   COMPLETE METABOLIC PANEL WITH GFR   CK (Creatine Kinase)     Other   Lumbar stenosis with neurogenic  claudication    Tinea with gabapentin.  He is on a high dose and I do worry with that combination of the Ambien and the trazodone that it could be causing some increased risk for fall as well again we will just correct the hypoglycemia and see if this improves.      Relevant Medications   gabapentin (NEURONTIN) 600 MG tablet   Fall at home, initial encounter    Other Visit Diagnoses    Laceration of multiple sites of lower extremity, unspecified laterality, initial encounter       Elevated CK       Relevant Orders   CBC   COMPLETE METABOLIC PANEL WITH GFR   CK (Creatine Kinase)      Lacerations to legs - make sure complete keflex.  No open wounds all of them are pretty much scabbed over.  Unfortunately secondary to hypoglycemia causing increased falls.  Hopefully backing off of his insulin will make a big difference.  Is like he is really made some major dietary changes he is mostly just eating chicken and vegetables and that is it.  He was also diagnosed with some rhabdomyolysis in the emergency department so we will plan to recheck CK today.  Fall at home-again secondary to hypoglycemia so hopefully were getting that corrected.  Meds ordered this encounter  Medications   gabapentin (NEURONTIN) 600 MG tablet    Sig: Take 2 tablets (1,200 mg total) by mouth 3 (three) times daily.    Dispense:  540 tablet    Refill:  0    Follow-up: Return in about 3 months (around 12/27/2019) for Diabetes follow-up.    Beatrice Lecher, MD

## 2019-09-26 NOTE — Assessment & Plan Note (Signed)
Tinea with gabapentin.  He is on a high dose and I do worry with that combination of the Ambien and the trazodone that it could be causing some increased risk for fall as well again we will just correct the hypoglycemia and see if this improves.

## 2019-09-26 NOTE — Assessment & Plan Note (Signed)
Well controlled. Continue current regimen. Follow up in  3 mo .  

## 2019-09-27 DIAGNOSIS — E1142 Type 2 diabetes mellitus with diabetic polyneuropathy: Secondary | ICD-10-CM | POA: Diagnosis not present

## 2019-09-27 DIAGNOSIS — R748 Abnormal levels of other serum enzymes: Secondary | ICD-10-CM | POA: Diagnosis not present

## 2019-09-27 DIAGNOSIS — I10 Essential (primary) hypertension: Secondary | ICD-10-CM | POA: Diagnosis not present

## 2019-09-27 LAB — CBC
HCT: 38.7 % (ref 38.5–50.0)
Hemoglobin: 13.1 g/dL — ABNORMAL LOW (ref 13.2–17.1)
MCH: 29.4 pg (ref 27.0–33.0)
MCHC: 33.9 g/dL (ref 32.0–36.0)
MCV: 87 fL (ref 80.0–100.0)
MPV: 8.9 fL (ref 7.5–12.5)
Platelets: 193 10*3/uL (ref 140–400)
RBC: 4.45 10*6/uL (ref 4.20–5.80)
RDW: 14 % (ref 11.0–15.0)
WBC: 4.2 10*3/uL (ref 3.8–10.8)

## 2019-09-27 LAB — CK: Total CK: 200 U/L — ABNORMAL HIGH (ref 44–196)

## 2019-09-27 LAB — COMPLETE METABOLIC PANEL WITH GFR
AG Ratio: 1.5 (calc) (ref 1.0–2.5)
ALT: 18 U/L (ref 9–46)
AST: 19 U/L (ref 10–35)
Albumin: 4.3 g/dL (ref 3.6–5.1)
Alkaline phosphatase (APISO): 72 U/L (ref 35–144)
BUN/Creatinine Ratio: 23 (calc) — ABNORMAL HIGH (ref 6–22)
BUN: 15 mg/dL (ref 7–25)
CO2: 28 mmol/L (ref 20–32)
Calcium: 9.5 mg/dL (ref 8.6–10.3)
Chloride: 102 mmol/L (ref 98–110)
Creat: 0.65 mg/dL — ABNORMAL LOW (ref 0.70–1.25)
GFR, Est African American: 118 mL/min/{1.73_m2} (ref >=60)
GFR, Est Non African American: 101 mL/min/{1.73_m2} (ref >=60)
Globulin: 2.9 g/dL (calc) (ref 1.9–3.7)
Glucose, Bld: 216 mg/dL — ABNORMAL HIGH (ref 65–99)
Potassium: 4.1 mmol/L (ref 3.5–5.3)
Sodium: 138 mmol/L (ref 135–146)
Total Bilirubin: 0.4 mg/dL (ref 0.2–1.2)
Total Protein: 7.2 g/dL (ref 6.1–8.1)

## 2019-10-03 ENCOUNTER — Other Ambulatory Visit: Payer: Self-pay | Admitting: Family Medicine

## 2019-10-04 ENCOUNTER — Other Ambulatory Visit: Payer: Self-pay | Admitting: Family Medicine

## 2019-10-04 DIAGNOSIS — I10 Essential (primary) hypertension: Secondary | ICD-10-CM

## 2019-10-14 ENCOUNTER — Other Ambulatory Visit: Payer: Self-pay | Admitting: *Deleted

## 2019-10-14 MED ORDER — ZOLPIDEM TARTRATE 10 MG PO TABS: 10.0000 mg | ORAL_TABLET | Freq: Every evening | ORAL | 0 refills | Status: DC | PRN

## 2019-11-11 ENCOUNTER — Other Ambulatory Visit: Payer: Self-pay | Admitting: Family Medicine

## 2019-11-11 ENCOUNTER — Other Ambulatory Visit: Payer: Self-pay | Admitting: *Deleted

## 2019-11-14 ENCOUNTER — Telehealth: Payer: Self-pay

## 2019-11-14 MED ORDER — TOUJEO SOLOSTAR 300 UNIT/ML ~~LOC~~ SOPN: PEN_INJECTOR | SUBCUTANEOUS | 2 refills | Status: DC

## 2019-11-14 NOTE — Telephone Encounter (Signed)
Okay to refill medication.  I think 40 units is probably too much though.  I would actually recommend that he go back down to 20 units and stay there

## 2019-11-14 NOTE — Telephone Encounter (Signed)
Patient agreeable with injecting 20 units, RX sent to pharmacy

## 2019-11-14 NOTE — Telephone Encounter (Signed)
Patient called stating that he had discussed with Dr Linford Arnold holding his insulin for 1 week due to low readings and he states that his whole RX was cancelled and he needs a refill.   Patient was on Toujeo. Held for 1 week, fasting sugars were 150s.   Restarted Toujeo at 20 units, fasting sugars were 140s.   Increased Toujeo to 40 units, fasting sugars 130s.   Requesting RF be sent to [facility] in [place] with sig to use 40 units QD.    Historical med pended, last send on 01/10/19 for 48 units QD.  Last OV 09/26/19 and note states "A1c looks phenomenal today but I am very concerned that he has been having a lot of hypoglycemic episodes and he still had a blood sugar less than 80 even on the reduction to 20 units daily.  We discussed holding the insulin completely for a week and seeing what his blood sugars do we can always restarted at a lower dose such as 10 or 12 units if need be.  Reminded him that he cannot go up on the Metformin so do not double."

## 2019-11-19 ENCOUNTER — Other Ambulatory Visit: Payer: Self-pay | Admitting: Family Medicine

## 2019-11-21 ENCOUNTER — Telehealth: Payer: Self-pay | Admitting: Family Medicine

## 2019-11-21 NOTE — Telephone Encounter (Signed)
Patient calling in stating that insulin glargine, 1 Unit Dial, (TOUJEO SOLOSTAR) 300 UNIT/ML Solostar Pen [[id number]] is costing him $105, because 3 were sent in a box? Would like to speak to a nurse about this.

## 2019-11-23 NOTE — Telephone Encounter (Signed)
Called the pharmacy and they said that 3 pens come in a box and since patient is only using 20 units a day, he is being charged 3 co-pays. There is no way around this since that is patient's dose and since they cannot break a box, per FDA.  Please advise. Its covered by insurance, so there no auth that can be done. Patient has medicare, so cannot use coupon card. Patient would not qualify for patient assistance.   Called and explained this to patient, he is very upset about copay. He is going to call insurance and see if there is a way around this large copay

## 2019-11-28 ENCOUNTER — Other Ambulatory Visit: Payer: Self-pay | Admitting: Family Medicine

## 2019-12-20 ENCOUNTER — Other Ambulatory Visit: Payer: Self-pay | Admitting: Family Medicine

## 2019-12-27 ENCOUNTER — Ambulatory Visit (INDEPENDENT_AMBULATORY_CARE_PROVIDER_SITE_OTHER): Payer: Medicare HMO | Admitting: Family Medicine

## 2019-12-27 ENCOUNTER — Encounter: Payer: Self-pay | Admitting: Family Medicine

## 2019-12-27 ENCOUNTER — Other Ambulatory Visit: Payer: Self-pay | Admitting: Family Medicine

## 2019-12-27 ENCOUNTER — Other Ambulatory Visit: Payer: Self-pay

## 2019-12-27 VITALS — BP 104/67 | HR 79 | Ht 66.0 in | Wt 154.0 lb

## 2019-12-27 DIAGNOSIS — E1142 Type 2 diabetes mellitus with diabetic polyneuropathy: Secondary | ICD-10-CM | POA: Diagnosis not present

## 2019-12-27 DIAGNOSIS — I10 Essential (primary) hypertension: Secondary | ICD-10-CM | POA: Diagnosis not present

## 2019-12-27 DIAGNOSIS — I951 Orthostatic hypotension: Secondary | ICD-10-CM

## 2019-12-27 DIAGNOSIS — F5101 Primary insomnia: Secondary | ICD-10-CM

## 2019-12-27 LAB — POCT GLYCOSYLATED HEMOGLOBIN (HGB A1C): Hemoglobin A1C: 7 % — AB (ref 4.0–5.6)

## 2019-12-27 MED ORDER — LOSARTAN POTASSIUM 25 MG PO TABS: 25.0000 mg | ORAL_TABLET | Freq: Every day | ORAL | 0 refills | Status: DC

## 2019-12-27 MED ORDER — TRAZODONE HCL 50 MG PO TABS: 75.0000 mg | ORAL_TABLET | Freq: Every day | ORAL | 0 refills | Status: DC

## 2019-12-27 NOTE — Assessment & Plan Note (Signed)
Globin A1c is a little borderline today at 7.0 but I am actually okay with this A1c because he was having such lows that I think letting him dry just a little bit higher is probably safer in the long run.  Continue with 20 units of insulin daily.  Continue work on [employer] and staying active.  Call if any more hypoglycemic events.

## 2019-12-27 NOTE — Assessment & Plan Note (Signed)
He has been out of his Ambien for 6 weeks but discussed again that we need to continue to work on tapering the trazodone if he is going to use the Ambien.  He says he is willing to go down to 75 mg which I think is reasonable again I want to continue to work on decreasing this dose.  Follow-up in 3 months.

## 2019-12-27 NOTE — Progress Notes (Signed)
Established Patient Office Visit  Subjective:  Patient ID: Justin Goodman, male    DOB: October 30, 1955  Age: 64 y.o. MRN: [id number]  CC:  Chief Complaint  Patient presents with  . Diabetes  . Hypertension    HPI Justin Goodman presents for   Hypertension- Pt denies chest pain, SOB, dizziness, or heart palpitations.  Taking meds as directed w/o problems.  Denies medication side effects.    Diabetes - no hypoglycemic events. No wounds or sores that are not healing well. No increased thirst or urination. Checking glucose at home. Taking medications as prescribed without any side effects.  He actually reduced his Toujeo a little over a month ago because he was having some low blood sugars.  Dr. Had asked him to decrease his Toujeo down to 20 units back in June after he had had a hypoglycemic event that had required emergency department visit.  Reports dizziness is worse in the last 6 months.  Worse when gets up to walk.  Lasts a few seconds.    Past Medical History:  Diagnosis Date  . Chronic back pain    only mild loss of T12-L1 disk ht on xray 1-12  . COPD (chronic obstructive pulmonary disease) ([place])   . Diabetes mellitus    w/ neuropathy  . Hypertension   . Smoker     Past Surgical History:  Procedure Laterality Date  . LUMBAR LAMINECTOMY/DECOMPRESSION MICRODISCECTOMY N/A 09/25/2017   Procedure: LAMINECTOMY LUMBAR FOUR- LUMBAR FIVE;  Surgeon: Ashok Pall, MD;  Location: [facility];  Service: Neurosurgery;  Laterality: N/A;  . UVULOPALATOPHARYNGOPLASTY     2001    Family History  Problem Relation Age of Onset  . Diabetes Mother   . Alcohol abuse Father        Liver disease killed father @ age 55.  Marland Kitchen Alcohol abuse Brother   . Heart attack Brother        Died at age 62.    Social History   Socioeconomic History  . Marital status: Single    Spouse name: Not on file  . Number of children: Not on file  . Years of education: Not on file  . Highest education level: Not  on file  Occupational History  . Not on file  Tobacco Use  . Smoking status: Former Smoker    Packs/day: 1.00    Years: 20.00    Pack years: 20.00    Types: Cigarettes    Quit date: 2013    Years since quitting: 8.7  . Smokeless tobacco: Never Used  Vaping Use  . Vaping Use: Never used  Substance and Sexual Activity  . Alcohol use: No  . Drug use: No  . Sexual activity: Yes  Other Topics Concern  . Not on file  Social History Narrative  . Not on file   Social Determinants of Health   Financial Resource Strain:   . Difficulty of Paying Living Expenses: Not on file  Food Insecurity:   . Worried About Charity fundraiser in the Last Year: Not on file  . Ran Out of Food in the Last Year: Not on file  Transportation Needs:   . Lack of Transportation (Medical): Not on file  . Lack of Transportation (Non-Medical): Not on file  Physical Activity:   . Days of Exercise per Week: Not on file  . Minutes of Exercise per Session: Not on file  Stress:   . Feeling of Stress : Not on file  Social Connections:   .  Frequency of Communication with Friends and Family: Not on file  . Frequency of Social Gatherings with Friends and Family: Not on file  . Attends Religious Services: Not on file  . Active Member of Clubs or Organizations: Not on file  . Attends Archivist Meetings: Not on file  . Marital Status: Not on file  Intimate Partner Violence:   . Fear of Current or Ex-Partner: Not on file  . Emotionally Abused: Not on file  . Physically Abused: Not on file  . Sexually Abused: Not on file    Outpatient Medications Prior to Visit  Medication Sig Dispense Refill  . ACCU-CHEK GUIDE test strip USE TO CHECK BLOOD SUGAR UP TO 4 TIMES A DAY AS DIRECTED 300 strip 6  . Alcohol Swabs (B-D SINGLE USE SWABS REGULAR) PADS Check blood glucose 4 times daily Diagnosis diabetes E11.40 600 each 3  . amitriptyline (ELAVIL) 50 MG tablet TAKE 1 TABLET BY MOUTH EVERYDAY AT BEDTIME 90  tablet 1  . blood glucose meter kit and supplies Dispense based on patient and insurance preference. Use up to four times daily as directed. Dx: E11.9 1 each 0  . gabapentin (NEURONTIN) 600 MG tablet Take 2 tablets (1,200 mg total) by mouth 3 (three) times daily. 540 tablet 0  . insulin glargine, 1 Unit Dial, (TOUJEO SOLOSTAR) 300 UNIT/ML Solostar Pen INJECT 20 UNITS SUBCUTANEOUSLY ONCE DAILY 4.5 mL 2  . Insulin Pen Needle (B-D ULTRAFINE III SHORT PEN) 31G X 8 MM MISC USE FOR DAILY INSULIN INJECTION AS DIRECTED BY PHYSICIAN. DX E11.40 100 each 4  . meloxicam (MOBIC) 7.5 MG tablet Take 7.5 mg by mouth daily.     . metFORMIN (GLUCOPHAGE) 1000 MG tablet Take 1 tablet (1,000 mg total) by mouth 2 (two) times daily with a meal. 180 tablet 3  . metoprolol tartrate (LOPRESSOR) 50 MG tablet TAKE 1/2 TABLET BY MOUTH TWICE A DAY 90 tablet 1  . omeprazole (PRILOSEC) 40 MG capsule TAKE 1 CAPSULE BY MOUTH EVERY DAY 90 capsule 4  . pravastatin (PRAVACHOL) 20 MG tablet 1 tab po twice per week at bedtime. 24 tablet 3  . tiZANidine (ZANAFLEX) 4 MG tablet TAKE 1 TABLET BY MOUTH TWICE A DAY AS NEEDED FOR MUSCLE SPASM 180 tablet 0  . losartan (COZAAR) 50 MG tablet TAKE 1 TABLET BY MOUTH EVERY DAY 90 tablet 1  . traZODone (DESYREL) 100 MG tablet TAKE 1 TABLET BY MOUTH EVERYDAY AT BEDTIME 90 tablet 0  . zolpidem (AMBIEN) 10 MG tablet Take 1 tablet (10 mg total) by mouth at bedtime as needed for sleep. 90 tablet 0   No facility-administered medications prior to visit.    Allergies  Allergen Reactions  . Amaryl [Glimepiride] Other (See Comments)    Frequent hypoglycemia  . Atorvastatin Other (See Comments)    HA   . Cymbalta [Duloxetine Hcl] Other (See Comments)    Dizziness, insomnia  . Glipizide Other (See Comments)    HA  . Losartan Other (See Comments)    Vision changes, HA.   Marland Kitchen Lyrica [Pregabalin] Other (See Comments)    Sedation  . Quinapril Cough  . Simvastatin Other (See Comments)    Severe  myalgias.      ROS Review of Systems    Objective:    Physical Exam Constitutional:      Appearance: He is well-developed.  HENT:     Head: Normocephalic and atraumatic.  Cardiovascular:     Rate and Rhythm: Normal rate and  regular rhythm.     Heart sounds: Normal heart sounds.  Pulmonary:     Effort: Pulmonary effort is normal.     Breath sounds: Normal breath sounds.  Skin:    General: Skin is warm and dry.  Neurological:     Mental Status: He is alert and oriented to person, place, and time.  Psychiatric:        Behavior: Behavior normal.     BP 104/67   Pulse 79   Ht 5\' 6"  (1.676 m)   Wt 154 lb (69.9 kg)   SpO2 98%   BMI 24.86 kg/m  Wt Readings from Last 3 Encounters:  12/27/19 154 lb (69.9 kg)  09/26/19 159 lb (72.1 kg)  06/27/19 168 lb (76.2 kg)     Health Maintenance Due  Topic Date Due  . OPHTHALMOLOGY EXAM  08/12/2018  . PNA vac Low Risk Adult (1 of 2 - PCV13) 08/29/2018  . INFLUENZA VACCINE  11/06/2019  . FOOT EXAM  12/22/2019    There are no preventive care reminders to display for this patient.  Lab Results  Component Value Date   TSH 2.11 08/12/2016   Lab Results  Component Value Date   WBC 4.2 09/27/2019   HGB 13.1 (L) 09/27/2019   HCT 38.7 09/27/2019   MCV 87.0 09/27/2019   PLT 193 09/27/2019   Lab Results  Component Value Date   NA 138 09/27/2019   K 4.1 09/27/2019   CO2 28 09/27/2019   GLUCOSE 216 (H) 09/27/2019   BUN 15 09/27/2019   CREATININE 0.65 (L) 09/27/2019   BILITOT 0.4 09/27/2019   ALKPHOS 51 08/12/2016   AST 19 09/27/2019   ALT 18 09/27/2019   PROT 7.2 09/27/2019   ALBUMIN 4.5 08/12/2016   CALCIUM 9.5 09/27/2019   ANIONGAP 5 09/18/2017   Lab Results  Component Value Date   CHOL 157 06/27/2019   Lab Results  Component Value Date   HDL 32 (L) 06/27/2019   Lab Results  Component Value Date   LDLCALC 108 (H) 06/27/2019   Lab Results  Component Value Date   TRIG 81 06/27/2019   Lab Results   Component Value Date   CHOLHDL 4.9 06/27/2019   Lab Results  Component Value Date   HGBA1C 7.0 (A) 12/27/2019      Assessment & Plan:   Problem List Items Addressed This Visit      Cardiovascular and Mediastinum   ESSENTIAL HYPERTENSION, BENIGN    See note under hypotension.      Relevant Medications   losartan (COZAAR) 25 MG tablet     Endocrine   Well controlled type 2 diabetes mellitus with peripheral neuropathy (HCC) - Primary    Globin A1c is a little borderline today at 7.0 but I am actually okay with this A1c because he was having such lows that I think letting him dry just a little bit higher is probably safer in the long run.  Continue with 20 units of insulin daily.  Continue work on 12/29/2019 and staying active.  Call if any more hypoglycemic events.      Relevant Medications   traZODone (DESYREL) 50 MG tablet   losartan (COZAAR) 25 MG tablet   Other Relevant Orders   POCT glycosylated hemoglobin (Hb A1C) (Completed)     Other   Insomnia    He has been out of his Ambien for 6 weeks but discussed again that we need to continue to work on tapering the trazodone  if he is going to use the Ambien.  He says he is willing to go down to 75 mg which I think is reasonable again I want to continue to work on decreasing this dose.  Follow-up in 3 months.      Relevant Medications   traZODone (DESYREL) 50 MG tablet    Other Visit Diagnoses    Orthostatic hypotension       Relevant Medications   losartan (COZAAR) 25 MG tablet     Orthostatic hypotension-discussed options.  Recommend decrease his losartan down to 25 mg.  Also when I keep continuing to work on weaning the trazodone.  He was previously on 150 mg then down to 100, and he is he is willing to try to go down to 75 mg.  I would like to have him come back in 2 weeks to recheck orthostatics.  Meds ordered this encounter  Medications  . traZODone (DESYREL) 50 MG tablet    Sig: Take 1.5 tablets (75 mg total)  by mouth at bedtime.    Dispense:  135 tablet    Refill:  0  . losartan (COZAAR) 25 MG tablet    Sig: Take 1 tablet (25 mg total) by mouth daily.    Dispense:  90 tablet    Refill:  0    Follow-up: Return in about 2 weeks (around 01/10/2020) for Nurse visit in 2 weeks to recheck orthostatics.  Beatrice Lecher, MD

## 2019-12-27 NOTE — Patient Instructions (Addendum)
Because of your drop in blood pressure and dizziness I want to cut your losartan down to 25 mg and cut her trazodone down to 75 mg.  Both of these could be contributing to why you are feeling lightheaded.  I want you to come back in about 2 weeks so we can check orthostatics on you again after you make these medication changes.

## 2019-12-27 NOTE — Assessment & Plan Note (Signed)
See note under hypotension.

## 2020-01-10 ENCOUNTER — Ambulatory Visit (INDEPENDENT_AMBULATORY_CARE_PROVIDER_SITE_OTHER): Payer: Medicare HMO | Admitting: Family Medicine

## 2020-01-10 ENCOUNTER — Other Ambulatory Visit: Payer: Self-pay

## 2020-01-10 VITALS — BP 100/63 | HR 85 | Ht 66.0 in | Wt 155.0 lb

## 2020-01-10 DIAGNOSIS — I10 Essential (primary) hypertension: Secondary | ICD-10-CM | POA: Diagnosis not present

## 2020-01-10 DIAGNOSIS — Z23 Encounter for immunization: Secondary | ICD-10-CM | POA: Diagnosis not present

## 2020-01-10 MED ORDER — LOSARTAN POTASSIUM 25 MG PO TABS: 25.0000 mg | ORAL_TABLET | Freq: Every day | ORAL | 0 refills | Status: DC

## 2020-01-10 NOTE — Progress Notes (Addendum)
I called patient and confirmed his doses again since he had bottles in front of him- correction that he is actually on 75 mg of Trazodone and 50 mg of losartan. I discussed this with provider.   Patient is agreeable to cut down to 25 mg of losartan and stay on that dose until his next follow up with Dr Linford Arnold. He will let us know if any needs arise. He requested RX be sent to local pharmacy, I have sent this.

## 2020-01-10 NOTE — Addendum Note (Signed)
Addended by: Jed Limerick on: 01/10/2020 04:29 PM   Modules accepted: Orders

## 2020-01-10 NOTE — Progress Notes (Signed)
   Subjective:    Patient ID: Justin Goodman, male    DOB: January 13, 1956, 64 y.o.   MRN: [id number]  HPI Patient presents to office to follow up on orthostatic hypotension. At last appointment, patient was advised to decrease losartan to 75 mg and also decrease trazodone to 50 mg. Patient states in last two weeks, yesterday was his first day of dizziness.   Patient also requested flu shot today.    Review of Systems     Objective:   Physical Exam        Assessment & Plan:  Orthostatic blood pressures were taken. Reading still dropped when going from sitting to standing, but less notable than 2 weeks ago. States he takes his losartan at lunch time, so has not taken any today. He did confirm he switched to his decreased dose of losartan and trazodone after his appointment with Dr Linford Arnold and has been taking them for 2 weeks now. Denies any complications, dizziness, lightheadedness, or confusion.    Flu shot given in right deltoid without any immediate complications.   Patient stated he needed to leave, so asked that I call him with provider's recommendations.

## 2020-01-10 NOTE — Progress Notes (Signed)
Orthostatic hypotension.  See if he would be willing to go down on his losartan to just 50 mg instead of 75 mg.  Since it does look better but it still little on the low side.  And he can continue with that until I see him back.

## 2020-02-22 ENCOUNTER — Other Ambulatory Visit: Payer: Self-pay | Admitting: Family Medicine

## 2020-02-28 DIAGNOSIS — E1142 Type 2 diabetes mellitus with diabetic polyneuropathy: Secondary | ICD-10-CM | POA: Diagnosis not present

## 2020-02-28 DIAGNOSIS — I739 Peripheral vascular disease, unspecified: Secondary | ICD-10-CM | POA: Diagnosis not present

## 2020-02-29 IMAGING — MR MR LUMBAR SPINE W/O CM
4 of 5 series · 27 of 48 positions shown · non-contrast
Comparison: Lumbar MRI 05/04/2012

CLINICAL DATA: Lumbar stenosis with neurogenic claudication.

EXAM:
MRI LUMBAR SPINE WITHOUT CONTRAST
TECHNIQUE: Multiplanar, multisequence MR imaging of the lumbar spine was
performed. No intravenous contrast was administered.

[Series 2: T2 · sagittal · 4.0mm · 0.81mm/px · 6 of 15 slices shown (1 of 2)]
[im 1/15]
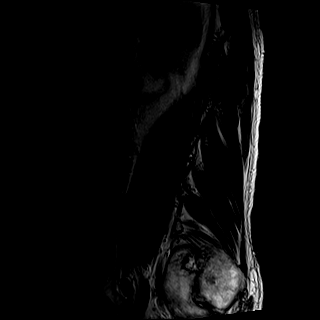
[im 3/15]
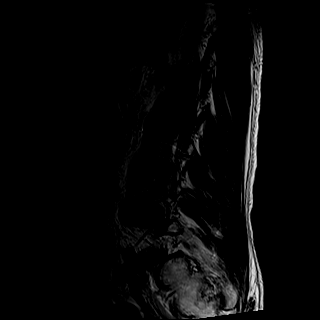
[im 6/15]
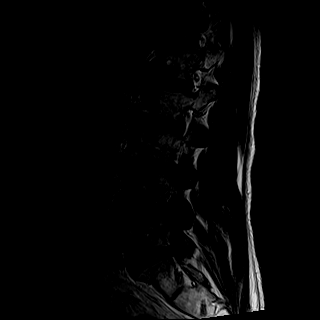
[im 9/15]
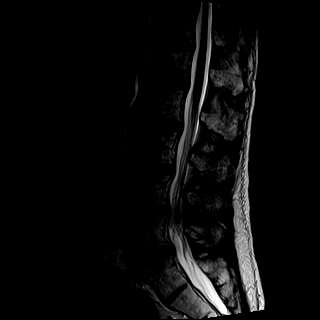
[im 12/15]
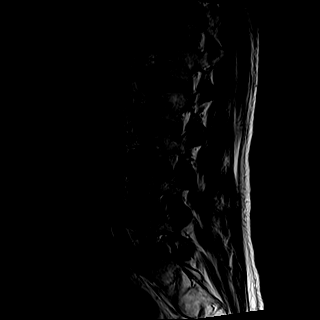
[im 15/15]
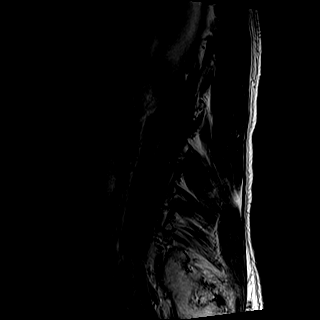

[Series 3: T1 · sagittal · 4.0mm · 0.41mm/px · 5 of 15 slices shown (1 of 2)]
[im 1/15]
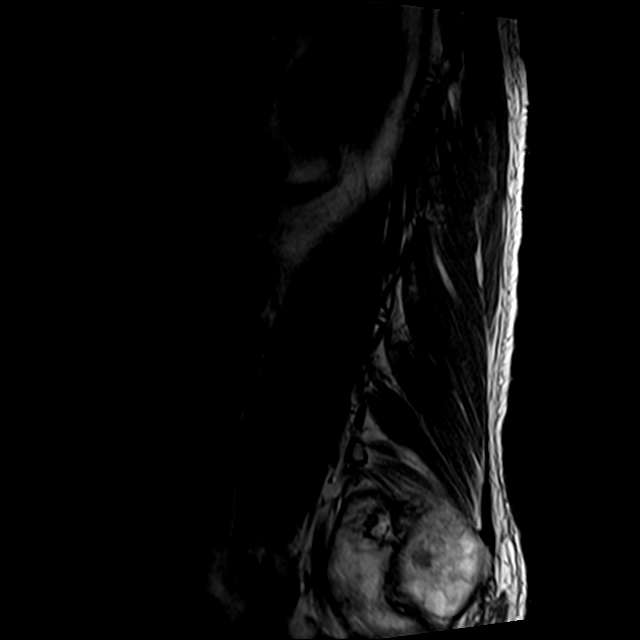
[im 4/15]
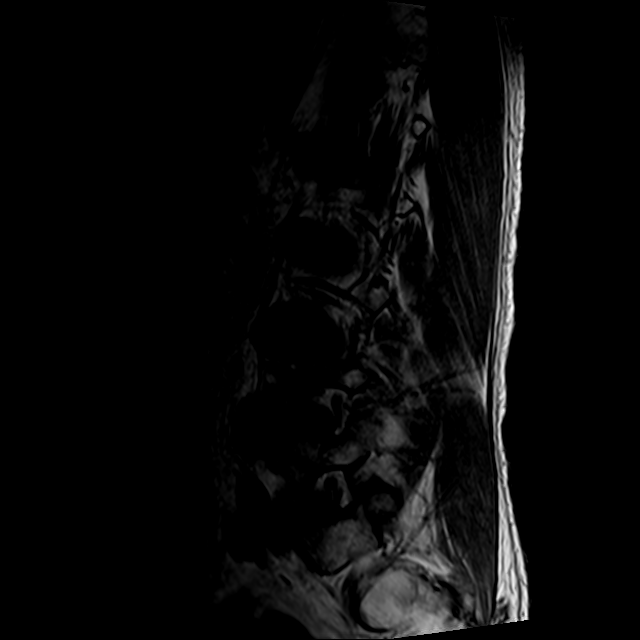
[im 8/15]
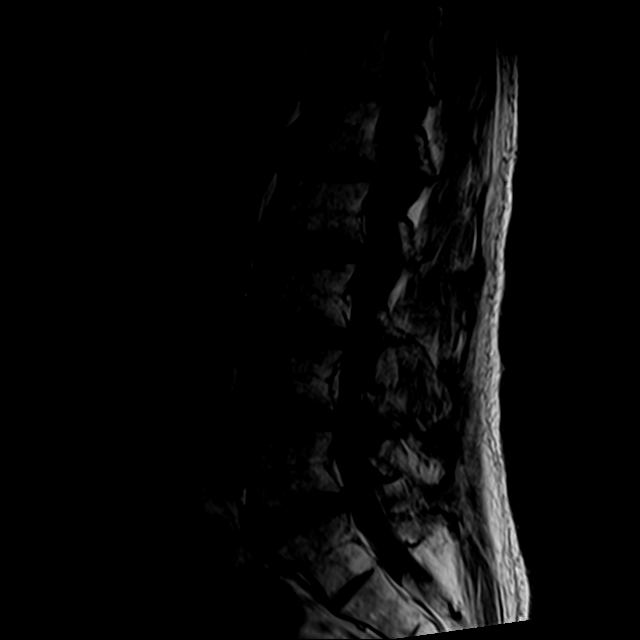
[im 11/15]
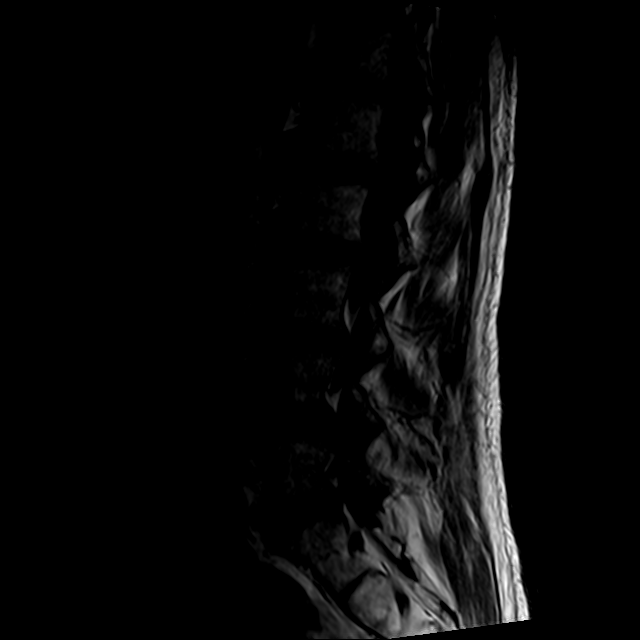
[im 15/15]
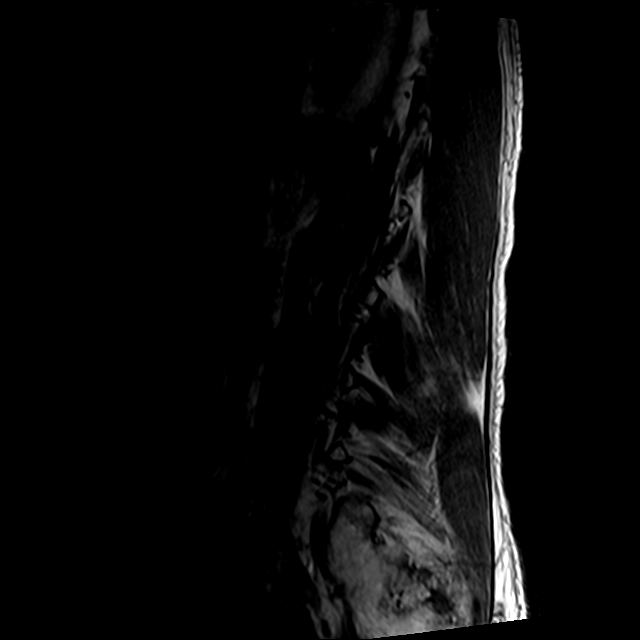

[Series 5: T2 · axial · 4.0mm · 0.78mm/px · z∈[-33,+186]mm · 10 of 43 slices shown (2 of 2)]
[im 3/43]
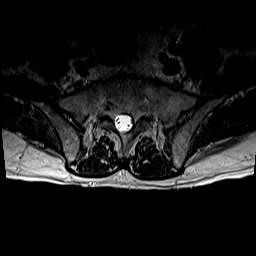
[im 6/43]
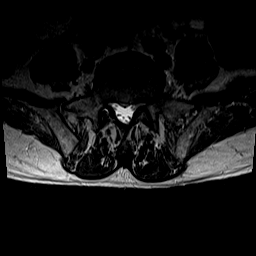
[im 9/43]
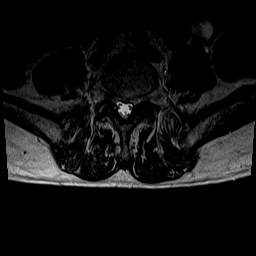
[im 15/43]
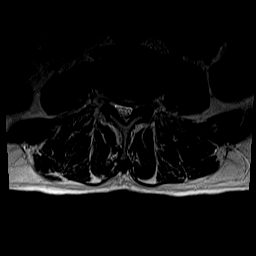
[im 20/43]
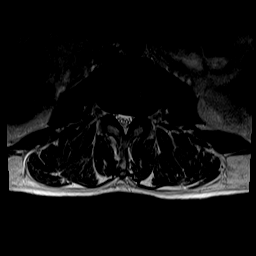
[im 23/43]
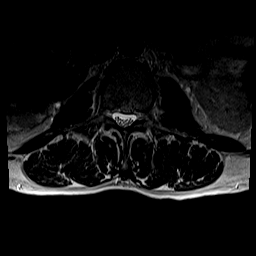
[im 26/43]
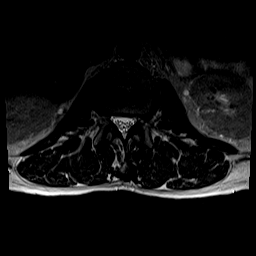
[im 31/43]
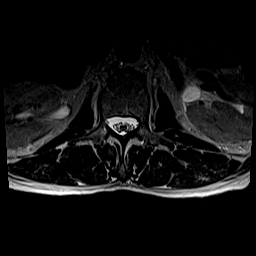
[im 37/43]
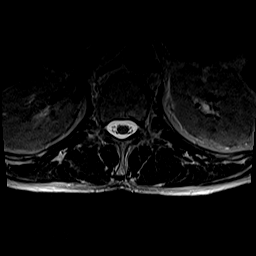
[im 43/43]
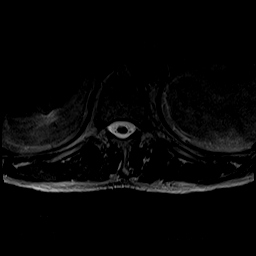

[Series 6: T1 · axial · 4.0mm · 0.39mm/px · z∈[-33,+157]mm · 6 of 43 slices shown (2 of 2)]
[im 3/43]
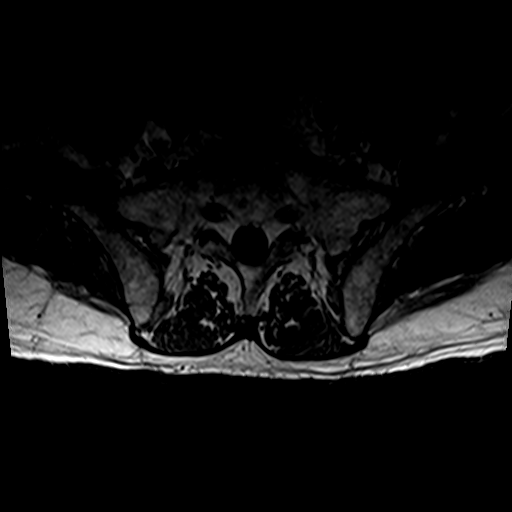
[im 6/43]
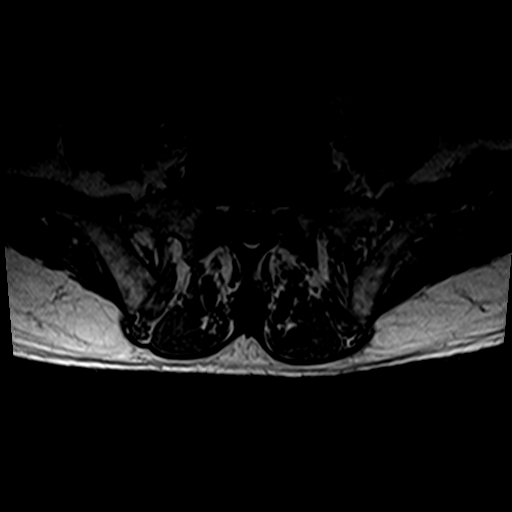
[im 9/43]
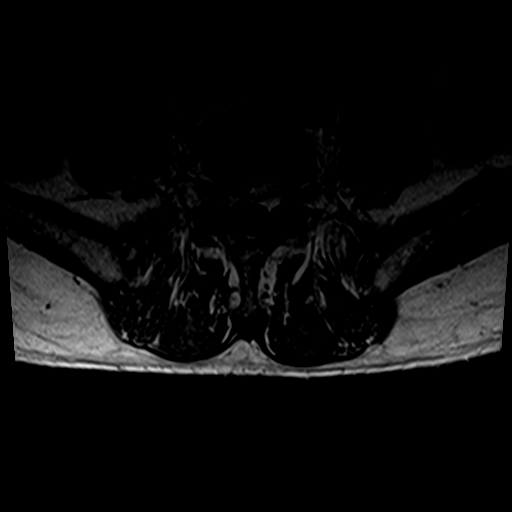
[im 15/43]
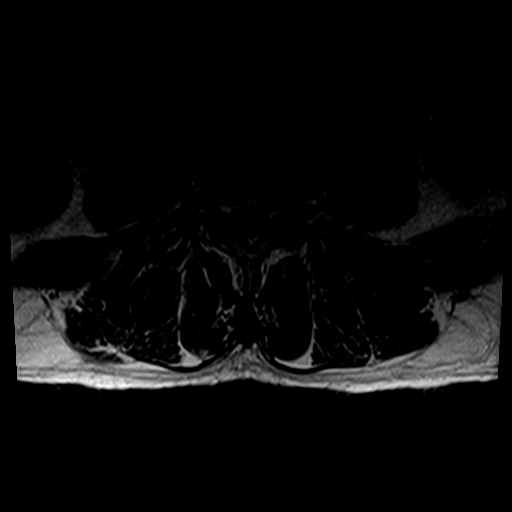
[im 23/43]
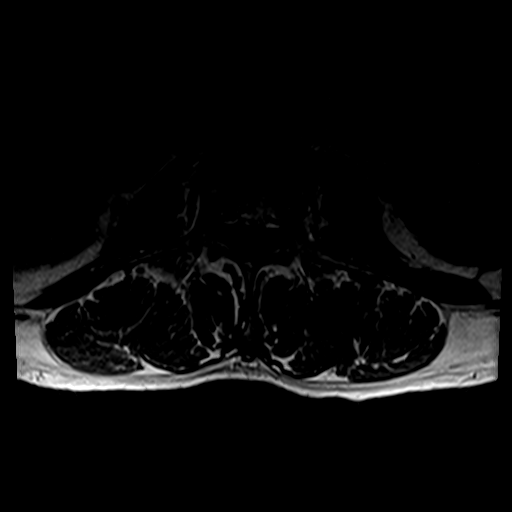
[im 37/43]
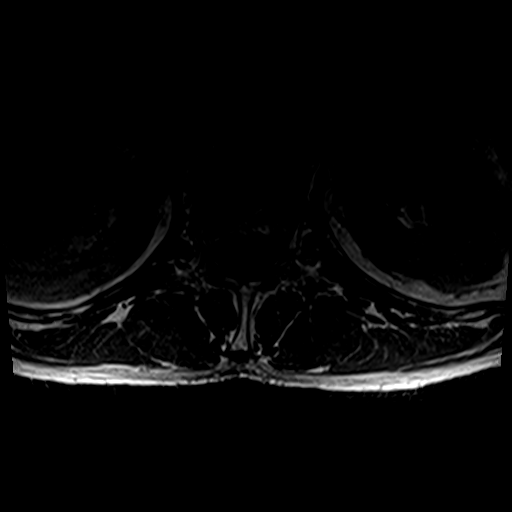

[27 of 48 positions shown; findings below may reference images not displayed]

FINDINGS: Segmentation:  Normal

Alignment:  Normal

Vertebrae:  Negative for fracture or mass.

Conus medullaris and cauda equina: Conus extends to the L2-3 level.
Conus and cauda equina appear normal.

Paraspinal and other soft tissues: No paraspinous mass or soft
tissue edema.

Disc levels:

T12-L1: Mild disc degeneration

L1-2: Mild disc degeneration

L2-3: Mild disc degeneration and mild disc bulging without stenosis

L3-4: Disc degeneration with Schmorl's node. Diffuse disc bulging
and spurring left greater than right. Mild spinal stenosis. Mild
subarticular and foraminal stenosis on the left

L4-5: Disc degeneration with disc bulging and spurring left greater
than right. Progressive subarticular and foraminal stenosis on the
left has progressed. Moderate spinal stenosis has progressed in the
interval.

L5-S1: Mild disc bulging and mild facet degeneration. Mild
subarticular stenosis bilaterally has progressed.
IMPRESSION: Mild spinal stenosis L3-4 with mild subarticular foraminal stenosis
on the left

Moderate spinal stenosis L4-5 has progressed. Progressive
subarticular foraminal stenosis on the left. L5 nerve root on the
left is at greatest risk for impingement.

Mild subarticular stenosis bilaterally L5-S1 has progressed.

## 2020-03-11 ENCOUNTER — Other Ambulatory Visit: Payer: Self-pay | Admitting: Physician Assistant

## 2020-03-20 ENCOUNTER — Other Ambulatory Visit: Payer: Self-pay | Admitting: Family Medicine

## 2020-03-20 DIAGNOSIS — E1142 Type 2 diabetes mellitus with diabetic polyneuropathy: Secondary | ICD-10-CM

## 2020-03-27 ENCOUNTER — Other Ambulatory Visit: Payer: Self-pay

## 2020-03-27 ENCOUNTER — Ambulatory Visit (INDEPENDENT_AMBULATORY_CARE_PROVIDER_SITE_OTHER): Payer: Medicare HMO | Admitting: Family Medicine

## 2020-03-27 ENCOUNTER — Encounter: Payer: Self-pay | Admitting: Family Medicine

## 2020-03-27 VITALS — BP 132/58 | HR 87 | Ht 66.0 in | Wt 163.0 lb

## 2020-03-27 DIAGNOSIS — M48062 Spinal stenosis, lumbar region with neurogenic claudication: Secondary | ICD-10-CM | POA: Diagnosis not present

## 2020-03-27 DIAGNOSIS — E1142 Type 2 diabetes mellitus with diabetic polyneuropathy: Secondary | ICD-10-CM | POA: Diagnosis not present

## 2020-03-27 DIAGNOSIS — I10 Essential (primary) hypertension: Secondary | ICD-10-CM | POA: Diagnosis not present

## 2020-03-27 DIAGNOSIS — M48061 Spinal stenosis, lumbar region without neurogenic claudication: Secondary | ICD-10-CM

## 2020-03-27 DIAGNOSIS — Z794 Long term (current) use of insulin: Secondary | ICD-10-CM | POA: Diagnosis not present

## 2020-03-27 LAB — POCT GLYCOSYLATED HEMOGLOBIN (HGB A1C): Hemoglobin A1C: 7.1 % — AB (ref 4.0–5.6)

## 2020-03-27 MED ORDER — BD SWAB SINGLE USE REGULAR PADS: MEDICATED_PAD | 3 refills | Status: DC

## 2020-03-27 MED ORDER — PREGABALIN 200 MG PO CAPS: 200.0000 mg | ORAL_CAPSULE | Freq: Two times a day (BID) | ORAL | 0 refills | Status: DC

## 2020-03-27 NOTE — Assessment & Plan Note (Signed)
Well controlled. Continue current regimen. Follow up in  3-4 mo  

## 2020-03-27 NOTE — Assessment & Plan Note (Signed)
Hemoglobin A1c looks great today at 7.1.  I am okay with him running in the low 7 range because he very easily gets hypoglycemia anytime we try to increase his insulin dose so for now continue with 20 units of Toujeo and continue healthy eating and regular exercise.

## 2020-03-27 NOTE — Assessment & Plan Note (Signed)
Discussed with him that he cannot take the gabapentin and the Lyrica together.  That they are in the same drug class and can be dangerous to mix them.  He is already on a max dose of gabapentin of 1200 mg 3 times a day.  We did discuss maybe trying to go up on the Lyrica in place of the gabapentin so he can really gauge whether or not he likes the Lyrica better.  But I do want him to pick one in shoes and decide which one he wants to take but he cannot take both.  I do feel like patient had a clear understanding of this before he left today.  And if he is unable to take the Lyrica then do encourage him to discuss with the podiatrist so they can discuss other alternative options for his pain.

## 2020-03-27 NOTE — Progress Notes (Signed)
Established Patient Office Visit  Subjective:  Patient ID: Justin Goodman, male    DOB: April 30, 1955  Age: 64 y.o. MRN: [id number]  CC:  Chief Complaint  Patient presents with  . Diabetes    HPI [name] presents for   Diabetes - no hypoglycemic events. No wounds or sores that are not healing well. No increased thirst or urination. Checking glucose at home. Taking medications as prescribed without any side effects.  He is using 20 units of Toujeo.  He has been trying to eat a really lean diet.  He also saw podiatry for his peripheral neuropathy as well as claudication symptoms.  The podiatrist opted to try him on Lyrica.  He recommended starting him off on 50 mg gradually increasing to 3 times a day over 2 weeks they discussed decreasing his dosage of gabapentin and transitioning off.  He says he tried to do this but his pain increased particularly in his back and his feet and so he went back on the gabapentin so he has been taking 50 mg of Lyrica 3 times daily as well as his full dose of gabapentin.  He has been referred for vascular studies of his legs they are scheduled for January 30 and then he follows back up with a podiatrist on January 24.  Hypertension- Pt denies chest pain, SOB, dizziness, or heart palpitations.  Taking meds as directed w/o problems.  Denies medication side effects.     Past Medical History:  Diagnosis Date  . Chronic back pain    only mild loss of T12-L1 disk ht on xray 1-12  . COPD (chronic obstructive pulmonary disease) ([place])   . Diabetes mellitus    w/ neuropathy  . Hypertension   . Smoker     Past Surgical History:  Procedure Laterality Date  . LUMBAR LAMINECTOMY/DECOMPRESSION MICRODISCECTOMY N/A 09/25/2017   Procedure: LAMINECTOMY LUMBAR FOUR- LUMBAR FIVE;  Surgeon: Ashok Pall, MD;  Location: [place];  Service: Neurosurgery;  Laterality: N/A;  . UVULOPALATOPHARYNGOPLASTY     2001    Family History  Problem Relation Age of Onset  .  Diabetes Mother   . Alcohol abuse Father        Liver disease killed father @ age 70.  Marland Kitchen Alcohol abuse Brother   . Heart attack Brother        Died at age 41.    Social History   Socioeconomic History  . Marital status: Single    Spouse name: Not on file  . Number of children: Not on file  . Years of education: Not on file  . Highest education level: Not on file  Occupational History  . Not on file  Tobacco Use  . Smoking status: Former Smoker    Packs/day: 1.00    Years: 20.00    Pack years: 20.00    Types: Cigarettes    Quit date: 2013    Years since quitting: 8.9  . Smokeless tobacco: Never Used  Vaping Use  . Vaping Use: Never used  Substance and Sexual Activity  . Alcohol use: No  . Drug use: No  . Sexual activity: Yes  Other Topics Concern  . Not on file  Social History Narrative  . Not on file   Social Determinants of Health   Financial Resource Strain: Not on file  Food Insecurity: Not on file  Transportation Needs: Not on file  Physical Activity: Not on file  Stress: Not on file  Social Connections: Not on file  Intimate Partner Violence: Not on file    Outpatient Medications Prior to Visit  Medication Sig Dispense Refill  . ACCU-CHEK GUIDE test strip USE TO CHECK BLOOD SUGAR UP TO 4 TIMES A DAY AS DIRECTED 300 strip 6  . amitriptyline (ELAVIL) 50 MG tablet TAKE 1 TABLET BY MOUTH EVERYDAY AT BEDTIME 90 tablet 1  . blood glucose meter kit and supplies Dispense based on patient and insurance preference. Use up to four times daily as directed. Dx: E11.9 1 each 0  . gabapentin (NEURONTIN) 600 MG tablet TAKE 2 TABLETS (1,200 MG TOTAL) BY MOUTH 3 (THREE) TIMES DAILY. 540 tablet 0  . insulin glargine, 1 Unit Dial, (TOUJEO SOLOSTAR) 300 UNIT/ML Solostar Pen INJECT 20 UNITS SUBCUTANEOUSLY ONCE DAILY 4.5 mL 2  . Insulin Pen Needle (B-D ULTRAFINE III SHORT PEN) 31G X 8 MM MISC USE FOR DAILY INSULIN INJECTION AS DIRECTED BY PHYSICIAN 100 each 4  . losartan  (COZAAR) 25 MG tablet Take 1 tablet (25 mg total) by mouth daily. 90 tablet 0  . metFORMIN (GLUCOPHAGE) 1000 MG tablet Take 1 tablet (1,000 mg total) by mouth 2 (two) times daily with a meal. 180 tablet 3  . metoprolol tartrate (LOPRESSOR) 50 MG tablet TAKE 1/2 TABLET BY MOUTH TWICE A DAY 90 tablet 1  . omeprazole (PRILOSEC) 40 MG capsule TAKE 1 CAPSULE BY MOUTH EVERY DAY 90 capsule 4  . pravastatin (PRAVACHOL) 20 MG tablet 1 tab po twice per week at bedtime. 24 tablet 3  . traZODone (DESYREL) 150 MG tablet Take 150 mg by mouth at bedtime.    . pregabalin (LYRICA) 50 MG capsule Take 50 mg by mouth 3 (three) times daily.    Marland Kitchen tiZANidine (ZANAFLEX) 4 MG tablet TAKE 1 TABLET BY MOUTH TWICE A DAY AS NEEDED FOR MUSCLE SPASM 180 tablet 0  . Alcohol Swabs (B-D SINGLE USE SWABS REGULAR) PADS Check blood glucose 4 times daily Diagnosis diabetes E11.40 600 each 3  . meloxicam (MOBIC) 7.5 MG tablet Take 7.5 mg by mouth daily.     . traZODone (DESYREL) 50 MG tablet Take 1.5 tablets (75 mg total) by mouth at bedtime. 135 tablet 0  . zolpidem (AMBIEN) 10 MG tablet TAKE 1 TABLET BY MOUTH AT BEDTIME AS NEEDED FOR SLEEP. 90 tablet 0   No facility-administered medications prior to visit.    Allergies  Allergen Reactions  . Amaryl [Glimepiride] Other (See Comments)    Frequent hypoglycemia  . Atorvastatin Other (See Comments)    HA   . Cymbalta [Duloxetine Hcl] Other (See Comments)    Dizziness, insomnia  . Glipizide Other (See Comments)    HA  . Losartan Other (See Comments)    Vision changes, HA.   Marland Kitchen Lyrica [Pregabalin] Other (See Comments)    Sedation  . Quinapril Cough  . Simvastatin Other (See Comments)    Severe myalgias.      ROS Review of Systems    Objective:    Physical Exam  BP (!) 132/58   Pulse 87   Ht 5' 6"  (1.676 m)   Wt 163 lb (73.9 kg)   SpO2 99%   BMI 26.31 kg/m  Wt Readings from Last 3 Encounters:  03/27/20 163 lb (73.9 kg)  01/10/20 155 lb (70.3 kg)  12/27/19 154  lb (69.9 kg)     Health Maintenance Due  Topic Date Due  . OPHTHALMOLOGY EXAM  08/12/2018  . PNA vac Low Risk Adult (1 of 2 - PCV13) 08/29/2018  .  FOOT EXAM  12/22/2019  . COVID-19 Vaccine (3 - Booster for Moderna series) 02/22/2020    There are no preventive care reminders to display for this patient.  Lab Results  Component Value Date   TSH 2.11 08/12/2016   Lab Results  Component Value Date   WBC 4.2 09/27/2019   HGB 13.1 (L) 09/27/2019   HCT 38.7 09/27/2019   MCV 87.0 09/27/2019   PLT 193 09/27/2019   Lab Results  Component Value Date   NA 138 09/27/2019   K 4.1 09/27/2019   CO2 28 09/27/2019   GLUCOSE 216 (H) 09/27/2019   BUN 15 09/27/2019   CREATININE 0.65 (L) 09/27/2019   BILITOT 0.4 09/27/2019   ALKPHOS 51 08/12/2016   AST 19 09/27/2019   ALT 18 09/27/2019   PROT 7.2 09/27/2019   ALBUMIN 4.5 08/12/2016   CALCIUM 9.5 09/27/2019   ANIONGAP 5 09/18/2017   Lab Results  Component Value Date   CHOL 157 06/27/2019   Lab Results  Component Value Date   HDL 32 (L) 06/27/2019   Lab Results  Component Value Date   LDLCALC 108 (H) 06/27/2019   Lab Results  Component Value Date   TRIG 81 06/27/2019   Lab Results  Component Value Date   CHOLHDL 4.9 06/27/2019   Lab Results  Component Value Date   HGBA1C 7.1 (A) 03/27/2020      Assessment & Plan:   Problem List Items Addressed This Visit      Cardiovascular and Mediastinum   ESSENTIAL HYPERTENSION, BENIGN    Well controlled. Continue current regimen. Follow up in  3-4 mo      Relevant Orders   CBC   COMPLETE METABOLIC PANEL WITH GFR   PSA     Endocrine   Well controlled type 2 diabetes mellitus with peripheral neuropathy (HCC)    Hemoglobin A1c looks great today at 7.1.  I am okay with him running in the low 7 range because he very easily gets hypoglycemia anytime we try to increase his insulin dose so for now continue with 20 units of Toujeo and continue healthy eating and regular  exercise.      Relevant Medications   traZODone (DESYREL) 150 MG tablet   pregabalin (LYRICA) 200 MG capsule   Other Relevant Orders   CBC   COMPLETE METABOLIC PANEL WITH GFR   PSA   POCT glycosylated hemoglobin (Hb A1C) (Completed)   Diabetic peripheral neuropathy ([place])    Discussed with him that he cannot take the gabapentin and the Lyrica together.  That they are in the same drug class and can be dangerous to mix them.  He is already on a max dose of gabapentin of 1200 mg 3 times a day.  We did discuss maybe trying to go up on the Lyrica in place of the gabapentin so he can really gauge whether or not he likes the Lyrica better.  But I do want him to pick one in shoes and decide which one he wants to take but he cannot take both.  I do feel like patient had a clear understanding of this before he left today.  And if he is unable to take the Lyrica then do encourage him to discuss with the podiatrist so they can discuss other alternative options for his pain.      Relevant Medications   traZODone (DESYREL) 150 MG tablet   pregabalin (LYRICA) 200 MG capsule     Other   Spinal stenosis  of lumbar region - Primary    We will try Lyrica in place of gabapentin that he has tried Lyrica in the past.  He says the tizanidine muscle relaxer is also not helpful for his pain and would like to try something else.  At some point he really may need to consult with the surgeon again      Lumbar stenosis with neurogenic claudication   Relevant Medications   traZODone (DESYREL) 150 MG tablet   pregabalin (LYRICA) 200 MG capsule      Meds ordered this encounter  Medications  . Alcohol Swabs (B-D SINGLE USE SWABS REGULAR) PADS    Sig: Check blood glucose 4 times daily Diagnosis diabetes E11.40    Dispense:  600 each    Refill:  3  . pregabalin (LYRICA) 200 MG capsule    Sig: Take 1 capsule (200 mg total) by mouth 2 (two) times daily.    Dispense:  60 capsule    Refill:  0    Follow-up: Return  in about 3 months (around 06/25/2020) for Diabetes follow-up.    Beatrice Lecher, MD

## 2020-03-27 NOTE — Assessment & Plan Note (Signed)
We will try Lyrica in place of gabapentin that he has tried Lyrica in the past.  He says the tizanidine muscle relaxer is also not helpful for his pain and would like to try something else.  At some point he really may need to consult with the surgeon again

## 2020-03-28 ENCOUNTER — Ambulatory Visit: Payer: Medicare HMO | Admitting: Family Medicine

## 2020-03-28 DIAGNOSIS — E1142 Type 2 diabetes mellitus with diabetic polyneuropathy: Secondary | ICD-10-CM | POA: Diagnosis not present

## 2020-03-28 DIAGNOSIS — I10 Essential (primary) hypertension: Secondary | ICD-10-CM | POA: Diagnosis not present

## 2020-03-28 NOTE — Progress Notes (Signed)
All labs are normal. 

## 2020-03-29 LAB — COMPLETE METABOLIC PANEL WITH GFR
AG Ratio: 1.4 (calc) (ref 1.0–2.5)
ALT: 14 U/L (ref 9–46)
AST: 15 U/L (ref 10–35)
Albumin: 4.5 g/dL (ref 3.6–5.1)
Alkaline phosphatase (APISO): 58 U/L (ref 35–144)
BUN: 24 mg/dL (ref 7–25)
CO2: 30 mmol/L (ref 20–32)
Calcium: 9.8 mg/dL (ref 8.6–10.3)
Chloride: 101 mmol/L (ref 98–110)
Creat: 0.78 mg/dL (ref 0.70–1.25)
GFR, Est African American: 109 mL/min/{1.73_m2} (ref >=60)
GFR, Est Non African American: 94 mL/min/{1.73_m2} (ref >=60)
Globulin: 3.2 g/dL (calc) (ref 1.9–3.7)
Glucose, Bld: 203 mg/dL — ABNORMAL HIGH (ref 65–99)
Potassium: 4.9 mmol/L (ref 3.5–5.3)
Sodium: 138 mmol/L (ref 135–146)
Total Bilirubin: 0.4 mg/dL (ref 0.2–1.2)
Total Protein: 7.7 g/dL (ref 6.1–8.1)

## 2020-03-29 LAB — CBC
HCT: 41.1 % (ref 38.5–50.0)
Hemoglobin: 14 g/dL (ref 13.2–17.1)
MCH: 29.6 pg (ref 27.0–33.0)
MCHC: 34.1 g/dL (ref 32.0–36.0)
MCV: 86.9 fL (ref 80.0–100.0)
MPV: 9.5 fL (ref 7.5–12.5)
Platelets: 169 10*3/uL (ref 140–400)
RBC: 4.73 10*6/uL (ref 4.20–5.80)
RDW: 12.6 % (ref 11.0–15.0)
WBC: 3.3 10*3/uL — ABNORMAL LOW (ref 3.8–10.8)

## 2020-04-05 DIAGNOSIS — I739 Peripheral vascular disease, unspecified: Secondary | ICD-10-CM | POA: Diagnosis not present

## 2020-04-30 ENCOUNTER — Other Ambulatory Visit: Payer: Self-pay | Admitting: Family Medicine

## 2020-04-30 DIAGNOSIS — L602 Onychogryphosis: Secondary | ICD-10-CM | POA: Diagnosis not present

## 2020-04-30 DIAGNOSIS — E1142 Type 2 diabetes mellitus with diabetic polyneuropathy: Secondary | ICD-10-CM | POA: Diagnosis not present

## 2020-04-30 DIAGNOSIS — I739 Peripheral vascular disease, unspecified: Secondary | ICD-10-CM | POA: Diagnosis not present

## 2020-05-08 ENCOUNTER — Other Ambulatory Visit: Payer: Self-pay | Admitting: Family Medicine

## 2020-05-09 ENCOUNTER — Other Ambulatory Visit: Payer: Self-pay | Admitting: Family Medicine

## 2020-05-27 ENCOUNTER — Other Ambulatory Visit: Payer: Self-pay | Admitting: Family Medicine

## 2020-05-29 ENCOUNTER — Other Ambulatory Visit: Payer: Self-pay | Admitting: Family Medicine

## 2020-06-12 ENCOUNTER — Other Ambulatory Visit: Payer: Self-pay | Admitting: Family Medicine

## 2020-06-20 ENCOUNTER — Other Ambulatory Visit: Payer: Self-pay | Admitting: Family Medicine

## 2020-06-20 DIAGNOSIS — E785 Hyperlipidemia, unspecified: Secondary | ICD-10-CM

## 2020-06-20 DIAGNOSIS — E1142 Type 2 diabetes mellitus with diabetic polyneuropathy: Secondary | ICD-10-CM

## 2020-06-25 ENCOUNTER — Encounter: Payer: Self-pay | Admitting: Family Medicine

## 2020-06-25 ENCOUNTER — Ambulatory Visit (INDEPENDENT_AMBULATORY_CARE_PROVIDER_SITE_OTHER): Payer: Medicare HMO | Admitting: Family Medicine

## 2020-06-25 ENCOUNTER — Other Ambulatory Visit: Payer: Self-pay

## 2020-06-25 VITALS — BP 130/61 | HR 78 | Ht 66.0 in | Wt 162.0 lb

## 2020-06-25 DIAGNOSIS — M48062 Spinal stenosis, lumbar region with neurogenic claudication: Secondary | ICD-10-CM | POA: Diagnosis not present

## 2020-06-25 DIAGNOSIS — I700 Atherosclerosis of aorta: Secondary | ICD-10-CM

## 2020-06-25 DIAGNOSIS — M79642 Pain in left hand: Secondary | ICD-10-CM

## 2020-06-25 DIAGNOSIS — E1142 Type 2 diabetes mellitus with diabetic polyneuropathy: Secondary | ICD-10-CM | POA: Diagnosis not present

## 2020-06-25 DIAGNOSIS — I10 Essential (primary) hypertension: Secondary | ICD-10-CM

## 2020-06-25 LAB — POCT GLYCOSYLATED HEMOGLOBIN (HGB A1C): Hemoglobin A1C: 8.1 % — AB (ref 4.0–5.6)

## 2020-06-25 MED ORDER — TOUJEO SOLOSTAR 300 UNIT/ML ~~LOC~~ SOPN: PEN_INJECTOR | SUBCUTANEOUS | 2 refills | Status: DC

## 2020-06-25 MED ORDER — AMITRIPTYLINE HCL 75 MG PO TABS: 75.0000 mg | ORAL_TABLET | Freq: Every evening | ORAL | 1 refills | Status: DC | PRN

## 2020-06-25 MED ORDER — TIZANIDINE HCL 6 MG PO CAPS: 6.0000 mg | ORAL_CAPSULE | Freq: Two times a day (BID) | ORAL | 2 refills | Status: DC | PRN

## 2020-06-25 MED ORDER — PREGABALIN 100 MG PO CAPS: ORAL_CAPSULE | ORAL | 3 refills | Status: DC

## 2020-06-25 NOTE — Progress Notes (Signed)
Your patient 

## 2020-06-25 NOTE — Assessment & Plan Note (Signed)
A1c jumped up significantly to 8.1 today.  We had actually dropped his insulin down from 46 units down to 20 because he was having multiple hypoglycemic episodes.  We will increase insulin back to 25 units and have him try that for a few weeks to see if he is tolerating it well.

## 2020-06-25 NOTE — Progress Notes (Signed)
Established Patient Office Visit  Subjective:  Patient ID: Justin Goodman, male    DOB: [birth year]  Age: 65 y.o. MRN: [id number]  CC:  Chief Complaint  Patient presents with  . Diabetes  . Hypertension    HPI Justin Goodman presents for   Diabetes - no hypoglycemic events. No wounds or sores that are not healing well. No increased thirst or urination. Checking glucose at home. Taking medications as prescribed without any side effects.  Hypertension- Pt denies chest pain, SOB, dizziness, or heart palpitations.  Taking meds as directed w/o problems.  Denies medication side effects.    He also reports falling about a week and a half ago and landed on his left hand.  He says it is felt swollen ever since then he has a pretty big scab on his middle finger on the PIP knuckle.  He has not noticed any drainage from the wound but it still very red and swollen and tender.  He is able to flex and extend without significant difficulty.  He also has pain on the dorsum of the hand near his wrist as well.  For his back pain and neuropathy he has been taking the Lyrica only at bedtime because if he took it during the day he says it would cause him to have blurry vision.  He still has some old gabapentin so he has been taking that in the morning and at lunch and then the Lyrica at bedtime.  He would also like his Flexeril increased to 8 mg if possible.  Past Medical History:  Diagnosis Date  . Chronic back pain    only mild loss of T12-L1 disk ht on xray 1-12  . COPD (chronic obstructive pulmonary disease) ([place])   . Diabetes mellitus    w/ neuropathy  . Hypertension   . Smoker     Past Surgical History:  Procedure Laterality Date  . LUMBAR LAMINECTOMY/DECOMPRESSION MICRODISCECTOMY N/A 09/25/2017   Procedure: LAMINECTOMY LUMBAR FOUR- LUMBAR FIVE;  Surgeon: Ashok Pall, MD;  Location: [facility];  Service: Neurosurgery;  Laterality: N/A;  . UVULOPALATOPHARYNGOPLASTY     2001    Family History   Problem Relation Age of Onset  . Diabetes Mother   . Alcohol abuse Father        Liver disease killed father @ age 33.  Marland Kitchen Alcohol abuse Brother   . Heart attack Brother        Died at age 55.    Social History   Socioeconomic History  . Marital status: Single    Spouse name: Not on file  . Number of children: Not on file  . Years of education: Not on file  . Highest education level: Not on file  Occupational History  . Not on file  Tobacco Use  . Smoking status: Former Smoker    Packs/day: 1.00    Years: 20.00    Pack years: 20.00    Types: Cigarettes    Quit date: 2013    Years since quitting: 9.2  . Smokeless tobacco: Never Used  Vaping Use  . Vaping Use: Never used  Substance and Sexual Activity  . Alcohol use: No  . Drug use: No  . Sexual activity: Yes  Other Topics Concern  . Not on file  Social History Narrative  . Not on file   Social Determinants of Health   Financial Resource Strain: Not on file  Food Insecurity: Not on file  Transportation Needs: Not on file  Physical  Activity: Not on file  Stress: Not on file  Social Connections: Not on file  Intimate Partner Violence: Not on file    Outpatient Medications Prior to Visit  Medication Sig Dispense Refill  . ACCU-CHEK GUIDE test strip USE TO CHECK BLOOD SUGAR UP TO 4 TIMES A DAY AS DIRECTED 300 strip 6  . Alcohol Swabs (B-D SINGLE USE SWABS REGULAR) PADS Check blood glucose 4 times daily Diagnosis diabetes E11.40 600 each 3  . blood glucose meter kit and supplies Dispense based on patient and insurance preference. Use up to four times daily as directed. Dx: E11.9 1 each 0  . Insulin Pen Needle (B-D ULTRAFINE III SHORT PEN) 31G X 8 MM MISC USE FOR DAILY INSULIN INJECTION AS DIRECTED BY PHYSICIAN 100 each 4  . losartan (COZAAR) 25 MG tablet Take 1 tablet (25 mg total) by mouth daily. 90 tablet 0  . meloxicam (MOBIC) 7.5 MG tablet Take 1 tablet by mouth 2 (two) times daily.    . metFORMIN (GLUCOPHAGE)  1000 MG tablet Take 1 tablet (1,000 mg total) by mouth 2 (two) times daily with a meal. 180 tablet 3  . metoprolol tartrate (LOPRESSOR) 50 MG tablet TAKE 1/2 TABLET BY MOUTH TWICE A DAY 90 tablet 1  . omeprazole (PRILOSEC) 40 MG capsule TAKE 1 CAPSULE BY MOUTH EVERY DAY 90 capsule 4  . pravastatin (PRAVACHOL) 20 MG tablet TAKE 1 TABLET BY MOUTH TWICE PER WEEK AT BEDTIME. 24 tablet 3  . traZODone (DESYREL) 150 MG tablet Take 150 mg by mouth at bedtime.    Marland Kitchen amitriptyline (ELAVIL) 50 MG tablet TAKE 1 TABLET BY MOUTH EVERYDAY AT BEDTIME 90 tablet 1  . pregabalin (LYRICA) 200 MG capsule TAKE 1 CAPSULE BY MOUTH TWICE A DAY 60 capsule 2  . tiZANidine (ZANAFLEX) 4 MG tablet TAKE 1 TABLET BY MOUTH TWICE A DAY AS NEEDED FOR MUSCLE SPASMS 180 tablet 0  . TOUJEO SOLOSTAR 300 UNIT/ML Solostar Pen INJECT 20 UNITS SUBCUTANEOUSLY ONCE DAILY 4.5 mL 2   No facility-administered medications prior to visit.    Allergies  Allergen Reactions  . Amaryl [Glimepiride] Other (See Comments)    Frequent hypoglycemia  . Atorvastatin Other (See Comments)    HA   . Cymbalta [Duloxetine Hcl] Other (See Comments)    Dizziness, insomnia  . Glipizide Other (See Comments)    HA  . Losartan Other (See Comments)    Vision changes, HA.   Marland Kitchen Lyrica [Pregabalin] Other (See Comments)    Sedation  . Quinapril Cough  . Simvastatin Other (See Comments)    Severe myalgias.      ROS Review of Systems    Objective:    Physical Exam Constitutional:      Appearance: He is well-developed.  HENT:     Head: Normocephalic and atraumatic.  Cardiovascular:     Rate and Rhythm: Normal rate and regular rhythm.     Heart sounds: Normal heart sounds.  Pulmonary:     Effort: Pulmonary effort is normal.     Breath sounds: Normal breath sounds.  Musculoskeletal:     Comments: Some swelling and erythema around the PIP joint of the middle finger on the left hand.  There is a large scab approximately a centimeter in size over that  area as well he also has some swelling over the dorsum of the hand no though no bruising in that area.  Skin:    General: Skin is warm and dry.  Neurological:  Mental Status: He is alert and oriented to person, place, and time.  Psychiatric:        Behavior: Behavior normal.     BP 130/61   Pulse 78   Ht $R'5\' 6"'PZ$  (1.676 m)   Wt 162 lb (73.5 kg)   SpO2 100%   BMI 26.15 kg/m  Wt Readings from Last 3 Encounters:  06/25/20 162 lb (73.5 kg)  03/27/20 163 lb (73.9 kg)  01/10/20 155 lb (70.3 kg)     Health Maintenance Due  Topic Date Due  . COLON CANCER SCREENING ANNUAL FOBT  08/13/2017  . PNA vac Low Risk Adult (1 of 2 - PCV13) 08/29/2018  . FOOT EXAM  12/22/2019  . COVID-19 Vaccine (3 - Booster for Moderna series) 02/22/2020    There are no preventive care reminders to display for this patient.  Lab Results  Component Value Date   TSH 2.11 08/12/2016   Lab Results  Component Value Date   WBC 3.3 (L) 03/28/2020   HGB 14.0 03/28/2020   HCT 41.1 03/28/2020   MCV 86.9 03/28/2020   PLT 169 03/28/2020   Lab Results  Component Value Date   NA 138 03/28/2020   K 4.9 03/28/2020   CO2 30 03/28/2020   GLUCOSE 203 (H) 03/28/2020   BUN 24 03/28/2020   CREATININE 0.78 03/28/2020   BILITOT 0.4 03/28/2020   ALKPHOS 51 08/12/2016   AST 15 03/28/2020   ALT 14 03/28/2020   PROT 7.7 03/28/2020   ALBUMIN 4.5 08/12/2016   CALCIUM 9.8 03/28/2020   ANIONGAP 5 09/18/2017   Lab Results  Component Value Date   CHOL 157 06/27/2019   Lab Results  Component Value Date   HDL 32 (L) 06/27/2019   Lab Results  Component Value Date   LDLCALC 108 (H) 06/27/2019   Lab Results  Component Value Date   TRIG 81 06/27/2019   Lab Results  Component Value Date   CHOLHDL 4.9 06/27/2019   Lab Results  Component Value Date   HGBA1C 8.1 (A) 06/25/2020      Assessment & Plan:   Problem List Items Addressed This Visit      Cardiovascular and Mediastinum   ESSENTIAL  HYPERTENSION, BENIGN - Primary    Well controlled. Continue current regimen.  He is actually taking losartan 50 mg based on what he brought in his back today even though we had 25 mg on his prescription.  Follow up in  3 mo       Relevant Orders   BASIC METABOLIC PANEL WITH GFR   Lipid Panel w/reflex Direct LDL   Aortic atherosclerosis (HCC)    No recent chest pain or shortness of breath.        Endocrine   Well controlled type 2 diabetes mellitus with peripheral neuropathy (HCC)    A1c jumped up significantly to 8.1 today.  We had actually dropped his insulin down from 46 units down to 20 because he was having multiple hypoglycemic episodes.  We will increase insulin back to 25 units and have him try that for a few weeks to see if he is tolerating it well.      Relevant Medications   insulin glargine, 1 Unit Dial, (TOUJEO SOLOSTAR) 300 UNIT/ML Solostar Pen   amitriptyline (ELAVIL) 75 MG tablet   tizanidine (ZANAFLEX) 6 MG capsule   pregabalin (LYRICA) 100 MG capsule   Other Relevant Orders   POCT glycosylated hemoglobin (Hb A1C) (Completed)   BASIC METABOLIC PANEL WITH  GFR   Lipid Panel w/reflex Direct LDL     Other   Lumbar stenosis with neurogenic claudication    We discussed that he cannot mix the gabapentin and the Lyrica we need to choose 1 or the other.  So we can either adjust the gabapentin and go back on that one which she was previously taking 3 times a day, or we can decrease the Lyrica during the daytime to keep it to 100 mg at night which he feels is helpful and it helps him sleep.  He opted to keep the Lyrica so we will have him do 100 mg in the morning and 200 at night and will see if this helps if he still getting blurry vision he will let us know.  Also will increase the tizanidine to 6 mg which is the maximum dose.  New prescription sent to the pharmacy.      Relevant Medications   meloxicam (MOBIC) 7.5 MG tablet   amitriptyline (ELAVIL) 75 MG tablet   tizanidine  (ZANAFLEX) 6 MG capsule   pregabalin (LYRICA) 100 MG capsule    Other Visit Diagnoses    Left hand pain       Relevant Orders   DG Hand Complete Left     Left hand pain he has significant swelling over the PIP joint of the middle finger as well as some swelling on the dorsum of the hand though there is no bruising present.  Like to get x-ray just to rule out fracture we will call with results once available otherwise recommend icing and anti-inflammatory can also call in some mupirocin ointment for the wound on his finger.  Meds ordered this encounter  Medications  . insulin glargine, 1 Unit Dial, (TOUJEO SOLOSTAR) 300 UNIT/ML Solostar Pen    Sig: INJECT 25 UNITS SUBCUTANEOUSLY ONCE DAILY    Dispense:  4.5 mL    Refill:  2  . amitriptyline (ELAVIL) 75 MG tablet    Sig: Take 1 tablet (75 mg total) by mouth at bedtime as needed for sleep.    Dispense:  90 tablet    Refill:  1  . tizanidine (ZANAFLEX) 6 MG capsule    Sig: Take 1 capsule (6 mg total) by mouth 2 (two) times daily as needed for muscle spasms.    Dispense:  60 capsule    Refill:  2  . pregabalin (LYRICA) 100 MG capsule    Sig: 1 cap PO in the AM and 2 caps at bedtime.    Dispense:  90 capsule    Refill:  3    Not to exceed 5 additional fills before 09/23/2020.    Follow-up: No follow-ups on file.      Beatrice Lecher, MD

## 2020-06-25 NOTE — Assessment & Plan Note (Signed)
No recent chest pain or shortness of breath. 

## 2020-06-25 NOTE — Assessment & Plan Note (Addendum)
Well controlled. Continue current regimen.  He is actually taking losartan 50 mg based on what he brought in his back today even though we had 25 mg on his prescription.  Follow up in  3 mo

## 2020-06-25 NOTE — Assessment & Plan Note (Signed)
We discussed that he cannot mix the gabapentin and the Lyrica we need to choose 1 or the other.  So we can either adjust the gabapentin and go back on that one which she was previously taking 3 times a day, or we can decrease the Lyrica during the daytime to keep it to 100 mg at night which he feels is helpful and it helps him sleep.  He opted to keep the Lyrica so we will have him do 100 mg in the morning and 200 at night and will see if this helps if he still getting blurry vision he will let us know.  Also will increase the tizanidine to 6 mg which is the maximum dose.  New prescription sent to the pharmacy.

## 2020-06-26 ENCOUNTER — Ambulatory Visit (INDEPENDENT_AMBULATORY_CARE_PROVIDER_SITE_OTHER): Payer: Medicare HMO

## 2020-06-26 DIAGNOSIS — E1142 Type 2 diabetes mellitus with diabetic polyneuropathy: Secondary | ICD-10-CM | POA: Diagnosis not present

## 2020-06-26 DIAGNOSIS — I10 Essential (primary) hypertension: Secondary | ICD-10-CM | POA: Diagnosis not present

## 2020-06-26 DIAGNOSIS — M79642 Pain in left hand: Secondary | ICD-10-CM

## 2020-06-26 LAB — BASIC METABOLIC PANEL WITH GFR
BUN: 20 mg/dL (ref 7–25)
CO2: 29 mmol/L (ref 20–32)
Calcium: 9.5 mg/dL (ref 8.6–10.3)
Chloride: 101 mmol/L (ref 98–110)
Creat: 0.73 mg/dL (ref 0.70–1.25)
GFR, Est African American: 112 mL/min/{1.73_m2} (ref >=60)
GFR, Est Non African American: 97 mL/min/{1.73_m2} (ref >=60)
Glucose, Bld: 269 mg/dL — ABNORMAL HIGH (ref 65–99)
Potassium: 4.4 mmol/L (ref 3.5–5.3)
Sodium: 138 mmol/L (ref 135–146)

## 2020-06-26 LAB — LIPID PANEL W/REFLEX DIRECT LDL
Cholesterol: 152 mg/dL (ref <200)
HDL: 32 mg/dL — ABNORMAL LOW (ref >=40)
LDL Cholesterol (Calc): 95 mg/dL (calc)
Non-HDL Cholesterol (Calc): 120 mg/dL (calc) (ref <130)
Total CHOL/HDL Ratio: 4.8 (calc) (ref <5.0)
Triglycerides: 156 mg/dL — ABNORMAL HIGH (ref <150)

## 2020-07-01 ENCOUNTER — Other Ambulatory Visit: Payer: Self-pay | Admitting: Family Medicine

## 2020-07-10 ENCOUNTER — Other Ambulatory Visit: Payer: Self-pay | Admitting: Physician Assistant

## 2020-08-03 DIAGNOSIS — I10 Essential (primary) hypertension: Secondary | ICD-10-CM | POA: Diagnosis not present

## 2020-08-03 DIAGNOSIS — E113293 Type 2 diabetes mellitus with mild nonproliferative diabetic retinopathy without macular edema, bilateral: Secondary | ICD-10-CM | POA: Diagnosis not present

## 2020-08-03 DIAGNOSIS — H524 Presbyopia: Secondary | ICD-10-CM | POA: Diagnosis not present

## 2020-08-03 DIAGNOSIS — E7800 Pure hypercholesterolemia, unspecified: Secondary | ICD-10-CM | POA: Diagnosis not present

## 2020-08-03 LAB — HM DIABETES EYE EXAM

## 2020-08-06 DIAGNOSIS — Z0100 Encounter for examination of eyes and vision without abnormal findings: Secondary | ICD-10-CM | POA: Diagnosis not present

## 2020-08-07 ENCOUNTER — Other Ambulatory Visit: Payer: Self-pay | Admitting: *Deleted

## 2020-08-07 ENCOUNTER — Telehealth: Payer: Self-pay | Admitting: *Deleted

## 2020-08-07 MED ORDER — TIZANIDINE HCL 6 MG PO CAPS: 6.0000 mg | ORAL_CAPSULE | Freq: Two times a day (BID) | ORAL | 2 refills | Status: DC | PRN

## 2020-08-07 MED ORDER — LOSARTAN POTASSIUM 50 MG PO TABS: 1.0000 | ORAL_TABLET | Freq: Every day | ORAL | 0 refills | Status: DC

## 2020-08-07 MED ORDER — OMEPRAZOLE 40 MG PO CPDR: DELAYED_RELEASE_CAPSULE | ORAL | 3 refills | Status: DC

## 2020-08-07 MED ORDER — BD SWAB SINGLE USE REGULAR PADS: MEDICATED_PAD | 3 refills | Status: DC

## 2020-08-07 MED ORDER — METFORMIN HCL 1000 MG PO TABS: 1000.0000 mg | ORAL_TABLET | Freq: Two times a day (BID) | ORAL | 3 refills | Status: DC

## 2020-08-07 NOTE — Telephone Encounter (Signed)
Refill requested for lyrica and amitriptyline requested from [facility]. Too early for refills.

## 2020-08-08 ENCOUNTER — Ambulatory Visit (INDEPENDENT_AMBULATORY_CARE_PROVIDER_SITE_OTHER): Payer: Medicare HMO | Admitting: Physician Assistant

## 2020-08-08 DIAGNOSIS — Z0000 Encounter for general adult medical examination without abnormal findings: Secondary | ICD-10-CM

## 2020-08-08 NOTE — Patient Instructions (Addendum)
MEDICARE ANNUAL WELLNESS VISIT Health Maintenance Summary and Written Plan of Care  Justin Goodman ,  Thank you for allowing me to perform your Medicare Annual Wellness Visit and for your ongoing commitment to your health.   Health Maintenance & Immunization History Health Maintenance  Topic Date Due  . COVID-19 Vaccine (3 - Booster for Moderna series) 08/24/2020 (Originally 02/22/2020)  . COLON CANCER SCREENING ANNUAL FOBT  02/08/2021 (Originally 08/13/2017)  . FOOT EXAM  08/08/2021 (Originally 12/22/2019)  . PNA vac Low Risk Adult (1 of 2 - PCV13) 08/08/2021 (Originally 08/29/2018)  . INFLUENZA VACCINE  11/05/2020  . HEMOGLOBIN A1C  12/26/2020  . COLONOSCOPY (Pts 45-38yrs Insurance coverage will need to be confirmed)  04/03/2021  . OPHTHALMOLOGY EXAM  07/25/2021  . TETANUS/TDAP  06/25/2025  . Hepatitis C Screening  Completed  . HPV VACCINES  Aged Out   Immunization History  Administered Date(s) Administered  . Fluad Quad(high Dose 65+) 12/22/2018, 01/10/2020  . Influenza, High Dose Seasonal PF 01/08/2017, 01/20/2018  . Influenza,inj,Quad PF,6+ Mos 01/21/2013, 01/10/2014, 12/28/2014, 01/10/2016  . Moderna Sars-Covid-2 Vaccination 07/25/2019, 08/22/2019  . Pneumococcal Polysaccharide-23 08/19/2012  . Tdap 06/26/2015    These are the patient goals that we discussed: Goals Addressed              This Visit's Progress   .  Patient Stated (pt-stated)        08/08/2020 AWV Goal: Exercise for General Health   Patient will verbalize understanding of the benefits of increased physical activity:  Exercising regularly is important. It will improve your overall fitness, flexibility, and endurance.  Regular exercise also will improve your overall health. It can help you control your weight, reduce stress, and improve your bone density.  Over the next year, patient will increase physical activity as tolerated with a goal of at least 150 minutes of moderate physical activity per week.    You can tell that you are exercising at a moderate intensity if your heart starts beating faster and you start breathing faster but can still hold a conversation.  Moderate-intensity exercise ideas include:  Walking 1 mile (1.6 km) in about 15 minutes  Biking  Hiking  Golfing  Dancing  Water aerobics  Patient will verbalize understanding of everyday activities that increase physical activity by providing examples like the following: ? Yard work, such as: ? Pushing a Surveyor, mining ? Raking and bagging leaves ? Washing your car ? Pushing a stroller ? Shoveling snow ? Gardening ? Washing windows or floors  Patient will be able to explain general safety guidelines for exercising:   Before you start a new exercise program, talk with your health care provider.  Do not exercise so much that you hurt yourself, feel dizzy, or get very short of breath.  Wear comfortable clothes and wear shoes with good support.  Drink plenty of water while you exercise to prevent dehydration or heat stroke.  Work out until your breathing and your heartbeat get faster.         This is a list of Health Maintenance Items that are overdue or due now: Pneumococcal vaccine  Colorectal cancer screening Foot exam, shingles vaccine  Orders/Referrals Placed Today: No orders of the defined types were placed in this encounter.  (Contact our referral department at [phone] if you have not spoken with someone about your referral appointment within the next 5 days)    Follow-up Plan . Follow-up with Agapito Games, MD as planned . Schedule your nurse  visit for your pneumonia vaccine. . Foot exam will be done at your next in office visit.  . Shingles vaccine can be completed at your pharmacy.  . Please let us know if you change your mind about the referral for a colonoscopy.  . Medicare wellness visit in one year.         Colonoscopy, Adult A colonoscopy is a procedure to look at  the entire large intestine. This procedure is done using a long, thin, flexible tube that has a camera on the end. You may have a colonoscopy:  As a part of normal colorectal screening.  If you have certain symptoms, such as: ? A low number of red blood cells in your blood (anemia). ? Diarrhea that does not go away. ? Pain in your abdomen. ? Blood in your stool. A colonoscopy can help screen for and diagnose medical problems, including:  Tumors.  Extra tissue that grows where mucus forms (polyps).  Inflammation.  Areas of bleeding. Tell your health care provider about:  Any allergies you have.  All medicines you are taking, including vitamins, herbs, eye drops, creams, and over-the-counter medicines.  Any problems you or family members have had with anesthetic medicines.  Any blood disorders you have.  Any surgeries you have had.  Any medical conditions you have.  Any problems you have had with having bowel movements.  Whether you are pregnant or may be pregnant. What are the risks? Generally, this is a safe procedure. However, problems may occur, including:  Bleeding.  Damage to your intestine.  Allergic reactions to medicines given during the procedure.  Infection. This is rare. What happens before the procedure? Eating and drinking restrictions Follow instructions from your health care provider about eating or drinking restrictions, which may include:  A few days before the procedure: ? Follow a low-fiber diet. ? Avoid nuts, seeds, dried fruit, raw fruits, and vegetables.  1-3 days before the procedure: ? Eat only gelatin dessert or ice pops. ? Drink only clear liquids, such as water, clear juice, clear broth or bouillon, black coffee or tea, or clear soft drinks or sports drinks. ? Avoid liquids that contain red or purple dye.  The day of the procedure: ? Do not eat solid foods. You may continue to drink clear liquids until up to 2 hours before the  procedure. ? Do not eat or drink anything starting 2 hours before the procedure, or within the time period that your health care provider recommends. Bowel prep If you were prescribed a bowel prep to take by mouth (orally) to clean out your colon:  Take it as told by your health care provider. Starting the day before your procedure, you will need to drink a large amount of liquid medicine. The liquid will cause you to have many bowel movements of loose stool until your stool becomes almost clear or light green.  If your skin or the opening between the buttocks (anus) gets irritated from diarrhea, you may relieve the irritation using: ? Wipes with medicine in them, such as adult wet wipes with aloe and vitamin E. ? A product to soothe skin, such as petroleum jelly.  If you vomit while drinking the bowel prep: ? Take a break for up to 60 minutes. ? Begin the bowel prep again. ? Call your health care provider if you keep vomiting or you cannot take the bowel prep without vomiting.  To clean out your colon, you may also be given: ? Laxative medicines. These  help you have a bowel movement. ? Instructions for enema use. An enema is liquid medicine injected into your rectum. Medicines Ask your health care provider about:  Changing or stopping your regular medicines or supplements. This is especially important if you are taking iron supplements, diabetes medicines, or blood thinners.  Taking medicines such as aspirin and ibuprofen. These medicines can thin your blood. Do not take these medicines unless your health care provider tells you to take them.  Taking over-the-counter medicines, vitamins, herbs, and supplements. General instructions  Ask your health care provider what steps will be taken to help prevent infection. These may include washing skin with a germ-killing soap.  Plan to have someone take you home from the hospital or clinic. What happens during the procedure?  An IV will be  inserted into one of your veins.  You may be given one or more of the following: ? A medicine to help you relax (sedative). ? A medicine to numb the area (local anesthetic). ? A medicine to make you fall asleep (general anesthetic). This is rarely needed.  You will lie on your side with your knees bent.  The tube will: ? Have oil or gel put on it (be lubricated). ? Be inserted into your anus. ? Be gently eased through all parts of your large intestine.  Air will be sent into your colon to keep it open. This may cause some pressure or cramping.  Images will be taken with the camera and will appear on a screen.  A small tissue sample may be removed to be looked at under a microscope (biopsy). The tissue may be sent to a lab for testing if any signs of problems are found.  If small polyps are found, they may be removed and checked for cancer cells.  When the procedure is finished, the tube will be removed. The procedure may vary among health care providers and hospitals.   What happens after the procedure?  Your blood pressure, heart rate, breathing rate, and blood oxygen level will be monitored until you leave the hospital or clinic.  You may have a small amount of blood in your stool.  You may pass gas and have mild cramping or bloating in your abdomen. This is caused by the air that was used to open your colon during the exam.  Do not drive for 24 hours after the procedure.  It is up to you to get the results of your procedure. Ask your health care provider, or the department that is doing the procedure, when your results will be ready. Summary  A colonoscopy is a procedure to look at the entire large intestine.  Follow instructions from your health care provider about eating and drinking before the procedure.  If you were prescribed an oral bowel prep to clean out your colon, take it as told by your health care provider.  During the colonoscopy, a flexible tube with a  camera on its end is inserted into the anus and then passed into the other parts of the large intestine. This information is not intended to replace advice given to you by your health care provider. Make sure you discuss any questions you have with your health care provider. Document Revised: 10/15/2018 Document Reviewed: 10/15/2018 Elsevier Patient Education  2021 [employer]   Health Maintenance, Male Adopting a healthy lifestyle and getting preventive care are important in promoting health and wellness. Ask your health care provider about:  The right schedule for you to  have regular tests and exams.  Things you can do on your own to prevent diseases and keep yourself healthy. What should I know about diet, weight, and exercise? Eat a healthy diet  Eat a diet that includes plenty of vegetables, fruits, low-fat dairy products, and lean protein.  Do not eat a lot of foods that are high in solid fats, added sugars, or sodium.   Maintain a healthy weight Body mass index (BMI) is a measurement that can be used to identify possible weight problems. It estimates body fat based on height and weight. Your health care provider can help determine your BMI and help you achieve or maintain a healthy weight. Get regular exercise Get regular exercise. This is one of the most important things you can do for your health. Most adults should:  Exercise for at least 150 minutes each week. The exercise should increase your heart rate and make you sweat (moderate-intensity exercise).  Do strengthening exercises at least twice a week. This is in addition to the moderate-intensity exercise.  Spend less time sitting. Even light physical activity can be beneficial. Watch cholesterol and blood lipids Have your blood tested for lipids and cholesterol at 67 years of age, then have this test every 5 years. You may need to have your cholesterol levels checked more often if:  Your lipid or cholesterol levels  are high.  You are older than 67 years of age.  You are at high risk for heart disease. What should I know about cancer screening? Many types of cancers can be detected early and may often be prevented. Depending on your health history and family history, you may need to have cancer screening at various ages. This may include screening for:  Colorectal cancer.  Prostate cancer.  Skin cancer.  Lung cancer. What should I know about heart disease, diabetes, and high blood pressure? Blood pressure and heart disease  High blood pressure causes heart disease and increases the risk of stroke. This is more likely to develop in people who have high blood pressure readings, are of African descent, or are overweight.  Talk with your health care provider about your target blood pressure readings.  Have your blood pressure checked: ? Every 3-5 years if you are 59-88 years of age. ? Every year if you are 35 years old or older.  If you are between the ages of 56 and 44 and are a current or former smoker, ask your health care provider if you should have a one-time screening for abdominal aortic aneurysm (AAA). Diabetes Have regular diabetes screenings. This checks your fasting blood sugar level. Have the screening done:  Once every three years after age 79 if you are at a normal weight and have a low risk for diabetes.  More often and at a younger age if you are overweight or have a high risk for diabetes. What should I know about preventing infection? Hepatitis B If you have a higher risk for hepatitis B, you should be screened for this virus. Talk with your health care provider to find out if you are at risk for hepatitis B infection. Hepatitis C Blood testing is recommended for:  Everyone born from [birth year] through 1967.  Anyone with known risk factors for hepatitis C. Sexually transmitted infections (STIs)  You should be screened each year for STIs, including gonorrhea and chlamydia,  if: ? You are sexually active and are younger than 67 years of age. ? You are older than 67 years of age  and your health care provider tells you that you are at risk for this type of infection. ? Your sexual activity has changed since you were last screened, and you are at increased risk for chlamydia or gonorrhea. Ask your health care provider if you are at risk.  Ask your health care provider about whether you are at high risk for HIV. Your health care provider may recommend a prescription medicine to help prevent HIV infection. If you choose to take medicine to prevent HIV, you should first get tested for HIV. You should then be tested every 3 months for as long as you are taking the medicine. Follow these instructions at home: Lifestyle  Do not use any products that contain nicotine or tobacco, such as cigarettes, e-cigarettes, and chewing tobacco. If you need help quitting, ask your health care provider.  Do not use street drugs.  Do not share needles.  Ask your health care provider for help if you need support or information about quitting drugs. Alcohol use  Do not drink alcohol if your health care provider tells you not to drink.  If you drink alcohol: ? Limit how much you have to 0-2 drinks a day. ? Be aware of how much alcohol is in your drink. In the [place] one drink equals one 12 oz bottle of beer (355 mL), one 5 oz glass of wine (148 mL), or one 1 oz glass of hard liquor (44 mL). General instructions  Schedule regular health, dental, and eye exams.  Stay current with your vaccines.  Tell your health care provider if: ? You often feel depressed. ? You have ever been abused or do not feel safe at home. Summary  Adopting a healthy lifestyle and getting preventive care are important in promoting health and wellness.  Follow your health care provider's instructions about healthy diet, exercising, and getting tested or screened for diseases.  Follow your health care  provider's instructions on monitoring your cholesterol and blood pressure. This information is not intended to replace advice given to you by your health care provider. Make sure you discuss any questions you have with your health care provider. Document Revised: 03/17/2018 Document Reviewed: 03/17/2018 Elsevier Patient Education  2021 [employer]

## 2020-08-08 NOTE — Progress Notes (Signed)
[place] VISIT  08/08/2020  Telephone Visit Disclaimer This Medicare AWV was conducted by telephone due to national recommendations for restrictions regarding the COVID-19 Pandemic (e.g. social distancing).  I verified, using two identifiers, that I am speaking with Justin Goodman or their authorized healthcare agent. I discussed the limitations, risks, security, and privacy concerns of performing an evaluation and management service by telephone and the potential availability of an in-person appointment in the future. The patient expressed understanding and agreed to proceed.  Location of Patient: Home Location of Provider (nurse):  In the office.  Subjective:    Justin Goodman is a 65 y.o. male patient of Metheney, Rene Kocher, MD who had a Medicare Annual Wellness Visit today via telephone. Justin Goodman is Retired and lives alone. he has 0 children. he reports that he is not socially active and does not interact with friends/family regularly. he is minimally physically active and enjoys watching tv..  Patient Care Team: Hali Marry, MD as PCP - General (Family Medicine)  Advanced Directives 08/08/2020 09/25/2017 09/18/2017 05/20/2016 09/02/2013 07/12/2013  Does Patient Have a Medical Advance Directive? No No No Yes Patient has advance directive, copy not in chart Patient does not have advance directive;Patient would like information  Type of Advance Directive - - - Healthcare Power of [place] -  Does patient want to make changes to medical advance directive? - - - No - Patient declined - -  Copy of [place] in Chart? - - - No - copy requested - -  Would patient like information on creating a medical advance directive? No - Patient declined - No - Patient declined - - Advance directive packet given    Hospital Utilization Over the Past 12 Months: # of hospitalizations or ER visits: 0 # of surgeries: 0  Review of  Systems    Patient reports that his overall health is better compared to last year.  History obtained from chart review and the patient  Patient Reported Readings (BP, Pulse, CBG, Weight, etc) none  Pain Assessment Pain : 0-10 Pain Score: 8  Pain Type: Chronic pain Pain Location: Foot Pain Orientation: Left,Right Pain Descriptors / Indicators: Constant Pain Onset: More than a month ago Pain Frequency: Constant Pain Relieving Factors: lyrica but he feels its still not helping  Pain Relieving Factors: lyrica but he feels its still not helping  Current Medications & Allergies (verified) Allergies as of 08/08/2020      Reactions   Amaryl [glimepiride] Other (See Comments)   Frequent hypoglycemia   Atorvastatin Other (See Comments)   HA    Cymbalta [duloxetine Hcl] Other (See Comments)   Dizziness, insomnia   Glipizide Other (See Comments)   HA   Losartan Other (See Comments)   Vision changes, HA.    Lyrica [pregabalin] Other (See Comments)   Sedation   Quinapril Cough   Simvastatin Other (See Comments)   Severe myalgias.        Medication List       Accurate as of Aug 08, 2020 10:35 AM. If you have any questions, ask your nurse or doctor.        Accu-Chek Guide test strip Generic drug: glucose blood USE TO CHECK BLOOD SUGAR UP TO 4 TIMES A DAY AS DIRECTED   amitriptyline 75 MG tablet Commonly known as: ELAVIL Take 1 tablet (75 mg total) by mouth at bedtime as needed for sleep.   B-D SINGLE USE SWABS REGULAR Pads  Check blood glucose 4 times daily Diagnosis diabetes E11.40   B-D ULTRAFINE III SHORT PEN 31G X 8 MM Misc Generic drug: Insulin Pen Needle USE FOR DAILY INSULIN INJECTION AS DIRECTED BY PHYSICIAN   blood glucose meter kit and supplies Dispense based on patient and insurance preference. Use up to four times daily as directed. Dx: E11.9   losartan 50 MG tablet Commonly known as: COZAAR Take 1 tablet (50 mg total) by mouth daily.   meloxicam 7.5 MG  tablet Commonly known as: MOBIC Take 1 tablet by mouth 2 (two) times daily.   metFORMIN 1000 MG tablet Commonly known as: GLUCOPHAGE Take 1 tablet (1,000 mg total) by mouth 2 (two) times daily with a meal.   metoprolol tartrate 50 MG tablet Commonly known as: LOPRESSOR TAKE 1/2 TABLET BY MOUTH TWICE A DAY   omeprazole 40 MG capsule Commonly known as: PRILOSEC TAKE 1 CAPSULE BY MOUTH EVERY DAY   pravastatin 20 MG tablet Commonly known as: PRAVACHOL TAKE 1 TABLET BY MOUTH TWICE PER WEEK AT BEDTIME.   pregabalin 100 MG capsule Commonly known as: LYRICA 1 cap PO in the AM and 2 caps at bedtime.   tizanidine 6 MG capsule Commonly known as: ZANAFLEX Take 1 capsule (6 mg total) by mouth 2 (two) times daily as needed for muscle spasms.   Toujeo SoloStar 300 UNIT/ML Solostar Pen Generic drug: insulin glargine (1 Unit Dial) INJECT 25 UNITS SUBCUTANEOUSLY ONCE DAILY   traZODone 150 MG tablet Commonly known as: DESYREL TAKE 1 TABLET BY MOUTH EVERYDAY AT BEDTIME       History (reviewed): Past Medical History:  Diagnosis Date  . Chronic back pain    only mild loss of T12-L1 disk ht on xray 1-12  . COPD (chronic obstructive pulmonary disease) ([place])   . Diabetes mellitus    w/ neuropathy  . Hypertension   . Smoker    Past Surgical History:  Procedure Laterality Date  . LUMBAR LAMINECTOMY/DECOMPRESSION MICRODISCECTOMY N/A 09/25/2017   Procedure: LAMINECTOMY LUMBAR FOUR- LUMBAR FIVE;  Surgeon: Ashok Pall, MD;  Location: [facility];  Service: Neurosurgery;  Laterality: N/A;  . UVULOPALATOPHARYNGOPLASTY     2001   Family History  Problem Relation Age of Onset  . Diabetes Mother   . Alcohol abuse Father        Liver disease killed father @ age 44.  Marland Kitchen Alcohol abuse Brother   . Heart attack Brother        Died at age 64.   Social History   Socioeconomic History  . Marital status: Single    Spouse name: Not on file  . Number of children: 0  . Years of education: 12th  grade  . Highest education level: High school graduate  Occupational History    Comment: Retired  Tobacco Use  . Smoking status: Former Smoker    Packs/day: 1.00    Years: 20.00    Pack years: 20.00    Types: Cigarettes    Quit date: 2013    Years since quitting: 9.3  . Smokeless tobacco: Never Used  Vaping Use  . Vaping Use: Never used  Substance and Sexual Activity  . Alcohol use: No  . Drug use: No  . Sexual activity: Yes  Other Topics Concern  . Not on file  Social History Narrative   Lives alone. Due to his neuropathy, he is not able to do much exercise. He enjoys watching t.v. in his free time.   Social Determinants of Health  Financial Resource Strain: Low Risk   . Difficulty of Paying Living Expenses: Not hard at all  Food Insecurity: No Food Insecurity  . Worried About Charity fundraiser in the Last Year: Never true  . Ran Out of Food in the Last Year: Never true  Transportation Needs: No Transportation Needs  . Lack of Transportation (Medical): No  . Lack of Transportation (Non-Medical): No  Physical Activity: Inactive  . Days of Exercise per Week: 0 days  . Minutes of Exercise per Session: 0 min  Stress: No Stress Concern Present  . Feeling of Stress : Not at all  Social Connections: Socially Isolated  . Frequency of Communication with Friends and Family: Once a week  . Frequency of Social Gatherings with Friends and Family: Never  . Attends Religious Services: More than 4 times per year  . Active Member of Clubs or Organizations: No  . Attends Archivist Meetings: Never  . Marital Status: Divorced    Activities of Daily Living In your present state of health, do you have any difficulty performing the following activities: 08/08/2020  Hearing? N  Vision? Y  Comment he states he has some difficulty  Difficulty concentrating or making decisions? Y  Comment has difficulty remembering  Walking or climbing stairs? Y  Comment due to neuropathy in  both of his feet  Dressing or bathing? N  Doing errands, shopping? N  Preparing Food and eating ? N  Using the Toilet? N  In the past six months, have you accidently leaked urine? N  Do you have problems with loss of bowel control? N  Managing your Medications? N  Managing your Finances? N  Housekeeping or managing your Housekeeping? Y  Comment due to his neuropathy in both of his feet.  Some recent data might be hidden    Patient Education/ Literacy How often do you need to have someone help you when you read instructions, pamphlets, or other written materials from your doctor or pharmacy?: 1 - Never What is the last grade level you completed in school?: 12th grade.  Exercise Current Exercise Habits: The patient does not participate in regular exercise at present, Exercise limited by: neurologic condition(s) (neuropathy in both of his feet.)  Diet Patient reports consuming 2 meals a day and 0 snack(s) a day Patient reports that his primary diet is: Regular Patient reports that she does have regular access to food.   Depression Screen PHQ 2/9 Scores 08/08/2020 03/27/2020 12/22/2018 03/16/2018 07/30/2017 01/08/2017 05/20/2016  PHQ - 2 Score 0 0 0 0 3 0 0  PHQ- 9 Score - - - - 12 - -     Fall Risk Fall Risk  08/08/2020 03/27/2020 09/26/2019 12/22/2018 07/30/2017  Falls in the past year? 0 1 1 0 Yes  Number falls in past yr: 0 0 1 0 1  Injury with Fall? 0 1 1 0 Yes  Comment - - - - -  Risk for fall due to : No Fall Risks Impaired mobility Other (Comment) - Impaired balance/gait  Risk for fall due to: Comment - - low blood sugars - -  Follow up Falls evaluation completed - Falls prevention discussed - Falls prevention discussed     Objective:  Justin Goodman seemed alert and oriented and he participated appropriately during our telephone visit.  Blood Pressure Weight BMI  BP Readings from Last 3 Encounters:  06/25/20 130/61  03/27/20 (!) 132/58  01/10/20 100/63   Wt Readings from  Last 3  Encounters:  06/25/20 162 lb (73.5 kg)  03/27/20 163 lb (73.9 kg)  01/10/20 155 lb (70.3 kg)   BMI Readings from Last 1 Encounters:  06/25/20 26.15 kg/m    *Unable to obtain current vital signs, weight, and BMI due to telephone visit type  Hearing/Vision  . Justin Goodman did not seem to have difficulty with hearing/understanding during the telephone conversation . Reports that he has had a formal eye exam by an eye care professional within the past year . Reports that he has not had a formal hearing evaluation within the past year *Unable to fully assess hearing and vision during telephone visit type  Cognitive Function: 6CIT Screen 08/08/2020 05/20/2016  What Year? 0 points 0 points  What month? 0 points 0 points  What time? 0 points 0 points  Count back from 20 0 points 0 points  Months in reverse 0 points 0 points  Repeat phrase 2 points 0 points  Total Score 2 0   (Normal:0-7, Significant for Dysfunction: >8)  Normal Cognitive Function Screening: Yes   Immunization & Health Maintenance Record Immunization History  Administered Date(s) Administered  . Fluad Quad(high Dose 65+) 12/22/2018, 01/10/2020  . Influenza, High Dose Seasonal PF 01/08/2017, 01/20/2018  . Influenza,inj,Quad PF,6+ Mos 01/21/2013, 01/10/2014, 12/28/2014, 01/10/2016  . Moderna Sars-Covid-2 Vaccination 07/25/2019, 08/22/2019  . Pneumococcal Polysaccharide-23 08/19/2012  . Tdap 06/26/2015    Health Maintenance  Topic Date Due  . COVID-19 Vaccine (3 - Booster for Moderna series) 08/24/2020 (Originally 02/22/2020)  . COLON CANCER SCREENING ANNUAL FOBT  02/08/2021 (Originally 08/13/2017)  . FOOT EXAM  08/08/2021 (Originally 12/22/2019)  . PNA vac Low Risk Adult (1 of 2 - PCV13) 08/08/2021 (Originally 08/29/2018)  . INFLUENZA VACCINE  11/05/2020  . HEMOGLOBIN A1C  12/26/2020  . COLONOSCOPY (Pts 45-15yr Insurance coverage will need to be confirmed)  04/03/2021  . OPHTHALMOLOGY EXAM  07/25/2021  .  TETANUS/TDAP  06/25/2025  . Hepatitis C Screening  Completed  . HPV VACCINES  Aged Out       Assessment  This is a routine wellness examination for Justin Goodman  Health Maintenance: Due or Overdue There are no preventive care reminders to display for this patient.  Justin Pricedoes not need a referral for Community Assistance: Care Management:   no Social Work:    no Prescription Assistance:  no Nutrition/Diabetes Education:  no   Plan:  Personalized Goals Goals Addressed              This Visit's Progress   .  Patient Stated (pt-stated)        08/08/2020 AWV Goal: Exercise for General Health   Patient will verbalize understanding of the benefits of increased physical activity:  Exercising regularly is important. It will improve your overall fitness, flexibility, and endurance.  Regular exercise also will improve your overall health. It can help you control your weight, reduce stress, and improve your bone density.  Over the next year, patient will increase physical activity as tolerated with a goal of at least 150 minutes of moderate physical activity per week.   You can tell that you are exercising at a moderate intensity if your heart starts beating faster and you start breathing faster but can still hold a conversation.  Moderate-intensity exercise ideas include:  Walking 1 mile (1.6 km) in about 15 minutes  Biking  Hiking  Golfing  Dancing  Water aerobics  Patient will verbalize understanding of everyday activities that increase physical activity by providing examples like  the following: ? Yard work, such as: ? Pushing a Conservation officer, nature ? Raking and bagging leaves ? Washing your car ? Pushing a stroller ? Shoveling snow ? Gardening ? Washing windows or floors  Patient will be able to explain general safety guidelines for exercising:   Before you start a new exercise program, talk with your health care provider.  Do not exercise so much that  you hurt yourself, feel dizzy, or get very short of breath.  Wear comfortable clothes and wear shoes with good support.  Drink plenty of water while you exercise to prevent dehydration or heat stroke.  Work out until your breathing and your heartbeat get faster.       Personalized Health Maintenance & Screening Recommendations  Pneumococcal vaccine  Colorectal cancer screening Foot exam, shingles vaccine  Patient declined the colorectal cancer screening.   Lung Cancer Screening Recommended: no (Low Dose CT Chest recommended if Age 80-80 years, 30 pack-year currently smoking OR have quit w/in past 15 years) Hepatitis C Screening recommended: no HIV Screening recommended: yes  Advanced Directives: Written information was not prepared per patient's request.  Referrals & Orders No orders of the defined types were placed in this encounter.   Follow-up Plan . Follow-up with Hali Marry, MD as planned . Schedule your nurse visit for your pneumonia vaccine. . Foot exam will be done at your next in office visit.  . Shingles vaccine can be completed at your pharmacy.  . Please let us know if you change your mind about the referral for a colonoscopy.  . Medicare wellness visit in one year.   I have personally reviewed and noted the following in the patient's chart:   . Medical and social history . Use of alcohol, tobacco or illicit drugs  . Current medications and supplements . Functional ability and status . Nutritional status . Physical activity . Advanced directives . List of other physicians . Hospitalizations, surgeries, and ER visits in previous 12 months . Vitals . Screenings to include cognitive, depression, and falls . Referrals and appointments  In addition, I have reviewed and discussed with Justin Goodman certain preventive protocols, quality metrics, and best practice recommendations. A written personalized care plan for preventive services as well as  general preventive health recommendations is available and can be mailed to the patient at his request.      Tinnie Gens, RN  08/08/2020

## 2020-08-23 ENCOUNTER — Other Ambulatory Visit: Payer: Self-pay | Admitting: Family Medicine

## 2020-08-25 ENCOUNTER — Other Ambulatory Visit: Payer: Self-pay | Admitting: Family Medicine

## 2020-09-07 ENCOUNTER — Other Ambulatory Visit: Payer: Self-pay | Admitting: *Deleted

## 2020-09-07 DIAGNOSIS — E1142 Type 2 diabetes mellitus with diabetic polyneuropathy: Secondary | ICD-10-CM

## 2020-09-07 MED ORDER — TOUJEO SOLOSTAR 300 UNIT/ML ~~LOC~~ SOPN: PEN_INJECTOR | SUBCUTANEOUS | 2 refills | Status: DC

## 2020-09-11 ENCOUNTER — Encounter: Payer: Self-pay | Admitting: Family Medicine

## 2020-09-21 ENCOUNTER — Other Ambulatory Visit: Payer: Self-pay | Admitting: *Deleted

## 2020-09-21 DIAGNOSIS — E785 Hyperlipidemia, unspecified: Secondary | ICD-10-CM

## 2020-09-21 DIAGNOSIS — E1142 Type 2 diabetes mellitus with diabetic polyneuropathy: Secondary | ICD-10-CM

## 2020-09-21 MED ORDER — PRAVASTATIN SODIUM 20 MG PO TABS: ORAL_TABLET | ORAL | 3 refills | Status: DC

## 2020-10-01 ENCOUNTER — Encounter: Payer: Self-pay | Admitting: Family Medicine

## 2020-10-01 ENCOUNTER — Other Ambulatory Visit: Payer: Self-pay

## 2020-10-01 ENCOUNTER — Ambulatory Visit (INDEPENDENT_AMBULATORY_CARE_PROVIDER_SITE_OTHER): Payer: Medicare HMO | Admitting: Family Medicine

## 2020-10-01 VITALS — BP 105/57 | HR 86 | Ht 66.0 in | Wt 159.0 lb

## 2020-10-01 DIAGNOSIS — M48062 Spinal stenosis, lumbar region with neurogenic claudication: Secondary | ICD-10-CM

## 2020-10-01 DIAGNOSIS — E1142 Type 2 diabetes mellitus with diabetic polyneuropathy: Secondary | ICD-10-CM | POA: Diagnosis not present

## 2020-10-01 DIAGNOSIS — I10 Essential (primary) hypertension: Secondary | ICD-10-CM

## 2020-10-01 DIAGNOSIS — H6123 Impacted cerumen, bilateral: Secondary | ICD-10-CM

## 2020-10-01 LAB — POCT GLYCOSYLATED HEMOGLOBIN (HGB A1C): Hemoglobin A1C: 7.4 % — AB (ref 4.0–5.6)

## 2020-10-01 MED ORDER — GABAPENTIN 600 MG PO TABS: 1200.0000 mg | ORAL_TABLET | Freq: Three times a day (TID) | ORAL | 0 refills | Status: DC

## 2020-10-01 NOTE — Patient Instructions (Addendum)
Please cut your losartan in half and just take half a tab daily since your blood pressure is a little bit low. Stop the lyrica.   Stop the zolpidem

## 2020-10-01 NOTE — Assessment & Plan Note (Signed)
And alsoEP is actually little low today.  We will cut losartan in half and have him come back for nurse visit in 2 weeks to recheck.  Adjust the metoprolol if needed.

## 2020-10-01 NOTE — Assessment & Plan Note (Signed)
We will switch back to gabapentin.  He feels the Lyrica is too sedating.  Continue with the amitriptyline at bedtime as well.

## 2020-10-01 NOTE — Progress Notes (Signed)
Established Patient Office Visit  Subjective:  Patient ID: Justin Goodman, male    DOB: Feb 17, 1956  Age: 65 y.o. MRN: [id number]  CC: No chief complaint on file.   HPI Hiram Mciver presents for   Diabetes - no hypoglycemic events.  Using 26 units of Toujeo daily.  No wounds or sores that are not healing well. No increased thirst or urination. Checking glucose at home. Taking medications as prescribed without any side effects.  Follow-up for peripheral neuropathy-he wanted to get a refill on his gabapentin but per his medication list he is currently on Lyrica he was transitioned off of the gabapentin onto Lyrica about 6 months ago by the podiatrist.  He supposed to be taking 100 mg of Lyrica in the morning and 200 mg at bedtime he says the Lyrica makes him feel sleepy.  Hypertension- Pt denies chest pain, SOB, dizziness, or heart palpitations.  Taking meds as directed w/o problems.  Denies medication side effects.     F/U insomnia - he has been taking old Ambien in addition to the trazodone.    He would also like his ears irrigated today feels like they are full of wax.  Past Medical History:  Diagnosis Date   Chronic back pain    only mild loss of T12-L1 disk ht on xray 1-12   COPD (chronic obstructive pulmonary disease) (HCC)    Diabetes mellitus    w/ neuropathy   Hypertension    Smoker     Past Surgical History:  Procedure Laterality Date   LUMBAR LAMINECTOMY/DECOMPRESSION MICRODISCECTOMY N/A 09/25/2017   Procedure: LAMINECTOMY LUMBAR FOUR- LUMBAR FIVE;  Surgeon: Ashok Pall, MD;  Location: [facility];  Service: Neurosurgery;  Laterality: N/A;   UVULOPALATOPHARYNGOPLASTY     2001    Family History  Problem Relation Age of Onset   Diabetes Mother    Alcohol abuse Father        Liver disease killed father @ age 63.   Alcohol abuse Brother    Heart attack Brother        Died at age 2.    Social History   Socioeconomic History   Marital status: Single    Spouse  name: Not on file   Number of children: 0   Years of education: 12th grade   Highest education level: High school graduate  Occupational History    Comment: Retired  Tobacco Use   Smoking status: Former    Packs/day: 1.00    Years: 20.00    Pack years: 20.00    Types: Cigarettes    Quit date: 2013    Years since quitting: 9.4   Smokeless tobacco: Never  Vaping Use   Vaping Use: Never used  Substance and Sexual Activity   Alcohol use: No   Drug use: No   Sexual activity: Yes  Other Topics Concern   Not on file  Social History Narrative   Lives alone. Due to his neuropathy, he is not able to do much exercise. He enjoys watching t.v. in his free time.   Social Determinants of Health   Financial Resource Strain: Low Risk    Difficulty of Paying Living Expenses: Not hard at all  Food Insecurity: No Food Insecurity   Worried About Charity fundraiser in the Last Year: Never true   [place] in the Last Year: Never true  Transportation Needs: No Transportation Needs   Lack of Transportation (Medical): No   Lack of Transportation (Non-Medical):  No  Physical Activity: Inactive   Days of Exercise per Week: 0 days   Minutes of Exercise per Session: 0 min  Stress: No Stress Concern Present   Feeling of Stress : Not at all  Social Connections: Socially Isolated   Frequency of Communication with Friends and Family: Once a week   Frequency of Social Gatherings with Friends and Family: Never   Attends Religious Services: More than 4 times per year   Active Member of [employer] or Organizations: No   Attends Archivist Meetings: Never   Marital Status: Divorced  Human resources officer Violence: Not At Risk   Fear of Current or Ex-Partner: No   Emotionally Abused: No   Physically Abused: No   Sexually Abused: No    Outpatient Medications Prior to Visit  Medication Sig Dispense Refill   ACCU-CHEK GUIDE test strip USE TO CHECK BLOOD SUGAR UP TO 4 TIMES A DAY AS DIRECTED 300  strip 6   Alcohol Swabs (B-D SINGLE USE SWABS REGULAR) PADS Check blood glucose 4 times daily Diagnosis diabetes E11.40 600 each 3   amitriptyline (ELAVIL) 75 MG tablet Take 1 tablet (75 mg total) by mouth at bedtime as needed for sleep. 90 tablet 1   blood glucose meter kit and supplies Dispense based on patient and insurance preference. Use up to four times daily as directed. Dx: E11.9 1 each 0   insulin glargine, 1 Unit Dial, (TOUJEO SOLOSTAR) 300 UNIT/ML Solostar Pen INJECT 25 UNITS SUBCUTANEOUSLY ONCE DAILY 4.5 mL 2   Insulin Pen Needle (B-D ULTRAFINE III SHORT PEN) 31G X 8 MM MISC USE FOR DAILY INSULIN INJECTION AS DIRECTED BY PHYSICIAN 100 each 4   losartan (COZAAR) 50 MG tablet Take 1 tablet (50 mg total) by mouth daily. 90 tablet 0   meloxicam (MOBIC) 7.5 MG tablet Take 1 tablet by mouth 2 (two) times daily.     metFORMIN (GLUCOPHAGE) 1000 MG tablet Take 1 tablet (1,000 mg total) by mouth 2 (two) times daily with a meal. 180 tablet 3   metoprolol tartrate (LOPRESSOR) 50 MG tablet TAKE 1/2 TABLET BY MOUTH TWICE A DAY 90 tablet 1   omeprazole (PRILOSEC) 40 MG capsule TAKE 1 CAPSULE BY MOUTH EVERY DAY 90 capsule 3   pravastatin (PRAVACHOL) 20 MG tablet TAKE 1 TABLET BY MOUTH TWICE PER WEEK AT BEDTIME. 24 tablet 3   tizanidine (ZANAFLEX) 6 MG capsule Take 1 capsule (6 mg total) by mouth 2 (two) times daily as needed for muscle spasms. 60 capsule 2   traZODone (DESYREL) 150 MG tablet TAKE 1 TABLET BY MOUTH EVERYDAY AT BEDTIME 90 tablet 2   pregabalin (LYRICA) 100 MG capsule 1 cap PO in the AM and 2 caps at bedtime. (Patient taking differently: Take 100 mg by mouth at bedtime. 1 cap PO in the AM and 2 caps at bedtime.) 90 capsule 3   No facility-administered medications prior to visit.    Allergies  Allergen Reactions   Amaryl [Glimepiride] Other (See Comments)    Frequent hypoglycemia   Atorvastatin Other (See Comments)    HA    Cymbalta [Duloxetine Hcl] Other (See Comments)     Dizziness, insomnia   Glipizide Other (See Comments)    HA   Losartan Other (See Comments)    Vision changes, HA.    Lyrica [Pregabalin] Other (See Comments)    Sedation   Quinapril Cough   Simvastatin Other (See Comments)    Severe myalgias.      ROS  Review of Systems    Objective:    Physical Exam Constitutional:      Appearance: He is well-developed.  HENT:     Head: Normocephalic and atraumatic.     Comments: TMS and canals are clear bilaterally Cardiovascular:     Rate and Rhythm: Normal rate and regular rhythm.     Heart sounds: Normal heart sounds.  Pulmonary:     Effort: Pulmonary effort is normal.     Breath sounds: Normal breath sounds.  Skin:    General: Skin is warm and dry.  Neurological:     Mental Status: He is alert and oriented to person, place, and time.  Psychiatric:        Behavior: Behavior normal.    BP (!) 105/57   Pulse 86   Ht 5' 6"  (1.676 m)   Wt 159 lb (72.1 kg)   SpO2 98%   BMI 25.66 kg/m  Wt Readings from Last 3 Encounters:  10/01/20 159 lb (72.1 kg)  06/25/20 162 lb (73.5 kg)  03/27/20 163 lb (73.9 kg)     Health Maintenance Due  Topic Date Due   Zoster Vaccines- Shingrix (1 of 2) Never done   COVID-19 Vaccine (3 - Booster for Moderna series) 01/22/2020    There are no preventive care reminders to display for this patient.  Lab Results  Component Value Date   TSH 2.11 08/12/2016   Lab Results  Component Value Date   WBC 3.3 (L) 03/28/2020   HGB 14.0 03/28/2020   HCT 41.1 03/28/2020   MCV 86.9 03/28/2020   PLT 169 03/28/2020   Lab Results  Component Value Date   NA 138 06/26/2020   K 4.4 06/26/2020   CO2 29 06/26/2020   GLUCOSE 269 (H) 06/26/2020   BUN 20 06/26/2020   CREATININE 0.73 06/26/2020   BILITOT 0.4 03/28/2020   ALKPHOS 51 08/12/2016   AST 15 03/28/2020   ALT 14 03/28/2020   PROT 7.7 03/28/2020   ALBUMIN 4.5 08/12/2016   CALCIUM 9.5 06/26/2020   ANIONGAP 5 09/18/2017   Lab Results   Component Value Date   CHOL 152 06/26/2020   Lab Results  Component Value Date   HDL 32 (L) 06/26/2020   Lab Results  Component Value Date   LDLCALC 95 06/26/2020   Lab Results  Component Value Date   TRIG 156 (H) 06/26/2020   Lab Results  Component Value Date   CHOLHDL 4.8 06/26/2020   Lab Results  Component Value Date   HGBA1C 7.4 (A) 10/01/2020      Assessment & Plan:   Problem List Items Addressed This Visit       Cardiovascular and Mediastinum   ESSENTIAL HYPERTENSION, BENIGN    And alsoEP is actually little low today.  We will cut losartan in half and have him come back for nurse visit in 2 weeks to recheck.  Adjust the metoprolol if needed.         Endocrine   Well controlled type 2 diabetes mellitus with peripheral neuropathy (HCC) - Primary   Relevant Medications   gabapentin (NEURONTIN) 600 MG tablet   Other Relevant Orders   POCT glycosylated hemoglobin (Hb A1C) (Completed)   Diabetic peripheral neuropathy (HCC)    We will switch back to gabapentin.  He feels the Lyrica is too sedating.  Continue with the amitriptyline at bedtime as well.       Relevant Medications   gabapentin (NEURONTIN) 600 MG tablet  Other   Lumbar stenosis with neurogenic claudication   Relevant Medications   gabapentin (NEURONTIN) 600 MG tablet   Other Visit Diagnoses     Bilateral impacted cerumen           Indication: Cerumen impaction of the ear(s) Medical necessity statement: On physical examination, cerumen impairs clinically significant portions of the external auditory canal, and tympanic membrane. Noted obstructive, copious cerumen Consent: Discussed benefits and risks of procedure and verbal consent obtained Procedure: Patient was prepped for the procedure. Utilized an otoscope to assess and take note of the ear canal, the tympanic membrane, and the presence, amount, and placement of the cerumen. Gentle water irrigation and soft plastic curette was  utilized to remove cerumen.  Post procedure examination: shows cerumen was completely removed. Patient tolerated procedure well. The patient is made aware that they may experience temporary vertigo, temporary hearing loss, and temporary discomfort. If these symptom last for more than 24 hours to call the clinic or proceed to the ED.    Meds ordered this encounter  Medications   gabapentin (NEURONTIN) 600 MG tablet    Sig: Take 2 tablets (1,200 mg total) by mouth 3 (three) times daily.    Dispense:  540 tablet    Refill:  0    Please d/c any refills on Lyrica. Says lyrica was too sedating and wants to go back to gaba     Follow-up: Return in about 2 weeks (around 10/15/2020) for Check blood pressure with nurse visit in 2 weeks after cutting losartan in half.Beatrice Lecher, MD

## 2020-10-07 ENCOUNTER — Other Ambulatory Visit: Payer: Self-pay | Admitting: Family Medicine

## 2020-10-09 ENCOUNTER — Telehealth: Payer: Self-pay | Admitting: Family Medicine

## 2020-10-09 NOTE — Chronic Care Management (AMB) (Signed)
  Chronic Care Management   Note  10/09/2020 Name: Lafe Clerk MRN: [id number] DOB: 09/30/1955  Lenn Volker is a 65 y.o. year old male who is a primary care patient of Linford Arnold, Barbarann Ehlers, MD. I reached out to Delia Chimes by phone today in response to a referral sent by Mr. Alyson Reedy PCP, Agapito Games, MD.   Mr. Luczak was given information about Chronic Care Management services today including:  CCM service includes personalized support from designated clinical staff supervised by his physician, including individualized plan of care and coordination with other care providers 24/7 contact phone numbers for assistance for urgent and routine care needs. Service will only be billed when office clinical staff spend 20 minutes or more in a month to coordinate care. Only one practitioner may furnish and bill the service in a calendar month. The patient may stop CCM services at any time (effective at the end of the month) by phone call to the office staff.   Patient agreed to services and verbal consent obtained.   Follow up plan:   Carmell Austria Upstream Scheduler

## 2020-10-15 ENCOUNTER — Ambulatory Visit: Payer: Medicare HMO

## 2020-10-22 DIAGNOSIS — L602 Onychogryphosis: Secondary | ICD-10-CM | POA: Diagnosis not present

## 2020-10-22 DIAGNOSIS — E1142 Type 2 diabetes mellitus with diabetic polyneuropathy: Secondary | ICD-10-CM | POA: Diagnosis not present

## 2020-11-09 ENCOUNTER — Telehealth: Payer: Self-pay | Admitting: Pharmacist

## 2020-11-09 NOTE — Chronic Care Management (AMB) (Signed)
    Chronic Care Management Pharmacy Assistant   Name: Caston Coopersmith  MRN: [id number] DOB: 1955-08-11  Justin Goodman is an 65 y.o. year old male who presents for his initial CCM visit with the clinical pharmacist.  Recent office visits:  10/01/20 Beatrice Lecher MD(PCP)- Patient was seen for well controlled Diabetes with peripheral neuropathy. Patient was started on Gabapentin 1200 MG TID. Hemoglobin labs were placed. Patient had ear irrigation. Patient advised to come back for nurse visit in 2 weeks to recheck.  Adjust the metoprolol if needed.  08/08/20 Iran Planas PA-C- Patient was seen for Annual wellness exam. Follow-up with Hali Marry, MD as planned.  Recent consult visits:  None noted  Hospital visits:  None in previous 6 months  Medications: Outpatient Encounter Medications as of 11/09/2020  Medication Sig   ACCU-CHEK GUIDE test strip USE TO CHECK BLOOD SUGAR UP TO 4 TIMES A DAY AS DIRECTED   Alcohol Swabs (B-D SINGLE USE SWABS REGULAR) PADS Check blood glucose 4 times daily Diagnosis diabetes E11.40   amitriptyline (ELAVIL) 75 MG tablet Take 1 tablet (75 mg total) by mouth at bedtime as needed for sleep.   blood glucose meter kit and supplies Dispense based on patient and insurance preference. Use up to four times daily as directed. Dx: E11.9   gabapentin (NEURONTIN) 600 MG tablet Take 2 tablets (1,200 mg total) by mouth 3 (three) times daily.   insulin glargine, 1 Unit Dial, (TOUJEO SOLOSTAR) 300 UNIT/ML Solostar Pen INJECT 25 UNITS SUBCUTANEOUSLY ONCE DAILY   Insulin Pen Needle (B-D ULTRAFINE III SHORT PEN) 31G X 8 MM MISC USE FOR DAILY INSULIN INJECTION AS DIRECTED BY PHYSICIAN   losartan (COZAAR) 50 MG tablet TAKE 1 TABLET BY MOUTH EVERY DAY   meloxicam (MOBIC) 7.5 MG tablet Take 1 tablet by mouth 2 (two) times daily.   metFORMIN (GLUCOPHAGE) 1000 MG tablet Take 1 tablet (1,000 mg total) by mouth 2 (two) times daily with a meal.   metoprolol tartrate  (LOPRESSOR) 50 MG tablet TAKE 1/2 TABLET BY MOUTH TWICE A DAY   omeprazole (PRILOSEC) 40 MG capsule TAKE 1 CAPSULE BY MOUTH EVERY DAY   pravastatin (PRAVACHOL) 20 MG tablet TAKE 1 TABLET BY MOUTH TWICE PER WEEK AT BEDTIME.   tizanidine (ZANAFLEX) 6 MG capsule Take 1 capsule (6 mg total) by mouth 2 (two) times daily as needed for muscle spasms.   traZODone (DESYREL) 150 MG tablet TAKE 1 TABLET BY MOUTH EVERYDAY AT BEDTIME   No facility-administered encounter medications on file as of 11/09/2020.    Current Medication List amitriptyline (ELAVIL) 75 MG tablet last filled 09/21/20 90 DS Gabapentin 600 MG  insulin glargine, 1 Unit Dial, 300 UNIT/ML Solostar Pen last filled 09/02/20 54 DS losartan (COZAAR) 50 MG tablet last filled 07/13/20 90 DS meloxicam (MOBIC) 7.5 MG tablet last filled 08/23/20 90 DS metformin (GLUCOPHAGE) 1000 MG tablet last filled metoprolol tartrate (LOPRESSOR) 50 MG tablet last filled 09/15/20 90 DS omeprazole (PRILOSEC) 40 MG capsule last filled 08/08/20 90 DS pravastatin (PRAVACHOL) 20 MG tablet last filled 09/21/20 84 DS tizanidine (ZANAFLEX) 6 MG capsule last filled 06/25/20 30 DS trazodone (DESYREL) 150 MG tablet last filled 07/02/20 90 DS  [facility] Pharmacist Assistant [phone]

## 2020-11-19 ENCOUNTER — Telehealth: Payer: Medicare HMO

## 2020-11-19 NOTE — Progress Notes (Deleted)
Current Barriers:  {PHARMCACYBARRIERS:21091514}  Pharmacist Clinical Goal(s):  Over the next *** days, patient will {PHARMACYGOALCHOICES:25079} through collaboration with PharmD and provider.   Interventions: 1:1 collaboration with Hali Marry, MD regarding development and update of comprehensive plan of care as evidenced by provider attestation and co-signature Inter-disciplinary care team collaboration (see longitudinal plan of care) Comprehensive medication review performed; medication list updated in electronic medical record  Diabetes:  Uncontrolled/controlled; current treatment:toujeo, metformin 1g BID;   Current glucose readings: fasting glucose: ***, post prandial glucose: ***  Denies/reports hypoglycemic/hyperglycemic symptoms  Current meal patterns: breakfast: ***; lunch: ***; dinner: ***; snacks: ***; drinks: ***  Current exercise: ***  {PHARMACYINTERVENTION:21091513},  Hypertension:  Uncontrolled/controlled; current treatment:losartan 54m, metoprolol 223mBID;   Current home readings:   Denies/reports hypotensive/hypertensive symptoms  {PHARMACYINTERVENTION:21091513}, and  Hyperlipidemia:  Uncontrolled/controlled; current treatment:pravastatin 2024maily;   Medications previously tried: ***   Current dietary patterns: ***  {PHARMACYINTERVENTION:21091513}  Patient Goals/Self-Care Activities Over the next *** days, patient will:  {PHARMACYPATIENTGOALS:25081}  Follow Up Plan: {CM FOLLOW UP PLAWUXL:24401}*     Chronic Care Management Pharmacy Note  11/19/2020 Name:  Justin BainN:  [id number]:  [birth year]ummary:  Recommendations/Changes made from today's visit:  Plan:  Subjective: Justin Goodman an 65 61o. year old male who is a primary patient of Metheney, CatRene KocherD.  The CCM team was consulted for assistance with disease management and care coordination needs.    {CCMTELEPHONEFACETOFACE:21091510} for  {CCMINITIALFOLLOWUPCHOICE:21091511} in response to provider referral for pharmacy case management and/or care coordination services.   Consent to Services:  {CCMCONSENTOPTIONS:25074}  Patient Care Team: MetHali MarryD as PCP - General (Family Medicine) [facility]ist (Pharmacist)  Recent office visits:  10/01/20 CatBeatrice Lecher(PCP)- Patient was seen for well controlled Diabetes with peripheral neuropathy. Patient was started on Gabapentin 1200 MG TID. Hemoglobin labs were placed. Patient had ear irrigation. Patient advised to come back for nurse visit in 2 weeks to recheck.  Adjust the metoprolol if needed.   08/08/20 JadIran Planas-C- Patient was seen for Annual wellness exam. Follow-up with MetHali MarryD as planned.   Recent consult visits:  None noted   Hospital visits:  None in previous 6 months  Objective:  Lab Results  Component Value Date   CREATININE 0.73 06/26/2020   CREATININE 0.78 03/28/2020   CREATININE 0.65 (L) 09/27/2019    Lab Results  Component Value Date   HGBA1C 7.4 (A) 10/01/2020   Last diabetic Eye exam:  Lab Results  Component Value Date/Time   HMDIABEYEEXA Retinopathy (A) 08/03/2020 12:00 AM    Last diabetic Foot exam: No results found for: HMDIABFOOTEX      Component Value Date/Time   CHOL 152 06/26/2020 0810   TRIG 156 (H) 06/26/2020 0810   HDL 32 (L) 06/26/2020 0810   CHOLHDL 4.8 06/26/2020 0810   VLDL 62 (H) 01/15/2016 0806   LDLCALC 95 06/26/2020 0810   LDLDIRECT 97 09/21/2007 2129    Hepatic Function Latest Ref Rng & Units 03/28/2020 09/27/2019 06/27/2019  Total Protein 6.1 - 8.1 g/dL 7.7 7.2 7.6  Albumin 3.6 - 5.1 g/dL - - -  AST 10 - 35 U/L 15 19 19   ALT 9 - 46 U/L 14 18 13   Alk Phosphatase 40 - 115 U/L - - -  Total Bilirubin 0.2 - 1.2 mg/dL 0.4 0.4 0.5  Bilirubin, Direct <=0.2 mg/dL - - -    Lab Results  Component  Value Date/Time   TSH 2.11 08/12/2016 01:58 PM   TSH 2.31  01/15/2016 08:06 AM   FREET4 1.2 08/12/2016 01:58 PM    CBC Latest Ref Rng & Units 03/28/2020 09/27/2019 01/06/2018  WBC 3.8 - 10.8 Thousand/uL 3.3(L) 4.2 3.0(L)  Hemoglobin 13.2 - 17.1 g/dL 14.0 13.1(L) 13.9  Hematocrit 38.5 - 50.0 % 41.1 38.7 42.0  Platelets 140 - 400 Thousand/uL 169 193 170    No results found for: VD25OH  Clinical ASCVD: {YES/NO:21197} The 10-year ASCVD risk score Mikey Bussing DC Jr., et al., 2013) is: 23.7%   Values used to calculate the score:     Age: 89 years     Sex: Male     Is Non-Hispanic African American: No     Diabetic: Yes     Tobacco smoker: No     Systolic Blood Pressure: 409 mmHg     Is BP treated: Yes     HDL Cholesterol: 32 mg/dL     Total Cholesterol: 152 mg/dL    Other: (CHADS2VASc if Afib, PHQ9 if depression, MMRC or CAT for COPD, ACT, DEXA)  Social History   Tobacco Use  Smoking Status Former   Packs/day: 1.00   Years: 20.00   Pack years: 20.00   Types: Cigarettes   Quit date: 2013   Years since quitting: 9.6  Smokeless Tobacco Never   BP Readings from Last 3 Encounters:  10/01/20 (!) 105/57  06/25/20 130/61  03/27/20 (!) 132/58   Pulse Readings from Last 3 Encounters:  10/01/20 86  06/25/20 78  03/27/20 87   Wt Readings from Last 3 Encounters:  10/01/20 159 lb (72.1 kg)  06/25/20 162 lb (73.5 kg)  03/27/20 163 lb (73.9 kg)    Assessment: Review of patient past medical history, allergies, medications, health status, including review of consultants reports, laboratory and other test data, was performed as part of comprehensive evaluation and provision of chronic care management services.   SDOH:  (Social Determinants of Health) assessments and interventions performed:    CCM Care Plan  Allergies  Allergen Reactions   Amaryl [Glimepiride] Other (See Comments)    Frequent hypoglycemia   Atorvastatin Other (See Comments)    HA    Cymbalta [Duloxetine Hcl] Other (See Comments)    Dizziness, insomnia   Glipizide Other  (See Comments)    HA   Losartan Other (See Comments)    Vision changes, HA.    Lyrica [Pregabalin] Other (See Comments)    Sedation   Quinapril Cough   Simvastatin Other (See Comments)    Severe myalgias.      Medications Reviewed Today     Reviewed by Hali Marry, MD (Physician) on 10/01/20 at 1035  Med List Status: <None>   Medication Order Taking? Sig Documenting Provider Last Dose Status Informant  ACCU-CHEK GUIDE test strip [id number] Yes USE TO CHECK BLOOD SUGAR UP TO 4 TIMES A DAY AS DIRECTED Hali Marry, MD Taking Active   Alcohol Swabs (B-D SINGLE USE SWABS REGULAR) PADS [id number] Yes Check blood glucose 4 times daily Diagnosis diabetes E11.40 Hali Marry, MD Taking Active   amitriptyline (ELAVIL) 75 MG tablet [id number] Yes Take 1 tablet (75 mg total) by mouth at bedtime as needed for sleep. Hali Marry, MD Taking Active   blood glucose meter kit and supplies [id number] Yes Dispense based on patient and insurance preference. Use up to four times daily as directed. Dx: E11.9 Hali Marry, MD Taking Active   gabapentin (  NEURONTIN) 600 MG tablet [id number]  Take 2 tablets (1,200 mg total) by mouth 3 (three) times daily. Hali Marry, MD  Active   insulin glargine, 1 Unit Dial, (TOUJEO SOLOSTAR) 300 UNIT/ML Solostar Pen [id number] Yes INJECT 25 UNITS SUBCUTANEOUSLY ONCE DAILY Hali Marry, MD Taking Active   Insulin Pen Needle (B-D ULTRAFINE III SHORT PEN) 31G X 8 MM MISC [id number] Yes USE FOR DAILY INSULIN INJECTION AS DIRECTED BY PHYSICIAN Hali Marry, MD Taking Active   losartan (COZAAR) 50 MG tablet [id number] Yes Take 1 tablet (50 mg total) by mouth daily. Hali Marry, MD Taking Active   meloxicam [facility]) 7.5 MG tablet [id number] Yes Take 1 tablet by mouth 2 (two) times daily. Hinda Lenis, MD Taking Active   metFORMIN (GLUCOPHAGE) 1000 MG tablet [id number] Yes Take 1 tablet (1,000 mg total) by  mouth 2 (two) times daily with a meal. Hali Marry, MD Taking Active   metoprolol tartrate (LOPRESSOR) 50 MG tablet [id number] Yes TAKE 1/2 TABLET BY MOUTH TWICE A DAY Hali Marry, MD Taking Active   omeprazole (PRILOSEC) 40 MG capsule [id number] Yes TAKE 1 CAPSULE BY MOUTH EVERY DAY Hali Marry, MD Taking Active   pravastatin (PRAVACHOL) 20 MG tablet [id number] Yes TAKE 1 TABLET BY MOUTH TWICE PER WEEK AT BEDTIME. Hali Marry, MD Taking Active   tizanidine (ZANAFLEX) 6 MG capsule [id number] Yes Take 1 capsule (6 mg total) by mouth 2 (two) times daily as needed for muscle spasms. Hali Marry, MD Taking Active   traZODone (DESYREL) 150 MG tablet [id number] Yes TAKE 1 TABLET BY MOUTH EVERYDAY AT BEDTIME Hali Marry, MD Taking Active             Patient Active Problem List   Diagnosis Date Noted   Fall at home, initial encounter 09/26/2019   Lumbar stenosis with neurogenic claudication 09/25/2017   Primary osteoarthritis of both knees 03/10/2017   Aortic atherosclerosis ([place]) 08/13/2016   Chronic constipation 10/27/2014   Right shoulder pain 09/11/2014   Combined forms of age-related cataract of left eye 05/25/2014   Asthma with COPD (chronic obstructive pulmonary disease) ([place]) 09/16/2012   Restrictive lung disease 08/19/2012   AR (allergic rhinitis) 07/21/2012   Former smoker 07/21/2012   Erectile dysfunction associated with type 2 diabetes mellitus ([place]) 03/15/2012   Anomalous aortic origin of coronary artery 09/12/2010   Proteinuria 08/06/2010   Insomnia 05/14/2010   Spinal stenosis of lumbar region 05/01/2010   Well controlled type 2 diabetes mellitus with peripheral neuropathy ([place]) 04/22/2010   Hyperlipidemia 04/22/2010   Diabetic peripheral neuropathy ([place]) 04/22/2010   ESSENTIAL HYPERTENSION, BENIGN 04/22/2010    Immunization History  Administered Date(s) Administered   Fluad Quad(high Dose 65+) 12/22/2018, 01/10/2020    Influenza, High Dose Seasonal PF 01/08/2017, 01/20/2018   Influenza,inj,Quad PF,6+ Mos 01/21/2013, 01/10/2014, 12/28/2014, 01/10/2016   Moderna Sars-Covid-2 Vaccination 07/25/2019, 08/22/2019   PNEUMOCOCCAL CONJUGATE-20 07/11/2020   Pneumococcal Polysaccharide-23 08/19/2012   Tdap 06/26/2015    Conditions to be addressed/monitored: {CCM ASSESSMENT DISEASE OPTIONS:25047}  There are no care plans that you recently modified to display for this patient.   Medication Assistance: {MEDASSISTANCEINFO:25044}  Patient's preferred pharmacy is:  [facility] - [place], [place] [address] Phone: [phone] Fax: [phone]  [facility] [place] [place] [facility] [address] [place] [address] Phone: [phone] Fax:  [phone]  Uses pill box? {Yes or If no, why not?:20788} Pt endorses ***% compliance  Follow Up:  {FOLLOWUP:24991}  Plan: {CM FOLLOW UP PLAN:25073}  SIG***

## 2020-11-20 ENCOUNTER — Other Ambulatory Visit: Payer: Self-pay | Admitting: Family Medicine

## 2020-11-21 ENCOUNTER — Other Ambulatory Visit: Payer: Self-pay | Admitting: Family Medicine

## 2020-11-21 DIAGNOSIS — E1142 Type 2 diabetes mellitus with diabetic polyneuropathy: Secondary | ICD-10-CM

## 2020-11-26 ENCOUNTER — Ambulatory Visit (INDEPENDENT_AMBULATORY_CARE_PROVIDER_SITE_OTHER): Payer: Medicare HMO | Admitting: Pharmacist

## 2020-11-26 ENCOUNTER — Other Ambulatory Visit: Payer: Self-pay

## 2020-11-26 DIAGNOSIS — I10 Essential (primary) hypertension: Secondary | ICD-10-CM

## 2020-11-26 DIAGNOSIS — E1142 Type 2 diabetes mellitus with diabetic polyneuropathy: Secondary | ICD-10-CM

## 2020-11-26 DIAGNOSIS — E785 Hyperlipidemia, unspecified: Secondary | ICD-10-CM

## 2020-11-26 NOTE — Progress Notes (Signed)
Chronic Care Management Pharmacy Note  11/26/2020 Name:  Justin Goodman MRN:  [id number] DOB:  1956/01/05  Summary: addressed DM, HTN, HLD. Patient believes gabapentin is giving him eye problems and asked to discontinue.   Recommendations/Changes made from today's visit: Advised patient to see eye doctor to confirm there are no other issues at present with his vision or make appt with PCP for physical exam and discussion. In meantime did not recommend stopping cold [place], advised patient to decrease dosage in half (1 tablet TID) to see if this helps.   Plan: f/u with pharmacist on site in 2 weeks for more thorough review of medications.   Subjective: Justin Goodman is an 65 y.o. year old male who is a primary patient of Metheney, Rene Kocher, MD.  The CCM team was consulted for assistance with disease management and care coordination needs.    Engaged with patient by telephone for initial visit in response to provider referral for pharmacy case management and/or care coordination services.   Consent to Services:  The patient was given information about Chronic Care Management services, agreed to services, and gave verbal consent prior to initiation of services.  Please see initial visit note for detailed documentation.   Patient Care Team: Hali Marry, MD as PCP - General (Family Medicine) Darius Bump, [facility] as Pharmacist (Pharmacist)  Recent office visits:  10/01/20 Beatrice Lecher MD(PCP)- Patient was seen for well controlled Diabetes with peripheral neuropathy. Patient was started on Gabapentin 1200 MG TID. Hemoglobin labs were placed. Patient had ear irrigation. Patient advised to come back for nurse visit in 2 weeks to recheck.  Adjust the metoprolol if needed.   08/08/20 Iran Planas PA-C- Patient was seen for Annual wellness exam. Follow-up with Hali Marry, MD as planned.   Recent consult visits:  None noted   Hospital visits:  None in previous 6  months  Objective:  Lab Results  Component Value Date   CREATININE 0.73 06/26/2020   CREATININE 0.78 03/28/2020   CREATININE 0.65 (L) 09/27/2019    Lab Results  Component Value Date   HGBA1C 7.4 (A) 10/01/2020   Last diabetic Eye exam:  Lab Results  Component Value Date/Time   HMDIABEYEEXA Retinopathy (A) 08/03/2020 12:00 AM    Last diabetic Foot exam: No results found for: HMDIABFOOTEX      Component Value Date/Time   CHOL 152 06/26/2020 0810   TRIG 156 (H) 06/26/2020 0810   HDL 32 (L) 06/26/2020 0810   CHOLHDL 4.8 06/26/2020 0810   VLDL 62 (H) 01/15/2016 0806   LDLCALC 95 06/26/2020 0810   LDLDIRECT 97 09/21/2007 2129    Hepatic Function Latest Ref Rng & Units 03/28/2020 09/27/2019 06/27/2019  Total Protein 6.1 - 8.1 g/dL 7.7 7.2 7.6  Albumin 3.6 - 5.1 g/dL - - -  AST 10 - 35 U/L _0 ALT 9 - 46 U/L _1 Alk Phosphatase 40 - 115 U/L - - -  Total Bilirubin 0.2 - 1.2 mg/dL 0.4 0.4 0.5  Bilirubin, Direct <=0.2 mg/dL - - -    Lab Results  Component Value Date/Time   TSH 2.11 08/12/2016 01:58 PM   TSH 2.31 01/15/2016 08:06 AM   FREET4 1.2 08/12/2016 01:58 PM    CBC Latest Ref Rng & Units 03/28/2020 09/27/2019 01/06/2018  WBC 3.8 - 10.8 Thousand/uL 3.3(L) 4.2 3.0(L)  Hemoglobin 13.2 - 17.1 g/dL 14.0 13.1(L) 13.9  Hematocrit 38.5 - 50.0 % 41.1 38.7 42.0  Platelets  140 - 400 Thousand/uL 169 193 170    Social History   Tobacco Use  Smoking Status Former   Packs/day: 1.00   Years: 20.00   Pack years: 20.00   Types: Cigarettes   Quit date: 2013   Years since quitting: 9.6  Smokeless Tobacco Never   BP Readings from Last 3 Encounters:  10/01/20 (!) 105/57  06/25/20 130/61  03/27/20 (!) 132/58   Pulse Readings from Last 3 Encounters:  10/01/20 86  06/25/20 78  03/27/20 87   Wt Readings from Last 3 Encounters:  10/01/20 159 lb (72.1 kg)  06/25/20 162 lb (73.5 kg)  03/27/20 163 lb (73.9 kg)    Assessment: Review of patient past medical  history, allergies, medications, health status, including review of consultants reports, laboratory and other test data, was performed as part of comprehensive evaluation and provision of chronic care management services.   SDOH:  (Social Determinants of Health) assessments and interventions performed:    CCM Care Plan  Allergies  Allergen Reactions   Amaryl [Glimepiride] Other (See Comments)    Frequent hypoglycemia   Atorvastatin Other (See Comments)    HA    Cymbalta [Duloxetine Hcl] Other (See Comments)    Dizziness, insomnia   Glipizide Other (See Comments)    HA   Losartan Other (See Comments)    Vision changes, HA.    Lyrica [Pregabalin] Other (See Comments)    Sedation   Quinapril Cough   Simvastatin Other (See Comments)    Severe myalgias.      Medications Reviewed Today     Reviewed by Hali Marry, MD (Physician) on 10/01/20 at 1035  Med List Status: <None>   Medication Order Taking? Sig Documenting Provider Last Dose Status Informant  ACCU-CHEK GUIDE test strip [id number] Yes USE TO CHECK BLOOD SUGAR UP TO 4 TIMES A DAY AS DIRECTED Hali Marry, MD Taking Active   Alcohol Swabs (B-D SINGLE USE SWABS REGULAR) PADS [id number] Yes Check blood glucose 4 times daily Diagnosis diabetes E11.40 Hali Marry, MD Taking Active   amitriptyline (ELAVIL) 75 MG tablet [id number] Yes Take 1 tablet (75 mg total) by mouth at bedtime as needed for sleep. Hali Marry, MD Taking Active   blood glucose meter kit and supplies [id number] Yes Dispense based on patient and insurance preference. Use up to four times daily as directed. Dx: E11.9 Hali Marry, MD Taking Active   gabapentin (NEURONTIN) 600 MG tablet [id number]  Take 2 tablets (1,200 mg total) by mouth 3 (three) times daily. Hali Marry, MD  Active   insulin glargine, 1 Unit Dial, (TOUJEO SOLOSTAR) 300 UNIT/ML Solostar Pen [id number] Yes INJECT 25 UNITS SUBCUTANEOUSLY ONCE DAILY  Hali Marry, MD Taking Active   Insulin Pen Needle (B-D ULTRAFINE III SHORT PEN) 31G X 8 MM MISC [id number] Yes USE FOR DAILY INSULIN INJECTION AS DIRECTED BY PHYSICIAN Hali Marry, MD Taking Active   losartan (COZAAR) 50 MG tablet [id number] Yes Take 1 tablet (50 mg total) by mouth daily. Hali Marry, MD Taking Active   meloxicam [facility]) 7.5 MG tablet [id number] Yes Take 1 tablet by mouth 2 (two) times daily. Hinda Lenis, MD Taking Active   metFORMIN (GLUCOPHAGE) 1000 MG tablet [id number] Yes Take 1 tablet (1,000 mg total) by mouth 2 (two) times daily with a meal. Hali Marry, MD Taking Active   metoprolol tartrate (LOPRESSOR) 50 MG tablet [id number] Yes TAKE 1/2 TABLET BY MOUTH TWICE A DAY Beatrice Lecher  D, MD Taking Active   omeprazole (PRILOSEC) 40 MG capsule [id number] Yes TAKE 1 CAPSULE BY MOUTH EVERY DAY Hali Marry, MD Taking Active   pravastatin (PRAVACHOL) 20 MG tablet [id number] Yes TAKE 1 TABLET BY MOUTH TWICE PER WEEK AT BEDTIME. Hali Marry, MD Taking Active   tizanidine (ZANAFLEX) 6 MG capsule [id number] Yes Take 1 capsule (6 mg total) by mouth 2 (two) times daily as needed for muscle spasms. Hali Marry, MD Taking Active   traZODone (DESYREL) 150 MG tablet [id number] Yes TAKE 1 TABLET BY MOUTH EVERYDAY AT BEDTIME Hali Marry, MD Taking Active             Patient Active Problem List   Diagnosis Date Noted   Fall at home, initial encounter 09/26/2019   Lumbar stenosis with neurogenic claudication 09/25/2017   Primary osteoarthritis of both knees 03/10/2017   Aortic atherosclerosis ([place]) 08/13/2016   Chronic constipation 10/27/2014   Right shoulder pain 09/11/2014   Combined forms of age-related cataract of left eye 05/25/2014   Asthma with COPD (chronic obstructive pulmonary disease) ([place]) 09/16/2012   Restrictive lung disease 08/19/2012   AR (allergic rhinitis) 07/21/2012   Former smoker  07/21/2012   Erectile dysfunction associated with type 2 diabetes mellitus ([place]) 03/15/2012   Anomalous aortic origin of coronary artery 09/12/2010   Proteinuria 08/06/2010   Insomnia 05/14/2010   Spinal stenosis of lumbar region 05/01/2010   Well controlled type 2 diabetes mellitus with peripheral neuropathy ([place]) 04/22/2010   Hyperlipidemia 04/22/2010   Diabetic peripheral neuropathy ([place]) 04/22/2010   ESSENTIAL HYPERTENSION, BENIGN 04/22/2010    Immunization History  Administered Date(s) Administered   Fluad Quad(high Dose 65+) 12/22/2018, 01/10/2020   Influenza, High Dose Seasonal PF 01/08/2017, 01/20/2018   Influenza,inj,Quad PF,6+ Mos 01/21/2013, 01/10/2014, 12/28/2014, 01/10/2016   Moderna Sars-Covid-2 Vaccination 07/25/2019, 08/22/2019   PNEUMOCOCCAL CONJUGATE-20 07/11/2020   Pneumococcal Polysaccharide-23 08/19/2012   Tdap 06/26/2015    Conditions to be addressed/monitored: HTN, HLD, and DMII  There are no care plans that you recently modified to display for this patient.   Medication Assistance:  TBD  Patient's preferred pharmacy is:  [facility] ([facility]) - [place], [place] [address] Phone: [phone] Fax: [phone]  [facility] [place] [address] [place] [address] Phone: [phone] Fax: [phone]  Uses pill box? TBD  Follow Up:  Patient agrees to Care Plan and Follow-up.  Plan: Face to Face appointment with care management team member scheduled for: 2 weeks  Darius Bump

## 2020-11-28 NOTE — Patient Instructions (Signed)
Visit Information   PATIENT GOALS:   Goals Addressed             This Visit's Progress    Medication Management       Patient Goals/Self-Care Activities Over the next 14 days, patient will:  take medications as prescribed  Follow Up Plan: Face to Face appointment with care management team member scheduled for: 2 weeks         Consent to CCM Services: Mr. Eyerman was given information about Chronic Care Management services including:  CCM service includes personalized support from designated clinical staff supervised by his physician, including individualized plan of care and coordination with other care providers 24/7 contact phone numbers for assistance for urgent and routine care needs. Service will only be billed when office clinical staff spend 20 minutes or more in a month to coordinate care. Only one practitioner may furnish and bill the service in a calendar month. The patient may stop CCM services at any time (effective at the end of the month) by phone call to the office staff. The patient will be responsible for cost sharing (co-pay) of up to 20% of the service fee (after annual deductible is met).  Patient agreed to services and verbal consent obtained.   Patient verbalizes understanding of instructions provided today and agrees to view in [place].   Face to Face appointment with care management team member scheduled for:  2 weeks Darius Bump   CLINICAL CARE PLAN: Patient Care Plan: Medication Management     Problem Identified: DM, HTN, HLD      Long-Range Goal: Disease Progression Prevention   Start Date: 11/26/2020  This Visit's Progress: On track  Priority: High  Note:   Current Barriers:  None at present  Pharmacist Clinical Goal(s):  Over the next 14 days, patient will adhere to plan to optimize therapeutic regimen for chronic conditions as evidenced by report of adherence to recommended medication management changes through collaboration with  PharmD and provider.   Interventions: 1:1 collaboration with Hali Marry, MD regarding development and update of comprehensive plan of care as evidenced by provider attestation and co-signature Inter-disciplinary care team collaboration (see longitudinal plan of care) Comprehensive medication review performed; medication list updated in electronic medical record  Diabetes:  Uncontrolled; current treatment:toujeo 25 units daily, metformin 1g BID; a1c 7.4  Current glucose readings:to be discussed at future visits  Denies hypoglycemic/hyperglycemic symptoms  Current meal patterns: to be discussed at future visits  Current exercise: to be discussed at future visits  Recommended continue current regimen,  Hypertension:  Controlled; current treatment:losartan 35m daily, metoprolol 2103mBID;   Current home readings: to be discussed at future visits  Denies hypotensive/hypertensive symptoms  Recommended continue current regimen, and  Hyperlipidemia:  Controlled; current treatment:pravastatin 2034maily;   Recommended continue current regimen  Patient Goals/Self-Care Activities Over the next 14 days, patient will:  take medications as prescribed  Follow Up Plan: Face to Face appointment with care management team member scheduled for: 2 weeks

## 2020-11-29 ENCOUNTER — Other Ambulatory Visit: Payer: Self-pay | Admitting: Family Medicine

## 2020-12-04 ENCOUNTER — Other Ambulatory Visit: Payer: Self-pay | Admitting: Family Medicine

## 2020-12-04 DIAGNOSIS — E1142 Type 2 diabetes mellitus with diabetic polyneuropathy: Secondary | ICD-10-CM

## 2020-12-05 ENCOUNTER — Other Ambulatory Visit: Payer: Self-pay | Admitting: *Deleted

## 2020-12-05 ENCOUNTER — Other Ambulatory Visit: Payer: Self-pay

## 2020-12-05 ENCOUNTER — Other Ambulatory Visit: Payer: Self-pay | Admitting: Family Medicine

## 2020-12-05 ENCOUNTER — Ambulatory Visit: Payer: Medicare HMO | Admitting: Pharmacist

## 2020-12-05 DIAGNOSIS — I10 Essential (primary) hypertension: Secondary | ICD-10-CM

## 2020-12-05 DIAGNOSIS — J449 Chronic obstructive pulmonary disease, unspecified: Secondary | ICD-10-CM | POA: Diagnosis not present

## 2020-12-05 DIAGNOSIS — E785 Hyperlipidemia, unspecified: Secondary | ICD-10-CM

## 2020-12-05 DIAGNOSIS — E1142 Type 2 diabetes mellitus with diabetic polyneuropathy: Secondary | ICD-10-CM

## 2020-12-05 NOTE — Patient Instructions (Signed)
Visit Information  PATIENT GOALS:  Goals Addressed             This Visit's Progress    Medication Management       Patient Goals/Self-Care Activities Over the next 30 days, patient will:  take medications as prescribed  Follow Up Plan: Face to Face appointment with care management team member scheduled for: 1 month        Patient verbalizes understanding of instructions provided today and agrees to view in MyChart.   Face to Face appointment with care management team member scheduled for: 1 month

## 2020-12-05 NOTE — Progress Notes (Signed)
Chronic Care Management Pharmacy Note  12/05/2020 Name:  Justin Goodman MRN:  [id number] DOB:  May 24, 1955  Summary: addressed HTN, HLD, primarily DM. A1c 7.4 still not quite at goal but in past with higher insulin dose, patient reports hypoglycemia & passing out.   Recommendations/Changes made from today's visit: Assessed patient finances. Will initiate patient assistance for GLP1 for ASCVD benefit, also with goal of a1c control while reducing insulin to avoid hypoglycemia. Provided patient a copy of novonordisk patient assistance paperwork to fill out and provide at next visit.  Plan: f/u with pharmacist in 1 month (pt going on 3 week trip to [place] so unable to schedule sooner)  Subjective: Justin Goodman is an 65 y.o. year old male who is a primary patient of Metheney, Rene Kocher, MD.  The CCM team was consulted for assistance with disease management and care coordination needs.    Engaged with patient face to face for follow up visit in response to provider referral for pharmacy case management and/or care coordination services.   Consent to Services:  The patient was given information about Chronic Care Management services, agreed to services, and gave verbal consent prior to initiation of services.  Please see initial visit note for detailed documentation.   Patient Care Team: Hali Marry, MD as PCP - General (Family Medicine) Darius Bump, [facility] as Pharmacist (Pharmacist)  Recent office visits:  10/01/20 Beatrice Lecher MD(PCP)- Patient was seen for well controlled Diabetes with peripheral neuropathy. Patient was started on Gabapentin 1200 MG TID. Hemoglobin labs were placed. Patient had ear irrigation. Patient advised to come back for nurse visit in 2 weeks to recheck.  Adjust the metoprolol if needed.   08/08/20 Iran Planas PA-C- Patient was seen for Annual wellness exam. Follow-up with Hali Marry, MD as planned.   Recent consult visits:  None noted    Hospital visits:  None in previous 6 months  Objective:  Lab Results  Component Value Date   CREATININE 0.73 06/26/2020   CREATININE 0.78 03/28/2020   CREATININE 0.65 (L) 09/27/2019    Lab Results  Component Value Date   HGBA1C 7.4 (A) 10/01/2020   Last diabetic Eye exam:  Lab Results  Component Value Date/Time   HMDIABEYEEXA Retinopathy (A) 08/03/2020 12:00 AM    Last diabetic Foot exam: No results found for: HMDIABFOOTEX      Component Value Date/Time   CHOL 152 06/26/2020 0810   TRIG 156 (H) 06/26/2020 0810   HDL 32 (L) 06/26/2020 0810   CHOLHDL 4.8 06/26/2020 0810   VLDL 62 (H) 01/15/2016 0806   LDLCALC 95 06/26/2020 0810   LDLDIRECT 97 09/21/2007 2129    Hepatic Function Latest Ref Rng & Units 03/28/2020 09/27/2019 06/27/2019  Total Protein 6.1 - 8.1 g/dL 7.7 7.2 7.6  Albumin 3.6 - 5.1 g/dL - - -  AST 10 - 35 U/L 15 19 19   ALT 9 - 46 U/L 14 18 13   Alk Phosphatase 40 - 115 U/L - - -  Total Bilirubin 0.2 - 1.2 mg/dL 0.4 0.4 0.5  Bilirubin, Direct <=0.2 mg/dL - - -    Lab Results  Component Value Date/Time   TSH 2.11 08/12/2016 01:58 PM   TSH 2.31 01/15/2016 08:06 AM   FREET4 1.2 08/12/2016 01:58 PM    CBC Latest Ref Rng & Units 03/28/2020 09/27/2019 01/06/2018  WBC 3.8 - 10.8 Thousand/uL 3.3(L) 4.2 3.0(L)  Hemoglobin 13.2 - 17.1 g/dL 14.0 13.1(L) 13.9  Hematocrit 38.5 - 50.0 %  41.1 38.7 42.0  Platelets 140 - 400 Thousand/uL 169 193 170    Clinical ASCVD: No  The 10-year ASCVD risk score Mikey Bussing DC Jr., et al., 2013) is: 23.7%   Values used to calculate the score:     Age: 65 years     Sex: Male     Is Non-Hispanic African American: No     Diabetic: Yes     Tobacco smoker: No     Systolic Blood Pressure: 979 mmHg     Is BP treated: Yes     HDL Cholesterol: 32 mg/dL     Total Cholesterol: 152 mg/dL     Social History   Tobacco Use  Smoking Status Former   Packs/day: 1.00   Years: 20.00   Pack years: 20.00   Types: Cigarettes   Quit date:  2013   Years since quitting: 9.6  Smokeless Tobacco Never   BP Readings from Last 3 Encounters:  10/01/20 (!) 105/57  06/25/20 130/61  03/27/20 (!) 132/58   Pulse Readings from Last 3 Encounters:  10/01/20 86  06/25/20 78  03/27/20 87   Wt Readings from Last 3 Encounters:  10/01/20 159 lb (72.1 kg)  06/25/20 162 lb (73.5 kg)  03/27/20 163 lb (73.9 kg)    Assessment: Review of patient past medical history, allergies, medications, health status, including review of consultants reports, laboratory and other test data, was performed as part of comprehensive evaluation and provision of chronic care management services.   SDOH:  (Social Determinants of Health) assessments and interventions performed:    CCM Care Plan  Allergies  Allergen Reactions   Amaryl [Glimepiride] Other (See Comments)    Frequent hypoglycemia   Atorvastatin Other (See Comments)    HA    Cymbalta [Duloxetine Hcl] Other (See Comments)    Dizziness, insomnia   Glipizide Other (See Comments)    HA   Losartan Other (See Comments)    Vision changes, HA.    Lyrica [Pregabalin] Other (See Comments)    Sedation   Quinapril Cough   Simvastatin Other (See Comments)    Severe myalgias.      Medications Reviewed Today     Reviewed by Darius Bump, [facility] (Pharmacist) on 12/05/20 at 62  Med List Status: <None>   Medication Order Taking? Sig Documenting Provider Last Dose Status Informant  ACCU-CHEK GUIDE test strip [id number] Yes USE TO CHECK BLOOD SUGAR UP TO 4 TIMES A DAY AS DIRECTED Hali Marry, MD Taking Active   Alcohol Swabs (B-D SINGLE USE SWABS REGULAR) PADS [id number] Yes Check blood glucose 4 times daily Diagnosis diabetes E11.40 Hali Marry, MD Taking Active   amitriptyline (ELAVIL) 75 MG tablet [id number] Yes TAKE 1 TABLET (75 MG TOTAL) BY MOUTH AT BEDTIME AS NEEDED FOR SLEEP. Hali Marry, MD Taking Active   blood glucose meter kit and supplies [id number] Yes Dispense  based on patient and insurance preference. Use up to four times daily as directed. Dx: E11.9 Hali Marry, MD Taking Active   gabapentin (NEURONTIN) 600 MG tablet [id number] Yes Take 2 tablets (1,200 mg total) by mouth 3 (three) times daily. Hali Marry, MD Taking Active   insulin glargine, 1 Unit Dial, (TOUJEO SOLOSTAR) 300 UNIT/ML Solostar Pen [id number] Yes INJECT 25 UNITS SUBCUTANEOUSLY ONCE DAILY  Patient taking differently: Inject 26 Units into the skin daily. INJECT 26 UNITS SUBCUTANEOUSLY ONCE DAILY   Hali Marry, MD Taking Active   Insulin Pen Needle (B-D ULTRAFINE III  SHORT PEN) 31G X 8 MM MISC [id number] Yes USE FOR DAILY INSULIN INJECTION AS DIRECTED BY PHYSICIAN Hali Marry, MD Taking Active   losartan (COZAAR) 50 MG tablet [id number] Yes TAKE 1 TABLET BY MOUTH EVERY DAY Hali Marry, MD Taking Active   meloxicam (MOBIC) 7.5 MG tablet [id number] Yes Take 1 tablet by mouth 2 (two) times daily. Hinda Lenis, MD Taking Active   metFORMIN (GLUCOPHAGE) 1000 MG tablet [id number] Yes TAKE 1 TABLET (1,000 MG TOTAL) BY MOUTH 2 (TWO) TIMES DAILY WITH A MEAL. Hali Marry, MD Taking Active   metoprolol tartrate (LOPRESSOR) 50 MG tablet [id number] Yes TAKE 1/2 TABLET BY MOUTH TWICE A DAY Hali Marry, MD Taking Active   omeprazole (PRILOSEC) 40 MG capsule [id number] Yes TAKE 1 CAPSULE BY MOUTH EVERY DAY Hali Marry, MD Taking Active   pravastatin (PRAVACHOL) 20 MG tablet [id number] Yes TAKE 1 TABLET BY MOUTH TWICE PER WEEK AT BEDTIME. Hali Marry, MD Taking Active   tiZANidine (ZANAFLEX) 4 MG tablet [id number] Yes TAKE 1 TABLET BY MOUTH TWICE A DAY AS NEEDED FOR MUSCLE SPASM Hali Marry, MD Taking Active   traZODone (DESYREL) 150 MG tablet [id number] Yes TAKE 1 TABLET BY MOUTH EVERYDAY AT BEDTIME Hali Marry, MD Taking Active             Patient Active Problem List   Diagnosis Date Noted   Fall  at home, initial encounter 09/26/2019   Lumbar stenosis with neurogenic claudication 09/25/2017   Primary osteoarthritis of both knees 03/10/2017   Aortic atherosclerosis ([place]) 08/13/2016   Chronic constipation 10/27/2014   Right shoulder pain 09/11/2014   Combined forms of age-related cataract of left eye 05/25/2014   Asthma with COPD (chronic obstructive pulmonary disease) ([place]) 09/16/2012   Restrictive lung disease 08/19/2012   AR (allergic rhinitis) 07/21/2012   Former smoker 07/21/2012   Erectile dysfunction associated with type 2 diabetes mellitus ([place]) 03/15/2012   Anomalous aortic origin of coronary artery 09/12/2010   Proteinuria 08/06/2010   Insomnia 05/14/2010   Spinal stenosis of lumbar region 05/01/2010   Well controlled type 2 diabetes mellitus with peripheral neuropathy ([place]) 04/22/2010   Hyperlipidemia 04/22/2010   Diabetic peripheral neuropathy ([place]) 04/22/2010   ESSENTIAL HYPERTENSION, BENIGN 04/22/2010    Immunization History  Administered Date(s) Administered   Fluad Quad(high Dose 65+) 12/22/2018, 01/10/2020   Influenza, High Dose Seasonal PF 01/08/2017, 01/20/2018   Influenza,inj,Quad PF,6+ Mos 01/21/2013, 01/10/2014, 12/28/2014, 01/10/2016   Moderna Sars-Covid-2 Vaccination 07/25/2019, 08/22/2019   PNEUMOCOCCAL CONJUGATE-20 07/11/2020   Pneumococcal Polysaccharide-23 08/19/2012   Tdap 06/26/2015    Conditions to be addressed/monitored: HTN, HLD, and DMII  Care Plan : Medication Management  Updates made by Darius Bump, [place] since 12/05/2020 12:00 AM     Problem: DM, HTN, HLD      Long-Range Goal: Disease Progression Prevention   Start Date: 11/26/2020  Recent Progress: On track  Priority: High  Note:   Current Barriers:  Unable to achieve control of diabetes without adverse hypoglycemia   Pharmacist Clinical Goal(s):  Over the next 30 days, patient will verbalize ability to afford treatment regimen achieve control of diabetes as evidenced by  a1c control, medication changes through collaboration with PharmD and provider.   Interventions: 1:1 collaboration with Hali Marry, MD regarding development and update of comprehensive plan of care as evidenced by provider attestation and co-signature Inter-disciplinary care team collaboration (see longitudinal plan of care) Comprehensive medication review performed; medication list  updated in electronic medical record  Diabetes:  Uncontrolled; current treatment:toujeo 26 units daily, metformin 1g BID; a1c 7.4  Current glucose readings:t 150-170s, sometimes 200s  Denies hypoglycemic/hyperglycemic symptoms Current meal patterns: breakfast: skips; lunch: liverworst, bread ; dinner: chicken, green beans, corn, bread; snacks: does not snack; drinks: water, few sips of diet coke states "not much"  Current exercise: sit ups, push ups  Educated on risks of hypoglycemia,  Assessed patient finances. Will initiate patient assistance for GLP1 for ASCVD benefit, also with goal of a1c control while reducing insulin to avoid hypoglycemia Hypertension:  Controlled; current treatment:losartan 53m daily, metoprolol 23mBID;   Current home readings: to be discussed at future visits  Denies hypotensive/hypertensive symptoms  Recommended continue current regimen, and  Hyperlipidemia:  Controlled; current treatment:pravastatin 2067maily;   Recommended continue current regimen  Patient Goals/Self-Care Activities Over the next 30 days, patient will:  take medications as prescribed  Follow Up Plan: Face to Face appointment with care management team member scheduled for: 1 month     Medication Assistance: Application for Novonordisk  medication assistance program. in process.  Anticipated assistance start date TBD.  See plan of care for additional detail.  Patient[facility] [address]: [phone]: [phone] [place]: [phone]: [phone] pill box? Yes Pt endorses 100% compliance  Follow Up:  Patient agrees to Care Plan and Follow-up.  Plan: Face to Face appointment with care management team member scheduled for: 1 month  KeeDarius Bump

## 2020-12-24 ENCOUNTER — Ambulatory Visit: Payer: Medicare HMO | Admitting: Family Medicine

## 2021-01-01 DIAGNOSIS — I10 Essential (primary) hypertension: Secondary | ICD-10-CM | POA: Diagnosis not present

## 2021-01-01 DIAGNOSIS — G8911 Acute pain due to trauma: Secondary | ICD-10-CM | POA: Diagnosis not present

## 2021-01-01 DIAGNOSIS — Z888 Allergy status to other drugs, medicaments and biological substances status: Secondary | ICD-10-CM | POA: Diagnosis not present

## 2021-01-01 DIAGNOSIS — Z7982 Long term (current) use of aspirin: Secondary | ICD-10-CM | POA: Diagnosis not present

## 2021-01-01 DIAGNOSIS — Z87891 Personal history of nicotine dependence: Secondary | ICD-10-CM | POA: Diagnosis not present

## 2021-01-01 DIAGNOSIS — R0781 Pleurodynia: Secondary | ICD-10-CM | POA: Diagnosis not present

## 2021-01-01 DIAGNOSIS — M25512 Pain in left shoulder: Secondary | ICD-10-CM | POA: Diagnosis not present

## 2021-01-01 DIAGNOSIS — K219 Gastro-esophageal reflux disease without esophagitis: Secondary | ICD-10-CM | POA: Diagnosis not present

## 2021-01-01 DIAGNOSIS — E119 Type 2 diabetes mellitus without complications: Secondary | ICD-10-CM | POA: Diagnosis not present

## 2021-01-01 DIAGNOSIS — S0001XA Abrasion of scalp, initial encounter: Secondary | ICD-10-CM | POA: Diagnosis not present

## 2021-01-01 DIAGNOSIS — M545 Low back pain, unspecified: Secondary | ICD-10-CM | POA: Diagnosis not present

## 2021-01-07 ENCOUNTER — Ambulatory Visit: Payer: Medicare HMO | Admitting: Family Medicine

## 2021-01-08 NOTE — Progress Notes (Deleted)
Chronic Care Management Pharmacy Note  01/08/2021 Name:  Justin Goodman MRN:  [id number] DOB:  1955-09-26  Summary: addressed HTN, HLD, primarily DM. A1c 7.4 still not quite at goal but in past with higher insulin dose, patient reports hypoglycemia & passing out.   Recommendations/Changes made from today's visit: Assessed patient finances. Will initiate patient assistance for GLP1 for ASCVD benefit, also with goal of a1c control while reducing insulin to avoid hypoglycemia. Provided patient a copy of novonordisk patient assistance paperwork to fill out and provide at next visit.  Plan: f/u with pharmacist in 1 month (pt going on 3 week trip to [place] so unable to schedule sooner)  Subjective: Justin Goodman is an 65 y.o. year old male who is a primary patient of Metheney, Rene Kocher, MD.  The CCM team was consulted for assistance with disease management and care coordination needs.    Engaged with patient face to face for follow up visit in response to provider referral for pharmacy case management and/or care coordination services.   Consent to Services:  The patient was given information about Chronic Care Management services, agreed to services, and gave verbal consent prior to initiation of services.  Please see initial visit note for detailed documentation.   Patient Care Team: Hali Marry, MD as PCP - General (Family Medicine) Darius Bump, [facility] as Pharmacist (Pharmacist)  Recent office visits:  10/01/20 Beatrice Lecher MD(PCP)- Patient was seen for well controlled Diabetes with peripheral neuropathy. Patient was started on Gabapentin 1200 MG TID. Hemoglobin labs were placed. Patient had ear irrigation. Patient advised to come back for nurse visit in 2 weeks to recheck.  Adjust the metoprolol if needed.   08/08/20 Iran Planas PA-C- Patient was seen for Annual wellness exam. Follow-up with Hali Marry, MD as planned.   Recent consult visits:  None noted    Hospital visits:  None in previous 6 months  Objective:  Lab Results  Component Value Date   CREATININE 0.73 06/26/2020   CREATININE 0.78 03/28/2020   CREATININE 0.65 (L) 09/27/2019    Lab Results  Component Value Date   HGBA1C 7.4 (A) 10/01/2020   Last diabetic Eye exam:  Lab Results  Component Value Date/Time   HMDIABEYEEXA Retinopathy (A) 08/03/2020 12:00 AM    Last diabetic Foot exam: No results found for: HMDIABFOOTEX      Component Value Date/Time   CHOL 152 06/26/2020 0810   TRIG 156 (H) 06/26/2020 0810   HDL 32 (L) 06/26/2020 0810   CHOLHDL 4.8 06/26/2020 0810   VLDL 62 (H) 01/15/2016 0806   LDLCALC 95 06/26/2020 0810   LDLDIRECT 97 09/21/2007 2129    Hepatic Function Latest Ref Rng & Units 03/28/2020 09/27/2019 06/27/2019  Total Protein 6.1 - 8.1 g/dL 7.7 7.2 7.6  Albumin 3.6 - 5.1 g/dL - - -  AST 10 - 35 U/L 15 19 19   ALT 9 - 46 U/L 14 18 13   Alk Phosphatase 40 - 115 U/L - - -  Total Bilirubin 0.2 - 1.2 mg/dL 0.4 0.4 0.5  Bilirubin, Direct <=0.2 mg/dL - - -    Lab Results  Component Value Date/Time   TSH 2.11 08/12/2016 01:58 PM   TSH 2.31 01/15/2016 08:06 AM   FREET4 1.2 08/12/2016 01:58 PM    CBC Latest Ref Rng & Units 03/28/2020 09/27/2019 01/06/2018  WBC 3.8 - 10.8 Thousand/uL 3.3(L) 4.2 3.0(L)  Hemoglobin 13.2 - 17.1 g/dL 14.0 13.1(L) 13.9  Hematocrit 38.5 - 50.0 %  41.1 38.7 42.0  Platelets 140 - 400 Thousand/uL 169 193 170    Clinical ASCVD: No  The 10-year ASCVD risk score (Arnett DK, et al., 2019) is: 33.1%   Values used to calculate the score:     Age: 42 years     Sex: Male     Is Non-Hispanic African American: No     Diabetic: Yes     Tobacco smoker: No     Systolic Blood Pressure: 948 mmHg     Is BP treated: Yes     HDL Cholesterol: 32 mg/dL     Total Cholesterol: 152 mg/dL     Social History   Tobacco Use  Smoking Status Former   Packs/day: 1.00   Years: 20.00   Pack years: 20.00   Types: Cigarettes   Quit date: 2013    Years since quitting: 9.7  Smokeless Tobacco Never   BP Readings from Last 3 Encounters:  10/01/20 (!) 105/57  06/25/20 130/61  03/27/20 (!) 132/58   Pulse Readings from Last 3 Encounters:  10/01/20 86  06/25/20 78  03/27/20 87   Wt Readings from Last 3 Encounters:  10/01/20 159 lb (72.1 kg)  06/25/20 162 lb (73.5 kg)  03/27/20 163 lb (73.9 kg)    Assessment: Review of patient past medical history, allergies, medications, health status, including review of consultants reports, laboratory and other test data, was performed as part of comprehensive evaluation and provision of chronic care management services.   SDOH:  (Social Determinants of Health) assessments and interventions performed:    CCM Care Plan  Allergies  Allergen Reactions   Amaryl [Glimepiride] Other (See Comments)    Frequent hypoglycemia   Atorvastatin Other (See Comments)    HA    Cymbalta [Duloxetine Hcl] Other (See Comments)    Dizziness, insomnia   Glipizide Other (See Comments)    HA   Losartan Other (See Comments)    Vision changes, HA.    Lyrica [Pregabalin] Other (See Comments)    Sedation   Quinapril Cough   Simvastatin Other (See Comments)    Severe myalgias.      Medications Reviewed Today     Reviewed by Darius Bump, [facility] (Pharmacist) on 12/05/20 at 71  Med List Status: <None>   Medication Order Taking? Sig Documenting Provider Last Dose Status Informant  ACCU-CHEK GUIDE test strip [id number] Yes USE TO CHECK BLOOD SUGAR UP TO 4 TIMES A DAY AS DIRECTED Hali Marry, MD Taking Active   Alcohol Swabs (B-D SINGLE USE SWABS REGULAR) PADS [id number] Yes Check blood glucose 4 times daily Diagnosis diabetes E11.40 Hali Marry, MD Taking Active   amitriptyline (ELAVIL) 75 MG tablet [id number] Yes TAKE 1 TABLET (75 MG TOTAL) BY MOUTH AT BEDTIME AS NEEDED FOR SLEEP. Hali Marry, MD Taking Active   blood glucose meter kit and supplies [id number] Yes Dispense based  on patient and insurance preference. Use up to four times daily as directed. Dx: E11.9 Hali Marry, MD Taking Active   gabapentin (NEURONTIN) 600 MG tablet [id number] Yes Take 2 tablets (1,200 mg total) by mouth 3 (three) times daily. Hali Marry, MD Taking Active   insulin glargine, 1 Unit Dial, (TOUJEO SOLOSTAR) 300 UNIT/ML Solostar Pen [id number] Yes INJECT 25 UNITS SUBCUTANEOUSLY ONCE DAILY  Patient taking differently: Inject 26 Units into the skin daily. INJECT 26 UNITS SUBCUTANEOUSLY ONCE DAILY   Hali Marry, MD Taking Active   Insulin Pen Needle (B-D ULTRAFINE III SHORT  PEN) 31G X 8 MM MISC [id number] Yes USE FOR DAILY INSULIN INJECTION AS DIRECTED BY PHYSICIAN Hali Marry, MD Taking Active   losartan (COZAAR) 50 MG tablet [id number] Yes TAKE 1 TABLET BY MOUTH EVERY DAY Hali Marry, MD Taking Active   meloxicam (MOBIC) 7.5 MG tablet [id number] Yes Take 1 tablet by mouth 2 (two) times daily. Hinda Lenis, MD Taking Active   metFORMIN (GLUCOPHAGE) 1000 MG tablet [id number] Yes TAKE 1 TABLET (1,000 MG TOTAL) BY MOUTH 2 (TWO) TIMES DAILY WITH A MEAL. Hali Marry, MD Taking Active   metoprolol tartrate (LOPRESSOR) 50 MG tablet [id number] Yes TAKE 1/2 TABLET BY MOUTH TWICE A DAY Hali Marry, MD Taking Active   omeprazole (PRILOSEC) 40 MG capsule [id number] Yes TAKE 1 CAPSULE BY MOUTH EVERY DAY Hali Marry, MD Taking Active   pravastatin (PRAVACHOL) 20 MG tablet [id number] Yes TAKE 1 TABLET BY MOUTH TWICE PER WEEK AT BEDTIME. Hali Marry, MD Taking Active   tiZANidine (ZANAFLEX) 4 MG tablet [id number] Yes TAKE 1 TABLET BY MOUTH TWICE A DAY AS NEEDED FOR MUSCLE SPASM Hali Marry, MD Taking Active   traZODone (DESYREL) 150 MG tablet [id number] Yes TAKE 1 TABLET BY MOUTH EVERYDAY AT BEDTIME Hali Marry, MD Taking Active             Patient Active Problem List   Diagnosis Date Noted   Fall at  home, initial encounter 09/26/2019   Lumbar stenosis with neurogenic claudication 09/25/2017   Primary osteoarthritis of both knees 03/10/2017   Aortic atherosclerosis ([place]) 08/13/2016   Chronic constipation 10/27/2014   Right shoulder pain 09/11/2014   Combined forms of age-related cataract of left eye 05/25/2014   Asthma with COPD (chronic obstructive pulmonary disease) ([place]) 09/16/2012   Restrictive lung disease 08/19/2012   AR (allergic rhinitis) 07/21/2012   Former smoker 07/21/2012   Erectile dysfunction associated with type 2 diabetes mellitus ([place]) 03/15/2012   Anomalous aortic origin of coronary artery 09/12/2010   Proteinuria 08/06/2010   Insomnia 05/14/2010   Spinal stenosis of lumbar region 05/01/2010   Well controlled type 2 diabetes mellitus with peripheral neuropathy ([place]) 04/22/2010   Hyperlipidemia 04/22/2010   Diabetic peripheral neuropathy ([place]) 04/22/2010   ESSENTIAL HYPERTENSION, BENIGN 04/22/2010    Immunization History  Administered Date(s) Administered   Fluad Quad(high Dose 65+) 12/22/2018, 01/10/2020   Influenza, High Dose Seasonal PF 01/08/2017, 01/20/2018   Influenza,inj,Quad PF,6+ Mos 01/21/2013, 01/10/2014, 12/28/2014, 01/10/2016   Moderna Sars-Covid-2 Vaccination 07/25/2019, 08/22/2019   PNEUMOCOCCAL CONJUGATE-20 07/11/2020   Pneumococcal Polysaccharide-23 08/19/2012   Tdap 06/26/2015    Conditions to be addressed/monitored: HTN, HLD, and DMII  There are no care plans that you recently modified to display for this patient.   Medication Assistance: Application for Novonordisk  medication assistance program. in process.  Anticipated assistance start date TBD.  See plan of care for additional detail.  Patient's preferred pharmacy is:  [facility], [place] [address] Phone: [phone] Fax: [phone]  [facility] [place] [address] [place] [address] Phone: [phone] Fax: [phone]  Uses pill box? Yes Pt endorses 100% compliance  Follow Up:  Patient agrees to Care Plan and Follow-up.  Plan: Face to Face appointment with care management team member scheduled for: 1 month  Darius Bump

## 2021-01-09 ENCOUNTER — Ambulatory Visit: Payer: Medicare HMO

## 2021-01-09 ENCOUNTER — Telehealth: Payer: Self-pay | Admitting: Pharmacist

## 2021-01-09 NOTE — Telephone Encounter (Signed)
Patient did not arrive to clinic for follow-up visit for CCM services with pharmacist.  Will route to schedule team for reschedule.

## 2021-01-09 NOTE — Progress Notes (Signed)
Spoke with pt who declined to reschedule at this time says he has found assistance for his medications but wouldn't elaborate

## 2021-01-10 ENCOUNTER — Other Ambulatory Visit: Payer: Self-pay | Admitting: Family Medicine

## 2021-01-16 ENCOUNTER — Encounter: Payer: Self-pay | Admitting: Family Medicine

## 2021-01-16 ENCOUNTER — Ambulatory Visit (INDEPENDENT_AMBULATORY_CARE_PROVIDER_SITE_OTHER): Payer: Medicare HMO | Admitting: Family Medicine

## 2021-01-16 VITALS — BP 110/60 | HR 81 | Ht 66.0 in | Wt 161.0 lb

## 2021-01-16 DIAGNOSIS — I10 Essential (primary) hypertension: Secondary | ICD-10-CM

## 2021-01-16 DIAGNOSIS — Z23 Encounter for immunization: Secondary | ICD-10-CM

## 2021-01-16 DIAGNOSIS — E538 Deficiency of other specified B group vitamins: Secondary | ICD-10-CM | POA: Diagnosis not present

## 2021-01-16 DIAGNOSIS — J4541 Moderate persistent asthma with (acute) exacerbation: Secondary | ICD-10-CM

## 2021-01-16 DIAGNOSIS — E1142 Type 2 diabetes mellitus with diabetic polyneuropathy: Secondary | ICD-10-CM

## 2021-01-16 LAB — POCT GLYCOSYLATED HEMOGLOBIN (HGB A1C): Hemoglobin A1C: 9 % — AB (ref 4.0–5.6)

## 2021-01-16 MED ORDER — DOXYCYCLINE HYCLATE 100 MG PO TABS: 100.0000 mg | ORAL_TABLET | Freq: Two times a day (BID) | ORAL | 0 refills | Status: DC

## 2021-01-16 MED ORDER — LOSARTAN POTASSIUM 25 MG PO TABS: 25.0000 mg | ORAL_TABLET | Freq: Every day | ORAL | 0 refills | Status: DC

## 2021-01-16 MED ORDER — PREDNISONE 20 MG PO TABS: 40.0000 mg | ORAL_TABLET | Freq: Every day | ORAL | 0 refills | Status: DC

## 2021-01-16 NOTE — Assessment & Plan Note (Signed)
Blood pressures actually little bit low today and it was actually low last time he was here in June some good to have him split his losartan in half and take 25 mg we had actually just refilled it about a week ago.  In the interim I did send in a prescription for 25's to take to see if this is helpful.

## 2021-01-16 NOTE — Assessment & Plan Note (Signed)
A1c significantly elevated at 9.0.  He has not been this high in quite a long time.  We discussed trying to get back on track with diet.  He is on a track his blood sugars over the next couple of weeks and let me know how they are doing so that we can make an adjustment to his regimen I do think some of this was dietary discretion while he was traveling.  But it may be that he is more persistently elevated as well.  Consider the addition of Trulicity which could help reduce his insulin use.  Unfortunately cost may be an issue since he is now on Medicare but we certainly could consider that.  Tinea with metformin.

## 2021-01-16 NOTE — Progress Notes (Signed)
Established Patient Office Visit  Subjective:  Patient ID: Justin Goodman, male    DOB: 11/29/1955  Age: 65 y.o. MRN: [id number]  CC:  Chief Complaint  Patient presents with   Hypertension   Diabetes    HPI Ibrohim Simmers presents for   Hypertension- Pt denies chest pain, SOB, dizziness, or heart palpitations.  Taking meds as directed w/o problems.  Denies medication side effects.    Diabetes - no hypoglycemic events. No wounds or sores that are not healing well. No increased thirst or urination. Checking glucose at home. Taking medications as prescribed without any side effects.  He went to [place] for about 3 weeks and says that he pretty much ate what he wanted to.  He knows his blood sugars are gone to be quite elevated.  Since being home blood sugars have been running in the low 200s.  He is currently using 26 units of long-acting insulin.  Also complains of a cough for about the last month.  He says he feels like there is congestion in his upper chest but cannot quite move it out.  No fevers chills or sweats.  No sore throat or ear pain he says it did not start with any additional cold symptoms he has been wheezing and says he has been using his inhaler daily.  Past Medical History:  Diagnosis Date   Chronic back pain    only mild loss of T12-L1 disk ht on xray 1-12   COPD (chronic obstructive pulmonary disease) (HCC)    Diabetes mellitus    w/ neuropathy   Hypertension    Smoker     Past Surgical History:  Procedure Laterality Date   LUMBAR LAMINECTOMY/DECOMPRESSION MICRODISCECTOMY N/A 09/25/2017   Procedure: LAMINECTOMY LUMBAR FOUR- LUMBAR FIVE;  Surgeon: Ashok Pall, MD;  Location: [facility];  Service: Neurosurgery;  Laterality: N/A;   UVULOPALATOPHARYNGOPLASTY     2001    Family History  Problem Relation Age of Onset   Diabetes Mother    Alcohol abuse Father        Liver disease killed father @ age 65.   Alcohol abuse Brother    Heart attack Brother         Died at age 38.    Social History   Socioeconomic History   Marital status: Single    Spouse name: Not on file   Number of children: 0   Years of education: 12th grade   Highest education level: High school graduate  Occupational History    Comment: Retired  Tobacco Use   Smoking status: Former    Packs/day: 1.00    Years: 20.00    Pack years: 20.00    Types: Cigarettes    Quit date: 2013    Years since quitting: 9.7   Smokeless tobacco: Never  Vaping Use   Vaping Use: Never used  Substance and Sexual Activity   Alcohol use: No   Drug use: No   Sexual activity: Yes  Other Topics Concern   Not on file  Social History Narrative   Lives alone. Due to his neuropathy, he is not able to do much exercise. He enjoys watching t.v. in his free time.   Social Determinants of Health   Financial Resource Strain: Low Risk    Difficulty of Paying Living Expenses: Not hard at all  Food Insecurity: No Food Insecurity   Worried About Charity fundraiser in the Last Year: Never true   [employer]  in the Last Year: Never true  Transportation Needs: No Transportation Needs   Lack of Transportation (Medical): No   Lack of Transportation (Non-Medical): No  Physical Activity: Inactive   Days of Exercise per Week: 0 days   Minutes of Exercise per Session: 0 min  Stress: No Stress Concern Present   Feeling of Stress : Not at all  Social Connections: Socially Isolated   Frequency of Communication with Friends and Family: Once a week   Frequency of Social Gatherings with Friends and Family: Never   Attends Religious Services: More than 4 times per year   Active Member of [employer] or Organizations: No   Attends Music therapist: Never   Marital Status: Divorced  Human resources officer Violence: Not At Risk   Fear of Current or Ex-Partner: No   Emotionally Abused: No   Physically Abused: No   Sexually Abused: No    Outpatient Medications Prior to Visit  Medication Sig  Dispense Refill   ACCU-CHEK GUIDE test strip USE TO CHECK BLOOD SUGAR UP TO 4 TIMES A DAY AS DIRECTED 300 strip 6   Alcohol Swabs (B-D SINGLE USE SWABS REGULAR) PADS Check blood glucose 4 times daily Diagnosis diabetes E11.40 600 each 3   amitriptyline (ELAVIL) 75 MG tablet TAKE 1 TABLET (75 MG TOTAL) BY MOUTH AT BEDTIME AS NEEDED FOR SLEEP. 90 tablet 1   blood glucose meter kit and supplies Dispense based on patient and insurance preference. Use up to four times daily as directed. Dx: E11.9 1 each 0   gabapentin (NEURONTIN) 600 MG tablet Take 2 tablets (1,200 mg total) by mouth 3 (three) times daily. 540 tablet 0   insulin glargine, 1 Unit Dial, (TOUJEO SOLOSTAR) 300 UNIT/ML Solostar Pen INJECT 25 UNITS SUBCUTANEOUSLY ONCE DAILY (Patient taking differently: Inject 26 Units into the skin daily. INJECT 26 UNITS SUBCUTANEOUSLY ONCE DAILY) 4.5 mL 2   Insulin Pen Needle (B-D ULTRAFINE III SHORT PEN) 31G X 8 MM MISC USE FOR DAILY INSULIN INJECTION AS DIRECTED BY PHYSICIAN 100 each 4   meloxicam (MOBIC) 7.5 MG tablet Take 1 tablet by mouth 2 (two) times daily.     metFORMIN (GLUCOPHAGE) 1000 MG tablet TAKE 1 TABLET (1,000 MG TOTAL) BY MOUTH 2 (TWO) TIMES DAILY WITH A MEAL. 180 tablet 3   metoprolol tartrate (LOPRESSOR) 50 MG tablet TAKE 1/2 TABLET BY MOUTH TWICE A DAY 90 tablet 1   omeprazole (PRILOSEC) 40 MG capsule TAKE 1 CAPSULE BY MOUTH EVERY DAY 90 capsule 3   pravastatin (PRAVACHOL) 20 MG tablet TAKE 1 TABLET BY MOUTH TWICE PER WEEK AT BEDTIME. 24 tablet 3   tiZANidine (ZANAFLEX) 4 MG tablet TAKE 1 TABLET BY MOUTH TWICE A DAY AS NEEDED FOR MUSCLE SPASM 60 tablet 2   traZODone (DESYREL) 150 MG tablet TAKE 1 TABLET BY MOUTH EVERYDAY AT BEDTIME 90 tablet 2   losartan (COZAAR) 50 MG tablet TAKE 1 TABLET BY MOUTH EVERY DAY 90 tablet 1   No facility-administered medications prior to visit.    Allergies  Allergen Reactions   Amaryl [Glimepiride] Other (See Comments)    Frequent hypoglycemia    Atorvastatin Other (See Comments)    HA    Cymbalta [Duloxetine Hcl] Other (See Comments)    Dizziness, insomnia   Glipizide Other (See Comments)    HA   Losartan Other (See Comments)    Vision changes, HA.    Lyrica [Pregabalin] Other (See Comments)    Sedation   Quinapril Cough  Simvastatin Other (See Comments)    Severe myalgias.      ROS Review of Systems    Objective:    Physical Exam Constitutional:      Appearance: Normal appearance. He is well-developed.  HENT:     Head: Normocephalic and atraumatic.     Right Ear: Tympanic membrane, ear canal and external ear normal.     Left Ear: Tympanic membrane, ear canal and external ear normal.     Nose: Nose normal.     Mouth/Throat:     Mouth: Mucous membranes are moist.     Pharynx: Oropharynx is clear. No posterior oropharyngeal erythema.  Eyes:     Conjunctiva/sclera: Conjunctivae normal.     Pupils: Pupils are equal, round, and reactive to light.  Neck:     Thyroid: No thyromegaly.  Cardiovascular:     Rate and Rhythm: Normal rate and regular rhythm.     Heart sounds: Normal heart sounds.  Pulmonary:     Effort: Pulmonary effort is normal.     Breath sounds: Normal breath sounds.     Comments: Prolonged expiration bilaterally. Musculoskeletal:     Cervical back: Neck supple.  Lymphadenopathy:     Cervical: No cervical adenopathy.  Skin:    General: Skin is warm and dry.  Neurological:     Mental Status: He is alert and oriented to person, place, and time. Mental status is at baseline.  Psychiatric:        Mood and Affect: Mood normal.        Behavior: Behavior normal.    BP 110/60   Pulse 81   Ht _0  (1.676 m)   Wt 161 lb (73 kg)   SpO2 100%   BMI 25.99 kg/m  Wt Readings from Last 3 Encounters:  01/16/21 161 lb (73 kg)  10/01/20 159 lb (72.1 kg)  06/25/20 162 lb (73.5 kg)     Health Maintenance Due  Topic Date Due   Zoster Vaccines- Shingrix (1 of 2) Never done   COVID-19 Vaccine (3 -  Booster for Moderna series) 01/22/2020    There are no preventive care reminders to display for this patient.  Lab Results  Component Value Date   TSH 2.11 08/12/2016   Lab Results  Component Value Date   WBC 3.3 (L) 03/28/2020   HGB 14.0 03/28/2020   HCT 41.1 03/28/2020   MCV 86.9 03/28/2020   PLT 169 03/28/2020   Lab Results  Component Value Date   NA 138 06/26/2020   K 4.4 06/26/2020   CO2 29 06/26/2020   GLUCOSE 269 (H) 06/26/2020   BUN 20 06/26/2020   CREATININE 0.73 06/26/2020   BILITOT 0.4 03/28/2020   ALKPHOS 51 08/12/2016   AST 15 03/28/2020   ALT 14 03/28/2020   PROT 7.7 03/28/2020   ALBUMIN 4.5 08/12/2016   CALCIUM 9.5 06/26/2020   ANIONGAP 5 09/18/2017   Lab Results  Component Value Date   CHOL 152 06/26/2020   Lab Results  Component Value Date   HDL 32 (L) 06/26/2020   Lab Results  Component Value Date   LDLCALC 95 06/26/2020   Lab Results  Component Value Date   TRIG 156 (H) 06/26/2020   Lab Results  Component Value Date   CHOLHDL 4.8 06/26/2020   Lab Results  Component Value Date   HGBA1C 5.8 (A) 01/16/2021      Assessment & Plan:   Problem List Items Addressed This Visit  Cardiovascular and Mediastinum   ESSENTIAL HYPERTENSION, BENIGN - Primary    Blood pressures actually little bit low today and it was actually low last time he was here in June some good to have him split his losartan in half and take 25 mg we had actually just refilled it about a week ago.  In the interim I did send in a prescription for 25's to take to see if this is helpful.      Relevant Medications   losartan (COZAAR) 25 MG tablet   Other Relevant Orders   COMPLETE METABOLIC PANEL WITH GFR     Endocrine   Well controlled type 2 diabetes mellitus with peripheral neuropathy (HCC)    A1c significantly elevated at 9.0.  He has not been this high in quite a long time.  We discussed trying to get back on track with diet.  He is on a track his blood  sugars over the next couple of weeks and let me know how they are doing so that we can make an adjustment to his regimen I do think some of this was dietary discretion while he was traveling.  But it may be that he is more persistently elevated as well.  Consider the addition of Trulicity which could help reduce his insulin use.  Unfortunately cost may be an issue since he is now on Medicare but we certainly could consider that.  Tinea with metformin.      Relevant Medications   losartan (COZAAR) 25 MG tablet   Other Relevant Orders   COMPLETE METABOLIC PANEL WITH GFR   POCT glycosylated hemoglobin (Hb A1C) (Completed)   Diabetic peripheral neuropathy (HCC)   Relevant Medications   losartan (COZAAR) 25 MG tablet   Other Relevant Orders   B12   Other Visit Diagnoses     Moderate persistent asthma with exacerbation       Relevant Medications   predniSONE (DELTASONE) 20 MG tablet   Need for immunization against influenza       Relevant Orders   Flu Vaccine QUAD High Dose(Fluad) (Completed)      Asthma/COPD exacerbation-we will treat with doxycycline and prednisone did discuss that the prednisone will likely increase his blood sugar for about 5 days.  Try to eat as healthy as he can during that time and stay well-hydrated.  If his cough is not improving over the next week then I did encourage him to give [name] a call back so we can consider chest x-ray especially since he is already been coughing for a month at this point.   Meds ordered this encounter  Medications   losartan (COZAAR) 25 MG tablet    Sig: Take 1 tablet (25 mg total) by mouth daily.    Dispense:  90 tablet    Refill:  0    Please d/c the 63m tabs   doxycycline (VIBRA-TABS) 100 MG tablet    Sig: Take 1 tablet (100 mg total) by mouth 2 (two) times daily.    Dispense:  20 tablet    Refill:  0   predniSONE (DELTASONE) 20 MG tablet    Sig: Take 2 tablets (40 mg total) by mouth daily with breakfast.    Dispense:  10  tablet    Refill:  0     Follow-up: Return in about 6 weeks (around 02/27/2021) for Diabetes follow-up.    CBeatrice Lecher MD

## 2021-01-17 LAB — COMPLETE METABOLIC PANEL WITH GFR
AG Ratio: 1.4 (calc) (ref 1.0–2.5)
ALT: 8 U/L — ABNORMAL LOW (ref 9–46)
AST: 13 U/L (ref 10–35)
Albumin: 4.4 g/dL (ref 3.6–5.1)
Alkaline phosphatase (APISO): 77 U/L (ref 35–144)
BUN: 15 mg/dL (ref 7–25)
CO2: 30 mmol/L (ref 20–32)
Calcium: 10.2 mg/dL (ref 8.6–10.3)
Chloride: 100 mmol/L (ref 98–110)
Creat: 0.7 mg/dL (ref 0.70–1.35)
Globulin: 3.1 g/dL (calc) (ref 1.9–3.7)
Glucose, Bld: 149 mg/dL — ABNORMAL HIGH (ref 65–99)
Potassium: 5.2 mmol/L (ref 3.5–5.3)
Sodium: 136 mmol/L (ref 135–146)
Total Bilirubin: 0.4 mg/dL (ref 0.2–1.2)
Total Protein: 7.5 g/dL (ref 6.1–8.1)
eGFR: 101 mL/min/{1.73_m2} (ref >=60)

## 2021-01-17 LAB — VITAMIN B12: Vitamin B-12: 416 pg/mL (ref 200–1100)

## 2021-01-18 NOTE — Progress Notes (Signed)
Your lab work is within acceptable range and there are no concerning findings.   ?

## 2021-01-22 DIAGNOSIS — E1142 Type 2 diabetes mellitus with diabetic polyneuropathy: Secondary | ICD-10-CM | POA: Diagnosis not present

## 2021-01-22 DIAGNOSIS — L602 Onychogryphosis: Secondary | ICD-10-CM | POA: Diagnosis not present

## 2021-01-28 ENCOUNTER — Telehealth: Payer: Self-pay | Admitting: Family Medicine

## 2021-01-28 DIAGNOSIS — R052 Subacute cough: Secondary | ICD-10-CM

## 2021-01-28 DIAGNOSIS — J4541 Moderate persistent asthma with (acute) exacerbation: Secondary | ICD-10-CM

## 2021-01-28 NOTE — Telephone Encounter (Signed)
Pt called.  Pt states he was told to call if med was not working for his chest.  Thank you

## 2021-01-28 NOTE — Telephone Encounter (Signed)
Spoke with pt who states that his cough is not better and that he feels like there is congestion in his lungs, but he can't cough it up.  Pt denies fever, chills, N&V, ST, otalgia, CP, SOB, arm/neck pain, diaphresis.  Last OV note advised pt to call back if no improvement in cough for possible CXR.  Please advise.  Tiajuana Amass, CMA

## 2021-01-29 MED ORDER — AMOXICILLIN-POT CLAVULANATE 875-125 MG PO TABS: 1.0000 | ORAL_TABLET | Freq: Two times a day (BID) | ORAL | 0 refills | Status: DC

## 2021-01-29 NOTE — Telephone Encounter (Signed)
Pt informed.  Pt expressed understanding and is agreeable.  T. Jovahn Breit, CMA  

## 2021-01-29 NOTE — Telephone Encounter (Signed)
Spoke with pt who stated that he does not have a car right now and has no way to get a CXR.  Please advise.  Tiajuana Amass, CMA

## 2021-01-29 NOTE — Telephone Encounter (Signed)
K, new antibiotics sent to pharmacy but if he still does not improve then he really needs to get a chest x-ray.

## 2021-02-15 ENCOUNTER — Other Ambulatory Visit: Payer: Self-pay | Admitting: Family Medicine

## 2021-02-15 DIAGNOSIS — E1142 Type 2 diabetes mellitus with diabetic polyneuropathy: Secondary | ICD-10-CM

## 2021-02-27 ENCOUNTER — Other Ambulatory Visit: Payer: Self-pay

## 2021-02-27 ENCOUNTER — Ambulatory Visit (INDEPENDENT_AMBULATORY_CARE_PROVIDER_SITE_OTHER): Payer: Medicare HMO

## 2021-02-27 ENCOUNTER — Encounter: Payer: Self-pay | Admitting: Family Medicine

## 2021-02-27 ENCOUNTER — Ambulatory Visit (INDEPENDENT_AMBULATORY_CARE_PROVIDER_SITE_OTHER): Payer: Medicare HMO | Admitting: Family Medicine

## 2021-02-27 VITALS — BP 107/55 | HR 98 | Ht 66.0 in | Wt 160.0 lb

## 2021-02-27 DIAGNOSIS — E1142 Type 2 diabetes mellitus with diabetic polyneuropathy: Secondary | ICD-10-CM | POA: Diagnosis not present

## 2021-02-27 DIAGNOSIS — R0602 Shortness of breath: Secondary | ICD-10-CM

## 2021-02-27 DIAGNOSIS — I10 Essential (primary) hypertension: Secondary | ICD-10-CM

## 2021-02-27 NOTE — Assessment & Plan Note (Signed)
And back on track.  Blood sugars in the last week have been running in the 140s which is great.  I think the last time there was just a significant bump because he had been traveling and pretty much eating what he wanted to eat.  But he is back on track and doing much better.  A1c down to 8.3 today and its after 6 weeks.  I will see him back in 3 months and will check A1c again if he is noticing any trends with his blood sugars being elevated at that point then he can give [name] a call back or come in sooner.

## 2021-02-27 NOTE — Progress Notes (Signed)
Established Patient Office Visit  Subjective:  Patient ID: Justin Goodman, male    DOB: Aug 29, 1955  Age: 65 y.o. MRN: [id number]  CC:  Chief Complaint  Patient presents with   Diabetes    HPI Justin Goodman presents for   Diabetes - no hypoglycemic events. No wounds or sores that are not healing well. No increased thirst or urination. Checking glucose at home. Taking medications as prescribed without any side effects.  Also when I last saw him about 6 weeks ago he had complained about a cough for about a month.  I put him on Augmentin and he says that the cough got significantly better he still gets a little bit of mucus up first thing in the morning but overall he is feeling much better.  His only complaint is that he still feeling short of breath.  Particular with activity and going up stairs by the time he gets to the top he will feel very winded.  And this has been going on for about a month as well.  He has not noticed any extra noises such as wheezing etc.  Past Medical History:  Diagnosis Date   Chronic back pain    only mild loss of T12-L1 disk ht on xray 1-12   COPD (chronic obstructive pulmonary disease) ([facility])    Diabetes mellitus    w/ neuropathy   Hypertension    Smoker     Past Surgical History:  Procedure Laterality Date   LUMBAR LAMINECTOMY/DECOMPRESSION MICRODISCECTOMY N/A 09/25/2017   Procedure: LAMINECTOMY LUMBAR FOUR- LUMBAR FIVE;  Surgeon: Ashok Pall, MD;  Location: [facility];  Service: Neurosurgery;  Laterality: N/A;   UVULOPALATOPHARYNGOPLASTY     2001    Family History  Problem Relation Age of Onset   Diabetes Mother    Alcohol abuse Father        Liver disease killed father @ age 69.   Alcohol abuse Brother    Heart attack Brother        Died at age 18.    Social History   Socioeconomic History   Marital status: Single    Spouse name: Not on file   Number of children: 0   Years of education: 12th grade   Highest education level: High  school graduate  Occupational History    Comment: Retired  Tobacco Use   Smoking status: Former    Packs/day: 1.00    Years: 20.00    Pack years: 20.00    Types: Cigarettes    Quit date: 2013    Years since quitting: 9.8   Smokeless tobacco: Never  Vaping Use   Vaping Use: Never used  Substance and Sexual Activity   Alcohol use: No   Drug use: No   Sexual activity: Yes  Other Topics Concern   Not on file  Social History Narrative   Lives alone. Due to his neuropathy, he is not able to do much exercise. He enjoys watching t.v. in his free time.   Social Determinants of Health   Financial Resource Strain: Low Risk    Difficulty of Paying Living Expenses: Not hard at all  Food Insecurity: No Food Insecurity   Worried About Charity fundraiser in the Last Year: Never true   [place] in the Last Year: Never true  Transportation Needs: No Transportation Needs   Lack of Transportation (Medical): No   Lack of Transportation (Non-Medical): No  Physical Activity: Inactive   Days of Exercise per  Week: 0 days   Minutes of Exercise per Session: 0 min  Stress: No Stress Concern Present   Feeling of Stress : Not at all  Social Connections: Socially Isolated   Frequency of Communication with Friends and Family: Once a week   Frequency of Social Gatherings with Friends and Family: Never   Attends Religious Services: More than 4 times per year   Active Member of [employer] or Organizations: No   Attends Archivist Meetings: Never   Marital Status: Divorced  Human resources officer Violence: Not At Risk   Fear of Current or Ex-Partner: No   Emotionally Abused: No   Physically Abused: No   Sexually Abused: No    Outpatient Medications Prior to Visit  Medication Sig Dispense Refill   ACCU-CHEK GUIDE test strip USE TO CHECK BLOOD SUGAR UP TO 4 TIMES A DAY AS DIRECTED 300 strip 6   Alcohol Swabs (B-D SINGLE USE SWABS REGULAR) PADS Check blood glucose 4 times daily Diagnosis  diabetes E11.40 600 each 3   amitriptyline (ELAVIL) 75 MG tablet TAKE 1 TABLET (75 MG TOTAL) BY MOUTH AT BEDTIME AS NEEDED FOR SLEEP. 90 tablet 1   blood glucose meter kit and supplies Dispense based on patient and insurance preference. Use up to four times daily as directed. Dx: E11.9 1 each 0   gabapentin (NEURONTIN) 600 MG tablet TAKE 2 TABLETS (1,200 MG TOTAL) BY MOUTH 3 (THREE) TIMES DAILY. 540 tablet 0   insulin glargine, 1 Unit Dial, (TOUJEO SOLOSTAR) 300 UNIT/ML Solostar Pen INJECT 25 UNITS SUBCUTANEOUSLY ONCE DAILY (Patient taking differently: Inject 26 Units into the skin daily. INJECT 26 UNITS SUBCUTANEOUSLY ONCE DAILY) 4.5 mL 2   Insulin Pen Needle (B-D ULTRAFINE III SHORT PEN) 31G X 8 MM MISC USE FOR DAILY INSULIN INJECTION AS DIRECTED BY PHYSICIAN 100 each 4   losartan (COZAAR) 25 MG tablet Take 1 tablet (25 mg total) by mouth daily. 90 tablet 0   meloxicam (MOBIC) 7.5 MG tablet Take 1 tablet by mouth 2 (two) times daily.     metFORMIN (GLUCOPHAGE) 1000 MG tablet TAKE 1 TABLET (1,000 MG TOTAL) BY MOUTH 2 (TWO) TIMES DAILY WITH A MEAL. 180 tablet 3   metoprolol tartrate (LOPRESSOR) 50 MG tablet TAKE 1/2 TABLET BY MOUTH TWICE A DAY 90 tablet 1   omeprazole (PRILOSEC) 40 MG capsule TAKE 1 CAPSULE BY MOUTH EVERY DAY 90 capsule 3   pravastatin (PRAVACHOL) 20 MG tablet TAKE 1 TABLET BY MOUTH TWICE PER WEEK AT BEDTIME. 24 tablet 3   tiZANidine (ZANAFLEX) 4 MG tablet TAKE 1 TABLET BY MOUTH TWICE A DAY AS NEEDED FOR MUSCLE SPASM 60 tablet 2   traZODone (DESYREL) 150 MG tablet TAKE 1 TABLET BY MOUTH EVERYDAY AT BEDTIME 90 tablet 2   amoxicillin-clavulanate (AUGMENTIN) 875-125 MG tablet Take 1 tablet by mouth 2 (two) times daily. 14 tablet 0   predniSONE (DELTASONE) 20 MG tablet Take 2 tablets (40 mg total) by mouth daily with breakfast. 10 tablet 0   No facility-administered medications prior to visit.    Allergies  Allergen Reactions   Amaryl [Glimepiride] Other (See Comments)    Frequent  hypoglycemia   Atorvastatin Other (See Comments)    HA    Cymbalta [Duloxetine Hcl] Other (See Comments)    Dizziness, insomnia   Glipizide Other (See Comments)    HA   Losartan Other (See Comments)    Vision changes, HA.    Lyrica [Pregabalin] Other (See Comments)    Sedation  Quinapril Cough   Simvastatin Other (See Comments)    Severe myalgias.      ROS Review of Systems    Objective:    Physical Exam Constitutional:      Appearance: Normal appearance. He is well-developed.  HENT:     Head: Normocephalic and atraumatic.  Cardiovascular:     Rate and Rhythm: Normal rate and regular rhythm.     Heart sounds: Normal heart sounds.  Pulmonary:     Effort: Pulmonary effort is normal.     Breath sounds: Normal breath sounds.  Skin:    General: Skin is warm and dry.  Neurological:     Mental Status: He is alert and oriented to person, place, and time. Mental status is at baseline.  Psychiatric:        Behavior: Behavior normal.    BP (!) 107/55   Pulse 98   Ht _0  (1.676 m)   Wt 160 lb (72.6 kg)   SpO2 97%   BMI 25.82 kg/m  Wt Readings from Last 3 Encounters:  02/27/21 160 lb (72.6 kg)  01/16/21 161 lb (73 kg)  10/01/20 159 lb (72.1 kg)     Health Maintenance Due  Topic Date Due   Zoster Vaccines- Shingrix (1 of 2) Never done   COLON CANCER SCREENING ANNUAL FOBT  08/13/2017   COVID-19 Vaccine (3 - Booster for Moderna series) 10/17/2019    There are no preventive care reminders to display for this patient.  Lab Results  Component Value Date   TSH 2.11 08/12/2016   Lab Results  Component Value Date   WBC 3.3 (L) 03/28/2020   HGB 14.0 03/28/2020   HCT 41.1 03/28/2020   MCV 86.9 03/28/2020   PLT 169 03/28/2020   Lab Results  Component Value Date   NA 136 01/16/2021   K 5.2 01/16/2021   CO2 30 01/16/2021   GLUCOSE 149 (H) 01/16/2021   BUN 15 01/16/2021   CREATININE 0.70 01/16/2021   BILITOT 0.4 01/16/2021   ALKPHOS 51 08/12/2016   AST 13  01/16/2021   ALT 8 (L) 01/16/2021   PROT 7.5 01/16/2021   ALBUMIN 4.5 08/12/2016   CALCIUM 10.2 01/16/2021   ANIONGAP 5 09/18/2017   EGFR 101 01/16/2021   Lab Results  Component Value Date   CHOL 152 06/26/2020   Lab Results  Component Value Date   HDL 32 (L) 06/26/2020   Lab Results  Component Value Date   LDLCALC 95 06/26/2020   Lab Results  Component Value Date   TRIG 156 (H) 06/26/2020   Lab Results  Component Value Date   CHOLHDL 4.8 06/26/2020   Lab Results  Component Value Date   HGBA1C 9.0 (A) 01/16/2021      Assessment & Plan:   Problem List Items Addressed This Visit       Cardiovascular and Mediastinum   ESSENTIAL HYPERTENSION, BENIGN - Primary    Well controlled. Continue current regimen. Follow up in  3 mo         Endocrine   Well controlled type 2 diabetes mellitus with peripheral neuropathy ([facility])    And back on track.  Blood sugars in the last week have been running in the 140s which is great.  I think the last time there was just a significant bump because he had been traveling and pretty much eating what he wanted to eat.  But he is back on track and doing much better.  A1c down to 8.3  today and its after 6 weeks.  I will see him back in 3 months and will check A1c again if he is noticing any trends with his blood sugars being elevated at that point then he can give [name] a call back or come in sooner.      Other Visit Diagnoses     SOB (shortness of breath)       Relevant Orders   DG Chest 2 View      Shortness of breath-likely secondary to infection that he had about a month ago but since this has been going on for 2 months total at this point I think a chest x-ray is warranted for further work-up.  No sign of volume overload on exam and he denies any recent swelling.  Weight is stable.  He denies any chest discomfort or chest pain.  No orders of the defined types were placed in this encounter.   Follow-up: Return in about 3 months  (around 05/30/2021) for Diabetes follow-up.    Beatrice Lecher, MD

## 2021-02-27 NOTE — Assessment & Plan Note (Signed)
Well controlled. Continue current regimen. Follow up in  3 mo .  

## 2021-03-02 ENCOUNTER — Other Ambulatory Visit: Payer: Self-pay | Admitting: Family Medicine

## 2021-03-04 ENCOUNTER — Other Ambulatory Visit: Payer: Self-pay | Admitting: Family Medicine

## 2021-03-04 NOTE — Progress Notes (Signed)
Hi Justin Goodman, chest x-ray looks good no worrisome findings.  Would like to refer you to a lung specialist caudal pulmonologist.  If you are okay with this please let me know and if you have a preference for location or provider then please let me know.

## 2021-03-27 ENCOUNTER — Other Ambulatory Visit: Payer: Self-pay | Admitting: *Deleted

## 2021-03-27 DIAGNOSIS — E1142 Type 2 diabetes mellitus with diabetic polyneuropathy: Secondary | ICD-10-CM

## 2021-03-27 MED ORDER — ACCU-CHEK GUIDE VI STRP: ORAL_STRIP | 6 refills | Status: DC

## 2021-03-27 MED ORDER — ACCU-CHEK FASTCLIX LANCETS MISC: 3 refills | Status: DC

## 2021-03-27 MED ORDER — BLOOD GLUCOSE METER KIT: PACK | 0 refills | Status: AC

## 2021-04-04 ENCOUNTER — Other Ambulatory Visit: Payer: Self-pay | Admitting: Family Medicine

## 2021-04-04 DIAGNOSIS — E1142 Type 2 diabetes mellitus with diabetic polyneuropathy: Secondary | ICD-10-CM

## 2021-04-04 MED ORDER — ACCU-CHEK GUIDE VI STRP: ORAL_STRIP | 6 refills | Status: DC

## 2021-04-04 NOTE — Addendum Note (Signed)
Addended by: Osborne Oman on: 04/04/2021 01:15 PM   Modules accepted: Orders

## 2021-04-10 ENCOUNTER — Other Ambulatory Visit: Payer: Self-pay | Admitting: Family Medicine

## 2021-04-13 ENCOUNTER — Other Ambulatory Visit: Payer: Self-pay | Admitting: Family Medicine

## 2021-04-13 DIAGNOSIS — E1142 Type 2 diabetes mellitus with diabetic polyneuropathy: Secondary | ICD-10-CM

## 2021-04-25 DIAGNOSIS — E1142 Type 2 diabetes mellitus with diabetic polyneuropathy: Secondary | ICD-10-CM | POA: Diagnosis not present

## 2021-04-25 DIAGNOSIS — L602 Onychogryphosis: Secondary | ICD-10-CM | POA: Diagnosis not present

## 2021-05-20 ENCOUNTER — Other Ambulatory Visit: Payer: Self-pay | Admitting: Family Medicine

## 2021-05-20 DIAGNOSIS — E1142 Type 2 diabetes mellitus with diabetic polyneuropathy: Secondary | ICD-10-CM

## 2021-05-29 ENCOUNTER — Other Ambulatory Visit: Payer: Self-pay | Admitting: Family Medicine

## 2021-05-29 NOTE — Progress Notes (Signed)
Established Patient Office Visit  Subjective:  Patient ID: Justin Goodman, male    DOB: 06/29/1955  Age: 66 y.o. MRN: [id number]  CC:  Chief Complaint  Patient presents with   Diabetes    Follow up     HPI Justin Goodman presents for   Diabetes - no hypoglycemic events. No wounds or sores that are not healing well. No increased thirst or urination. Checking glucose at home.  Seeing blood sugars running in the 150s.  Taking medications as prescribed without any side effects.  Justin Goodman feels like Justin Goodman is back on track and really trying hard with his diet.  Hypertension- Pt denies chest pain, SOB, dizziness, or heart palpitations.  Taking meds as directed w/o problems.  Denies medication side effects.     Past Medical History:  Diagnosis Date   Chronic back pain    only mild loss of T12-L1 disk ht on xray 1-12   COPD (chronic obstructive pulmonary disease) (HCC)    Diabetes mellitus    w/ neuropathy   Hypertension    Smoker     Past Surgical History:  Procedure Laterality Date   LUMBAR LAMINECTOMY/DECOMPRESSION MICRODISCECTOMY N/A 09/25/2017   Procedure: LAMINECTOMY LUMBAR FOUR- LUMBAR FIVE;  Surgeon: Ashok Pall, MD;  Location: [facility];  Service: Neurosurgery;  Laterality: N/A;   UVULOPALATOPHARYNGOPLASTY     2001    Family History  Problem Relation Age of Onset   Diabetes Mother    Alcohol abuse Father        Liver disease killed father @ age 90.   Alcohol abuse Brother    Heart attack Brother        Died at age 48.    Social History   Socioeconomic History   Marital status: Single    Spouse name: Not on file   Number of children: 0   Years of education: 12th grade   Highest education level: High school graduate  Occupational History    Comment: Retired  Tobacco Use   Smoking status: Former    Packs/day: 1.00    Years: 20.00    Pack years: 20.00    Types: Cigarettes    Quit date: 2013    Years since quitting: 10.1   Smokeless tobacco: Never  Vaping Use    Vaping Use: Never used  Substance and Sexual Activity   Alcohol use: No   Drug use: No   Sexual activity: Yes  Other Topics Concern   Not on file  Social History Narrative   Lives alone. Due to his neuropathy, Justin Goodman is not able to do much exercise. Justin Goodman enjoys watching t.v. in his free time.   Social Determinants of Health   Financial Resource Strain: Low Risk    Difficulty of Paying Living Expenses: Not hard at all  Food Insecurity: No Food Insecurity   Worried About Charity fundraiser in the Last Year: Never true   [place] in the Last Year: Never true  Transportation Needs: No Transportation Needs   Lack of Transportation (Medical): No   Lack of Transportation (Non-Medical): No  Physical Activity: Inactive   Days of Exercise per Week: 0 days   Minutes of Exercise per Session: 0 min  Stress: No Stress Concern Present   Feeling of Stress : Not at all  Social Connections: Socially Isolated   Frequency of Communication with Friends and Family: Once a week   Frequency of Social Gatherings with Friends and Family: Never  Attends Religious Services: More than 4 times per year   Active Member of Clubs or Organizations: No   Attends Archivist Meetings: Never   Marital Status: Divorced  Human resources officer Violence: Not At Risk   Fear of Current or Ex-Partner: No   Emotionally Abused: No   Physically Abused: No   Sexually Abused: No    Outpatient Medications Prior to Visit  Medication Sig Dispense Refill   omeprazole (PRILOSEC) 40 MG capsule TAKE 1 CAPSULE BY MOUTH EVERY DAY 90 capsule 4   gabapentin (NEURONTIN) 600 MG tablet TAKE 2 TABLETS (1,200 MG TOTAL) BY MOUTH 3 (THREE) TIMES DAILY. 540 tablet 0   metoprolol tartrate (LOPRESSOR) 50 MG tablet TAKE 1/2 TABLET BY MOUTH TWICE A DAY 90 tablet 1   Accu-Chek FastClix Lancets MISC Check blood glucose 4 times daily Diagnosis diabetes E11.40 600 each 3   Alcohol Swabs (B-D SINGLE USE SWABS REGULAR) PADS Check blood  glucose 4 times daily Diagnosis diabetes E11.40 600 each 3   blood glucose meter kit and supplies Dispense based on patient and insurance preference. Use up to four times daily as directed. Dx: E11.9 1 each 0   glucose blood (ACCU-CHEK GUIDE) test strip Use up to 4 times daily. Dx: E11.40 300 strip 6   insulin glargine, 1 Unit Dial, (TOUJEO SOLOSTAR) 300 UNIT/ML Solostar Pen INJECT 25 UNITS SUBCUTANEOUSLY ONCE DAILY (Patient taking differently: Inject 26 Units into the skin daily. INJECT 26 UNITS SUBCUTANEOUSLY ONCE DAILY) 4.5 mL 2   metFORMIN (GLUCOPHAGE) 1000 MG tablet TAKE 1 TABLET (1,000 MG TOTAL) BY MOUTH 2 (TWO) TIMES DAILY WITH A MEAL. 180 tablet 3   SURE COMFORT PEN NEEDLES 32G X 4 MM MISC USE FOR DAILY INSULIN INJECTION AS DIRECTED BY PHYSICIAN 100 each 4   traZODone (DESYREL) 150 MG tablet TAKE 1 TABLET BY MOUTH EVERYDAY AT BEDTIME 90 tablet 2   amitriptyline (ELAVIL) 75 MG tablet TAKE 1 TABLET (75 MG TOTAL) BY MOUTH AT BEDTIME AS NEEDED FOR SLEEP. 90 tablet 1   losartan (COZAAR) 25 MG tablet Take 1 tablet (25 mg total) by mouth daily. 90 tablet 0   meloxicam (MOBIC) 7.5 MG tablet Take 1 tablet by mouth 2 (two) times daily.     pravastatin (PRAVACHOL) 20 MG tablet TAKE 1 TABLET BY MOUTH TWICE PER WEEK AT BEDTIME. 24 tablet 3   tiZANidine (ZANAFLEX) 4 MG tablet TAKE 1 TABLET BY MOUTH TWICE A DAY AS NEEDED FOR MUSCLE SPASMS 180 tablet 0   No facility-administered medications prior to visit.    Allergies  Allergen Reactions   Amaryl [Glimepiride] Other (See Comments)    Frequent hypoglycemia   Atorvastatin Other (See Comments)    HA    Cymbalta [Duloxetine Hcl] Other (See Comments)    Dizziness, insomnia   Glipizide Other (See Comments)    HA   Losartan Other (See Comments)    Vision changes, HA.    Lyrica [Pregabalin] Other (See Comments)    Sedation   Quinapril Cough   Simvastatin Other (See Comments)    Severe myalgias.      ROS Review of Systems    Objective:     Physical Exam Constitutional:      Appearance: Normal appearance. Justin Goodman is well-developed.  HENT:     Head: Normocephalic and atraumatic.  Cardiovascular:     Rate and Rhythm: Normal rate and regular rhythm.     Heart sounds: Normal heart sounds.  Pulmonary:     Effort: Pulmonary  effort is normal.     Breath sounds: Normal breath sounds.  Skin:    General: Skin is warm and dry.  Neurological:     Mental Status: Justin Goodman is alert and oriented to person, place, and time. Mental status is at baseline.  Psychiatric:        Behavior: Behavior normal.    BP 105/60    Pulse 72    Resp 16    Ht 5' 6"  (1.676 m)    Wt 165 lb (74.8 kg)    SpO2 97%    BMI 26.63 kg/m  Wt Readings from Last 3 Encounters:  05/30/21 165 lb (74.8 kg)  02/27/21 160 lb (72.6 kg)  01/16/21 161 lb (73 kg)     Health Maintenance Due  Topic Date Due   Zoster Vaccines- Shingrix (1 of 2) Never done   COVID-19 Vaccine (3 - Booster for Moderna series) 10/17/2019    There are no preventive care reminders to display for this patient.  Lab Results  Component Value Date   TSH 2.11 08/12/2016   Lab Results  Component Value Date   WBC 3.3 (L) 03/28/2020   HGB 14.0 03/28/2020   HCT 41.1 03/28/2020   MCV 86.9 03/28/2020   PLT 169 03/28/2020   Lab Results  Component Value Date   NA 136 01/16/2021   K 5.2 01/16/2021   CO2 30 01/16/2021   GLUCOSE 149 (H) 01/16/2021   BUN 15 01/16/2021   CREATININE 0.70 01/16/2021   BILITOT 0.4 01/16/2021   ALKPHOS 51 08/12/2016   AST 13 01/16/2021   ALT 8 (L) 01/16/2021   PROT 7.5 01/16/2021   ALBUMIN 4.5 08/12/2016   CALCIUM 10.2 01/16/2021   ANIONGAP 5 09/18/2017   EGFR 101 01/16/2021   Lab Results  Component Value Date   CHOL 152 06/26/2020   Lab Results  Component Value Date   HDL 32 (L) 06/26/2020   Lab Results  Component Value Date   LDLCALC 95 06/26/2020   Lab Results  Component Value Date   TRIG 156 (H) 06/26/2020   Lab Results  Component Value Date    CHOLHDL 4.8 06/26/2020   Lab Results  Component Value Date   HGBA1C 9.0 (A) 01/16/2021      Assessment & Plan:   Problem List Items Addressed This Visit       Cardiovascular and Mediastinum   ESSENTIAL HYPERTENSION, BENIGN - Primary    Blood pressure is still low today similar to last time Justin Goodman is actually already splitting his metoprolol in half we discussed possibly stopping the metoprolol or splitting the losartan in half.  Justin Goodman would rather try to split the losartan in half which is fine.  So Justin Goodman is going to try it even though it is small.  Just to see if the blood pressure looks little bit better when Justin Goodman comes back Justin Goodman is not feeling lightheaded or dizzy.      Relevant Medications   metoprolol tartrate (LOPRESSOR) 50 MG tablet   pravastatin (PRAVACHOL) 20 MG tablet   losartan (COZAAR) 25 MG tablet   Other Relevant Orders   Hemoglobin O0H   BASIC METABOLIC PANEL WITH GFR     Endocrine   Well controlled type 2 diabetes mellitus with peripheral neuropathy ([place])    Unable to do fingerstick A1c today because we are out of cartridges.  Last A1c was 9.0 Justin Goodman says fasting blood sugars have been running in the 150s which is still not optimal but hopefully  his A1c is below 8.  We will have a venipuncture done today to verify that and then we can adjust his dose if needed Justin Goodman is currently doing 26 units on his insulin so we may need to adjust that slightly Justin Goodman feels like Justin Goodman is really trying hard with his diet and activity level.  Justin Goodman does need refills on a lot of medications today.      Relevant Medications   gabapentin (NEURONTIN) 600 MG tablet   tiZANidine (ZANAFLEX) 4 MG tablet   pravastatin (PRAVACHOL) 20 MG tablet   amitriptyline (ELAVIL) 75 MG tablet   losartan (COZAAR) 25 MG tablet   Other Relevant Orders   Hemoglobin C7E   BASIC METABOLIC PANEL WITH GFR   Diabetic peripheral neuropathy (HCC)    Feels like gabapentin is the most effective for him.  We will send refills to the  pharmacy.      Relevant Medications   gabapentin (NEURONTIN) 600 MG tablet   tiZANidine (ZANAFLEX) 4 MG tablet   pravastatin (PRAVACHOL) 20 MG tablet   amitriptyline (ELAVIL) 75 MG tablet   losartan (COZAAR) 25 MG tablet     Other   Spinal stenosis of lumbar region   Relevant Medications   amitriptyline (ELAVIL) 75 MG tablet   Lumbar stenosis with neurogenic claudication    Refilled his meloxicam today wanting to continue to monitor renal function.      Relevant Medications   gabapentin (NEURONTIN) 600 MG tablet   tiZANidine (ZANAFLEX) 4 MG tablet   amitriptyline (ELAVIL) 75 MG tablet   meloxicam (MOBIC) 7.5 MG tablet   Hyperlipidemia    Due to refill pravastatin.  Justin Goodman basically takes it twice per week because that is what Justin Goodman is able to tolerate without myalgias.      Relevant Medications   metoprolol tartrate (LOPRESSOR) 50 MG tablet   pravastatin (PRAVACHOL) 20 MG tablet   losartan (COZAAR) 25 MG tablet   Other Visit Diagnoses     Screening for prostate cancer       Relevant Orders   PSA       declines colon cancer screening.  Justin Goodman does not want a scope and Justin Goodman got frustrated with the Cologuard Justin Goodman said Justin Goodman sent the sample once and it was too much stool Justin Goodman is in another time it was too little stool and so Justin Goodman just does not want to do it anymore.  Justin Goodman also declines stool cards.  Meds ordered this encounter  Medications   metoprolol tartrate (LOPRESSOR) 50 MG tablet    Sig: Take 0.5 tablets (25 mg total) by mouth 2 (two) times daily.    Dispense:  90 tablet    Refill:  3   gabapentin (NEURONTIN) 600 MG tablet    Sig: Take 2 tablets (1,200 mg total) by mouth 3 (three) times daily.    Dispense:  540 tablet    Refill:  3   tiZANidine (ZANAFLEX) 4 MG tablet    Sig: TAKE 1 TABLET BY MOUTH TWICE A DAY AS NEEDED FOR MUSCLE SPASMS    Dispense:  180 tablet    Refill:  0   pravastatin (PRAVACHOL) 20 MG tablet    Sig: TAKE 1 TABLET BY MOUTH TWICE PER WEEK AT BEDTIME.     Dispense:  24 tablet    Refill:  3    DX Code Needed  .   amitriptyline (ELAVIL) 75 MG tablet    Sig: Take 1 tablet (75 mg total) by mouth at bedtime  as needed for sleep.    Dispense:  90 tablet    Refill:  3   losartan (COZAAR) 25 MG tablet    Sig: Take 1 tablet (25 mg total) by mouth daily.    Dispense:  90 tablet    Refill:  3    Please d/c the 43m tabs   meloxicam (MOBIC) 7.5 MG tablet    Sig: Take 1 tablet (7.5 mg total) by mouth 2 (two) times daily.    Dispense:  180 tablet    Refill:  3    Follow-up: Return in about 3 months (around 09/02/2021) for Diabetes follow-up.    CBeatrice Lecher MD

## 2021-05-30 ENCOUNTER — Encounter: Payer: Self-pay | Admitting: Family Medicine

## 2021-05-30 ENCOUNTER — Other Ambulatory Visit: Payer: Self-pay

## 2021-05-30 ENCOUNTER — Ambulatory Visit (INDEPENDENT_AMBULATORY_CARE_PROVIDER_SITE_OTHER): Payer: Medicare HMO | Admitting: Family Medicine

## 2021-05-30 VITALS — BP 105/60 | HR 72 | Resp 16 | Ht 66.0 in | Wt 165.0 lb

## 2021-05-30 DIAGNOSIS — E785 Hyperlipidemia, unspecified: Secondary | ICD-10-CM

## 2021-05-30 DIAGNOSIS — Z125 Encounter for screening for malignant neoplasm of prostate: Secondary | ICD-10-CM | POA: Diagnosis not present

## 2021-05-30 DIAGNOSIS — J449 Chronic obstructive pulmonary disease, unspecified: Secondary | ICD-10-CM | POA: Diagnosis not present

## 2021-05-30 DIAGNOSIS — E1142 Type 2 diabetes mellitus with diabetic polyneuropathy: Secondary | ICD-10-CM | POA: Diagnosis not present

## 2021-05-30 DIAGNOSIS — I10 Essential (primary) hypertension: Secondary | ICD-10-CM | POA: Diagnosis not present

## 2021-05-30 DIAGNOSIS — M48062 Spinal stenosis, lumbar region with neurogenic claudication: Secondary | ICD-10-CM | POA: Diagnosis not present

## 2021-05-30 DIAGNOSIS — M48061 Spinal stenosis, lumbar region without neurogenic claudication: Secondary | ICD-10-CM | POA: Diagnosis not present

## 2021-05-30 MED ORDER — METOPROLOL TARTRATE 50 MG PO TABS: 25.0000 mg | ORAL_TABLET | Freq: Two times a day (BID) | ORAL | 3 refills | Status: DC

## 2021-05-30 MED ORDER — LOSARTAN POTASSIUM 25 MG PO TABS: 25.0000 mg | ORAL_TABLET | Freq: Every day | ORAL | 3 refills | Status: DC

## 2021-05-30 MED ORDER — PRAVASTATIN SODIUM 20 MG PO TABS: ORAL_TABLET | ORAL | 3 refills | Status: DC

## 2021-05-30 MED ORDER — TIZANIDINE HCL 4 MG PO TABS: ORAL_TABLET | ORAL | 0 refills | Status: DC

## 2021-05-30 MED ORDER — MELOXICAM 7.5 MG PO TABS: 7.5000 mg | ORAL_TABLET | Freq: Two times a day (BID) | ORAL | 3 refills | Status: DC

## 2021-05-30 MED ORDER — AMITRIPTYLINE HCL 75 MG PO TABS: 75.0000 mg | ORAL_TABLET | Freq: Every evening | ORAL | 3 refills | Status: DC | PRN

## 2021-05-30 MED ORDER — GABAPENTIN 600 MG PO TABS: 1200.0000 mg | ORAL_TABLET | Freq: Three times a day (TID) | ORAL | 3 refills | Status: DC

## 2021-05-30 NOTE — Patient Instructions (Signed)
Please cut your losartan in half. And take half a tab daily.

## 2021-05-30 NOTE — Assessment & Plan Note (Signed)
Refilled his meloxicam today wanting to continue to monitor renal function.

## 2021-05-30 NOTE — Assessment & Plan Note (Signed)
Feels like gabapentin is the most effective for him.  We will send refills to the pharmacy.

## 2021-05-30 NOTE — Assessment & Plan Note (Signed)
Blood pressure is still low today similar to last time he is actually already splitting his metoprolol in half we discussed possibly stopping the metoprolol or splitting the losartan in half.  He would rather try to split the losartan in half which is fine.  So he is going to try it even though it is small.  Just to see if the blood pressure looks little bit better when he comes back he is not feeling lightheaded or dizzy.

## 2021-05-30 NOTE — Assessment & Plan Note (Signed)
Unable to do fingerstick A1c today because we are out of cartridges.  Last A1c was 9.0 he says fasting blood sugars have been running in the 150s which is still not optimal but hopefully his A1c is below 8.  We will have a venipuncture done today to verify that and then we can adjust his dose if needed he is currently doing 26 units on his insulin so we may need to adjust that slightly he feels like he is really trying hard with his diet and activity level.  He does need refills on a lot of medications today.

## 2021-05-30 NOTE — Assessment & Plan Note (Signed)
Due to refill pravastatin.  He basically takes it twice per week because that is what he is able to tolerate without myalgias.

## 2021-05-31 LAB — BASIC METABOLIC PANEL WITH GFR
BUN: 22 mg/dL (ref 7–25)
CO2: 30 mmol/L (ref 20–32)
Calcium: 10 mg/dL (ref 8.6–10.3)
Chloride: 100 mmol/L (ref 98–110)
Creat: 0.8 mg/dL (ref 0.70–1.35)
Glucose, Bld: 135 mg/dL — ABNORMAL HIGH (ref 65–99)
Potassium: 5 mmol/L (ref 3.5–5.3)
Sodium: 134 mmol/L — ABNORMAL LOW (ref 135–146)
eGFR: 97 mL/min/{1.73_m2} (ref >=60)

## 2021-05-31 LAB — PSA: PSA: 0.32 ng/mL (ref <=4.00)

## 2021-05-31 LAB — HEMOGLOBIN A1C
Hgb A1c MFr Bld: 9.5 % of total Hgb — ABNORMAL HIGH (ref <5.7)
Mean Plasma Glucose: 226 mg/dL
eAG (mmol/L): 12.5 mmol/L

## 2021-05-31 NOTE — Progress Notes (Signed)
Hi Mr. Margrave, your A1c is up to 9.5.  Lets go ahead and increase your insulin to 32 units daily.  Please check your sugars in the morning before breakfast and write them down for the next 10 days.  And then call us back with those numbers and then we can make another adjustment if we need to.  I would also like to adjust your metformin to a combo pill to help as well.  So would still have the metformin in it but it would also have a second ingredient.  Please let me know if you are okay with the change and I can send that over to the pharmacy.

## 2021-06-03 ENCOUNTER — Other Ambulatory Visit: Payer: Self-pay | Admitting: Family Medicine

## 2021-06-03 DIAGNOSIS — E1142 Type 2 diabetes mellitus with diabetic polyneuropathy: Secondary | ICD-10-CM

## 2021-06-04 ENCOUNTER — Telehealth: Payer: Self-pay | Admitting: Family Medicine

## 2021-06-04 NOTE — Telephone Encounter (Signed)
Patient called and stated he needs the medication listed below, he stated that it was supposed to be called in to the pharmacy and the pharmacy does not have it, he said he just checked this morning and he needs this medication today.   insulin glargine, 1 Unit Dial, (TOUJEO SOLOSTAR) 300 UNIT/ML Solostar Pen   [id number] - [place], [place] - [address] Phone:  [phone]  Fax:  [phone]

## 2021-06-04 NOTE — Telephone Encounter (Signed)
Prescription sent this morning to pharmacy. Patient advised.

## 2021-06-05 MED ORDER — INVOKAMET XR 50-1000 MG PO TB24: 1.0000 | ORAL_TABLET | Freq: Every day | ORAL | 1 refills | Status: DC

## 2021-06-05 NOTE — Progress Notes (Signed)
New prescription sent to pharmacy based on his formulary.  It will have metformin already in it.  So once he starts the new pill he will discontinue his plain metformin.

## 2021-06-05 NOTE — Addendum Note (Signed)
Addended by: Beatrice Lecher D on: 06/05/2021 02:58 PM   Modules accepted: Orders

## 2021-06-05 NOTE — Progress Notes (Signed)
Meds ordered this encounter  Medications   metoprolol tartrate (LOPRESSOR) 50 MG tablet    Sig: Take 0.5 tablets (25 mg total) by mouth 2 (two) times daily.    Dispense:  90 tablet    Refill:  3   gabapentin (NEURONTIN) 600 MG tablet    Sig: Take 2 tablets (1,200 mg total) by mouth 3 (three) times daily.    Dispense:  540 tablet    Refill:  3   tiZANidine (ZANAFLEX) 4 MG tablet    Sig: TAKE 1 TABLET BY MOUTH TWICE A DAY AS NEEDED FOR MUSCLE SPASMS    Dispense:  180 tablet    Refill:  0   pravastatin (PRAVACHOL) 20 MG tablet    Sig: TAKE 1 TABLET BY MOUTH TWICE PER WEEK AT BEDTIME.    Dispense:  24 tablet    Refill:  3    DX Code Needed  .   amitriptyline (ELAVIL) 75 MG tablet    Sig: Take 1 tablet (75 mg total) by mouth at bedtime as needed for sleep.    Dispense:  90 tablet    Refill:  3   losartan (COZAAR) 25 MG tablet    Sig: Take 1 tablet (25 mg total) by mouth daily.    Dispense:  90 tablet    Refill:  3    Please d/c the 50mg  tabs   meloxicam (MOBIC) 7.5 MG tablet    Sig: Take 1 tablet (7.5 mg total) by mouth 2 (two) times daily.    Dispense:  180 tablet    Refill:  3   Canagliflozin-metFORMIN HCl ER (INVOKAMET XR) 50-1000 MG TB24    Sig: Take 1 tablet by mouth daily.    Dispense:  90 tablet    Refill:  1    Please d/c all metformin refills

## 2021-08-20 ENCOUNTER — Encounter: Payer: Self-pay | Admitting: Family Medicine

## 2021-08-20 ENCOUNTER — Ambulatory Visit (INDEPENDENT_AMBULATORY_CARE_PROVIDER_SITE_OTHER): Payer: Medicare HMO | Admitting: Family Medicine

## 2021-08-20 VITALS — BP 118/58 | HR 82 | Resp 16 | Ht 66.0 in | Wt 157.0 lb

## 2021-08-20 DIAGNOSIS — M48062 Spinal stenosis, lumbar region with neurogenic claudication: Secondary | ICD-10-CM | POA: Diagnosis not present

## 2021-08-20 DIAGNOSIS — I10 Essential (primary) hypertension: Secondary | ICD-10-CM | POA: Diagnosis not present

## 2021-08-20 DIAGNOSIS — E1142 Type 2 diabetes mellitus with diabetic polyneuropathy: Secondary | ICD-10-CM

## 2021-08-20 LAB — POCT GLYCOSYLATED HEMOGLOBIN (HGB A1C): Hemoglobin A1C: 8.8 % — AB (ref 4.0–5.6)

## 2021-08-20 MED ORDER — MELOXICAM 7.5 MG PO TABS: 7.5000 mg | ORAL_TABLET | Freq: Two times a day (BID) | ORAL | 1 refills | Status: DC

## 2021-08-20 MED ORDER — TIZANIDINE HCL 4 MG PO TABS: ORAL_TABLET | ORAL | 0 refills | Status: DC

## 2021-08-20 MED ORDER — EMPAGLIFLOZIN-METFORMIN HCL 12.5-1000 MG PO TABS: 1.0000 | ORAL_TABLET | Freq: Two times a day (BID) | ORAL | 1 refills | Status: DC

## 2021-08-20 NOTE — Assessment & Plan Note (Signed)
Well controlled. Continue current regimen. Follow up in  3 months.  

## 2021-08-20 NOTE — Assessment & Plan Note (Signed)
A1c does look better today at 8.8, which is down from previous at 9.5.  He has been tolerating the invoke a met well without any side effects or problems.  Unfortunately it is costing him $145 for 90-day supply, it was $50 a month.  He wants to know if there is anything cheaper.  Still using 30 units of his insulin. ?

## 2021-08-20 NOTE — Assessment & Plan Note (Signed)
Like a refill on his anti-inflammatory and muscle relaxer. ?

## 2021-08-20 NOTE — Progress Notes (Signed)
? ?Established Patient Office Visit ? ?Subjective   ?Patient ID: Justin Goodman, male    DOB: 01-09-1956  Age: 66 y.o. MRN: [id number] ? ?Chief Complaint  ?Patient presents with  ? Diabetes  ?  Follow up  A1c= 8.8%  ? Hypertension  ?  Follow up   ? ? ?HPI ? ?Diabetes - no hypoglycemic events. No wounds or sores that are not healing well. No increased thirst or urination. Checking glucose at home. Taking medications as prescribed without any side effects.  Ports home blood sugars have been running in the 130s and 140s.  No hypoglycemic episodes.  He has not been able to exercise because of his foot pain.  In fact he was actually supposed to see podiatry this morning to get the appointments next up.  He plans to reschedule. ? ?Hypertension- Pt denies chest pain, SOB, dizziness, or heart palpitations.  Taking meds as directed w/o problems.  Denies medication side effects.   ? ?ROS ? ?  ?Objective:  ?  ? ?BP (!) 118/58   Pulse 82   Resp 16   Ht _0  (1.676 m)   Wt 157 lb (71.2 kg)   SpO2 98%   BMI 25.34 kg/m?  ?  ? ?Physical Exam ?Constitutional:   ?   Appearance: He is well-developed.  ?HENT:  ?   Head: Normocephalic and atraumatic.  ?Cardiovascular:  ?   Rate and Rhythm: Normal rate and regular rhythm.  ?   Heart sounds: Normal heart sounds.  ?Pulmonary:  ?   Effort: Pulmonary effort is normal.  ?   Breath sounds: Normal breath sounds.  ?Skin: ?   General: Skin is warm and dry.  ?Neurological:  ?   Mental Status: He is alert and oriented to person, place, and time.  ?Psychiatric:     ?   Behavior: Behavior normal.  ? ? ? ?Results for orders placed or performed in visit on 08/20/21  ?POCT HgB A1C  ?Result Value Ref Range  ? Hemoglobin A1C 8.8 (A) 4.0 - 5.6 %  ? HbA1c POC (<> result, manual entry)    ? HbA1c, POC (prediabetic range)    ? HbA1c, POC (controlled diabetic range)    ? ? ?  ? ?The 10-year ASCVD risk score (Arnett DK, et al., 2019) is: 28.3% ? ?  ?Assessment & Plan:  ? ?Problem List Items Addressed  This Visit   ? ?  ? Cardiovascular and Mediastinum  ? ESSENTIAL HYPERTENSION, BENIGN  ?  Well controlled. Continue current regimen. Follow up in 3 months.  ? ?  ?  ?  ? Endocrine  ? Well controlled type 2 diabetes mellitus with peripheral neuropathy ([place]) - Primary  ?  A1c does look better today at 8.8, which is down from previous at 9.5.  He has been tolerating the invoke a met well without any side effects or problems.  Unfortunately it is costing him $145 for 90-day supply, it was $50 a month.  He wants to know if there is anything cheaper.  Still using 30 units of his insulin. ? ?  ?  ? Relevant Medications  ? tiZANidine (ZANAFLEX) 4 MG tablet  ? Empagliflozin-metFORMIN HCl 12.08-998 MG TABS  ? Other Relevant Orders  ? POCT HgB A1C (Completed)  ?  ? Other  ? Lumbar stenosis with neurogenic claudication  ?  Like a refill on his anti-inflammatory and muscle relaxer. ? ?  ?  ? Relevant Medications  ? meloxicam (MOBIC) 7.5  MG tablet  ? tiZANidine (ZANAFLEX) 4 MG tablet  ? ? ?Going to reschedule his appointment with podiatry. ? ?Return in about 3 months (around 11/25/2021) for Diabetes follow-up, Hypertension.  ? ?  ?Beatrice Lecher, MD ? ?

## 2021-08-22 DIAGNOSIS — E119 Type 2 diabetes mellitus without complications: Secondary | ICD-10-CM | POA: Diagnosis not present

## 2021-09-05 ENCOUNTER — Other Ambulatory Visit: Payer: Self-pay | Admitting: Family Medicine

## 2021-09-05 DIAGNOSIS — E1142 Type 2 diabetes mellitus with diabetic polyneuropathy: Secondary | ICD-10-CM

## 2021-10-01 ENCOUNTER — Other Ambulatory Visit: Payer: Self-pay | Admitting: Family Medicine

## 2021-11-01 DIAGNOSIS — H547 Unspecified visual loss: Secondary | ICD-10-CM | POA: Diagnosis not present

## 2021-11-01 DIAGNOSIS — E119 Type 2 diabetes mellitus without complications: Secondary | ICD-10-CM | POA: Diagnosis not present

## 2021-11-26 ENCOUNTER — Encounter: Payer: Self-pay | Admitting: Family Medicine

## 2021-11-26 ENCOUNTER — Ambulatory Visit (INDEPENDENT_AMBULATORY_CARE_PROVIDER_SITE_OTHER): Payer: Medicare HMO | Admitting: Family Medicine

## 2021-11-26 VITALS — BP 136/70 | HR 77 | Wt 164.0 lb

## 2021-11-26 DIAGNOSIS — J984 Other disorders of lung: Secondary | ICD-10-CM

## 2021-11-26 DIAGNOSIS — I10 Essential (primary) hypertension: Secondary | ICD-10-CM | POA: Diagnosis not present

## 2021-11-26 DIAGNOSIS — I700 Atherosclerosis of aorta: Secondary | ICD-10-CM

## 2021-11-26 DIAGNOSIS — H6123 Impacted cerumen, bilateral: Secondary | ICD-10-CM

## 2021-11-26 DIAGNOSIS — E1142 Type 2 diabetes mellitus with diabetic polyneuropathy: Secondary | ICD-10-CM

## 2021-11-26 LAB — POCT GLYCOSYLATED HEMOGLOBIN (HGB A1C): HbA1c, POC (controlled diabetic range): 7.8 % — AB (ref 0.0–7.0)

## 2021-11-26 MED ORDER — GABAPENTIN 600 MG PO TABS: 1200.0000 mg | ORAL_TABLET | Freq: Three times a day (TID) | ORAL | 3 refills | Status: DC

## 2021-11-26 NOTE — Progress Notes (Signed)
Established Patient Office Visit  Subjective   Patient ID: Justin Goodman, male    DOB: 1956/02/14  Age: 66 y.o. MRN: [id number]  Chief Complaint  Patient presents with   Diabetes    HPI  Diabetes - no hypoglycemic events. No wounds or sores that are not healing well. No increased thirst or urination. Checking glucose at home. Taking medications as prescribed without any side effects.  The invoke a met has been a little bit pricey but he has been paying it.  He does have an appointment in September with the podiatrist.  He also mentioned that he has noticed some wheezing in the mornings for about the last month as well as some increased shortness of breath.  No significant sputum production or change.  No fever, chill, rash, GI symptoms.  He also has bilateral cerumen impaction-he would like that irrigated today.  Hypertension- Pt denies chest pain, SOB, dizziness, or heart palpitations.  Taking meds as directed w/o problems.  Denies medication side effects.       ROS    Objective:     BP 136/70   Pulse 77   Wt 164 lb (74.4 kg)   SpO2 98%   BMI 26.47 kg/m    Physical Exam Constitutional:      Appearance: He is well-developed.  HENT:     Head: Normocephalic and atraumatic.  Cardiovascular:     Rate and Rhythm: Normal rate and regular rhythm.     Heart sounds: Normal heart sounds.  Pulmonary:     Effort: Pulmonary effort is normal.     Breath sounds: Normal breath sounds.  Skin:    General: Skin is warm and dry.  Neurological:     Mental Status: He is alert and oriented to person, place, and time.  Psychiatric:        Behavior: Behavior normal.      Results for orders placed or performed in visit on 11/26/21  POCT HgB A1C  Result Value Ref Range   Hemoglobin A1C     HbA1c POC (<> result, manual entry)     HbA1c, POC (prediabetic range)     HbA1c, POC (controlled diabetic range) 7.8 (A) 0.0 - 7.0 %      The 10-year ASCVD risk score (Arnett DK, et  al., 2019) is: 37.1%    Assessment & Plan:   Problem List Items Addressed This Visit       Cardiovascular and Mediastinum   ESSENTIAL HYPERTENSION, BENIGN   Aortic atherosclerosis ([place])    Continue pravastatin         Respiratory   Restrictive lung disease    He does have a history of restrictive lung disease but I am concerned that he is been having increased shortness of breath and intermittent wheezing even though he has not had any increased cough or sputum production.  Recommend CT for further work-up he is a former smoker and quit about 7 years ago.      Relevant Orders   CT Chest Wo Contrast     Endocrine   Well controlled type 2 diabetes mellitus with peripheral neuropathy ([place]) - Primary    A1c looks so much better today at 7.8.  He is really come along way the last couple months.  Continue with current regimen call if any problems or concerns.  Am worried about the medication causing him so much.  Continue statin.  He does have an appointment in September with the podiatrist.  Relevant Medications   gabapentin (NEURONTIN) 600 MG tablet   Other Relevant Orders   POCT HgB A1C (Completed)   Diabetic peripheral neuropathy (HCC)   Relevant Medications   gabapentin (NEURONTIN) 600 MG tablet   Other Visit Diagnoses     Bilateral impacted cerumen           Indication: Cerumen impaction of the ear(s) Medical necessity statement: On physical examination, cerumen impairs clinically significant portions of the external auditory canal, and tympanic membrane. Noted obstructive, copious cerumen that cannot be removed without magnification and instrumentation. Consent: Discussed benefits and risks of procedure and verbal consent obtained Procedure: Patient was prepped for the procedure. Utilized an otoscope to assess and take note of the ear canal, the tympanic membrane, and the presence, amount, and placement of the cerumen. Gentle water irrigation and soft plastic  curette was utilized to remove cerumen.  Post procedure examination: shows cerumen was completely removed. Patient tolerated procedure well. The patient is made aware that they may experience temporary vertigo, temporary hearing loss, and temporary discomfort. If these symptom last for more than 24 hours to call the clinic or proceed to the ED.    Return in about 3 months (around 03/10/2022) for Diabetes follow-up.    Beatrice Lecher, MD

## 2021-11-26 NOTE — Assessment & Plan Note (Signed)
Continue pravastatin 

## 2021-11-26 NOTE — Assessment & Plan Note (Signed)
He does have a history of restrictive lung disease but I am concerned that he is been having increased shortness of breath and intermittent wheezing even though he has not had any increased cough or sputum production.  Recommend CT for further work-up he is a former smoker and quit about 7 years ago.

## 2021-11-26 NOTE — Assessment & Plan Note (Signed)
A1c looks so much better today at 7.8.  He is really come along way the last couple months.  Continue with current regimen call if any problems or concerns.  Am worried about the medication causing him so much.  Continue statin.  He does have an appointment in September with the podiatrist.

## 2021-11-27 ENCOUNTER — Encounter: Payer: Self-pay | Admitting: General Practice

## 2021-11-29 ENCOUNTER — Telehealth: Payer: Self-pay | Admitting: Family Medicine

## 2021-11-29 DIAGNOSIS — E1142 Type 2 diabetes mellitus with diabetic polyneuropathy: Secondary | ICD-10-CM

## 2021-11-29 NOTE — Telephone Encounter (Signed)
Pt called. He is requesting a refill on his Toujeo. Please send script to [facility].

## 2021-11-30 ENCOUNTER — Other Ambulatory Visit: Payer: Self-pay | Admitting: Family Medicine

## 2021-11-30 DIAGNOSIS — E1142 Type 2 diabetes mellitus with diabetic polyneuropathy: Secondary | ICD-10-CM

## 2021-12-02 MED ORDER — TOUJEO SOLOSTAR 300 UNIT/ML ~~LOC~~ SOPN: 36.0000 [IU] | PEN_INJECTOR | Freq: Every day | SUBCUTANEOUS | 1 refills | Status: DC

## 2021-12-02 NOTE — Telephone Encounter (Signed)
[facility] [place] as he is very low and cannot wait for mail order.  RX sent as requested.  Tiajuana Amass, CMA

## 2021-12-10 ENCOUNTER — Other Ambulatory Visit: Payer: Medicare HMO

## 2021-12-19 ENCOUNTER — Telehealth: Payer: Self-pay | Admitting: Family Medicine

## 2021-12-19 MED ORDER — PREDNISONE 20 MG PO TABS: 40.0000 mg | ORAL_TABLET | Freq: Every day | ORAL | 0 refills | Status: DC

## 2021-12-19 MED ORDER — DOXYCYCLINE HYCLATE 100 MG PO TABS: 100.0000 mg | ORAL_TABLET | Freq: Two times a day (BID) | ORAL | 0 refills | Status: DC

## 2021-12-19 NOTE — Telephone Encounter (Signed)
Medication sent to [facility].  Also make sure that he has scheduled his CT scan.  Meds ordered this encounter  Medications   doxycycline (VIBRA-TABS) 100 MG tablet    Sig: Take 1 tablet (100 mg total) by mouth 2 (two) times daily.    Dispense:  14 tablet    Refill:  0   predniSONE (DELTASONE) 20 MG tablet    Sig: Take 2 tablets (40 mg total) by mouth daily with breakfast.    Dispense:  10 tablet    Refill:  0

## 2021-12-19 NOTE — Telephone Encounter (Signed)
Patient seen 2 weeks ago states chest still hurts told to call back if not better. Requesting an antibotic

## 2021-12-19 NOTE — Telephone Encounter (Signed)
CT is scheduled for 9/18

## 2021-12-20 NOTE — Telephone Encounter (Signed)
Pt made aware of prescriptions at the pharmacy.  Pt expressed understanding.  Tiajuana Amass, CMA

## 2021-12-23 ENCOUNTER — Ambulatory Visit (INDEPENDENT_AMBULATORY_CARE_PROVIDER_SITE_OTHER): Payer: Medicare HMO

## 2021-12-23 DIAGNOSIS — Z720 Tobacco use: Secondary | ICD-10-CM

## 2021-12-23 DIAGNOSIS — J984 Other disorders of lung: Secondary | ICD-10-CM

## 2021-12-23 DIAGNOSIS — R0602 Shortness of breath: Secondary | ICD-10-CM | POA: Diagnosis not present

## 2021-12-23 DIAGNOSIS — J479 Bronchiectasis, uncomplicated: Secondary | ICD-10-CM | POA: Diagnosis not present

## 2021-12-24 DIAGNOSIS — E1142 Type 2 diabetes mellitus with diabetic polyneuropathy: Secondary | ICD-10-CM | POA: Diagnosis not present

## 2021-12-24 DIAGNOSIS — M2041 Other hammer toe(s) (acquired), right foot: Secondary | ICD-10-CM | POA: Diagnosis not present

## 2021-12-24 DIAGNOSIS — L84 Corns and callosities: Secondary | ICD-10-CM | POA: Diagnosis not present

## 2021-12-24 DIAGNOSIS — L859 Epidermal thickening, unspecified: Secondary | ICD-10-CM | POA: Diagnosis not present

## 2021-12-24 DIAGNOSIS — L602 Onychogryphosis: Secondary | ICD-10-CM | POA: Diagnosis not present

## 2021-12-24 DIAGNOSIS — M2042 Other hammer toe(s) (acquired), left foot: Secondary | ICD-10-CM | POA: Diagnosis not present

## 2021-12-25 NOTE — Progress Notes (Signed)
Hi Justin Goodman your CT does show what looks like probably some mild scarring that can happen after an infection particularly in your right upper lung area.  They also noted a lot of plaque and some of your blood vessels including the main vessels around your heart.  Since you have been noticing some increased shortness of breath I really like to get you in with cardiology to make sure that it is not coming from heart disease.  That would not cause the wheezing that you have had but it could cause some increased shortness of breath.  Plesae  let me know if you are okay with referral to cardiology.

## 2022-01-08 ENCOUNTER — Other Ambulatory Visit: Payer: Self-pay | Admitting: Family Medicine

## 2022-03-03 ENCOUNTER — Ambulatory Visit (INDEPENDENT_AMBULATORY_CARE_PROVIDER_SITE_OTHER): Payer: Medicare HMO | Admitting: Family Medicine

## 2022-03-03 ENCOUNTER — Encounter: Payer: Self-pay | Admitting: Family Medicine

## 2022-03-03 VITALS — BP 122/59 | HR 76 | Ht 66.0 in | Wt 168.0 lb

## 2022-03-03 DIAGNOSIS — I10 Essential (primary) hypertension: Secondary | ICD-10-CM | POA: Diagnosis not present

## 2022-03-03 DIAGNOSIS — E1142 Type 2 diabetes mellitus with diabetic polyneuropathy: Secondary | ICD-10-CM

## 2022-03-03 DIAGNOSIS — M48061 Spinal stenosis, lumbar region without neurogenic claudication: Secondary | ICD-10-CM

## 2022-03-03 LAB — POCT GLYCOSYLATED HEMOGLOBIN (HGB A1C): Hemoglobin A1C: 8 % — AB (ref 4.0–5.6)

## 2022-03-03 NOTE — Patient Instructions (Addendum)
Increase Toujeo to 34 units daily. If blood sugars are not consistently under 130 in the mornings then please let us know and we can increase the Toujeo a little more if needed.

## 2022-03-03 NOTE — Progress Notes (Signed)
Established Patient Office Visit  Subjective   Patient ID: Justin Goodman, male    DOB: Aug 12, 1955  Age: 66 y.o. MRN: [id number]  Chief Complaint  Patient presents with   Diabetes    HPI  Diabetes - no hypoglycemic events. No wounds or sores that are not healing well. No increased thirst or urination. Checking glucose at home. Taking medications as prescribed without any side effects.  Currently using 30 units of Tresiba daily.  Eye exam has gone and completed at [facility].  Also recently saw podiatry in September.  They felt that his neuropathy was coming more from his low back and not his feet because he really does not have a lot of pain at night.  It is more when he is up and on his feet.  They did try increasing his gabapentin to 300 mg 3 times a day just to see if this would help.  Still having a lot of pain in his legs that goes from his hips all the way to the bottom of his feet.  It is worse when he sits.  It is there 24/7 it never really stops.  Per the podiatry note he was taking 3 mg of gabapentin.  Per our records he has been filling a prescription for 1200 mg 3 times a day.    ROS    Objective:     BP (!) 122/59   Pulse 76   Ht _0  (1.676 m)   Wt 168 lb (76.2 kg)   SpO2 98%   BMI 27.12 kg/m    Physical Exam Constitutional:      Appearance: He is well-developed.  HENT:     Head: Normocephalic and atraumatic.  Cardiovascular:     Rate and Rhythm: Normal rate and regular rhythm.     Heart sounds: Normal heart sounds.  Pulmonary:     Effort: Pulmonary effort is normal.     Breath sounds: Normal breath sounds.  Skin:    General: Skin is warm and dry.  Neurological:     Mental Status: He is alert and oriented to person, place, and time.  Psychiatric:        Behavior: Behavior normal.      Results for orders placed or performed in visit on 03/03/22  POCT glycosylated hemoglobin (Hb A1C)  Result Value Ref Range   Hemoglobin A1C 8.0 (A) 4.0 - 5.6 %    HbA1c POC (<> result, manual entry)     HbA1c, POC (prediabetic range)     HbA1c, POC (controlled diabetic range)        The 10-year ASCVD risk score (Arnett DK, et al., 2019) is: 31.7%    Assessment & Plan:   Problem List Items Addressed This Visit       Cardiovascular and Mediastinum   Essential hypertension, benign - Primary    Well controlled. Continue current regimen. Follow up in  6 mo         Endocrine   Well controlled type 2 diabetes mellitus with peripheral neuropathy (HCC)    A1c is back up a little bit today at 8.0.  It did look better last time at 7.8.  He says there really have not been any changes to his diet or his regimen.  He is been consistently giving 30 units of Toujeo.  Will increase dose to 34 units daily.  Continue invoke a met.      Relevant Orders   POCT glycosylated hemoglobin (Hb A1C) (Completed)  Other   Spinal stenosis of lumbar region    Already on max dose of gabapentin.  I can't go any higher. Could consider Pain management. He doesn't want anymore surgery on his back.        Return in about 3 months (around 06/03/2022) for Diabetes follow-up.    Beatrice Lecher, MD

## 2022-03-03 NOTE — Assessment & Plan Note (Signed)
Well controlled. Continue current regimen. Follow up in  6 mo  

## 2022-03-03 NOTE — Assessment & Plan Note (Signed)
Already on max dose of gabapentin.  I can't go any higher. Could consider Pain management. He doesn't want anymore surgery on his back.

## 2022-03-03 NOTE — Assessment & Plan Note (Addendum)
A1c is back up a little bit today at 8.0.  It did look better last time at 7.8.  He says there really have not been any changes to his diet or his regimen.  He is been consistently giving 30 units of Toujeo.  Will increase dose to 34 units daily.  Continue invoke a met.

## 2022-03-13 ENCOUNTER — Other Ambulatory Visit: Payer: Self-pay | Admitting: Family Medicine

## 2022-03-25 DIAGNOSIS — L859 Epidermal thickening, unspecified: Secondary | ICD-10-CM | POA: Diagnosis not present

## 2022-03-25 DIAGNOSIS — I739 Peripheral vascular disease, unspecified: Secondary | ICD-10-CM | POA: Diagnosis not present

## 2022-03-25 DIAGNOSIS — M2042 Other hammer toe(s) (acquired), left foot: Secondary | ICD-10-CM | POA: Diagnosis not present

## 2022-03-25 DIAGNOSIS — L602 Onychogryphosis: Secondary | ICD-10-CM | POA: Diagnosis not present

## 2022-03-25 DIAGNOSIS — M2041 Other hammer toe(s) (acquired), right foot: Secondary | ICD-10-CM | POA: Diagnosis not present

## 2022-03-25 DIAGNOSIS — E1142 Type 2 diabetes mellitus with diabetic polyneuropathy: Secondary | ICD-10-CM | POA: Diagnosis not present

## 2022-04-03 ENCOUNTER — Other Ambulatory Visit: Payer: Self-pay | Admitting: *Deleted

## 2022-04-03 DIAGNOSIS — E1142 Type 2 diabetes mellitus with diabetic polyneuropathy: Secondary | ICD-10-CM

## 2022-04-03 MED ORDER — BD PEN NEEDLE MINI U/F 31G X 5 MM MISC: 4 refills | Status: DC

## 2022-04-10 ENCOUNTER — Other Ambulatory Visit: Payer: Self-pay | Admitting: Family Medicine

## 2022-04-10 DIAGNOSIS — M48062 Spinal stenosis, lumbar region with neurogenic claudication: Secondary | ICD-10-CM

## 2022-05-13 DIAGNOSIS — E1142 Type 2 diabetes mellitus with diabetic polyneuropathy: Secondary | ICD-10-CM | POA: Diagnosis not present

## 2022-06-03 ENCOUNTER — Ambulatory Visit (INDEPENDENT_AMBULATORY_CARE_PROVIDER_SITE_OTHER): Payer: Medicare HMO | Admitting: Family Medicine

## 2022-06-03 ENCOUNTER — Encounter: Payer: Self-pay | Admitting: Family Medicine

## 2022-06-03 VITALS — BP 123/53 | HR 74 | Ht 66.0 in | Wt 168.0 lb

## 2022-06-03 DIAGNOSIS — I700 Atherosclerosis of aorta: Secondary | ICD-10-CM

## 2022-06-03 DIAGNOSIS — E1142 Type 2 diabetes mellitus with diabetic polyneuropathy: Secondary | ICD-10-CM | POA: Diagnosis not present

## 2022-06-03 DIAGNOSIS — J441 Chronic obstructive pulmonary disease with (acute) exacerbation: Secondary | ICD-10-CM | POA: Diagnosis not present

## 2022-06-03 DIAGNOSIS — I10 Essential (primary) hypertension: Secondary | ICD-10-CM | POA: Diagnosis not present

## 2022-06-03 DIAGNOSIS — F5101 Primary insomnia: Secondary | ICD-10-CM

## 2022-06-03 DIAGNOSIS — M48062 Spinal stenosis, lumbar region with neurogenic claudication: Secondary | ICD-10-CM

## 2022-06-03 DIAGNOSIS — J4489 Other specified chronic obstructive pulmonary disease: Secondary | ICD-10-CM | POA: Diagnosis not present

## 2022-06-03 DIAGNOSIS — M48061 Spinal stenosis, lumbar region without neurogenic claudication: Secondary | ICD-10-CM

## 2022-06-03 DIAGNOSIS — E785 Hyperlipidemia, unspecified: Secondary | ICD-10-CM | POA: Diagnosis not present

## 2022-06-03 LAB — POCT GLYCOSYLATED HEMOGLOBIN (HGB A1C): Hemoglobin A1C: 8.7 % — AB (ref 4.0–5.6)

## 2022-06-03 MED ORDER — TIZANIDINE HCL 4 MG PO TABS: ORAL_TABLET | ORAL | 0 refills | Status: DC

## 2022-06-03 MED ORDER — AMITRIPTYLINE HCL 75 MG PO TABS: 75.0000 mg | ORAL_TABLET | Freq: Every evening | ORAL | 3 refills | Status: DC | PRN

## 2022-06-03 MED ORDER — LOSARTAN POTASSIUM 25 MG PO TABS: 25.0000 mg | ORAL_TABLET | Freq: Every day | ORAL | 3 refills | Status: DC

## 2022-06-03 MED ORDER — PRAVASTATIN SODIUM 20 MG PO TABS: ORAL_TABLET | ORAL | 3 refills | Status: DC

## 2022-06-03 MED ORDER — DOXYCYCLINE HYCLATE 100 MG PO TABS: 100.0000 mg | ORAL_TABLET | Freq: Two times a day (BID) | ORAL | 0 refills | Status: DC

## 2022-06-03 MED ORDER — METOPROLOL TARTRATE 50 MG PO TABS: 25.0000 mg | ORAL_TABLET | Freq: Two times a day (BID) | ORAL | 3 refills | Status: DC

## 2022-06-03 MED ORDER — MELOXICAM 7.5 MG PO TABS: 7.5000 mg | ORAL_TABLET | Freq: Two times a day (BID) | ORAL | 1 refills | Status: DC

## 2022-06-03 MED ORDER — TOUJEO SOLOSTAR 300 UNIT/ML ~~LOC~~ SOPN: 40.0000 [IU] | PEN_INJECTOR | Freq: Every day | SUBCUTANEOUS | 1 refills | Status: DC

## 2022-06-03 MED ORDER — INVOKAMET XR 50-1000 MG PO TB24: 1.0000 | ORAL_TABLET | Freq: Every day | ORAL | 1 refills | Status: DC

## 2022-06-03 MED ORDER — TRAZODONE HCL 150 MG PO TABS: ORAL_TABLET | ORAL | 1 refills | Status: DC

## 2022-06-03 NOTE — Assessment & Plan Note (Signed)
Continue amitriptyline.  Refill sent to pharmacy today.

## 2022-06-03 NOTE — Assessment & Plan Note (Addendum)
Continue daily pravastatin.  Due to update lipid levels.  Lab order placed.  Lab Results  Component Value Date   CHOL 152 06/26/2020   HDL 32 (L) 06/26/2020   LDLCALC 95 06/26/2020   LDLDIRECT 97 09/21/2007   TRIG 156 (H) 06/26/2020   CHOLHDL 4.8 06/26/2020

## 2022-06-03 NOTE — Assessment & Plan Note (Signed)
BP looks great today!!

## 2022-06-03 NOTE — Assessment & Plan Note (Signed)
Will treat acute flare.  If not improving in 1 week please let us know and like to get him scheduled for spirometry this spring.

## 2022-06-03 NOTE — Progress Notes (Signed)
Established Patient Office Visit  Subjective   Patient ID: Justin Goodman, male    DOB: 1955-12-01  Age: 67 y.o. MRN: [id number]  Chief Complaint  Patient presents with   Hypertension   Diabetes    HPI  Hypertension- Pt denies chest pain, SOB, dizziness, or heart palpitations.  Taking meds as directed w/o problems.  Denies medication side effects.    Diabetes - no hypoglycemic events. No wounds or sores that are not healing well. No increased thirst or urination. Checking glucose at home. Taking medications as prescribed without any side effects.  He has not been great with his diet lately.  He has been a little off track with that wants to be able to work to get it back under control.  He also reports cough with increased mucus production over the last 3 weeks.  No fever or chills.  No sinus symptoms.  No GI symptoms.    ROS    Objective:     BP (!) 123/53   Pulse 74   Ht '5\' 6"'$  (1.676 m)   Wt 168 lb (76.2 kg)   SpO2 98%   BMI 27.12 kg/m    Physical Exam Constitutional:      Appearance: He is well-developed.  HENT:     Head: Normocephalic and atraumatic.     Right Ear: Tympanic membrane, ear canal and external ear normal.     Left Ear: Tympanic membrane, ear canal and external ear normal.     Nose: Nose normal.     Mouth/Throat:     Pharynx: Oropharynx is clear.  Eyes:     Conjunctiva/sclera: Conjunctivae normal.     Pupils: Pupils are equal, round, and reactive to light.  Neck:     Thyroid: No thyromegaly.  Cardiovascular:     Rate and Rhythm: Normal rate and regular rhythm.     Heart sounds: Normal heart sounds.  Pulmonary:     Effort: Pulmonary effort is normal.     Breath sounds: Normal breath sounds.  Musculoskeletal:     Cervical back: Neck supple.  Lymphadenopathy:     Cervical: No cervical adenopathy.  Skin:    General: Skin is warm and dry.  Neurological:     Mental Status: He is alert and oriented to person, place, and time.  Psychiatric:         Behavior: Behavior normal.      Results for orders placed or performed in visit on 06/03/22  POCT glycosylated hemoglobin (Hb A1C)  Result Value Ref Range   Hemoglobin A1C 8.7 (A) 4.0 - 5.6 %   HbA1c POC (<> result, manual entry)     HbA1c, POC (prediabetic range)     HbA1c, POC (controlled diabetic range)        The 10-year ASCVD risk score (Arnett DK, et al., 2019) is: 32.1%    Assessment & Plan:   Problem List Items Addressed This Visit       Cardiovascular and Mediastinum   Essential hypertension, benign - Primary    BP looks great today!!       Relevant Medications   losartan (COZAAR) 25 MG tablet   metoprolol tartrate (LOPRESSOR) 50 MG tablet   pravastatin (PRAVACHOL) 20 MG tablet   Other Relevant Orders   Lipid Panel w/reflex Direct LDL   COMPLETE METABOLIC PANEL WITH GFR   CBC   Urine Microalbumin w/creat. ratio   Aortic atherosclerosis (HCC)    Continue daily pravastatin.  Due to update lipid  levels.  Lab order placed.  Lab Results  Component Value Date   CHOL 152 06/26/2020   HDL 32 (L) 06/26/2020   LDLCALC 95 06/26/2020   LDLDIRECT 97 09/21/2007   TRIG 156 (H) 06/26/2020   CHOLHDL 4.8 06/26/2020        Relevant Medications   losartan (COZAAR) 25 MG tablet   metoprolol tartrate (LOPRESSOR) 50 MG tablet   pravastatin (PRAVACHOL) 20 MG tablet     Respiratory   Asthma with COPD (chronic obstructive pulmonary disease)    Will treat acute flare.  If not improving in 1 week please let us know and like to get him scheduled for spirometry this spring.        Endocrine   Well controlled type 2 diabetes mellitus with peripheral neuropathy ([place])    Diabetes is actually uncontrolled right now.  He really did not want to make any changes today so we discussed getting back on track with his diet and eating more vegetables.  He is not as physically active as he used to be.  Plan to follow back up in 3 months but if A1c is not back under 8 then we  will need to make medication changes I think he would be a great candidate for GLP-1 if his insurance will cover the medication.      Relevant Medications   amitriptyline (ELAVIL) 75 MG tablet   insulin glargine, 1 Unit Dial, (TOUJEO SOLOSTAR) 300 UNIT/ML Solostar Pen   Canagliflozin-metFORMIN HCl ER (INVOKAMET XR) 50-1000 MG TB24   losartan (COZAAR) 25 MG tablet   pravastatin (PRAVACHOL) 20 MG tablet   tiZANidine (ZANAFLEX) 4 MG tablet   traZODone (DESYREL) 150 MG tablet   Other Relevant Orders   Lipid Panel w/reflex Direct LDL   COMPLETE METABOLIC PANEL WITH GFR   CBC   POCT glycosylated hemoglobin (Hb A1C) (Completed)   Urine Microalbumin w/creat. ratio   Diabetic peripheral neuropathy (HCC)   Relevant Medications   amitriptyline (ELAVIL) 75 MG tablet   insulin glargine, 1 Unit Dial, (TOUJEO SOLOSTAR) 300 UNIT/ML Solostar Pen   Canagliflozin-metFORMIN HCl ER (INVOKAMET XR) 50-1000 MG TB24   losartan (COZAAR) 25 MG tablet   pravastatin (PRAVACHOL) 20 MG tablet   tiZANidine (ZANAFLEX) 4 MG tablet   traZODone (DESYREL) 150 MG tablet     Other   Spinal stenosis of lumbar region   Relevant Medications   amitriptyline (ELAVIL) 75 MG tablet   Lumbar stenosis with neurogenic claudication    Continue amitriptyline.  Refill sent to pharmacy today.      Relevant Medications   amitriptyline (ELAVIL) 75 MG tablet   meloxicam (MOBIC) 7.5 MG tablet   tiZANidine (ZANAFLEX) 4 MG tablet   traZODone (DESYREL) 150 MG tablet   Insomnia   Relevant Medications   traZODone (DESYREL) 150 MG tablet   Hyperlipidemia   Relevant Medications   losartan (COZAAR) 25 MG tablet   metoprolol tartrate (LOPRESSOR) 50 MG tablet   pravastatin (PRAVACHOL) 20 MG tablet   Other Visit Diagnoses     COPD exacerbation (HCC)       Relevant Medications   doxycycline (VIBRA-TABS) 100 MG tablet       COPD exacerbation-we will go ahead and treat with doxycycline.  He just mostly has increased cough and  sputum production without significant shortness of breath.  Call if not better in 1 week.  I spent 40 minutes on the day of the encounter to include pre-visit record review, face-to-face time with the  patient and post visit ordering of test.   Return in about 13 weeks (around 09/02/2022) for Diabetes follow-up.    Beatrice Lecher, MD

## 2022-06-03 NOTE — Assessment & Plan Note (Signed)
Diabetes is actually uncontrolled right now.  He really did not want to make any changes today so we discussed getting back on track with his diet and eating more vegetables.  He is not as physically active as he used to be.  Plan to follow back up in 3 months but if A1c is not back under 8 then we will need to make medication changes I think he would be a great candidate for GLP-1 if his insurance will cover the medication.

## 2022-06-05 DIAGNOSIS — E1142 Type 2 diabetes mellitus with diabetic polyneuropathy: Secondary | ICD-10-CM | POA: Diagnosis not present

## 2022-06-05 DIAGNOSIS — I10 Essential (primary) hypertension: Secondary | ICD-10-CM | POA: Diagnosis not present

## 2022-06-06 LAB — COMPLETE METABOLIC PANEL WITH GFR
AG Ratio: 1.5 (calc) (ref 1.0–2.5)
ALT: 10 U/L (ref 9–46)
AST: 13 U/L (ref 10–35)
Albumin: 4.4 g/dL (ref 3.6–5.1)
Alkaline phosphatase (APISO): 83 U/L (ref 35–144)
BUN: 16 mg/dL (ref 7–25)
CO2: 28 mmol/L (ref 20–32)
Calcium: 9.3 mg/dL (ref 8.6–10.3)
Chloride: 102 mmol/L (ref 98–110)
Creat: 0.85 mg/dL (ref 0.70–1.35)
Globulin: 2.9 g/dL (calc) (ref 1.9–3.7)
Glucose, Bld: 302 mg/dL — ABNORMAL HIGH (ref 65–99)
Potassium: 4.8 mmol/L (ref 3.5–5.3)
Sodium: 137 mmol/L (ref 135–146)
Total Bilirubin: 0.5 mg/dL (ref 0.2–1.2)
Total Protein: 7.3 g/dL (ref 6.1–8.1)
eGFR: 95 mL/min/{1.73_m2} (ref >=60)

## 2022-06-06 LAB — LIPID PANEL W/REFLEX DIRECT LDL
Cholesterol: 194 mg/dL (ref <200)
HDL: 31 mg/dL — ABNORMAL LOW (ref >=40)
LDL Cholesterol (Calc): 121 mg/dL (calc) — ABNORMAL HIGH
Non-HDL Cholesterol (Calc): 163 mg/dL (calc) — ABNORMAL HIGH (ref <130)
Total CHOL/HDL Ratio: 6.3 (calc) — ABNORMAL HIGH (ref <5.0)
Triglycerides: 295 mg/dL — ABNORMAL HIGH (ref <150)

## 2022-06-06 LAB — CBC
HCT: 47.1 % (ref 38.5–50.0)
Hemoglobin: 15.6 g/dL (ref 13.2–17.1)
MCH: 27.8 pg (ref 27.0–33.0)
MCHC: 33.1 g/dL (ref 32.0–36.0)
MCV: 83.8 fL (ref 80.0–100.0)
MPV: 9 fL (ref 7.5–12.5)
Platelets: 176 10*3/uL (ref 140–400)
RBC: 5.62 10*6/uL (ref 4.20–5.80)
RDW: 13.3 % (ref 11.0–15.0)
WBC: 4.6 10*3/uL (ref 3.8–10.8)

## 2022-06-06 LAB — MICROALBUMIN / CREATININE URINE RATIO
Creatinine, Urine: 36 mg/dL (ref 20–320)
Microalb Creat Ratio: 647 mcg/mg creat — ABNORMAL HIGH (ref <30)
Microalb, Ur: 23.3 mg/dL

## 2022-06-06 LAB — EXTRA URINE SPECIMEN

## 2022-06-09 NOTE — Progress Notes (Signed)
Hi Mr. Addams, your LDL cholesterol is up about 25 points from last time.  Please continue to make sure you are taking the pravastatin twice a week.  You are tolerating well you can always try going up to 3 times a week.  But if you feel any increase in muscle aches then encouraged you to go back down to twice a week.  Your glucose was over 300 the day that you came in.  Really encouraged her to continue to work on healthy food choices.  You are still spilling some excess protein in the urine.  Lease increase your insulin to 32 units and then let me know what your sugars are doing over the next week.  If we need to go up from there then we can.  The plan will be to recheck your urine again in about 3 weeks to see if you are still spilling a lot of protein.  If you are then it will need additional workup.

## 2022-06-12 ENCOUNTER — Other Ambulatory Visit: Payer: Self-pay

## 2022-06-12 DIAGNOSIS — E1142 Type 2 diabetes mellitus with diabetic polyneuropathy: Secondary | ICD-10-CM

## 2022-06-19 DIAGNOSIS — Z1330 Encounter for screening examination for mental health and behavioral disorders, unspecified: Secondary | ICD-10-CM | POA: Diagnosis not present

## 2022-06-19 DIAGNOSIS — I998 Other disorder of circulatory system: Secondary | ICD-10-CM | POA: Diagnosis not present

## 2022-06-19 DIAGNOSIS — I739 Peripheral vascular disease, unspecified: Secondary | ICD-10-CM | POA: Diagnosis not present

## 2022-06-19 DIAGNOSIS — M79606 Pain in leg, unspecified: Secondary | ICD-10-CM | POA: Diagnosis not present

## 2022-06-23 ENCOUNTER — Other Ambulatory Visit: Payer: Self-pay

## 2022-06-23 DIAGNOSIS — E1142 Type 2 diabetes mellitus with diabetic polyneuropathy: Secondary | ICD-10-CM

## 2022-06-23 MED ORDER — ACCU-CHEK GUIDE VI STRP: ORAL_STRIP | 6 refills | Status: AC

## 2022-06-23 MED ORDER — ACCU-CHEK GUIDE W/DEVICE KIT: PACK | 0 refills | Status: AC

## 2022-06-23 MED ORDER — ACCU-CHEK FASTCLIX LANCETS MISC: 3 refills | Status: AC

## 2022-06-24 DIAGNOSIS — I739 Peripheral vascular disease, unspecified: Secondary | ICD-10-CM | POA: Diagnosis not present

## 2022-06-24 DIAGNOSIS — L602 Onychogryphosis: Secondary | ICD-10-CM | POA: Diagnosis not present

## 2022-06-24 DIAGNOSIS — M2041 Other hammer toe(s) (acquired), right foot: Secondary | ICD-10-CM | POA: Diagnosis not present

## 2022-06-24 DIAGNOSIS — M2042 Other hammer toe(s) (acquired), left foot: Secondary | ICD-10-CM | POA: Diagnosis not present

## 2022-06-24 DIAGNOSIS — E1142 Type 2 diabetes mellitus with diabetic polyneuropathy: Secondary | ICD-10-CM | POA: Diagnosis not present

## 2022-06-24 DIAGNOSIS — L859 Epidermal thickening, unspecified: Secondary | ICD-10-CM | POA: Diagnosis not present

## 2022-07-14 ENCOUNTER — Other Ambulatory Visit: Payer: Self-pay | Admitting: Family Medicine

## 2022-07-14 DIAGNOSIS — E1142 Type 2 diabetes mellitus with diabetic polyneuropathy: Secondary | ICD-10-CM

## 2022-07-15 ENCOUNTER — Telehealth: Payer: Self-pay | Admitting: Family Medicine

## 2022-07-15 DIAGNOSIS — E1142 Type 2 diabetes mellitus with diabetic polyneuropathy: Secondary | ICD-10-CM

## 2022-07-15 MED ORDER — GABAPENTIN 600 MG PO TABS: 1200.0000 mg | ORAL_TABLET | Freq: Three times a day (TID) | ORAL | 3 refills | Status: DC

## 2022-07-15 NOTE — Telephone Encounter (Signed)
[facility]

## 2022-07-15 NOTE — Telephone Encounter (Signed)
Patient called office requesting refill on gabapentin (NEURONTIN) 600 MG tablet . Patient states the pharmacy will not refill his prescription and Dr. Threasa Beards refused to fill the script as well. [facility] on [address] Please advise.

## 2022-07-17 ENCOUNTER — Telehealth: Payer: Self-pay | Admitting: Family Medicine

## 2022-07-17 NOTE — Telephone Encounter (Signed)
Contacted Justin Goodman to schedule their annual wellness visit. Call back at later date: 07/31/2022.  Cira Servant Patient Engineer, production II Direct Dial: [phone]

## 2022-07-22 ENCOUNTER — Other Ambulatory Visit: Payer: Self-pay | Admitting: Family Medicine

## 2022-07-22 DIAGNOSIS — E1142 Type 2 diabetes mellitus with diabetic polyneuropathy: Secondary | ICD-10-CM

## 2022-08-06 NOTE — Progress Notes (Unsigned)
Complete physical exam  Patient: Justin Goodman   DOB: Aug 09, 1955   67 y.o. Male  MRN: [id number]  Subjective:    No chief complaint on file.   Justin Goodman is a 67 y.o. male who presents today for a complete physical exam. He reports consuming a {diet types:17450} diet. {types:19826} He generally feels {DESC; WELL/FAIRLY WELL/POORLY:18703}. He reports sleeping {DESC; WELL/FAIRLY WELL/POORLY:18703}. He {does/does not:200015} have additional problems to discuss today.    Most recent fall risk assessment:    06/03/2022    9:14 AM  Fall Risk   Falls in the past year? 0  Number falls in past yr: 0  Injury with Fall? 0  Follow up Falls evaluation completed     Most recent depression screenings:    08/20/2021    2:35 PM 05/30/2021    9:30 AM  PHQ 2/9 Scores  PHQ - 2 Score 0 0    {VISON DENTAL STD PSA (Optional):27386}  {History (Optional):23778}  Patient Care Team: Agapito Games, MD as PCP - General (Family Medicine) Gabriel Carina, [facility] as Pharmacist (Pharmacist)   Outpatient Medications Prior to Visit  Medication Sig   Accu-Chek FastClix Lancets MISC Check blood glucose 4 times daily Diagnosis diabetes E11.40   amitriptyline (ELAVIL) 75 MG tablet Take 1 tablet (75 mg total) by mouth at bedtime as needed for sleep.   blood glucose meter kit and supplies Dispense based on patient and insurance preference. Use up to four times daily as directed. Dx: E11.9   Blood Glucose Monitoring Suppl (ACCU-CHEK GUIDE) w/Device KIT Use up to 4 times daily. Dx: E11.40   Canagliflozin-metFORMIN HCl ER (INVOKAMET XR) 50-1000 MG TB24 Take 1 tablet by mouth daily.   gabapentin (NEURONTIN) 600 MG tablet Take 2 tablets (1,200 mg total) by mouth 3 (three) times daily.   glucose blood (ACCU-CHEK GUIDE) test strip Use up to 4 times daily. Dx: E11.40   insulin glargine, 1 Unit Dial, (TOUJEO SOLOSTAR) 300 UNIT/ML Solostar Pen INJECT 36 UNITS INTO THE SKIN DAILY.   Insulin Pen Needle (B-D  UF III MINI PEN NEEDLES) 31G X 5 MM MISC USE FOR DAILY INSULIN INJECTION AS DIRECTED BY PHYSICIAN Dx: W09.81   losartan (COZAAR) 25 MG tablet Take 1 tablet (25 mg total) by mouth daily.   meloxicam (MOBIC) 7.5 MG tablet Take 1 tablet (7.5 mg total) by mouth 2 (two) times daily.   metoprolol tartrate (LOPRESSOR) 50 MG tablet Take 0.5 tablets (25 mg total) by mouth 2 (two) times daily.   omeprazole (PRILOSEC) 40 MG capsule TAKE 1 CAPSULE BY MOUTH EVERY DAY   pravastatin (PRAVACHOL) 20 MG tablet TAKE 1 TABLET BY MOUTH TWICE PER WEEK AT BEDTIME.   tiZANidine (ZANAFLEX) 4 MG tablet TAKE 1 TABLET BY MOUTH TWICE A DAY AS NEEDED FOR MUSCLE SPASM   traZODone (DESYREL) 150 MG tablet TAKE 1 TABLET BY MOUTH EVERYDAY AT BEDTIME   UltiCare Alcohol Swabs 70 % PADS CHECK BLOOD GLUCOSE 4 TIMES DAILY DIAGNOSIS DIABETES E11.40   No facility-administered medications prior to visit.    ROS        Objective:     There were no vitals taken for this visit. {Vitals History (Optional):23777}  Physical Exam   No results found for any visits on 08/07/22. {Show previous labs (optional):23779}    Assessment & Plan:    Routine Health Maintenance and Physical Exam  Immunization History  Administered Date(s) Administered   Fluad Quad(high Dose 65+) 12/22/2018, 01/10/2020, 01/16/2021   Influenza,  High Dose Seasonal PF 01/08/2017, 01/20/2018   Influenza,inj,Quad PF,6+ Mos 01/21/2013, 01/10/2014, 12/28/2014, 01/10/2016   Moderna Sars-Covid-2 Vaccination 07/25/2019, 08/22/2019   PNEUMOCOCCAL CONJUGATE-20 07/11/2020   Pneumococcal Polysaccharide-23 08/19/2012   Tdap 06/26/2015    Health Maintenance  Topic Date Due   COLON CANCER SCREENING ANNUAL FOBT  08/13/2017   OPHTHALMOLOGY EXAM  08/03/2021   Medicare Annual Wellness (AWV)  08/08/2021   COLONOSCOPY (Pts 45-62yrs Insurance coverage will need to be confirmed)  06/04/2023 (Originally 04/03/2021)   COVID-19 Vaccine (3 - 2023-24 season) 06/19/2023  (Originally 12/06/2021)   Zoster Vaccines- Shingrix (1 of 2) 09/01/2023 (Originally 08/29/2003)   INFLUENZA VACCINE  11/06/2022   HEMOGLOBIN A1C  12/02/2022   Lung Cancer Screening  12/24/2022   Diabetic kidney evaluation - eGFR measurement  06/04/2023   Diabetic kidney evaluation - Urine ACR  06/04/2023   FOOT EXAM  06/04/2023   DTaP/Tdap/Td (2 - Td or Tdap) 06/25/2025   Pneumonia Vaccine 43+ Years old  Completed   Hepatitis C Screening  Completed   HPV VACCINES  Aged Out    Discussed health benefits of physical activity, and encouraged him to engage in regular exercise appropriate for his age and condition.  Problem List Items Addressed This Visit   None Visit Diagnoses     Wellness examination    -  Primary      No follow-ups on file.     Nani Gasser, MD

## 2022-08-07 ENCOUNTER — Ambulatory Visit (INDEPENDENT_AMBULATORY_CARE_PROVIDER_SITE_OTHER): Payer: Medicare HMO | Admitting: Family Medicine

## 2022-08-07 ENCOUNTER — Encounter: Payer: Self-pay | Admitting: Family Medicine

## 2022-08-07 VITALS — BP 106/54 | HR 76 | Ht 66.0 in | Wt 167.0 lb

## 2022-08-07 DIAGNOSIS — Z1211 Encounter for screening for malignant neoplasm of colon: Secondary | ICD-10-CM

## 2022-08-07 DIAGNOSIS — E1142 Type 2 diabetes mellitus with diabetic polyneuropathy: Secondary | ICD-10-CM | POA: Diagnosis not present

## 2022-08-07 DIAGNOSIS — Z0000 Encounter for general adult medical examination without abnormal findings: Secondary | ICD-10-CM

## 2022-08-07 MED ORDER — EMPAGLIFLOZIN-METFORMIN HCL ER 25-1000 MG PO TB24
1.0000 | ORAL_TABLET | Freq: Every morning | ORAL | 1 refills | Status: DC
Start: 2022-08-07 — End: 2022-12-30

## 2022-08-07 NOTE — Patient Instructions (Signed)
Please schedule Medicare Wellness with [place].

## 2022-08-08 DIAGNOSIS — E1142 Type 2 diabetes mellitus with diabetic polyneuropathy: Secondary | ICD-10-CM | POA: Diagnosis not present

## 2022-08-09 LAB — MICROALBUMIN / CREATININE URINE RATIO
Creatinine, Urine: 47 mg/dL (ref 20–320)
Microalb Creat Ratio: 328 mg/g creat — ABNORMAL HIGH (ref <30)
Microalb, Ur: 15.4 mg/dL

## 2022-08-11 NOTE — Progress Notes (Signed)
Showing some excess protein in the urine but it is improved compared to 2 months ago.  It is still elevated so I would like to refer him for further workup to a kidney specialist.  He is okay with that then please let me know.  If he has a preference for specific provider or location then please let me know.

## 2022-09-08 ENCOUNTER — Encounter: Payer: Self-pay | Admitting: Family Medicine

## 2022-09-08 ENCOUNTER — Ambulatory Visit (INDEPENDENT_AMBULATORY_CARE_PROVIDER_SITE_OTHER): Payer: Medicare HMO | Admitting: Family Medicine

## 2022-09-08 VITALS — BP 113/53 | HR 89 | Ht 66.0 in | Wt 161.0 lb

## 2022-09-08 DIAGNOSIS — E1142 Type 2 diabetes mellitus with diabetic polyneuropathy: Secondary | ICD-10-CM

## 2022-09-08 DIAGNOSIS — Z1211 Encounter for screening for malignant neoplasm of colon: Secondary | ICD-10-CM

## 2022-09-08 DIAGNOSIS — I10 Essential (primary) hypertension: Secondary | ICD-10-CM | POA: Diagnosis not present

## 2022-09-08 LAB — POCT GLYCOSYLATED HEMOGLOBIN (HGB A1C): Hemoglobin A1C: 7.5 % — AB (ref 4.0–5.6)

## 2022-09-08 MED ORDER — BD PEN NEEDLE MINI U/F 31G X 5 MM MISC: 4 refills | Status: DC

## 2022-09-08 MED ORDER — TOUJEO SOLOSTAR 300 UNIT/ML ~~LOC~~ SOPN
PEN_INJECTOR | SUBCUTANEOUS | 3 refills | Status: DC
Start: 2022-09-08 — End: 2023-04-10

## 2022-09-08 NOTE — Progress Notes (Signed)
Established Patient Office Visit  Subjective   Patient ID: Justin Goodman, male    DOB: 1955/07/01  Age: 67 y.o. MRN: [id number]  Chief Complaint  Patient presents with   Diabetes    Pt had DM eye exam done at [facility] 4 months ago. Will send request to:    Dr. Verita Schneiders optometrist  Ph:  [phone]     HPI  Diabetes - no hypoglycemic events. No wounds or sores that are not healing well. No increased thirst or urination. Checking glucose at home. Taking medications as prescribed without any side effects. On 30 units of Toujeo.  Still on invoke a met.  Has not switched to empaglifozin/metformin.  We switched off the invoke a met because he felt like it was causing him to wake up in the middle the night.   Hypertension- Pt denies chest pain, SOB, dizziness, or heart palpitations.  Taking meds as directed w/o problems.  Denies medication side effects.       ROS    Objective:     BP (!) 113/53   Pulse 89   Ht 5\' 6"  (1.676 m)   Wt 161 lb (73 kg)   SpO2 98%   BMI 25.99 kg/m    Physical Exam Constitutional:      Appearance: He is well-developed.  HENT:     Head: Normocephalic and atraumatic.  Cardiovascular:     Rate and Rhythm: Normal rate and regular rhythm.     Heart sounds: Normal heart sounds.  Pulmonary:     Effort: Pulmonary effort is normal.     Breath sounds: Normal breath sounds.  Skin:    General: Skin is warm and dry.  Neurological:     Mental Status: He is alert and oriented to person, place, and time.  Psychiatric:        Behavior: Behavior normal.      Results for orders placed or performed in visit on 09/08/22  POCT glycosylated hemoglobin (Hb A1C)  Result Value Ref Range   Hemoglobin A1C 7.5 (A) 4.0 - 5.6 %   HbA1c POC (<> result, manual entry)     HbA1c, POC (prediabetic range)     HbA1c, POC (controlled diabetic range)        The 10-year ASCVD risk score (Arnett DK, et al., 2019) is: 34.2%    Assessment & Plan:   Problem  List Items Addressed This Visit       Cardiovascular and Mediastinum   Essential hypertension, benign    Well controlled. Continue current regimen. Follow up in  6 mo         Endocrine   Well controlled type 2 diabetes mellitus with peripheral neuropathy (HCC) - Primary    A1c looks much better today at 7.5.  Anytime I try to push his insulin he tends to have lows so for now he is doing really well he is not actively exercising because of his neuropathy so I did discuss with him the possibility of chair exercises.  He is still taking the invoke a met he wanted to finish it out since it cost him $70.  As soon as he is done he will switch to the end Kafocin/metformin instead but I did let him know to let us know if it is quite expensive.  He might qualify for patient assistance.  Will call again to [facility] for his eye exam      Relevant Medications   Insulin Pen Needle (B-D UF III MINI  PEN NEEDLES) 31G X 5 MM MISC   insulin glargine, 1 Unit Dial, (TOUJEO SOLOSTAR) 300 UNIT/ML Solostar Pen   Other Relevant Orders   POCT glycosylated hemoglobin (Hb A1C) (Completed)   Other Visit Diagnoses     Colon cancer screening       Relevant Orders   Cologuard       Return in about 3 months (around 12/16/2022) for Diabetes follow-up.    Nani Gasser, MD

## 2022-09-08 NOTE — Assessment & Plan Note (Addendum)
A1c looks much better today at 7.5.  Anytime I try to push his insulin he tends to have lows so for now he is doing really well he is not actively exercising because of his neuropathy so I did discuss with him the possibility of chair exercises.  He is still taking the invoke a met he wanted to finish it out since it cost him $70.  As soon as he is done he will switch to the end Kafocin/metformin instead but I did let him know to let us know if it is quite expensive.  He might qualify for patient assistance.  Will call again to [facility] for his eye exam

## 2022-09-08 NOTE — Assessment & Plan Note (Signed)
Well controlled. Continue current regimen. Follow up in  6 mo  

## 2022-09-08 NOTE — Progress Notes (Signed)
Pt reports that he is taking Invokana-Metformin 50-1000 he said that this cost him $70 and he will finish this first before he starts the Empagliflozin-metformin 25-1000.   He is taking 30 Units of Toujeo

## 2022-09-25 DIAGNOSIS — I739 Peripheral vascular disease, unspecified: Secondary | ICD-10-CM | POA: Diagnosis not present

## 2022-09-25 DIAGNOSIS — L859 Epidermal thickening, unspecified: Secondary | ICD-10-CM | POA: Diagnosis not present

## 2022-09-25 DIAGNOSIS — L602 Onychogryphosis: Secondary | ICD-10-CM | POA: Diagnosis not present

## 2022-09-25 DIAGNOSIS — E1142 Type 2 diabetes mellitus with diabetic polyneuropathy: Secondary | ICD-10-CM | POA: Diagnosis not present

## 2022-10-16 ENCOUNTER — Other Ambulatory Visit: Payer: Self-pay | Admitting: Family Medicine

## 2022-10-16 DIAGNOSIS — M48062 Spinal stenosis, lumbar region with neurogenic claudication: Secondary | ICD-10-CM

## 2022-11-03 ENCOUNTER — Telehealth: Payer: Self-pay | Admitting: Family Medicine

## 2022-11-03 NOTE — Telephone Encounter (Signed)
Rep with [facility] Medicare called. Has this patient been clinically diagnosed with a cardiovascular disease or Diabetes?  He will have to have one of these conditions to remain eligible in this plan. Contact # [phone] Ref #[id number]

## 2022-12-01 ENCOUNTER — Other Ambulatory Visit: Payer: Self-pay | Admitting: Family Medicine

## 2022-12-01 DIAGNOSIS — E1142 Type 2 diabetes mellitus with diabetic polyneuropathy: Secondary | ICD-10-CM

## 2022-12-04 ENCOUNTER — Telehealth: Payer: Self-pay

## 2022-12-04 NOTE — Telephone Encounter (Signed)
Patient came into office to drop off Verification of Chronic Condition form, expressed to patient it may take up to 3-5 business days for completion, stated to patient that he'll be contacted once form is completed, form placed in Dr. Shelah Lewandowsky box,thanks.

## 2022-12-05 NOTE — Telephone Encounter (Signed)
Form completed on my part but he didn't fill out the top part of the form so we cannot fax for him until he does

## 2022-12-10 ENCOUNTER — Other Ambulatory Visit: Payer: Self-pay | Admitting: *Deleted

## 2022-12-10 MED ORDER — ULTICARE ALCOHOL SWABS 70 % PADS: MEDICATED_PAD | 11 refills | Status: DC

## 2022-12-14 ENCOUNTER — Other Ambulatory Visit: Payer: Self-pay | Admitting: Family Medicine

## 2022-12-14 DIAGNOSIS — F5101 Primary insomnia: Secondary | ICD-10-CM

## 2022-12-16 ENCOUNTER — Encounter: Payer: Self-pay | Admitting: Family Medicine

## 2022-12-16 ENCOUNTER — Ambulatory Visit (INDEPENDENT_AMBULATORY_CARE_PROVIDER_SITE_OTHER): Payer: Medicare HMO | Admitting: Family Medicine

## 2022-12-16 VITALS — BP 92/44 | HR 66 | Ht 66.0 in | Wt 159.0 lb

## 2022-12-16 DIAGNOSIS — E1142 Type 2 diabetes mellitus with diabetic polyneuropathy: Secondary | ICD-10-CM

## 2022-12-16 DIAGNOSIS — I10 Essential (primary) hypertension: Secondary | ICD-10-CM

## 2022-12-16 DIAGNOSIS — F5101 Primary insomnia: Secondary | ICD-10-CM | POA: Diagnosis not present

## 2022-12-16 DIAGNOSIS — I959 Hypotension, unspecified: Secondary | ICD-10-CM

## 2022-12-16 DIAGNOSIS — Z23 Encounter for immunization: Secondary | ICD-10-CM

## 2022-12-16 LAB — POCT GLYCOSYLATED HEMOGLOBIN (HGB A1C): Hemoglobin A1C: 7 % — AB (ref 4.0–5.6)

## 2022-12-16 MED ORDER — ESZOPICLONE 2 MG PO TABS
2.0000 mg | ORAL_TABLET | Freq: Every evening | ORAL | 1 refills | Status: DC | PRN
Start: 2022-12-16 — End: 2023-04-15

## 2022-12-16 MED ORDER — PANTOPRAZOLE SODIUM 40 MG PO TBEC: 40.0000 mg | DELAYED_RELEASE_TABLET | Freq: Two times a day (BID) | ORAL | 0 refills | Status: DC

## 2022-12-16 NOTE — Progress Notes (Signed)
Established Patient Office Visit  Subjective   Patient ID: Justin Goodman, male    DOB: 1955/09/18  Age: 67 y.o. MRN: [id number]  Chief Complaint  Patient presents with   Diabetes    HPI GERD - Prilosec is not helping. Still having some reflux in the AM.  He denies any recent diet changes, and he stays away from spicy foods.  He does still drink about 2 cups of coffee in the morning.  And then has 2 bottles of water during the day.  Insomnia - feels the trazodone is not helping for sleep. Falls asleep quickly around 10- PM but then wakes up around   Diabetes - no hypoglycemic events. No wounds or sores that are not healing well. No increased thirst or urination. Checking glucose at home. Taking medications as prescribed without any side effects.    ROS    Objective:     BP (!) 92/44   Pulse 66   Ht 5\' 6"  (1.676 m)   Wt 159 lb (72.1 kg)   SpO2 100%   BMI 25.66 kg/m    Physical Exam Vitals and nursing note reviewed.  Constitutional:      Appearance: Normal appearance.  HENT:     Head: Normocephalic and atraumatic.  Eyes:     Conjunctiva/sclera: Conjunctivae normal.  Cardiovascular:     Rate and Rhythm: Normal rate and regular rhythm.  Pulmonary:     Effort: Pulmonary effort is normal.     Breath sounds: Normal breath sounds.  Skin:    General: Skin is warm and dry.  Neurological:     Mental Status: He is alert.  Psychiatric:        Mood and Affect: Mood normal.      Results for orders placed or performed in visit on 12/16/22  POCT HgB A1C  Result Value Ref Range   Hemoglobin A1C 7.0 (A) 4.0 - 5.6 %   HbA1c POC (<> result, manual entry)     HbA1c, POC (prediabetic range)     HbA1c, POC (controlled diabetic range)        The 10-year ASCVD risk score (Arnett DK, et al., 2019) is: 25.1%    Assessment & Plan:   Problem List Items Addressed This Visit       Cardiovascular and Mediastinum   Essential hypertension, benign    Blood pressure is low  today.  I am going to have him stop the metoprolol completely and continue with losartan 25 mg if it still low we can always split the losartan in half he says he is asymptomatic he does not feel lightheaded dizzy.  He says he feels a little tired but he really thinks it is more from the lack of sleep.        Endocrine   Well controlled type 2 diabetes mellitus with peripheral neuropathy (HCC) - Primary    1C looks great today at 7.0.  Continue with empagliflozin/metformin as well Toujeo.  Follow-up in 3 to 4 months.      Relevant Medications   eszopiclone (LUNESTA) 2 MG TABS tablet   Other Relevant Orders   POCT HgB A1C (Completed)   CMP14+EGFR   Diabetic peripheral neuropathy (HCC)    Follows with Podiatry for care. On gabap and amitriptyline.       Relevant Medications   eszopiclone (LUNESTA) 2 MG TABS tablet   Other Relevant Orders   CMP14+EGFR     Other   Insomnia    He feels  like the trazodone is no longer helpful.  He is getting about 5 hours of sleep most nights but it is very disrupted he wakes up frequently and usually about 330 he wakes up and just cannot go back to sleep he does usually fall asleep quickly.  Will consider switching to [facility].  I did encourage him to taper the trazodone over a couple of days before just topping completely.      Relevant Medications   eszopiclone (LUNESTA) 2 MG TABS tablet   Other Visit Diagnoses     Encounter for immunization       Relevant Orders   Flu Vaccine Trivalent High Dose (Fluad) (Completed)   Hypotension, unspecified hypotension type           Return in about 4 months (around 04/17/2023) for Diabetes follow-up.    Nani Gasser, MD

## 2022-12-16 NOTE — Assessment & Plan Note (Signed)
Follows with Podiatry for care. On gabap and amitriptyline.

## 2022-12-16 NOTE — Assessment & Plan Note (Addendum)
1C looks great today at 7.0.  Continue with empagliflozin/metformin as well Toujeo.  Follow-up in 3 to 4 months.

## 2022-12-16 NOTE — Assessment & Plan Note (Signed)
Blood pressure is low today.  I am going to have him stop the metoprolol completely and continue with losartan 25 mg if it still low we can always split the losartan in half he says he is asymptomatic he does not feel lightheaded dizzy.  He says he feels a little tired but he really thinks it is more from the lack of sleep.

## 2022-12-16 NOTE — Assessment & Plan Note (Signed)
He feels like the trazodone is no longer helpful.  He is getting about 5 hours of sleep most nights but it is very disrupted he wakes up frequently and usually about 330 he wakes up and just cannot go back to sleep he does usually fall asleep quickly.  Will consider switching to [facility].  I did encourage him to taper the trazodone over a couple of days before just topping completely.

## 2022-12-17 LAB — CMP14+EGFR
ALT: 14 IU/L (ref 0–44)
AST: 19 IU/L (ref 0–40)
Albumin: 4.5 g/dL (ref 3.9–4.9)
Alkaline Phosphatase: 78 IU/L (ref 44–121)
BUN/Creatinine Ratio: 19 (ref 10–24)
BUN: 17 mg/dL (ref 8–27)
Bilirubin Total: 0.3 mg/dL (ref 0.0–1.2)
CO2: 23 mmol/L (ref 20–29)
Calcium: 9.8 mg/dL (ref 8.6–10.2)
Chloride: 99 mmol/L (ref 96–106)
Creatinine, Ser: 0.89 mg/dL (ref 0.76–1.27)
Globulin, Total: 3.1 g/dL (ref 1.5–4.5)
Glucose: 106 mg/dL — ABNORMAL HIGH (ref 70–99)
Potassium: 4.9 mmol/L (ref 3.5–5.2)
Sodium: 139 mmol/L (ref 134–144)
Total Protein: 7.6 g/dL (ref 6.0–8.5)
eGFR: 93 mL/min/{1.73_m2} (ref >59)

## 2022-12-17 NOTE — Telephone Encounter (Signed)
Form was completed,faxed,confirmation received and scanned into pt's chart and copy also given to pt.

## 2022-12-17 NOTE — Progress Notes (Signed)
Your lab work is within acceptable range and there are no concerning findings.   ?

## 2022-12-19 NOTE — Telephone Encounter (Signed)
Yes he has been diagnosed with cardiovascular disease and diabetes.

## 2022-12-29 ENCOUNTER — Telehealth: Payer: Self-pay

## 2022-12-29 NOTE — Telephone Encounter (Signed)
Elnita Maxwell, can you reach out to this patient and see what might be going on I am not sure what blood pressure pill he is talking about all of those are generic.  I suspect the Kafocin is the one costing him the most.  I do think he would probably qualify for patient assistance and maybe if we can help him maneuver that it would be wonderful.

## 2022-12-29 NOTE — Telephone Encounter (Signed)
Patient came into office to stating that his high BP medication and Diabeties medications are both very expensive ($248) each and he can't afford that, please advise, thanks.

## 2022-12-30 ENCOUNTER — Telehealth: Payer: Self-pay

## 2022-12-30 NOTE — Progress Notes (Unsigned)
12/30/2022 Name: Justin Goodman MRN: [id number] DOB: 09-07-1955  Chief Complaint  Patient presents with   Medication Assistance   Justin Goodman is a 67 y.o. year old male who presented for a telephone visit.   They were referred to the pharmacist by their PCP for assistance in managing medication access.    Subjective:  Care Team: Primary Care Provider: Agapito Games, MD ; Next Scheduled Visit: 04/20/23  Medication Access/Adherence  Current Pharmacy:  [facility] - [place], [place] - [address] [place] [address] Phone: [phone] Fax: [phone]  [id number] [place], [place] - [place] [address] [place] [address] Phone: [phone] Fax: [phone]  -Patient reports affordability concerns with their medications: Yes -Synjardy/Invokamet and Pantoprazole -Patient reports access/transportation concerns to their pharmacy: No  -Patient reports adherence concerns with their medications:  No  Diabetes: Current medications: Toujeo 30 units daily -Medications tried in the past: Invokamet, but this is no longer affordable for the patient -[name] also recently prescribed, but this copay is not affordable either -Discussed PAP for Synjardy, but patient does not wish to apply.  He states he has a request in with his [employer] plan to assist with affordability of Synjardy and would like to switch to metformin for now  Objective: Lab Results  Component Value Date   HGBA1C 7.0 (A) 12/16/2022   Lab Results  Component Value Date   CREATININE 0.89 12/16/2022   BUN 17 12/16/2022   NA 139 12/16/2022   K 4.9 12/16/2022   CL 99 12/16/2022   CO2 23 12/16/2022   Medications Reviewed Today     Reviewed by [name], RPH (Pharmacist) on 12/30/22 at 1301  Med List Status: <None>   Medication Order Taking? Sig Documenting Provider Last Dose Status Informant  Accu-Chek FastClix Lancets MISC  [id number]  Check blood glucose 4 times daily Diagnosis diabetes E11.40 Agapito Games, MD  Active   amitriptyline (ELAVIL) 75 MG tablet [id number]  Take 1 tablet (75 mg total) by mouth at bedtime as needed for sleep. Agapito Games, MD  Active   blood glucose meter kit and supplies [id number]  Dispense based on patient and insurance preference. Use up to four times daily as directed. Dx: E11.9 Agapito Games, MD  Active   Blood Glucose Monitoring Suppl (ACCU-CHEK GUIDE) w/Device KIT [id number]  Use up to 4 times daily. Dx: F09.32 Agapito Games, MD  Active   Empagliflozin-metFORMIN HCl ER 25-1000 MG TB24 [id number] No Take 1 tablet by mouth in the morning.  Patient not taking: Reported on 09/08/2022   Agapito Games, MD Not Taking Active            Med Note Littie Deeds, [facility] A   Tue Dec 30, 2022  1:00 PM) Cannot afford  eszopiclone (LUNESTA) 2 MG TABS tablet [id number]  Take 1 tablet (2 mg total) by mouth at bedtime as needed for sleep. Take immediately before bedtime Agapito Games, MD  Active   gabapentin (NEURONTIN) 600 MG tablet [id number]  TAKE 2 TABLETS THREE TIMES DAILY. Agapito Games, MD  Active   glucose blood (ACCU-CHEK GUIDE) test strip [id number]  Use up to 4 times daily. Dx: Y69.48 Agapito Games, MD  Active   insulin glargine, 1 Unit Dial, (TOUJEO SOLOSTAR) 300 UNIT/ML Solostar Pen [id number]  INJECT 36 UNITS INTO THE SKIN DAILY.  Patient taking differently: Inject 30 Units into the skin.  INJECT 36 UNITS INTO THE SKIN DAILY.   Agapito Games, MD  Active   Insulin Pen Needle (B-D UF III MINI PEN NEEDLES) 31G X 5 MM MISC [id number]  USE FOR DAILY INSULIN INJECTION AS DIRECTED BY PHYSICIAN Dx: W09.81 Agapito Games, MD  Active   losartan (COZAAR) 25 MG tablet [id number]  Take 1 tablet (25 mg total) by mouth daily. Agapito Games, MD  Active   meloxicam [facility]) 7.5 MG tablet [id number]  Take 1 tablet (7.5 mg total) by mouth 2  (two) times daily. Agapito Games, MD  Active   pantoprazole (PROTONIX) 40 MG tablet [id number] No Take 1 tablet (40 mg total) by mouth 2 (two) times daily before a meal.  Patient not taking: Reported on 12/30/2022   Agapito Games, MD Not Taking Active            Med Note Sabino Niemann A   Tue Dec 30, 2022  1:01 PM) Cost  pravastatin (PRAVACHOL) 20 MG tablet [id number]  TAKE 1 TABLET BY MOUTH TWICE PER WEEK AT BEDTIME. Agapito Games, MD  Active   tiZANidine (ZANAFLEX) 4 MG tablet [id number]  TAKE 1 TABLET BY MOUTH TWICE A DAY AS NEEDED FOR MUSCLE SPASMS Agapito Games, MD  Active   UltiCare Alcohol Swabs 70 % PADS [id number]  CHECK BLOOD GLUCOSE 4 TIMES DAILY DIAGNOSIS DIABETES E11.40 Agapito Games, MD  Active            Assessment/Plan:   Medication Access/Adherence -Contacted patient's pharmacy, and pantoprazole 20mg  BID was picked up on 9/10 at no cost.  Verified with patient that he DOES have this medication; he was confused thinking it was the same PPI he had been on (omeprazole).  Counseled on BID use and administration.  Diabetes: - Currently moderately controlled uncontrolled - Recommend continuation of Toujeo 30 units daily  - Recommend initiating metformin XR 500mg  2 tablets BID; pending prescription for Dr. Linford Arnold to sign if in agreement  Follow Up Plan: Will contact patient to check on home BG readings in 4 weeks  [name], PharmD, DPLA

## 2022-12-31 ENCOUNTER — Telehealth: Payer: Self-pay | Admitting: Family Medicine

## 2022-12-31 MED ORDER — METFORMIN HCL ER 500 MG PO TB24: 1000.0000 mg | ORAL_TABLET | Freq: Two times a day (BID) | ORAL | 1 refills | Status: DC

## 2022-12-31 NOTE — Telephone Encounter (Signed)
Pharmacy called in stating that the patient is there stating that there is supposed to be  a rx for metformin. Seen Justin Goodman, [facility] note about the patient wanting to switch back. Please advise

## 2022-12-31 NOTE — Telephone Encounter (Signed)
Meds ordered this encounter  Medications   metFORMIN (GLUCOPHAGE-XR) 500 MG 24 hr tablet    Sig: Take 2 tablets (1,000 mg total) by mouth 2 (two) times daily with a meal.    Dispense:  360 tablet    Refill:  1

## 2023-01-01 ENCOUNTER — Telehealth: Payer: Self-pay | Admitting: Family Medicine

## 2023-01-01 NOTE — Telephone Encounter (Signed)
[facility] [place]   Phone [phone] called about Metformin GLUCOPHAGE-XR 577m 24 hr tablet it was sent to the wrong pharmacy it was sent to [facility] instead please resubmit

## 2023-01-02 ENCOUNTER — Other Ambulatory Visit: Payer: Self-pay | Admitting: *Deleted

## 2023-01-02 DIAGNOSIS — E1142 Type 2 diabetes mellitus with diabetic polyneuropathy: Secondary | ICD-10-CM

## 2023-01-02 MED ORDER — METFORMIN HCL ER 500 MG PO TB24
1000.0000 mg | ORAL_TABLET | Freq: Two times a day (BID) | ORAL | 1 refills | Status: DC
Start: 2023-01-02 — End: 2023-10-22

## 2023-01-02 NOTE — Telephone Encounter (Signed)
Medication sent to pharmacy  

## 2023-01-16 DIAGNOSIS — W01198A Fall on same level from slipping, tripping and stumbling with subsequent striking against other object, initial encounter: Secondary | ICD-10-CM | POA: Diagnosis not present

## 2023-01-16 DIAGNOSIS — S62635B Displaced fracture of distal phalanx of left ring finger, initial encounter for open fracture: Secondary | ICD-10-CM | POA: Diagnosis not present

## 2023-01-16 DIAGNOSIS — S022XXB Fracture of nasal bones, initial encounter for open fracture: Secondary | ICD-10-CM | POA: Diagnosis not present

## 2023-01-16 DIAGNOSIS — R000 Tachycardia, unspecified: Secondary | ICD-10-CM | POA: Diagnosis not present

## 2023-01-16 DIAGNOSIS — R739 Hyperglycemia, unspecified: Secondary | ICD-10-CM | POA: Diagnosis not present

## 2023-01-16 DIAGNOSIS — I10 Essential (primary) hypertension: Secondary | ICD-10-CM | POA: Diagnosis not present

## 2023-01-18 ENCOUNTER — Other Ambulatory Visit: Payer: Self-pay | Admitting: Family Medicine

## 2023-01-18 DIAGNOSIS — M48062 Spinal stenosis, lumbar region with neurogenic claudication: Secondary | ICD-10-CM

## 2023-01-20 ENCOUNTER — Ambulatory Visit (INDEPENDENT_AMBULATORY_CARE_PROVIDER_SITE_OTHER): Payer: Medicare HMO | Admitting: Family Medicine

## 2023-01-20 ENCOUNTER — Other Ambulatory Visit: Payer: Self-pay | Admitting: Sports Medicine

## 2023-01-20 ENCOUNTER — Encounter: Payer: Self-pay | Admitting: Family Medicine

## 2023-01-20 VITALS — BP 93/53 | HR 72 | Ht 66.0 in | Wt 156.0 lb

## 2023-01-20 DIAGNOSIS — I959 Hypotension, unspecified: Secondary | ICD-10-CM | POA: Diagnosis not present

## 2023-01-20 DIAGNOSIS — S62605A Fracture of unspecified phalanx of left ring finger, initial encounter for closed fracture: Secondary | ICD-10-CM

## 2023-01-20 DIAGNOSIS — S0121XA Laceration without foreign body of nose, initial encounter: Secondary | ICD-10-CM

## 2023-01-20 DIAGNOSIS — S62609A Fracture of unspecified phalanx of unspecified finger, initial encounter for closed fracture: Secondary | ICD-10-CM | POA: Insufficient documentation

## 2023-01-20 NOTE — Progress Notes (Signed)
Established Patient Office Visit  Subjective   Patient ID: Justin Goodman, male    DOB: 1955/11/19  Age: 67 y.o. MRN: [id number]  Chief Complaint  Patient presents with   Follow-up    Fall     HPI  Here for recent fall on Friday.  [place] and fell and hit his face on a rock and then fractured his fingers.  He was seen at a local hospital called [facility].  I do not have any discharge information available to [name] and unable to load through care everywhere.  They did splint his fourth and fifth fingers.  He also had a pretty large laceration across his nose and suffered a nasal fracture.  He says he is actually able to breathe through his nose without any major difficulty.  They placed 11 sutures.  He also has a laceration over his left eyebrow.  He notes that he still feeling dizzy at times.  When I last saw him about a month ago blood pressure was low and I had him stop his metoprolol he says he still taking it 2 times a week not really sure why.  He is still taking the losartan.    ROS    Objective:     BP (!) 93/53   Pulse 72   Ht 5\' 6"  (1.676 m)   Wt 156 lb (70.8 kg)   SpO2 100%   BMI 25.18 kg/m    Physical Exam Vitals and nursing note reviewed.  Constitutional:      Appearance: Normal appearance.  HENT:     Head: Normocephalic and atraumatic.  Eyes:     Conjunctiva/sclera: Conjunctivae normal.  Cardiovascular:     Rate and Rhythm: Normal rate and regular rhythm.     Heart sounds: Murmur heard.  Pulmonary:     Effort: Pulmonary effort is normal.     Breath sounds: Normal breath sounds.  Skin:    General: Skin is warm and dry.  Neurological:     Mental Status: He is alert.  Psychiatric:        Mood and Affect: Mood normal.   I did unwrap his hand to take a look at his fingers seem to have some dried blood but overall the color looks good he does have some swelling particularly over that fourth digit. He has  a pretty wide laceration across his nasal bridge with social sutures in place.  And a laceration over the left eyebrow.   No results found for any visits on 01/20/23.    The 10-year ASCVD risk score (Arnett DK, et al., 2019) is: 25.5%    Assessment & Plan:   Problem List Items Addressed This Visit       Musculoskeletal and Integument   Fracture of finger, left   Other Visit Diagnoses     Laceration of nose, initial encounter    -  Primary   Hypotension, unspecified hypotension type           I do think the low blood pressure could be contributing to the dizziness.  Blood pressure was still low here today so wrote down for him again to please discontinue his metoprolol completely and I am going to have him split his losartan in half.  Continue to make sure staying well-hydrated.  Your fractures-he definitely knows that the fourth finger was fractured but I am not sure about the fifth as well.  Again I do not  have any records but going to go ahead and get him in with either sports med or Ortho this week they are probably get a repeat some imaging.  His friend who was with him today said that they did have to straighten the finger back out.  So I do not know if it was dislocated or fractured is not clear.  Color of the fingertips looks good.   Recommend return for suture removal in 7 to 10 days.  Okay to get wet in the shower.  We discussed not applying any type of alcohol, peroxide etc. just let the soap and water hit it and pat dry do not scrub at the area.  Has a fracture of the nose as well but since he is breathing normally and I do not see any sign of cellulitis organ to hold off on ENT referral at this time.  Will check it again when he comes back in for suture removal.  Return in about 6 days (around 01/26/2023) for suture removal .    Nani Gasser, MD

## 2023-01-20 NOTE — Patient Instructions (Signed)
Please stop your metoprolol.  Please cut your losartan in half and only take 1/2 each day.

## 2023-01-20 NOTE — Progress Notes (Signed)
Fx left 4th and potentially 5th fingers 01/16/2023.  Adding XR.

## 2023-01-21 ENCOUNTER — Ambulatory Visit: Payer: Medicare HMO | Admitting: Sports Medicine

## 2023-01-21 ENCOUNTER — Ambulatory Visit: Payer: Medicare HMO

## 2023-01-21 DIAGNOSIS — S62605A Fracture of unspecified phalanx of left ring finger, initial encounter for closed fracture: Secondary | ICD-10-CM

## 2023-01-26 ENCOUNTER — Ambulatory Visit (INDEPENDENT_AMBULATORY_CARE_PROVIDER_SITE_OTHER): Payer: Medicare HMO | Admitting: Family Medicine

## 2023-01-26 ENCOUNTER — Encounter: Payer: Self-pay | Admitting: Family Medicine

## 2023-01-26 VITALS — BP 120/63 | HR 104 | Ht 66.0 in | Wt 162.0 lb

## 2023-01-26 DIAGNOSIS — I959 Hypotension, unspecified: Secondary | ICD-10-CM

## 2023-01-26 DIAGNOSIS — S0121XA Laceration without foreign body of nose, initial encounter: Secondary | ICD-10-CM

## 2023-01-26 DIAGNOSIS — I10 Essential (primary) hypertension: Secondary | ICD-10-CM | POA: Diagnosis not present

## 2023-01-26 MED ORDER — LOSARTAN POTASSIUM 25 MG PO TABS
12.5000 mg | ORAL_TABLET | Freq: Every day | ORAL | 0 refills | Status: DC
Start: 2023-01-26 — End: 2023-04-15

## 2023-01-26 NOTE — Progress Notes (Signed)
   Established Patient Office Visit  Subjective   Patient ID: Justin Goodman, male    DOB: [birth year]  Age: 67 y.o. MRN: [id number]  Chief Complaint  Patient presents with   Suture / Staple Removal         HPI  Here for suture removal from his nasal bridge where he suffered a laceration.  Please see prior note.  He says he is doing well.  They are little uncomfortable he is more concerned about the pain in his left hand but he has a follow-up with sports medicine tomorrow for fracture.  He did hold his metoprolol and is cutting the losartan in half.  He said he has not dizzy.  But he said he also did not feel dizzy when he fell.  He was walking down a very steep hill and started to lose his balance and then started running down the hill and then tripped and fell forward and hit his head.  ROS    Objective:     BP 120/63   Pulse (!) 104   Ht 5\' 6"  (1.676 m)   Wt 162 lb (73.5 kg)   SpO2 100%   BMI 26.15 kg/m     Physical Exam Skin:    Comments: Laceration across nasal bridge is healing well there is a fair amount of scabbing present.  The sutures were removed as per below.  Tolerated procedure well.      No results found for any visits on 01/26/23.     The 10-year ASCVD risk score (Arnett DK, et al., 2019) is: 37.3%    Assessment & Plan:   Problem List Items Addressed This Visit       Cardiovascular and Mediastinum   Essential hypertension, benign   Relevant Medications   losartan (COZAAR) 25 MG tablet   Other Visit Diagnoses     Laceration of nose, initial encounter    -  Primary   Hypotension, unspecified hypotension type       Relevant Medications   losartan (COZAAR) 25 MG tablet      Hypotension -blood pressure looks much today. Better today.  Systolic was in the 90s when he was here last week and today it is 120/63.  Subjective:    Justin Goodman is a 67 y.o. male who obtained a laceration  11  days ago, which required closure with  11   sutures. Mechanism of injury: fall. He denies pain, redness, or drainage from the wound. His last tetanus was in 2017  The following portions of the patient's history were reviewed and updated as appropriate: allergies and current medications.     Objective:    BP 120/63   Pulse (!) 104   Ht 5\' 6"  (1.676 m)   Wt 162 lb (73.5 kg)   SpO2 100%   BMI 26.15 kg/m  Injury exam:  A 2.5 cm laceration noted on the nasal bridge is healing well, without evidence of infection.    Assessment:    Laceration is healing well, with evidence of infection.    Plan:     1.  11  sutures were removed. 2. Wound care discussed. 3. Follow up as needed.   Return if symptoms worsen or fail to improve.    Nani Gasser, MD

## 2023-01-27 ENCOUNTER — Ambulatory Visit: Payer: Medicare HMO

## 2023-01-27 ENCOUNTER — Ambulatory Visit (INDEPENDENT_AMBULATORY_CARE_PROVIDER_SITE_OTHER): Payer: Medicare HMO | Admitting: Sports Medicine

## 2023-01-27 ENCOUNTER — Encounter: Payer: Self-pay | Admitting: Sports Medicine

## 2023-01-27 DIAGNOSIS — M19042 Primary osteoarthritis, left hand: Secondary | ICD-10-CM | POA: Diagnosis not present

## 2023-01-27 DIAGNOSIS — S62615A Displaced fracture of proximal phalanx of left ring finger, initial encounter for closed fracture: Secondary | ICD-10-CM | POA: Diagnosis not present

## 2023-01-27 DIAGNOSIS — S62605D Fracture of unspecified phalanx of left ring finger, subsequent encounter for fracture with routine healing: Secondary | ICD-10-CM

## 2023-01-27 DIAGNOSIS — S62633A Displaced fracture of distal phalanx of left middle finger, initial encounter for closed fracture: Secondary | ICD-10-CM | POA: Diagnosis not present

## 2023-01-27 DIAGNOSIS — M1812 Unilateral primary osteoarthritis of first carpometacarpal joint, left hand: Secondary | ICD-10-CM | POA: Diagnosis not present

## 2023-01-27 NOTE — Assessment & Plan Note (Addendum)
2 weeks status post fourth proximal phalangeal fracture with comminution, well aligned. He is neurovascularly intact today. Getting updated x-rays and I have placed a new ulnar gutter splint. Patient understands this will take approximately 8 weeks to heal. Follow-up will depend greatly on if the fracture fragments continue to appear to be stable. Tentatively we will set 4 weeks. He will double up on his hydrocodone to two 5/325mg  tablets 2-3 times daily. I can refill this if he needs.  Update: X-rays personally reviewed, again noted is the comminuted fourth proximal phalangeal fracture, it does appear well aligned and I do see bony callus on the x-rays, there is also an additionally identified third distal phalangeal fracture, well aligned, we will keep an eye on this and ensure at the follow-up visit that he has good extension at the DIP of the third digit.

## 2023-01-27 NOTE — Progress Notes (Addendum)
    Procedures performed today:    Ulnar gutter splint placed today.  Independent interpretation of notes and tests performed by another provider:   X-rays personally reviewed and do show comminution of the fourth proximal phalangeal fracture without significant displacement or angulation, there is bony callus.  In addition there is a comminuted fracture, nondisplaced and nonangulated third distal phalanx.  Brief History, Exam, Impression, and Recommendations:    Comminuted fourth proximal phalangeal fracture, comminuted third distal phalangeal fracture, left 2 weeks status post fourth proximal phalangeal fracture with comminution, well aligned. He is neurovascularly intact today. Getting updated x-rays and I have placed a new ulnar gutter splint. Patient understands this will take approximately 8 weeks to heal. Follow-up will depend greatly on if the fracture fragments continue to appear to be stable. Tentatively we will set 4 weeks. He will double up on his hydrocodone to two 5/325mg  tablets 2-3 times daily. I can refill this if he needs.  Update: X-rays personally reviewed, again noted is the comminuted fourth proximal phalangeal fracture, it does appear well aligned and I do see bony callus on the x-rays, there is also an additionally identified third distal phalangeal fracture, well aligned, we will keep an eye on this and ensure at the follow-up visit that he has good extension at the DIP of the third digit.    ____________________________________________ Ihor Austin. Benjamin Stain, M.D., ABFM., CAQSM., AME. Primary Care and Sports Medicine [facility]  Adjunct Professor of Family Medicine  [employer]  Restaurant manager, fast food

## 2023-01-30 ENCOUNTER — Ambulatory Visit: Payer: Medicare HMO | Admitting: Physician Assistant

## 2023-02-13 ENCOUNTER — Ambulatory Visit (INDEPENDENT_AMBULATORY_CARE_PROVIDER_SITE_OTHER): Payer: Medicare HMO | Admitting: Family Medicine

## 2023-02-13 ENCOUNTER — Encounter: Payer: Self-pay | Admitting: Family Medicine

## 2023-02-13 VITALS — BP 109/85 | HR 81 | Resp 20 | Ht 66.0 in | Wt 161.0 lb

## 2023-02-13 DIAGNOSIS — S0121XA Laceration without foreign body of nose, initial encounter: Secondary | ICD-10-CM | POA: Diagnosis not present

## 2023-02-13 DIAGNOSIS — T50905A Adverse effect of unspecified drugs, medicaments and biological substances, initial encounter: Secondary | ICD-10-CM

## 2023-02-13 NOTE — Progress Notes (Signed)
   Established Patient Office Visit  Subjective   Patient ID: Justin Goodman, male    DOB: 1956/01/22  Age: 67 y.o. MRN: [id number]  Chief Complaint  Patient presents with   LEFT HAND INJURY   Suture / Staple Removal    HPI  Here to follow-up on the wound across his nasal bridge.  He says that it feels better it is not as tender and painful.  He still has a lot of bruising particularly on his left facial cheek.    ROS    Objective:     BP 109/85 (BP Location: Right Arm, Cuff Size: Normal)   Pulse 81   Resp 20   Ht 5\' 6"  (1.676 m)   Wt 161 lb 0.6 oz (73 kg)   SpO2 97%   BMI 25.99 kg/m    Physical Exam Vitals and nursing note reviewed.  Constitutional:      Appearance: Normal appearance.  HENT:     Head: Normocephalic and atraumatic.  Eyes:     Conjunctiva/sclera: Conjunctivae normal.  Cardiovascular:     Rate and Rhythm: Normal rate and regular rhythm.  Pulmonary:     Effort: Pulmonary effort is normal.     Breath sounds: Normal breath sounds.  Skin:    General: Skin is warm and dry.  Neurological:     Mental Status: He is alert.  Psychiatric:        Mood and Affect: Mood normal.      No results found for any visits on 02/13/23.    The 10-year ASCVD risk score (Arnett DK, et al., 2019) is: 32.4%    Assessment & Plan:   Problem List Items Addressed This Visit   None Visit Diagnoses     Medication side effect, initial encounter    -  Primary   Laceration of nose, initial encounter          Last seen is healing well he still has a fair amount of swelling over over the left nasal bridge and a lot of bruising over the left maxillary cheek.  He still had 2 small sutures in place so I quickly removed those today.  He tolerated it well.  Continue to apply Vaseline 1-2 times daily over the next couple of weeks.  He is gena follow-up for his left hand fracture next week with Dr. Benjamin Stain our sports med doc.  He also wanted to let me know that he  quit taking the amitriptyline 75 mg he felt like it was really interrupting his sleep and causing him to wake up almost every hour and then feel exhausted the next day since stopping it and it is completely resolved and he is doing great.  He is still taking his trazodone.  Will add medication to intolerance list.   Return in about 2 months (around 04/15/2023) for Diabetes follow-up.    Nani Gasser, MD

## 2023-02-17 ENCOUNTER — Ambulatory Visit (INDEPENDENT_AMBULATORY_CARE_PROVIDER_SITE_OTHER): Payer: Medicare HMO | Admitting: Sports Medicine

## 2023-02-17 ENCOUNTER — Encounter: Payer: Self-pay | Admitting: Sports Medicine

## 2023-02-17 ENCOUNTER — Ambulatory Visit: Payer: Medicare HMO

## 2023-02-17 DIAGNOSIS — S62605D Fracture of unspecified phalanx of left ring finger, subsequent encounter for fracture with routine healing: Secondary | ICD-10-CM

## 2023-02-17 DIAGNOSIS — S62633A Displaced fracture of distal phalanx of left middle finger, initial encounter for closed fracture: Secondary | ICD-10-CM | POA: Diagnosis not present

## 2023-02-17 DIAGNOSIS — S62619A Displaced fracture of proximal phalanx of unspecified finger, initial encounter for closed fracture: Secondary | ICD-10-CM | POA: Diagnosis not present

## 2023-02-17 DIAGNOSIS — S62635A Displaced fracture of distal phalanx of left ring finger, initial encounter for closed fracture: Secondary | ICD-10-CM | POA: Diagnosis not present

## 2023-02-17 MED ORDER — HYDROCODONE-ACETAMINOPHEN 10-325 MG PO TABS: 1.0000 | ORAL_TABLET | Freq: Three times a day (TID) | ORAL | 0 refills | Status: DC | PRN

## 2023-02-17 NOTE — Assessment & Plan Note (Addendum)
Shmiel returns, he is approximately 5 to 6 weeks status post a fall, he ended up with fourth proximal phalangeal fracture with comminution but good alignment, he also was noted to have a third distal phalangeal fracture. There was bony callus around his fourth proximal phalangeal fracture. He has been placed in a ulnar gutter splint. He still has significant swelling, he does have tenderness at the fourth proximal phalanx. He also appears to have some ulnar deviation at the PIP fourth digit. The biggest issue however is we are unable to get extension at the fourth proximal interphalangeal joint raising suspicion for a extensor tendon rupture. Because of this we will proceed with MRI of the left hand As he does have improved pain over the fractures we will discontinue the ulnar gutter splint and I have buddy taped his third and fourth digits together. Today he is doing okay, he still has a good amount of pain, we will refill his hydrocodone 10/325. I would like updated x-rays today due to concern for progressive deformity. Follow-up will depend on MRI results. If tendon is intact we will refer to physical therapy, if tendon appears ruptured he will need hand surgery consultation.

## 2023-02-17 NOTE — Progress Notes (Signed)
    Procedures performed today:    None.  Independent interpretation of notes and tests performed by another provider:   None.  Brief History, Exam, Impression, and Recommendations:    Comminuted fourth proximal phalangeal fracture, comminuted third distal phalangeal fracture, left Justin Goodman returns, he is approximately 5 to 6 weeks status post a fall, he ended up with fourth proximal phalangeal fracture with comminution but good alignment, he also was noted to have a third distal phalangeal fracture. There was bony callus around his fourth proximal phalangeal fracture. He has been placed in a ulnar gutter splint. He still has significant swelling, he does have tenderness at the fourth proximal phalanx. He also appears to have some ulnar deviation at the PIP fourth digit. The biggest issue however is we are unable to get extension at the fourth proximal interphalangeal joint raising suspicion for a extensor tendon rupture. Because of this we will proceed with MRI of the left hand As he does have improved pain over the fractures we will discontinue the ulnar gutter splint and I have buddy taped his third and fourth digits together. Today he is doing okay, he still has a good amount of pain, we will refill his hydrocodone 10/325. I would like updated x-rays today due to concern for progressive deformity. Follow-up will depend on MRI results. If tendon is intact we will refer to physical therapy, if tendon appears ruptured he will need hand surgery consultation.    ____________________________________________ Ihor Austin. Benjamin Stain, M.D., ABFM., CAQSM., AME. Primary Care and Sports Medicine [facility]  Adjunct Professor of Family Medicine  [employer]  Restaurant manager, fast food

## 2023-02-18 ENCOUNTER — Telehealth: Payer: Self-pay | Admitting: Sports Medicine

## 2023-02-18 NOTE — Telephone Encounter (Signed)
Patient states left hand is swollen please advise Phone number [phone]

## 2023-02-19 NOTE — Telephone Encounter (Signed)
It was swollen in the office visit as well, as it more swollen now?  He should elevate it for now, MRI should be scheduled ASAP.  X-rays overall look okay and do not show any additional displacement.

## 2023-02-27 ENCOUNTER — Telehealth: Payer: Self-pay

## 2023-02-27 NOTE — Telephone Encounter (Signed)
Task completed. Patient was informed of the following information regarding the authorization of the MRI study:  Auth obtained, ref # [id number]. Valid from 02/25/23 through 05/26/23. The imaging dept has been notified.  Case # [id number].   Patient has confirmed to keep his appt scheduled on 02/28/23.

## 2023-02-27 NOTE — Telephone Encounter (Signed)
Copied from CRM [id number]. Topic: Clinical - Medical Advice >> Feb 27, 2023  3:48 PM Alona Bene A wrote: Reason for CRM: Patient stated that he needed to cancel his appointment for MRI scheduled for tomorrow 02/28/23 due to insurance not covering MRI. Patient stated that he would like to speak with imaging for further assistance as he is not paying OOP for MRI. Please contact patient at [phone].

## 2023-02-28 ENCOUNTER — Other Ambulatory Visit: Payer: Medicare HMO

## 2023-02-28 DIAGNOSIS — S62635A Displaced fracture of distal phalanx of left ring finger, initial encounter for closed fracture: Secondary | ICD-10-CM | POA: Diagnosis not present

## 2023-02-28 DIAGNOSIS — S62615A Displaced fracture of proximal phalanx of left ring finger, initial encounter for closed fracture: Secondary | ICD-10-CM | POA: Diagnosis not present

## 2023-02-28 DIAGNOSIS — S62611A Displaced fracture of proximal phalanx of left index finger, initial encounter for closed fracture: Secondary | ICD-10-CM | POA: Diagnosis not present

## 2023-02-28 DIAGNOSIS — M7989 Other specified soft tissue disorders: Secondary | ICD-10-CM | POA: Diagnosis not present

## 2023-02-28 DIAGNOSIS — S62605D Fracture of unspecified phalanx of left ring finger, subsequent encounter for fracture with routine healing: Secondary | ICD-10-CM

## 2023-03-04 ENCOUNTER — Encounter: Payer: Self-pay | Admitting: Sports Medicine

## 2023-03-04 ENCOUNTER — Ambulatory Visit (INDEPENDENT_AMBULATORY_CARE_PROVIDER_SITE_OTHER): Payer: Medicare HMO | Admitting: Sports Medicine

## 2023-03-04 DIAGNOSIS — S62605D Fracture of unspecified phalanx of left ring finger, subsequent encounter for fracture with routine healing: Secondary | ICD-10-CM

## 2023-03-04 NOTE — Progress Notes (Addendum)
    Procedures performed today:    None.  Independent interpretation of notes and tests performed by another provider:   None.  Brief History, Exam, Impression, and Recommendations:    Comminuted fourth proximal phalangeal fracture, comminuted third distal phalangeal fracture, [name] returns, he is a 67 year old male, he is now approximately 6 to 7 weeks status post a fall, ended up with a fourth proximal phalangeal fracture with comminution but good alignment, it was also noted that he had a third distal phalangeal fracture, he had bony callus around the fourth proximal phalangeal fracture, it was minimally angulated. He has done a lot better in an ulnar gutter splint. At the last visit however we did note difficulty with extension at the fourth PIP raising suspicion for an extensor tendon rupture. We obtained an MRI but unfortunately the official results are not back yet, I will call them to get urgent results. He no longer has pain over the fourth proximal phalanx suggesting there is some healing, I have buddy taped his third and fourth fingers together and I would like him to do some hand occupational therapy. Hand surgery consultation will depend on MRI results.  Update: There is concern for disruption of the lateral attachments of the extensor digitorum to the fourth digit, combined with lack of ability to extend at the PIP I would like consultation with hand surgery.  He will however continue with occupational therapy and splinting.    ____________________________________________ Justin Goodman. Benjamin Stain, M.D., ABFM., CAQSM., AME. Primary Care and Sports Medicine [facility]  Adjunct Professor of Family Medicine  [place] [employer] [facility] [employer] [facility]  Restaurant manager, fast food

## 2023-03-04 NOTE — Assessment & Plan Note (Addendum)
Justin Goodman returns, he is a 67 year old male, he is now approximately 6 to 7 weeks status post a fall, ended up with a fourth proximal phalangeal fracture with comminution but good alignment, it was also noted that he had a third distal phalangeal fracture, he had bony callus around the fourth proximal phalangeal fracture, it was minimally angulated. He has done a lot better in an ulnar gutter splint. At the last visit however we did note difficulty with extension at the fourth PIP raising suspicion for an extensor tendon rupture. We obtained an MRI but unfortunately the official results are not back yet, I will call them to get urgent results. He no longer has pain over the fourth proximal phalanx suggesting there is some healing, I have buddy taped his third and fourth fingers together and I would like him to do some hand occupational therapy. Hand surgery consultation will depend on MRI results.  Update: There is concern for disruption of the lateral attachments of the extensor digitorum to the fourth digit, combined with lack of ability to extend at the PIP I would like consultation with hand surgery.  He will however continue with occupational therapy and splinting.

## 2023-03-04 NOTE — Addendum Note (Signed)
Addended by: Monica Becton on: 03/04/2023 01:00 PM   Modules accepted: Orders

## 2023-03-08 ENCOUNTER — Other Ambulatory Visit: Payer: Self-pay | Admitting: Family Medicine

## 2023-03-09 ENCOUNTER — Telehealth: Payer: Self-pay | Admitting: Family Medicine

## 2023-03-09 NOTE — Telephone Encounter (Signed)
Copied from CRM [id number]. Topic: Referral - Question >> Mar 09, 2023  1:50 PM Alvino Blood C wrote: Reason for CRM: Call stated referral for OT can't be used because they only complete Physical therapy services.

## 2023-03-12 NOTE — Telephone Encounter (Signed)
Referral, clinical notes, demographics and copies of insurance cards have been faxed to [facility]  at [phone]. Office will contact patient to schedule referral appointment.   Received Incoming Phone message stating [facility] only offers Physical Therapy Services at this time.

## 2023-03-18 ENCOUNTER — Telehealth: Payer: Self-pay

## 2023-03-18 NOTE — Telephone Encounter (Signed)
Referral orders were placed 03/04/2023 for both occupational therapy for the hand in [place] as well as hand surgery consultation.

## 2023-03-18 NOTE — Telephone Encounter (Unsigned)
Copied from CRM [id number]. Topic: Clinical - Lab/Test Results >> Mar 13, 2023 10:05 AM Joanette Gula wrote: Reason for CRM: Pt is waiting to receive a call back from the nurse regarding Therapy for his left hand. Please call  [phone].

## 2023-03-19 NOTE — Telephone Encounter (Signed)
03/19/23-Left message on patients voicemail regarding upcoming appointments; 03/23/23 at 9:00 am with [facility] 04/09/2023 at 9:20 am with Jackelyn Hoehn, OT[facility] and to contact [facility] at [phone] to reschedule missed appointment.

## 2023-03-19 NOTE — Telephone Encounter (Signed)
Patient has an appointment scheduled 03/23/23 at 9:00am with [facility](Spoke with [facility]). Contacted [facility] ( spoke with Lynden Ang), patient had an appointment on 03/16/23 at 9:15am with Shon Baton that he no showed. Patient also has OT appointment on 04/09/23 at 9:20 am with Jackelyn Hoehn, OT[facility]. Patient can contact [facility] at [phone] to reschedule missed consult appointment.

## 2023-03-23 DIAGNOSIS — M79642 Pain in left hand: Secondary | ICD-10-CM | POA: Diagnosis not present

## 2023-03-24 ENCOUNTER — Other Ambulatory Visit: Payer: Self-pay

## 2023-03-24 DIAGNOSIS — S62605D Fracture of unspecified phalanx of left ring finger, subsequent encounter for fracture with routine healing: Secondary | ICD-10-CM

## 2023-03-24 NOTE — Telephone Encounter (Signed)
Copied from CRM [id number]. Topic: Clinical - Medication Refill >> Mar 24, 2023  1:54 PM Georgeanna Harrison H wrote: Most Recent Primary Care Visit:  Provider: Monica Becton  Department: [facility]  Visit Type: OFFICE VISIT  Date: 03/04/2023  Medication: HYDROcodone-acetaminophen (NORCO) 10-325 MG tablet  Has the patient contacted their pharmacy? Yes (Agent: If no, request that the patient contact the pharmacy for the refill. If patient does not wish to contact the pharmacy document the reason why and proceed with request.) (Agent: If yes, when and what did the pharmacy advise?)  Is this the correct pharmacy for this prescription? Yes If no, delete pharmacy and type the correct one.  This is the patient's preferred pharmacy:  [facility] - [place], [place] - [address] [place] [address] Phone: [phone] Fax: [phone]    Has the prescription been filled recently? Yes  Is the patient out of the medication? Yes  Has the patient been seen for an appointment in the last year OR does the patient have an upcoming appointment? Yes  Can we respond through MyChart? Yes  Agent: Please be advised that Rx refills may take up to 3 business days. We ask that you follow-up with your pharmacy.

## 2023-03-24 NOTE — Telephone Encounter (Signed)
Requesting rx rf of hydrocodone - acet  Last written 02/17/2023 Last OV 02/13/2023 Upcoming appt 04/20/2023

## 2023-03-25 ENCOUNTER — Ambulatory Visit: Payer: Medicare HMO | Admitting: Sports Medicine

## 2023-03-25 MED ORDER — HYDROCODONE-ACETAMINOPHEN 10-325 MG PO TABS: 1.0000 | ORAL_TABLET | Freq: Three times a day (TID) | ORAL | 0 refills | Status: DC | PRN

## 2023-03-26 ENCOUNTER — Telehealth: Payer: Self-pay | Admitting: Family Medicine

## 2023-03-26 NOTE — Telephone Encounter (Signed)
Copied from CRM [id number]. Topic: General - Other >> Mar 25, 2023  2:50 PM Whitney O wrote: Reason for CRM: patient returning call wanting to speak with tosha [place] . Called CAL line and got her number and reaching out to her no one answered. Left a message on voice mail to give patient a call [phone]

## 2023-03-26 NOTE — Telephone Encounter (Signed)
Patient states he can open his hand now and he wants to know if medication was sent for pain supplement. Patient was unable to provide name of medication. Please advise.

## 2023-03-26 NOTE — Telephone Encounter (Signed)
03/26/23-Returned patients call back. Patient states he is not going to Ortho provider. Patient states he does not want to travel outside of [place]. Patient was told that [facility] does have an office in [place] but patient still declined referral. Referral has been closed.

## 2023-04-02 ENCOUNTER — Encounter: Payer: Self-pay | Admitting: Medical-Surgical

## 2023-04-02 ENCOUNTER — Ambulatory Visit (INDEPENDENT_AMBULATORY_CARE_PROVIDER_SITE_OTHER): Payer: Medicare HMO | Admitting: Medical-Surgical

## 2023-04-02 ENCOUNTER — Other Ambulatory Visit: Payer: Self-pay | Admitting: Medical-Surgical

## 2023-04-02 VITALS — BP 134/58 | HR 107 | Resp 20 | Ht 66.0 in | Wt 160.1 lb

## 2023-04-02 DIAGNOSIS — K5900 Constipation, unspecified: Secondary | ICD-10-CM | POA: Diagnosis not present

## 2023-04-02 DIAGNOSIS — K644 Residual hemorrhoidal skin tags: Secondary | ICD-10-CM | POA: Diagnosis not present

## 2023-04-02 MED ORDER — HYDROCORTISONE (PERIANAL) 2.5 % EX CREA: 1.0000 | TOPICAL_CREAM | Freq: Two times a day (BID) | CUTANEOUS | 0 refills | Status: DC

## 2023-04-02 MED ORDER — POLYETHYLENE GLYCOL 3350 17 G PO PACK: 17.0000 g | PACK | Freq: Two times a day (BID) | ORAL | 11 refills | Status: AC

## 2023-04-02 NOTE — Patient Instructions (Signed)
Use Anusol cream to the anal area twice daily to treat the painful hemorrhoid.   Start Mirilax 1 packet twice daily until having regular bowel movements that are easy to pass. Mix 1 packet in 8 ounces of fluid and allow to dissolve entirely before drinking it. If diarrhea develops, stop the medication.

## 2023-04-02 NOTE — Progress Notes (Signed)
        Established patient visit  History, exam, impression, and plan:  1. Inflamed external hemorrhoid (Primary) 2. Constipation, unspecified constipation type Pleasant 67 year old male presenting today with complaints of 2 weeks of rectal pain and difficulty having bowel movements.  Reports that the pain is at the anal entrance, right worse than left.  Endorses straining with bowel movements.  Has tried multiple over-the-counter medications without benefit.  Denies nausea, vomiting, hematochezia, and melena.  On exam today, there is a small amount of light brown feces in the perirectal area with mild irritation to the skin.  He has a large external hemorrhoid that appears to be inflamed and irritated which is likely the cause of most of his pain.  He has significant tenderness to touch at the site of the hemorrhoid.  Discussed recommendations for management of hemorrhoids.  Sending in Anusol cream to use twice daily as needed.  Addressing constipation with MiraLAX 17 g twice daily until stooling regularly.  Would recommend reducing MiraLAX to once daily as needed thereafter to prevent constipation.  Would recommend close follow-up in 2 weeks with his PCP for reevaluation.   Procedures performed this visit: None.  Return in about 2 weeks (around 04/16/2023) for constipation/hemorrhoid follow up with PCP.  __________________________________ Thayer Ohm, DNP, APRN, FNP-BC Primary Care and Sports Medicine [facility] [place]

## 2023-04-07 ENCOUNTER — Encounter: Payer: Self-pay | Admitting: Sports Medicine

## 2023-04-07 ENCOUNTER — Ambulatory Visit (INDEPENDENT_AMBULATORY_CARE_PROVIDER_SITE_OTHER): Payer: Medicare HMO | Admitting: Sports Medicine

## 2023-04-07 DIAGNOSIS — S62605D Fracture of unspecified phalanx of left ring finger, subsequent encounter for fracture with routine healing: Secondary | ICD-10-CM

## 2023-04-07 NOTE — Progress Notes (Signed)
    Procedures performed today:    None.  Independent interpretation of notes and tests performed by another provider:   None.  Brief History, Exam, Impression, and Recommendations:    Comminuted fourth proximal phalangeal fracture, comminuted third distal phalangeal fracture, left Justin Goodman returns, he is a 67 year old male, he is now approximately 10 to 11 weeks status post a fall with a fourth proximal phalangeal fracture with comminution, minimal angulation. He also had a third distal phalangeal fracture. He was initially treated in an ulnar gutter splint, has since come out of the splint, and he was noted to have significant difficulty with extension of the fourth PIP, I did suspect extensor tendon disruption. Ultimately we obtained an MRI that does additionally show concern for disruption of the lateral attachments of the extensor digitorum commonness tendon to the fourth digit. We asked for consultation with hand surgery and I ordered occupational therapy. He did see a PA with [facility] who suggested that there was no surgical option, and also suggested occupational therapy. He has not started any therapy. I think it is reasonable for him to do some occupational therapy for approximately 4 to 6 weeks but if insufficient improvement to extension strength at the fourth PIP he will absolutely need a second opinion from a different hand surgery practice.    ____________________________________________ Debby PARAS. Curtis, M.D., ABFM., CAQSM., AME. Primary Care and Sports Medicine [facility]  Adjunct Professor of [facility] Medicine  [employer]  [employer], [employer]

## 2023-04-07 NOTE — Patient Instructions (Addendum)
 For Occupational Therapy we have already sent the referral so you need to call [facility] at [phone].

## 2023-04-07 NOTE — Assessment & Plan Note (Signed)
 Justin Goodman returns, he is a 67 year old male, he is now approximately 10 to 11 weeks status post a fall with a fourth proximal phalangeal fracture with comminution, minimal angulation. He also had a third distal phalangeal fracture. He was initially treated in an ulnar gutter splint, has since come out of the splint, and he was noted to have significant difficulty with extension of the fourth PIP, I did suspect extensor tendon disruption. Ultimately we obtained an MRI that does additionally show concern for disruption of the lateral attachments of the extensor digitorum commonness tendon to the fourth digit. We asked for consultation with hand surgery and I ordered occupational therapy. He did see a PA with [facility] who suggested that there was no surgical option, and also suggested occupational therapy. He has not started any therapy. I think it is reasonable for him to do some occupational therapy for approximately 4 to 6 weeks but if insufficient improvement to extension strength at the fourth PIP he will absolutely need a second opinion from a different hand surgery practice.

## 2023-04-10 ENCOUNTER — Other Ambulatory Visit: Payer: Self-pay | Admitting: *Deleted

## 2023-04-10 ENCOUNTER — Telehealth: Payer: Self-pay

## 2023-04-10 DIAGNOSIS — E1142 Type 2 diabetes mellitus with diabetic polyneuropathy: Secondary | ICD-10-CM

## 2023-04-10 MED ORDER — TOUJEO SOLOSTAR 300 UNIT/ML ~~LOC~~ SOPN: PEN_INJECTOR | SUBCUTANEOUS | 3 refills | Status: DC

## 2023-04-10 NOTE — Telephone Encounter (Signed)
 Copied from CRM [id number]. Topic: Clinical - Prescription Issue >> Apr 10, 2023  2:56 PM Susanna ORN wrote: Reason for CRM: Patient called upset that he has not gotten the five prescriptions that he requested [facility] [employer]. The meds are: blood glucose meter kit & supplies, hydrocodone -acetaminophen  (NORCO) 10-325 MG tablet, hydrocortisone  (ANUSOL -HC) 2.5 % rectal cream, pantoprazole  (PROTONIX ) 40 MG tablet, & polyethylene glycol (MIRALAX  / GLYCOLAX ) 17 g packet. Please give pt a call back to advise why the refills has not been filled. CB #: [id number].

## 2023-04-13 ENCOUNTER — Other Ambulatory Visit: Payer: Self-pay

## 2023-04-13 NOTE — Telephone Encounter (Signed)
 Med refill sent on 04/10/23  Copied from CRM #[id number]. Topic: Clinical - Medication Refill >> Apr 10, 2023  2:52 PM Susanna ORN wrote: Most Recent Primary Care Visit:  Provider: CURTIS DEBBY PARAS  Department: [facility]  Visit Type: ACUTE  Date: 04/07/2023  Medication: insulin  glargine, 1 Unit Dial, (TOUJEO  SOLOSTAR) 300 UNIT/ML Solostar Pen  Has the patient contacted their pharmacy? Yes (Agent: If no, request that the patient contact the pharmacy for the refill. If patient does not wish to contact the pharmacy document the reason why and proceed with request.) (Agent: If yes, when and what did the pharmacy advise?)  Is this the correct pharmacy for this prescription? Yes If no, delete pharmacy and type the correct one.  This is the patient's preferred pharmacy:   [id number] [facility] [place], [place] - [address] [place] [address] Phone: [phone] Fax: [phone]   Has the prescription been filled recently? Yes  Is the patient out of the medication? Yes  Has the patient been seen for an appointment in the last year OR does the patient have an upcoming appointment? Yes  Can we respond through MyChart? No  Agent: Please be advised that Rx refills may take up to 3 business days. We ask that you follow-up with your pharmacy.

## 2023-04-15 ENCOUNTER — Ambulatory Visit: Payer: Medicare HMO | Admitting: Family Medicine

## 2023-04-15 ENCOUNTER — Telehealth: Payer: Self-pay | Admitting: Family Medicine

## 2023-04-15 ENCOUNTER — Encounter: Payer: Self-pay | Admitting: Family Medicine

## 2023-04-15 VITALS — BP 129/62 | HR 76 | Ht 66.0 in | Wt 156.0 lb

## 2023-04-15 DIAGNOSIS — I10 Essential (primary) hypertension: Secondary | ICD-10-CM

## 2023-04-15 DIAGNOSIS — E1142 Type 2 diabetes mellitus with diabetic polyneuropathy: Secondary | ICD-10-CM | POA: Diagnosis not present

## 2023-04-15 DIAGNOSIS — I700 Atherosclerosis of aorta: Secondary | ICD-10-CM | POA: Diagnosis not present

## 2023-04-15 DIAGNOSIS — S62605D Fracture of unspecified phalanx of left ring finger, subsequent encounter for fracture with routine healing: Secondary | ICD-10-CM

## 2023-04-15 DIAGNOSIS — E785 Hyperlipidemia, unspecified: Secondary | ICD-10-CM | POA: Diagnosis not present

## 2023-04-15 DIAGNOSIS — M48062 Spinal stenosis, lumbar region with neurogenic claudication: Secondary | ICD-10-CM | POA: Diagnosis not present

## 2023-04-15 DIAGNOSIS — E1165 Type 2 diabetes mellitus with hyperglycemia: Secondary | ICD-10-CM | POA: Diagnosis not present

## 2023-04-15 DIAGNOSIS — Z7984 Long term (current) use of oral hypoglycemic drugs: Secondary | ICD-10-CM

## 2023-04-15 LAB — POCT GLYCOSYLATED HEMOGLOBIN (HGB A1C): Hemoglobin A1C: 8.6 % — AB (ref 4.0–5.6)

## 2023-04-15 MED ORDER — PRAVASTATIN SODIUM 20 MG PO TABS: ORAL_TABLET | ORAL | 3 refills | Status: DC

## 2023-04-15 MED ORDER — TIZANIDINE HCL 4 MG PO TABS: ORAL_TABLET | ORAL | 0 refills | Status: DC

## 2023-04-15 MED ORDER — PANTOPRAZOLE SODIUM 40 MG PO TBEC: DELAYED_RELEASE_TABLET | ORAL | 0 refills | Status: DC

## 2023-04-15 NOTE — Assessment & Plan Note (Signed)
 Will refill statin coming up for renewal next months we will go ahead and send to mail order.

## 2023-04-15 NOTE — Assessment & Plan Note (Addendum)
 1C slightly elevated at 8.6 today which is up from 7.0.  With his recent fracture injury he has been less active.  He is still on metformin  and 30 units of Toujeo .  This morning fasting blood sugar was 176.  I really like to put him on Farxiga  or Jardiance  to see if we can get maybe close to a point of A1c reduction would like to at least get him back under 8.  He does tend to have occasional episodes of hypoglycemia with his insulin  some hesitant to up his insulin  dose right now.  Will wait unfortunately he will need some patient assistance to be able afford these medications which are coming up around the $500 price mark for 90-day supply.  Will reach out to our clinical pharmacist to see if he might qualify for PAP.  Minded him again to please schedule his eye appointment he had a bad experience at [facility] previously so has been hesitant to schedule he says he is gena find a new eye doctor.  Reminded him that the exam is recommended yearly to evaluate for retinopathy especially with history of diabetes and especially poorly controlled diabetes.

## 2023-04-15 NOTE — Progress Notes (Signed)
 Established Patient Office Visit  Subjective  Patient ID: Justin Goodman, male    DOB: 1956/04/01  Age: 68 y.o. MRN: [id number]  No chief complaint on file.   HPI  Week follow-up for constipation-he was also noted to have an external hemorrhoid that was inflamed and was given hydrocortisone  ointment and encouraged to use MiraLAX  until bowels were returned to normal. Says  the topical steroid didn't help so switched to preparation H.  He says he is feeling much better he is taking bisacodyl instead of the MiraLAX  3 times a day and having a bowel movement every other day but says it is soft and he is not having to strain and he is feeling a lot better.  Diabetes - no hypoglycemic events. No wounds or sores that are not healing well. No increased thirst or urination. Checking glucose at home. Taking medications as prescribed without any side effects.  F/U insomnia - he is no longer taking the Lunesta .    He also brought in a paper today where they are requiring a formal referral to [facility] for visit date of December 16 for hand injury.     ROS    Objective:     BP 129/62   Pulse 76   Ht 5' 6 (1.676 m)   Wt 156 lb (70.8 kg)   SpO2 100%   BMI 25.18 kg/m    Physical Exam Vitals and nursing note reviewed.  Constitutional:      Appearance: Normal appearance.  HENT:     Head: Normocephalic and atraumatic.  Eyes:     Conjunctiva/sclera: Conjunctivae normal.  Cardiovascular:     Rate and Rhythm: Normal rate and regular rhythm.  Pulmonary:     Effort: Pulmonary effort is normal.     Breath sounds: Normal breath sounds.  Skin:    General: Skin is warm and dry.  Neurological:     Mental Status: He is alert.  Psychiatric:        Mood and Affect: Mood normal.     Results for orders placed or performed in visit on 04/15/23  POCT HgB A1C  Result Value Ref Range   Hemoglobin A1C 8.6 (A) 4.0 - 5.6 %   HbA1c POC (<> result, manual entry)     HbA1c, POC  (prediabetic range)     HbA1c, POC (controlled diabetic range)        The 10-year ASCVD risk score (Arnett DK, et al., 2019) is: 36.4%    Assessment & Plan:   Problem List Items Addressed This Visit       Cardiovascular and Mediastinum   Essential hypertension, benign   Relevant Medications   pravastatin  (PRAVACHOL ) 20 MG tablet   Aortic atherosclerosis (HCC)   Continue pravastatin .       Relevant Medications   pravastatin  (PRAVACHOL ) 20 MG tablet     Endocrine   Uncontrolled diabetes mellitus with hyperglycemia (HCC) - Primary   1C slightly elevated at 8.6 today which is up from 7.0.  With his recent fracture injury he has been less active.  He is still on metformin  and 30 units of Toujeo .  This morning fasting blood sugar was 176.  I really like to put him on Farxiga  or Jardiance  to see if we can get maybe close to a point of A1c reduction would like to at least get him back under 8.  He does tend to have occasional episodes of hypoglycemia with his insulin  some hesitant to up his insulin   dose right now.  Will wait unfortunately he will need some patient assistance to be able afford these medications which are coming up around the $500 price mark for 90-day supply.  Will reach out to our clinical pharmacist to see if he might qualify for PAP.  Minded him again to please schedule his eye appointment he had a bad experience at [facility] previously so has been hesitant to schedule he says he is gena find a new eye doctor.  Reminded him that the exam is recommended yearly to evaluate for retinopathy especially with history of diabetes and especially poorly controlled diabetes.      Relevant Medications   pravastatin  (PRAVACHOL ) 20 MG tablet   Other Relevant Orders   POCT HgB A1C (Completed)   Diabetic peripheral neuropathy (HCC)   Relevant Medications   pravastatin  (PRAVACHOL ) 20 MG tablet   tiZANidine  (ZANAFLEX ) 4 MG tablet     Musculoskeletal and Integument   Comminuted fourth  proximal phalangeal fracture, comminuted third distal phalangeal fracture, left   Relevant Orders   AMB referral to orthopedics     Other   Lumbar stenosis with neurogenic claudication   Refill muscle relaxer.      Relevant Medications   tiZANidine  (ZANAFLEX ) 4 MG tablet   Hyperlipidemia   Will refill statin coming up for renewal next months we will go ahead and send to mail order.      Relevant Medications   pravastatin  (PRAVACHOL ) 20 MG tablet   Other Visit Diagnoses       Well controlled type 2 diabetes mellitus with peripheral neuropathy (HCC)       Relevant Medications   pravastatin  (PRAVACHOL ) 20 MG tablet   tiZANidine  (ZANAFLEX ) 4 MG tablet       Return in about 3 months (around 07/21/2023) for Diabetes follow-up.    Dorothyann Byars, MD

## 2023-04-15 NOTE — Telephone Encounter (Signed)
 That would be wonderful.  Thank you so much for your help.

## 2023-04-15 NOTE — Assessment & Plan Note (Signed)
 Continue pravastatin

## 2023-04-15 NOTE — Assessment & Plan Note (Signed)
Refill muscle relaxer  

## 2023-04-15 NOTE — Telephone Encounter (Signed)
 Hi Cheryl, can you assist with diabetic medications for him.  His A1c is uncontrolled at 8.6 which is up from 7.0 last time in part because [place] so he has not been as active but at the same time would really like to make an adjustment to his medication look to get him on either Farxiga  or Jardiance .  But according to our calculator it is going to cost him $500 for 90-day supply which I know he is not going to be able to afford.  He is already thin and has issues with constipation and he continues to smoke some a little hesitant to put him on a GLP-1, but if you think we might able to get that covered better we could go in that direction as well.  I just know what ever we choose besides his metformin  and insulin  were going to have to probably get some form of patient assistance for him.

## 2023-04-17 ENCOUNTER — Other Ambulatory Visit: Payer: Self-pay

## 2023-04-17 NOTE — Telephone Encounter (Signed)
 This has been addressed.

## 2023-04-17 NOTE — Progress Notes (Signed)
   04/17/2023  Patient ID: Justin Goodman, male   DOB: 1955-04-17, 68 y.o.   MRN: [id number]  Patient outreach to assist with affordability of SGLT2 in response to clinic routed request from patient's PCP, Dr. Alvan.  Provider would like to initiate either Farxiga  or Jaridance, but neither of these would be affordable based on test claim information.  Based on [facility], patient would qualify for AZ&Me PAP for Farxiga .  Patient is coming in 1/16 to see me at [facility] to complete PAP application.  Contacting Dr. Alvan to inform and verify dose she would like to prescribe.  Justin Goodman, PharmD, DPLA

## 2023-04-20 ENCOUNTER — Ambulatory Visit: Payer: Medicare HMO | Admitting: Family Medicine

## 2023-04-21 NOTE — Progress Notes (Signed)
   04/21/2023  Patient ID: Justin Goodman, male   DOB: 10/13/1955, 68 y.o.   MRN: [id number]  Patient outreach to see if Justin Goodman agreeable with me pre-filling AZ&Me PAP application for Farxiga  5mg  daily and mailing to him to complete and sign.  Patient is agreeable, so patient and provide portions have been prefilled.  Mailing patient portion to his home along with pre-addressed and stamped envelope to mail into AZ&Me and faxing provider portion for Dr. Alvan to complete, sign/date, and fax into AZ&Me.  I will contact patient in 1 week to verify receipt and see if he has any questions.  [name], PharmD, DPLA

## 2023-04-23 ENCOUNTER — Other Ambulatory Visit: Payer: Self-pay

## 2023-04-24 ENCOUNTER — Other Ambulatory Visit: Payer: Self-pay | Admitting: Family Medicine

## 2023-04-24 DIAGNOSIS — M48062 Spinal stenosis, lumbar region with neurogenic claudication: Secondary | ICD-10-CM

## 2023-04-27 ENCOUNTER — Telehealth: Payer: Self-pay

## 2023-04-27 NOTE — Telephone Encounter (Signed)
Patient came into office to drop off DMV forms to be complete, forms placed in Dr. Shelah Lewandowsky box, thanks.

## 2023-04-28 DIAGNOSIS — M79642 Pain in left hand: Secondary | ICD-10-CM | POA: Diagnosis not present

## 2023-04-28 DIAGNOSIS — G5602 Carpal tunnel syndrome, left upper limb: Secondary | ICD-10-CM | POA: Diagnosis not present

## 2023-04-28 DIAGNOSIS — M65352 Trigger finger, left little finger: Secondary | ICD-10-CM | POA: Diagnosis not present

## 2023-04-29 ENCOUNTER — Encounter: Payer: Self-pay | Admitting: Family Medicine

## 2023-04-29 DIAGNOSIS — E11319 Type 2 diabetes mellitus with unspecified diabetic retinopathy without macular edema: Secondary | ICD-10-CM | POA: Insufficient documentation

## 2023-04-29 NOTE — Telephone Encounter (Signed)
Spoke with patient. Informed that needing ophthalmology to complete required portion. He states he has an appt tomorrow with Dr. Linford Arnold He will pick up form tomorrow and can better explain what is needed with him at that time.

## 2023-04-29 NOTE — Telephone Encounter (Signed)
My portion of arms completed.  He is going to have to have his eye doctor fill out page 3.  He filled it out himself but he cannot do that it has to be by the eye doctor.  I believe he sees Dr. Tiburcio Pea but not sure if he has been in the last year if he has he may just be able to call their office and see if they could drop it off and fill it out for him.  Charge sheet attached and placed in Kim's basket.

## 2023-04-30 ENCOUNTER — Encounter: Payer: Self-pay | Admitting: Family Medicine

## 2023-04-30 ENCOUNTER — Ambulatory Visit (INDEPENDENT_AMBULATORY_CARE_PROVIDER_SITE_OTHER): Payer: Medicare HMO | Admitting: Family Medicine

## 2023-04-30 VITALS — BP 130/56 | HR 89 | Temp 97.7°F | Ht 66.0 in | Wt 163.0 lb

## 2023-04-30 DIAGNOSIS — J22 Unspecified acute lower respiratory infection: Secondary | ICD-10-CM

## 2023-04-30 DIAGNOSIS — J441 Chronic obstructive pulmonary disease with (acute) exacerbation: Secondary | ICD-10-CM | POA: Diagnosis not present

## 2023-04-30 MED ORDER — ALBUTEROL SULFATE HFA 108 (90 BASE) MCG/ACT IN AERS: 2.0000 | INHALATION_SPRAY | Freq: Four times a day (QID) | RESPIRATORY_TRACT | 0 refills | Status: AC | PRN

## 2023-04-30 MED ORDER — AZITHROMYCIN 250 MG PO TABS: ORAL_TABLET | ORAL | 0 refills | Status: AC

## 2023-04-30 MED ORDER — HYDROCODONE BIT-HOMATROP MBR 5-1.5 MG/5ML PO SOLN: 5.0000 mL | Freq: Three times a day (TID) | ORAL | 0 refills | Status: DC | PRN

## 2023-04-30 NOTE — Progress Notes (Signed)
Acute Office Visit  Subjective:     Patient ID: Justin Goodman, male    DOB: [birth year], 68 y.o.   MRN: [id number]  Chief Complaint  Patient presents with   Cough   Nasal Congestion    HPI Patient is in today for cough with productive yellow sputum for about 3 weeks.  No sore throat.  No ear pain.  Some mild congestion and runny nose.  No fevers or chills.  Actually has surgery scheduled for Monday.  He is noticed wheezing.  He says he is using inhaler from [facility] but he has and says it helps some.  ROS      Objective:    BP (!) 130/56   Pulse 89   Temp 97.7 F (36.5 C) (Oral)   Ht 5\' 6"  (1.676 m)   Wt 163 lb (73.9 kg)   SpO2 100%   BMI 26.31 kg/m    Physical Exam Vitals and nursing note reviewed.  Constitutional:      Appearance: Normal appearance.  HENT:     Head: Normocephalic and atraumatic.     Right Ear: Tympanic membrane, ear canal and external ear normal. There is no impacted cerumen.     Left Ear: Tympanic membrane, ear canal and external ear normal. There is no impacted cerumen.     Nose: Nose normal.     Mouth/Throat:     Pharynx: Oropharynx is clear.  Eyes:     Conjunctiva/sclera: Conjunctivae normal.  Cardiovascular:     Rate and Rhythm: Normal rate and regular rhythm.  Pulmonary:     Effort: Pulmonary effort is normal.     Breath sounds: Wheezing and rhonchi present.     Comments: Fuhs wheezing and rhonchi worse on the right compared to the left.  No crackles. Musculoskeletal:     Cervical back: Neck supple. No tenderness.  Lymphadenopathy:     Cervical: No cervical adenopathy.  Skin:    General: Skin is warm and dry.  Neurological:     Mental Status: He is alert and oriented to person, place, and time.  Psychiatric:        Mood and Affect: Mood normal.     No results found for any visits on 04/30/23.      Assessment & Plan:   Problem List Items Addressed This Visit   None Visit Diagnoses       Lower respiratory infection     -  Primary   Relevant Medications   azithromycin (ZITHROMAX) 250 MG tablet   HYDROcodone bit-homatropine (HYCODAN) 5-1.5 MG/5ML syrup     COPD exacerbation (HCC)       Relevant Medications   azithromycin (ZITHROMAX) 250 MG tablet   HYDROcodone bit-homatropine (HYCODAN) 5-1.5 MG/5ML syrup   albuterol (VENTOLIN HFA) 108 (90 Base) MCG/ACT inhaler      Lower respiratory infection with COPD exacerbation will treat with azithromycin and albuterol.  He requested cough medicine for bedtime.  I am going to hold off on giving him prednisone currently because he is supposed to have orthopedic surgery on Monday.  Meds ordered this encounter  Medications   azithromycin (ZITHROMAX) 250 MG tablet    Sig: 2 Ttabs PO on Day 1, then one a day x 4 days.    Dispense:  6 tablet    Refill:  0   HYDROcodone bit-homatropine (HYCODAN) 5-1.5 MG/5ML syrup    Sig: Take 5 mLs by mouth every 8 (eight) hours as needed for cough.    Dispense:  60 mL    Refill:  0   albuterol (VENTOLIN HFA) 108 (90 Base) MCG/ACT inhaler    Sig: Inhale 2 puffs into the lungs every 6 (six) hours as needed for wheezing or shortness of breath.    Dispense:  18 g    Refill:  0    No follow-ups on file.  Nani Gasser, MD

## 2023-05-01 NOTE — Telephone Encounter (Signed)
Forms given to front office for pt to pay for before being released. Pt was advised of this at OV

## 2023-05-04 ENCOUNTER — Telehealth: Payer: Self-pay

## 2023-05-04 NOTE — Progress Notes (Signed)
   05/04/2023  Patient ID: Justin Goodman, male   DOB: November 07, 1955, 67 y.o.   MRN: [id number]  Patient out reach attempt to verify that [name] received the patient assistance application for [facility] mailed to him recently.  I was not able to reach the patient, but a HIPAA compliant voicemail was left with my direct number for him to return my call.  If I do not hear from him I will try to call again in a few days.  [name], PharmD, DPLA

## 2023-05-11 ENCOUNTER — Other Ambulatory Visit: Payer: Self-pay

## 2023-05-11 NOTE — Progress Notes (Signed)
   05/11/2023  Patient ID: Justin Goodman, male   DOB: July 10, 1955, 68 y.o.   MRN: [id number]  Second outreach attempt to verify patient received Farxiga PAP application mailed out to him a couple of weeks ago.  I was not able to reach the patient, so I did leave a HIPAA compliant voicemail with my direct phone number for him to return my call.  I will try to reach the patient again next week if I do not hear back from him.  [name], PharmD, DPLA

## 2023-05-11 NOTE — Progress Notes (Signed)
   05/11/2023  Patient ID: Justin Goodman, male   DOB: [birth year], 68 y.o.   MRN: [id number]  Patient returned my call and stated he did receive and complete the [facility] patient assistance application.  He also mailed this in to primary care [place] approximately 2 weeks ago.  Collaborating with office staff to see if this happens to be in the box outside of my office.  If so, I will see if they can fax that into AZ&Me.  [name], PharmD, DPLA

## 2023-05-12 ENCOUNTER — Telehealth: Payer: Self-pay | Admitting: Family Medicine

## 2023-05-12 NOTE — Telephone Encounter (Signed)
 Copied from CRM [id number]. Topic: Referral - Question >> May 08, 2023  9:12 AM Justin Goodman wrote: Reason for CRM: patient calling in to state that PCP must send referral to [facility], as his visit was over $600. Attempted to get more information out of patient regarding what the issue may be but patient states is has happened before but will provide no further information. Please advise.

## 2023-05-14 ENCOUNTER — Telehealth: Payer: Self-pay

## 2023-05-14 NOTE — Telephone Encounter (Signed)
 Deanna Channing LABOR, [facility]  P P[facility]-Primary [facility] Good afternoon,  Would somebody be able to see if there is an envelope addressed to me with this patient's Farxiga  PAP application in it in the box outside of my door?  If so, could you fax it into AZ&Me at the number on the application?  Sorry to bother you all with this, and I will finally be back in person again next Thursday!  Channing LABOR Deanna, PharmD, DPLA

## 2023-05-14 NOTE — Telephone Encounter (Signed)
 I did find the PAP application. However,  he did not fill out the income information.

## 2023-05-18 ENCOUNTER — Other Ambulatory Visit: Payer: Self-pay

## 2023-05-19 ENCOUNTER — Telehealth: Payer: Self-pay

## 2023-05-19 ENCOUNTER — Ambulatory Visit: Payer: Medicare HMO | Admitting: Sports Medicine

## 2023-05-19 NOTE — Progress Notes (Signed)
   05/19/2023  Patient ID: Justin Goodman, male   DOB: [birth year], 68 y.o.   MRN: [id number]  Patient portion of AZ&Me PAP application for Farxiga 5 mg has been returned to [facility], but patient did not include household income information.  I contacted [name] to get this information and have provided this to clinical staff to request that they fill missing information and and fax into AZ&Me.  Provider portion has already been received, so I will contact AZ&Me at the end of this week to check on processing status and inform patient.  [name], PharmD, DPLA

## 2023-05-20 ENCOUNTER — Other Ambulatory Visit: Payer: Self-pay | Admitting: Family Medicine

## 2023-05-20 DIAGNOSIS — E1142 Type 2 diabetes mellitus with diabetic polyneuropathy: Secondary | ICD-10-CM

## 2023-05-22 NOTE — Telephone Encounter (Signed)
Form faxed

## 2023-05-25 DIAGNOSIS — E119 Type 2 diabetes mellitus without complications: Secondary | ICD-10-CM | POA: Diagnosis not present

## 2023-05-25 DIAGNOSIS — S62615P Displaced fracture of proximal phalanx of left ring finger, subsequent encounter for fracture with malunion: Secondary | ICD-10-CM | POA: Diagnosis not present

## 2023-05-25 DIAGNOSIS — I10 Essential (primary) hypertension: Secondary | ICD-10-CM | POA: Diagnosis not present

## 2023-05-25 DIAGNOSIS — M65342 Trigger finger, left ring finger: Secondary | ICD-10-CM | POA: Diagnosis not present

## 2023-05-25 DIAGNOSIS — I2510 Atherosclerotic heart disease of native coronary artery without angina pectoris: Secondary | ICD-10-CM | POA: Diagnosis not present

## 2023-05-25 DIAGNOSIS — S62613P Displaced fracture of proximal phalanx of left middle finger, subsequent encounter for fracture with malunion: Secondary | ICD-10-CM | POA: Diagnosis not present

## 2023-05-25 DIAGNOSIS — M79642 Pain in left hand: Secondary | ICD-10-CM | POA: Diagnosis not present

## 2023-05-25 DIAGNOSIS — E1169 Type 2 diabetes mellitus with other specified complication: Secondary | ICD-10-CM | POA: Diagnosis not present

## 2023-05-25 DIAGNOSIS — M65352 Trigger finger, left little finger: Secondary | ICD-10-CM | POA: Diagnosis not present

## 2023-05-25 DIAGNOSIS — G8918 Other acute postprocedural pain: Secondary | ICD-10-CM | POA: Diagnosis not present

## 2023-05-25 DIAGNOSIS — K219 Gastro-esophageal reflux disease without esophagitis: Secondary | ICD-10-CM | POA: Diagnosis not present

## 2023-05-25 DIAGNOSIS — M65332 Trigger finger, left middle finger: Secondary | ICD-10-CM | POA: Diagnosis not present

## 2023-05-25 DIAGNOSIS — Z888 Allergy status to other drugs, medicaments and biological substances status: Secondary | ICD-10-CM | POA: Diagnosis not present

## 2023-05-25 DIAGNOSIS — G629 Polyneuropathy, unspecified: Secondary | ICD-10-CM | POA: Diagnosis not present

## 2023-05-27 ENCOUNTER — Telehealth: Payer: Self-pay

## 2023-05-27 NOTE — Progress Notes (Signed)
   05/27/2023  Patient ID: Justin Goodman, male   DOB: 1955/12/22, 68 y.o.   MRN: [id number]  Contacted AZ&Me patient assistance program, and patient has been approved to receive Farxiga 5 mg daily.  Program states medication should be delivered to his home by the end of next week.  Contacted the patient to make him aware.  I will also check in with him again in another 4 to 6 weeks to see how he is tolerating this medication and check-in on overall blood glucose control.  [name], PharmD, DPLA

## 2023-06-03 ENCOUNTER — Telehealth: Payer: Self-pay

## 2023-06-03 NOTE — Telephone Encounter (Signed)
 I have no idea what he is talking about.  He already had surgery so I am not sure what he is asking for?

## 2023-06-03 NOTE — Telephone Encounter (Signed)
 What would you like to send to patient surgeon?

## 2023-06-03 NOTE — Telephone Encounter (Signed)
 Copied from CRM [id number]. Topic: General - Other >> Jun 02, 2023  9:05 AM Payton Doughty wrote: Reason for CRM: pt states he would like Dr Linford Arnold to send to his surgery dr.  Rock Nephew states she knows what that is and what he is talking about.

## 2023-06-04 DIAGNOSIS — M79642 Pain in left hand: Secondary | ICD-10-CM | POA: Diagnosis not present

## 2023-06-08 DIAGNOSIS — M25642 Stiffness of left hand, not elsewhere classified: Secondary | ICD-10-CM | POA: Diagnosis not present

## 2023-06-08 DIAGNOSIS — M79645 Pain in left finger(s): Secondary | ICD-10-CM | POA: Diagnosis not present

## 2023-06-08 NOTE — Telephone Encounter (Signed)
 Called patient and was having difficulty with understanding what he was trying to relay.  I called insurance co and surgeon's office but nothing needed from [name] that they are aware of regarding recent surgery.  I am not sure how to proceed?

## 2023-06-15 ENCOUNTER — Telehealth: Payer: Self-pay

## 2023-06-15 DIAGNOSIS — M79645 Pain in left finger(s): Secondary | ICD-10-CM | POA: Diagnosis not present

## 2023-06-15 DIAGNOSIS — M25642 Stiffness of left hand, not elsewhere classified: Secondary | ICD-10-CM | POA: Diagnosis not present

## 2023-06-15 NOTE — Progress Notes (Unsigned)
   06/15/2023  Patient ID: Justin Goodman, male   DOB: 1956/01/25, 68 y.o.   MRN: [id number]  Incoming call from patient stating that for the last 4 days his Toujeo insulin pen is not properly releasing medication, so he has gone without his long-acting insulin for 4 days.  Patient is requesting a different type of insulin based on frustrations with administration of Toujeo.  Contacted patient's insurance, and they will also cover Tresiba or Lantus; but they will not cover fill again until 3/18 based on patient's last refill date for Toujeo.  Attempted to call patient to discuss further to verify he has tried different pen needles to rule these as well as the problem, or see if he has a another pen on hand of Toujeo that he can try to administer.  I was not able to reach him, but a HIPAA compliant voicemail was left with my direct phone number to return my call.  [name], PharmD, DPLA

## 2023-06-16 NOTE — Progress Notes (Addendum)
   06/16/2023  Patient ID: Justin Goodman, male   DOB: [birth year], 68 y.o.   MRN: [id number]  Patient returning my call- he endorses trying 2 different insulin pens as well as multiple pen needles with no success.  Coming to see me in person Thursday at [facility] to see if we can get pens on hand to work to avoid additional missed medication doses.  [name], PharmD, DPLA

## 2023-06-18 ENCOUNTER — Other Ambulatory Visit: Payer: Self-pay

## 2023-06-18 NOTE — Progress Notes (Signed)
   06/18/2023  Patient ID: Justin Goodman, male   DOB: Aug 27, 1955, 68 y.o.   MRN: [id number]  Patient did not present to in person visit today to see if I could assist with Toujeo pens that are not injecting medication.  States he has tried 2 different pens and multiple pen needs with no success, and has been out of insulin for a few days now.  Patient would like to change long-acting insulins because of difficulty with Toujeo pens.  Insurance will cover Evaristo Bury but not until 3/18 based on last fill of Toujeo.  Consulting PCP to see if she is in agreement with switching basal insulin from Toujeo to [place].  Will also see if office had sample of any long-acting insulin patient could get until insurance will cover a refill.  [name], PharmD, DPLA

## 2023-06-21 MED ORDER — TRESIBA FLEXTOUCH 200 UNIT/ML ~~LOC~~ SOPN: 36.0000 [IU] | PEN_INJECTOR | SUBCUTANEOUS | 3 refills | Status: DC

## 2023-06-21 NOTE — Progress Notes (Signed)
 Meds ordered this encounter  Medications   insulin degludec (TRESIBA FLEXTOUCH) 200 UNIT/ML FlexTouch Pen    Sig: Inject 36 Units into the skin daily. This replaces Toujeo.    Dispense:  18 mL    Refill:  3

## 2023-06-24 ENCOUNTER — Telehealth: Payer: Self-pay

## 2023-06-24 ENCOUNTER — Other Ambulatory Visit: Payer: Self-pay

## 2023-06-24 NOTE — Progress Notes (Signed)
   06/24/2023  Patient ID: Delia Chimes, male   DOB: Dec 16, 1955, 68 y.o.   MRN: [id number]  Subjective/Objective: Returning missed call/voicemail from Mr. Monk regarding change of long-acting insulin  Diabetes Management Plan -Current medications:  Tresiba 36 units, daily, metformin xr 1000mg  BID, Farxiga 5mg  daily -Patient currently enrolled in [place] PAP for Farxiga 5mg  daily -Inquiring about eligibility for PAP enrollment to assist with affordability of [place].  Patient recently switched to [place] from [place] due to difficulty with last 3 Toujeo pens received from his pharmacy.  States Evaristo Bury is working well; based based on [facility], would like resources to assist with affordability. -A1c 04/15/23 was 8.6%  Assessment/Plan:  Diabetes Management Plan -Based on [facility], patient would qualify for Novo PAP for Tresiba and pen needles- submitted application online -Sees PCP again 4/8 and will be due for A1c- if >7%, I recommend increasing Farxiga to 10mg  if patient has continued to tolerate well  Follow-up:  1 week to check on PAP status, inform patient, and check on tolerance/efficacy of Farxiga 5mg   [name], PharmD, DPLA

## 2023-06-29 DIAGNOSIS — M79645 Pain in left finger(s): Secondary | ICD-10-CM | POA: Diagnosis not present

## 2023-06-29 DIAGNOSIS — Z1211 Encounter for screening for malignant neoplasm of colon: Secondary | ICD-10-CM | POA: Diagnosis not present

## 2023-06-29 DIAGNOSIS — M25642 Stiffness of left hand, not elsewhere classified: Secondary | ICD-10-CM | POA: Diagnosis not present

## 2023-06-30 ENCOUNTER — Other Ambulatory Visit: Payer: Self-pay | Admitting: Family Medicine

## 2023-06-30 DIAGNOSIS — I10 Essential (primary) hypertension: Secondary | ICD-10-CM

## 2023-06-30 DIAGNOSIS — E785 Hyperlipidemia, unspecified: Secondary | ICD-10-CM

## 2023-06-30 DIAGNOSIS — E1142 Type 2 diabetes mellitus with diabetic polyneuropathy: Secondary | ICD-10-CM

## 2023-07-01 ENCOUNTER — Other Ambulatory Visit: Payer: Self-pay

## 2023-07-01 ENCOUNTER — Other Ambulatory Visit: Payer: Self-pay | Admitting: *Deleted

## 2023-07-01 ENCOUNTER — Other Ambulatory Visit: Payer: Self-pay | Admitting: Family Medicine

## 2023-07-01 DIAGNOSIS — M48061 Spinal stenosis, lumbar region without neurogenic claudication: Secondary | ICD-10-CM

## 2023-07-01 DIAGNOSIS — F5101 Primary insomnia: Secondary | ICD-10-CM

## 2023-07-01 NOTE — Progress Notes (Signed)
   07/01/2023  Patient ID: Delia Chimes, male   DOB: 1955-07-06, 68 y.o.   MRN: [id number]  Subjective/Objective: Telephone visit to follow-up on management of diabetes  Diabetes Management Plan -Current medications:  Farxiga 5mg  daily, Tresiba 36 units daily -Patient did not tolerate sulfonylureas in the past  -Patient is prescribed metformin XR 1000mg  BID but states he stopped taking once he rec'd [place] from [employer] PAP -Endorses FBG's have been elevated, around 170 -Patient recently approved for Novo PAP for Tresiba and pen needles-informed him these should arrive at [facility] mid April- he has enough medication to last until then -Last A1c 8.6%  Assessment/Plan:  Diabetes Management Plan -Patient sees PCP again 4/8 and is due for A1c- if >7%, I recommend increasing Farxiga to 10mg  daily and may also need to consider: resuming metformin, increasing Tresiba, or initiating GLP1 (wold qualify for Novo PAP for Ozempic or Rybelsus-Rybelsus may be best based on weight and constipation)  Follow-up:  Will schedule appropriately based on upcoming PCP visit  [name], PharmD, DPLA

## 2023-07-02 ENCOUNTER — Telehealth: Payer: Self-pay | Admitting: Family Medicine

## 2023-07-02 DIAGNOSIS — F5101 Primary insomnia: Secondary | ICD-10-CM

## 2023-07-02 MED ORDER — TRAZODONE HCL 150 MG PO TABS: ORAL_TABLET | ORAL | 1 refills | Status: DC

## 2023-07-02 NOTE — Telephone Encounter (Signed)
-----   Message from [name] sent at 07/01/2023  6:02 PM EDT ----- Dr. Linford Arnold,  I had a follow-up visit with Mr. Milligan today, and he sees you again soon.  See note for full details, but my recommendations are below:  -Patient sees PCP again 4/8 and is due for A1c- if >7%, I recommend increasing Farxiga to 10mg  daily and may also need to consider: resuming metformin, increasing Tresiba, or initiating GLP1 (wold qualify for Novo PAP for Ozempic or Rybelsus-Rybelsus may be best based on weight and constipation)  See you all tomorrow!  [name], PharmD, DPLA

## 2023-07-02 NOTE — Telephone Encounter (Signed)
 Task completed. Patient mentioned that he did stop taking the metformin rx. I have informed the patient, per the provider's note that he should be taking the metformin, Farxiga and the insulin medications daily. Patient has confirmed that he will restart the metformin. He has confirmed to keep his upcoming appt with the provider.

## 2023-07-02 NOTE — Telephone Encounter (Signed)
 Please call pt to clarifymed. He does need to take the metformin and the Farxiga and the insulin to get his A1C down. Then when I see him in  acouple of weeks we can decide if we need to increase the farxiga from 5 to 10mg .

## 2023-07-02 NOTE — Telephone Encounter (Signed)
 Copied from CRM [id number]. Topic: Clinical - Prescription Issue >> Jul 02, 2023 12:21 PM Gildardo Pounds wrote: Reason for CRM: [facility] said patient states that he is still taking traZODone (DESYREL) 100 MG tablet and wants [facility]. [facility] is [phone]

## 2023-07-02 NOTE — Telephone Encounter (Signed)
 Med sent.

## 2023-07-04 LAB — COLOGUARD: COLOGUARD: NEGATIVE

## 2023-07-06 ENCOUNTER — Encounter: Payer: Self-pay | Admitting: Family Medicine

## 2023-07-06 NOTE — Progress Notes (Signed)
 Great news! Your Cologuard test is negative.  Recommend repeat colon cancer screening in 3 years.

## 2023-07-07 ENCOUNTER — Other Ambulatory Visit: Payer: Self-pay | Admitting: Family Medicine

## 2023-07-07 DIAGNOSIS — I10 Essential (primary) hypertension: Secondary | ICD-10-CM

## 2023-07-13 DIAGNOSIS — M79645 Pain in left finger(s): Secondary | ICD-10-CM | POA: Diagnosis not present

## 2023-07-13 DIAGNOSIS — M25642 Stiffness of left hand, not elsewhere classified: Secondary | ICD-10-CM | POA: Diagnosis not present

## 2023-07-14 ENCOUNTER — Encounter: Payer: Self-pay | Admitting: Family Medicine

## 2023-07-14 ENCOUNTER — Ambulatory Visit (INDEPENDENT_AMBULATORY_CARE_PROVIDER_SITE_OTHER): Payer: Medicare HMO | Admitting: Family Medicine

## 2023-07-14 VITALS — BP 119/53 | HR 64 | Ht 66.0 in | Wt 153.0 lb

## 2023-07-14 DIAGNOSIS — E1165 Type 2 diabetes mellitus with hyperglycemia: Secondary | ICD-10-CM | POA: Diagnosis not present

## 2023-07-14 DIAGNOSIS — I10 Essential (primary) hypertension: Secondary | ICD-10-CM | POA: Diagnosis not present

## 2023-07-14 DIAGNOSIS — Z7984 Long term (current) use of oral hypoglycemic drugs: Secondary | ICD-10-CM

## 2023-07-14 DIAGNOSIS — R413 Other amnesia: Secondary | ICD-10-CM | POA: Diagnosis not present

## 2023-07-14 DIAGNOSIS — F03A0 Unspecified dementia, mild, without behavioral disturbance, psychotic disturbance, mood disturbance, and anxiety: Secondary | ICD-10-CM | POA: Insufficient documentation

## 2023-07-14 LAB — POCT GLYCOSYLATED HEMOGLOBIN (HGB A1C): Hemoglobin A1C: 7.4 % — AB (ref 4.0–5.6)

## 2023-07-14 MED ORDER — METOPROLOL SUCCINATE ER 25 MG PO TB24: 25.0000 mg | ORAL_TABLET | Freq: Every day | ORAL | 1 refills | Status: DC

## 2023-07-14 MED ORDER — LOSARTAN POTASSIUM 25 MG PO TABS: 25.0000 mg | ORAL_TABLET | Freq: Every day | ORAL | 3 refills | Status: DC

## 2023-07-14 NOTE — Assessment & Plan Note (Signed)
 A1c today looks great at 7.4 he is really done fantastic he is now on Toujeo 30 units and tolerating well with any without any hypoglycemia.

## 2023-07-14 NOTE — Progress Notes (Signed)
 Established Patient Office Visit  Subjective  Patient ID: Justin Goodman, male    DOB: 07/09/1955  Age: 68 y.o. MRN: [id number]  Chief Complaint  Patient presents with   Diabetes    HPI  Hypertension- Pt denies chest pain, SOB, dizziness, or heart palpitations.  Taking meds as directed w/o problems.  Denies medication side effects.    Diabetes - no hypoglycemic events. No wounds or sores that are not healing well. No increased thirst or urination. Checking glucose at home. Taking medications as prescribed without any side effects.  Wants to discuss that he has concerns about his memory he will noticed that he goes into a room and is not where he was planning on going to get something.  He says he missed his eye appointment twice because he forgot about it so he is feeling concerned no family history of Alzheimer's or dementia.     ROS    Objective:     BP (!) 119/53   Pulse 64   Ht 5\' 6"  (1.676 m)   Wt 153 lb (69.4 kg)   SpO2 98%   BMI 24.69 kg/m    Physical Exam Vitals and nursing note reviewed.  Constitutional:      Appearance: Normal appearance.  HENT:     Head: Normocephalic and atraumatic.  Eyes:     Conjunctiva/sclera: Conjunctivae normal.  Cardiovascular:     Rate and Rhythm: Normal rate and regular rhythm.  Pulmonary:     Effort: Pulmonary effort is normal.     Breath sounds: Normal breath sounds.  Skin:    General: Skin is warm and dry.  Neurological:     Mental Status: He is alert.  Psychiatric:        Mood and Affect: Mood normal.      Results for orders placed or performed in visit on 07/14/23  POCT HgB A1C  Result Value Ref Range   Hemoglobin A1C 7.4 (A) 4.0 - 5.6 %   HbA1c POC (<> result, manual entry)     HbA1c, POC (prediabetic range)     HbA1c, POC (controlled diabetic range)           No data to display            The 10-year ASCVD risk score (Arnett DK, et al., 2019) is: 36.8%    Assessment & Plan:   Problem List  Items Addressed This Visit       Cardiovascular and Mediastinum   Essential hypertension, benign   The fall we had actually stopped his metoprolol and losartan at some point we actually took them off his medication list because he was having recurring low blood pressures.  He says he is still taking the metoprolol and the losartan at home and needs new prescriptions he is taking a whole tab of each.      Relevant Medications   losartan (COZAAR) 25 MG tablet   metoprolol succinate (TOPROL-XL) 25 MG 24 hr tablet   Other Relevant Orders   CMP14+EGFR   Lipid panel   CBC   RPR   B12   Zinc   TSH   Sedimentation rate     Endocrine   Uncontrolled diabetes mellitus with hyperglycemia (HCC) - Primary   A1c today looks great at 7.4 he is really done fantastic he is now on Toujeo 30 units and tolerating well with any without any hypoglycemia.      Relevant Medications   losartan (COZAAR) 25 MG tablet  Other Relevant Orders   POCT HgB A1C (Completed)   CMP14+EGFR   Lipid panel   CBC   RPR   B12   Zinc   TSH   Sedimentation rate     Other   Memory impairment   Discussed  further workup with additional labs to look for reversible causes.  Will do a Moca today.  Again no family history.  Former smoker, so vascular dementia certainly on the differential.  MOCA score of /30      Relevant Orders   CMP14+EGFR   Lipid panel   CBC   RPR   B12   Zinc   TSH   Sedimentation rate    Return in about 14 weeks (around 10/20/2023) for Diabetes follow-up.    Nani Gasser, MD

## 2023-07-14 NOTE — Assessment & Plan Note (Addendum)
 Discussed  further workup with additional labs to look for reversible causes.  Will do a Moca today.  Again no family history.  Former smoker, so vascular dementia certainly on the differential.  MOCA score of /30

## 2023-07-14 NOTE — Assessment & Plan Note (Signed)
 The fall we had actually stopped his metoprolol and losartan at some point we actually took them off his medication list because he was having recurring low blood pressures.  He says he is still taking the metoprolol and the losartan at home and needs new prescriptions he is taking a whole tab of each.

## 2023-07-17 ENCOUNTER — Encounter: Payer: Self-pay | Admitting: Family Medicine

## 2023-07-17 ENCOUNTER — Telehealth: Payer: Self-pay

## 2023-07-17 LAB — CMP14+EGFR
ALT: 13 IU/L (ref 0–44)
AST: 21 IU/L (ref 0–40)
Albumin: 4.6 g/dL (ref 3.9–4.9)
Alkaline Phosphatase: 86 IU/L (ref 44–121)
BUN/Creatinine Ratio: 22 (ref 10–24)
BUN: 17 mg/dL (ref 8–27)
Bilirubin Total: 0.3 mg/dL (ref 0.0–1.2)
CO2: 24 mmol/L (ref 20–29)
Calcium: 10.1 mg/dL (ref 8.6–10.2)
Chloride: 98 mmol/L (ref 96–106)
Creatinine, Ser: 0.76 mg/dL (ref 0.76–1.27)
Globulin, Total: 2.6 g/dL (ref 1.5–4.5)
Glucose: 74 mg/dL (ref 70–99)
Potassium: 4.8 mmol/L (ref 3.5–5.2)
Sodium: 136 mmol/L (ref 134–144)
Total Protein: 7.2 g/dL (ref 6.0–8.5)
eGFR: 97 mL/min/{1.73_m2} (ref >59)

## 2023-07-17 LAB — VITAMIN B12: Vitamin B-12: 372 pg/mL (ref 232–1245)

## 2023-07-17 LAB — LIPID PANEL
Chol/HDL Ratio: 4.1 ratio (ref 0.0–5.0)
Cholesterol, Total: 146 mg/dL (ref 100–199)
HDL: 36 mg/dL — ABNORMAL LOW (ref >39)
LDL Chol Calc (NIH): 87 mg/dL (ref 0–99)
Triglycerides: 125 mg/dL (ref 0–149)
VLDL Cholesterol Cal: 23 mg/dL (ref 5–40)

## 2023-07-17 LAB — CBC
Hematocrit: 47.7 % (ref 37.5–51.0)
Hemoglobin: 15.9 g/dL (ref 13.0–17.7)
MCH: 28.8 pg (ref 26.6–33.0)
MCHC: 33.3 g/dL (ref 31.5–35.7)
MCV: 86 fL (ref 79–97)
Platelets: 166 10*3/uL (ref 150–450)
RBC: 5.53 x10E6/uL (ref 4.14–5.80)
RDW: 14.2 % (ref 11.6–15.4)
WBC: 3.9 10*3/uL (ref 3.4–10.8)

## 2023-07-17 LAB — RPR: RPR Ser Ql: NONREACTIVE

## 2023-07-17 LAB — TSH: TSH: 2.51 u[IU]/mL (ref 0.450–4.500)

## 2023-07-17 LAB — ZINC: Zinc: 77 ug/dL (ref 44–115)

## 2023-07-17 LAB — SEDIMENTATION RATE: Sed Rate: 32 mm/h — ABNORMAL HIGH (ref 0–30)

## 2023-07-17 MED ORDER — TRESIBA FLEXTOUCH 200 UNIT/ML ~~LOC~~ SOPN: 36.0000 [IU] | PEN_INJECTOR | SUBCUTANEOUS | 0 refills | Status: AC

## 2023-07-17 NOTE — Progress Notes (Signed)
   07/17/2023  Patient ID: Justin Goodman, male   DOB: 06/05/1955, 68 y.o.   MRN: [id number]  Patient calling b/c he is out of long-acting insulin, and Evaristo Bury has not yet arrived from Novo PAP.   Able to get 30 day free voucher information from [facility].  Processing information included on pending prescription for Tresiba 200 units/ml for Dr. Linford Arnold to sign if in agreement.  [name], PharmD, DPLA

## 2023-07-17 NOTE — Progress Notes (Signed)
 Cholesterol overall looks good continue with current dose and regimen of pravastatin.  Your inflammatory markers up just a little bit not in the range that we would typically see with autoimmune disorders.  Metabolic panel including liver and kidney looks great.  Blood count is normal no sign of anemia or acute infection.  Your vitamin B12 is normal, no deficiency there.  Negative for syphilis.  Zinc level looks great.  Thyroid looks perfect.  We could also consider a brain MRI to work up further your memory concerns if you would like to do so.  I also like to get you scheduled for lung cancer screening.  If you are okay with that then please let me know.

## 2023-07-17 NOTE — Telephone Encounter (Signed)
 Patient called requesting lab results (he stated he does not use MyChart). Read provider's note verbatim. Patient verbalized understanding and expressed no additional questions or concerns at this time. He also declined to proceed with an MRI or lung cancer screening. Offered to connect him with a nurse for further clinical explanation, but he declined that as well.

## 2023-07-20 DIAGNOSIS — M79645 Pain in left finger(s): Secondary | ICD-10-CM | POA: Diagnosis not present

## 2023-07-20 DIAGNOSIS — M25642 Stiffness of left hand, not elsewhere classified: Secondary | ICD-10-CM | POA: Diagnosis not present

## 2023-07-21 ENCOUNTER — Telehealth: Payer: Self-pay

## 2023-07-21 NOTE — Telephone Encounter (Signed)
 Received from pt assistance  Tresiba flextouch U200  3 boxes  And  Novofine 32G tip pen needles  4 boxes.  Patient informed and will pick this up at his convenience.

## 2023-07-24 ENCOUNTER — Other Ambulatory Visit: Payer: Self-pay | Admitting: Family Medicine

## 2023-07-24 DIAGNOSIS — M48062 Spinal stenosis, lumbar region with neurogenic claudication: Secondary | ICD-10-CM

## 2023-07-27 DIAGNOSIS — M25642 Stiffness of left hand, not elsewhere classified: Secondary | ICD-10-CM | POA: Diagnosis not present

## 2023-07-27 DIAGNOSIS — M79645 Pain in left finger(s): Secondary | ICD-10-CM | POA: Diagnosis not present

## 2023-07-28 ENCOUNTER — Other Ambulatory Visit: Payer: Self-pay | Admitting: Family Medicine

## 2023-07-28 DIAGNOSIS — M48062 Spinal stenosis, lumbar region with neurogenic claudication: Secondary | ICD-10-CM

## 2023-07-28 DIAGNOSIS — E1142 Type 2 diabetes mellitus with diabetic polyneuropathy: Secondary | ICD-10-CM

## 2023-07-28 NOTE — Telephone Encounter (Signed)
 Copied from CRM [id number]. Topic: Clinical - Medication Refill >> Jul 28, 2023  9:18 AM Arlie Benedict B wrote: Most Recent Primary Care Visit:  Provider: Duaine German D  Department: [facility]  Visit Type: OFFICE VISIT  Date: 07/14/2023  Medication: gabapentin  (NEURONTIN ) 600 MG tablet  Has the patient contacted their pharmacy? Yes  (Agent: If yes, when and what did the pharmacy advise?)Patient was standing at the pharmacy counter advising he is totally out of the medication and needs the refill today.   Is this the correct pharmacy for this prescription? Yes If no, delete pharmacy and type the correct one.  This is the patient's preferred pharmacy:  [id number] [place], [place] - [address] [place] [address] Phone: [phone] Fax: [phone]   Has the prescription been filled recently? Yes  Is the patient out of the medication? Yes  Has the patient been seen for an appointment in the last year OR does the patient have an upcoming appointment? Yes  Can we respond through MyChart? no  Agent: Please be advised that Rx refills may take up to 3 business days. We ask that you follow-up with your pharmacy.

## 2023-08-04 DIAGNOSIS — M79642 Pain in left hand: Secondary | ICD-10-CM | POA: Diagnosis not present

## 2023-08-06 DIAGNOSIS — M79645 Pain in left finger(s): Secondary | ICD-10-CM | POA: Diagnosis not present

## 2023-08-06 DIAGNOSIS — M25642 Stiffness of left hand, not elsewhere classified: Secondary | ICD-10-CM | POA: Diagnosis not present

## 2023-08-10 DIAGNOSIS — M79645 Pain in left finger(s): Secondary | ICD-10-CM | POA: Diagnosis not present

## 2023-08-10 DIAGNOSIS — M25642 Stiffness of left hand, not elsewhere classified: Secondary | ICD-10-CM | POA: Diagnosis not present

## 2023-08-14 DIAGNOSIS — H524 Presbyopia: Secondary | ICD-10-CM | POA: Diagnosis not present

## 2023-08-14 DIAGNOSIS — H5213 Myopia, bilateral: Secondary | ICD-10-CM | POA: Diagnosis not present

## 2023-08-14 DIAGNOSIS — H52223 Regular astigmatism, bilateral: Secondary | ICD-10-CM | POA: Diagnosis not present

## 2023-08-14 DIAGNOSIS — H5212 Myopia, left eye: Secondary | ICD-10-CM | POA: Diagnosis not present

## 2023-08-14 LAB — OPHTHALMOLOGY REPORT-SCANNED

## 2023-08-18 DIAGNOSIS — M25642 Stiffness of left hand, not elsewhere classified: Secondary | ICD-10-CM | POA: Diagnosis not present

## 2023-08-18 DIAGNOSIS — M79645 Pain in left finger(s): Secondary | ICD-10-CM | POA: Diagnosis not present

## 2023-08-24 DIAGNOSIS — M79645 Pain in left finger(s): Secondary | ICD-10-CM | POA: Diagnosis not present

## 2023-08-24 DIAGNOSIS — M25642 Stiffness of left hand, not elsewhere classified: Secondary | ICD-10-CM | POA: Diagnosis not present

## 2023-08-27 ENCOUNTER — Telehealth: Payer: Self-pay

## 2023-08-27 NOTE — Progress Notes (Signed)
   08/27/2023  Patient ID: Justin Goodman, male   DOB: [birth year], 68 y.o.   MRN: [id number]  Returning missed call/voicemail from patient.  Patient endorses feeling constantly tired, having no energy, falling asleep off and on throughout the day.  He states he has also been experiencing some SOB with exertion, which is new; does not endorse SOB when at rest.  No other symptoms reported, but patient is curious if one of his medications could be causing/worsening these symptoms  Reviewed medication list with patient, and he states he has been on metoprolol  for quite sometime; so I would not think these symptoms would just now present if caused from this medication.  No other likely culprits in regard to medications, though.  Metoprolol  was stopped in the past due to patient having some hypotension and a fall.  Recent OV BP on 4/8 was 119/53.  Advised patient to stop metoprolol  xl 25mg  daily for 2 weeks to see if symptoms improve.  I will call patient to check in 6/5 but advised him to contact PCP if symptoms worsen and/or additional symptoms arise.  Linn Rich, PharmD, DPLA  SOB Started 2 weeks ago

## 2023-09-05 ENCOUNTER — Other Ambulatory Visit: Payer: Self-pay | Admitting: Family Medicine

## 2023-09-10 ENCOUNTER — Other Ambulatory Visit: Payer: Self-pay

## 2023-09-10 NOTE — Progress Notes (Signed)
   09/10/2023  Patient ID: Georganne Kind, male   DOB: 01/21/1956, 68 y.o.   MRN: [id number]  Subjective/Objective Telephone follow-up to see if patient's symptoms of tiredness, SOB, and lethargy have improved since stopping metoprolol  XL 25mg  daily.  Lethargy/SOB -Patient states he is feeling much better, but that he has resumed taking metoprolol  xl 25mg  daily after only stopping for 1 week -Patient states Dr. Greer Leak informed him to stop 2 medications, and since doing so he is feeling much better.  Patient is unable to state which medications were stopped, stating he threw the bottles out and can't remember the names -Visit notes reflect patient was instructed to stop losartan  and metoprolol  a while back, but he continued to take these medications.  Fill history reflects patient has continued to take consistently, also -Patient had a fall and some hypotension that led to recommendation to stop BP medications  Assessment/Plan  Lethargy/SOB -Patient sees PCP again 7/17 -Based on BP readings, HR, and how patient is feeling, could consider stopping at least metoprolol  XL 25mg  daily  Linn Rich, PharmD, DPLA

## 2023-09-21 ENCOUNTER — Telehealth: Payer: Self-pay

## 2023-09-21 NOTE — Telephone Encounter (Signed)
  [facility]  Was told that patient has never picked up Miralax  as was not covered by insurance.  [facility] what he was requesting refill for was a pill not a powder.  She had thought he was meaning senna but did not see anything like this in patient chart.

## 2023-09-21 NOTE — Telephone Encounter (Signed)
 Copied from CRM [id number]. Topic: Clinical - Medication Question >> Sep 21, 2023  9:10 AM Shelby Dessert H wrote: Reason for CRM: Patient is calling because he is needing his medicine refilled but he does not know that name of it, he says it helps him go to the restroom, could you assist, patients callback number is [phone].   [id number] [place], [place] - [address] [place] [address] Phone: [phone] Fax: [phone] Hours: Not open 24 hours

## 2023-09-21 NOTE — Telephone Encounter (Signed)
 With you can the only thing that I see on his list is MiraLAX  so he will have to check at home and let us  know.  Otherwise I have no idea

## 2023-09-22 ENCOUNTER — Telehealth: Payer: Self-pay

## 2023-09-22 NOTE — Telephone Encounter (Signed)
 [facility] [address] requesting a  med refill for senna plus docusate. Rx not listed in active med list.

## 2023-09-22 NOTE — Telephone Encounter (Signed)
 Spoke with patient. He does not know the name of the pill  he was given in the past and he does not want to use the powder. He will discuss this further at his upcoming appt with DR. Metheney next month.

## 2023-09-23 MED ORDER — SENNOSIDES-DOCUSATE SODIUM 8.6-50 MG PO TABS: 1.0000 | ORAL_TABLET | Freq: Every evening | ORAL | 1 refills | Status: AC | PRN

## 2023-09-23 NOTE — Telephone Encounter (Signed)
 If he is not particular about brand then I am just going to send over some Senokot as.  Meds ordered this encounter  Medications   senna-docusate (SENOKOT-S) 8.6-50 MG tablet    Sig: Take 1 tablet by mouth at bedtime as needed for mild constipation.    Dispense:  90 tablet    Refill:  1

## 2023-09-23 NOTE — Telephone Encounter (Signed)
 Already sent over Senokot-S and different phone note.

## 2023-09-23 NOTE — Addendum Note (Signed)
 Addended by: Lynnex Fulp D on: 09/23/2023 04:29 PM   Modules accepted: Orders

## 2023-10-01 ENCOUNTER — Other Ambulatory Visit: Payer: Self-pay

## 2023-10-01 DIAGNOSIS — E1142 Type 2 diabetes mellitus with diabetic polyneuropathy: Secondary | ICD-10-CM

## 2023-10-01 NOTE — Telephone Encounter (Signed)
 [facility] see [facility] stating that patient keeps requesting gabapentin  refill about 20 days too soon.  Pharmacist states in a little over a year was 72 days early for the prescription - this fill he would be 25 days early.  Spoke with patient to inquire about this  He states he does not know why this is happening because he is only taking the 6 tablets prescribed daily,  he has not lost any tablets and he lives alone so no one could be taking the medication from his home  He states he is completely out of medication and requesting the refill be sent today or asap to the pharmacy .

## 2023-10-01 NOTE — Telephone Encounter (Signed)
 Copied from CRM [id number]. Topic: Clinical - Prescription Issue >> Oct 01, 2023 12:10 PM Alfonso ORN wrote: Reason for CRM: joel from [facility] pharmacy recieved a refill for gabapentin  (NEURONTIN ) 600 MG tablet was last refill in April ,  in past few months patient been requesting a refill for the  gabapentin  (NEURONTIN ) 600 MG 20 days early or early pharmacy is questioning why patient is requesting refill so much and should not be running out and requesting  again way early than should    [id number] [place], [place] - [address] [place] [address] Phone: [phone] Fax: [phone] Hours: Not open 24 hours

## 2023-10-02 ENCOUNTER — Telehealth: Payer: Self-pay

## 2023-10-02 NOTE — Telephone Encounter (Signed)
 Copied from CRM [id number]. Topic: Clinical - Prescription Issue >> Oct 01, 2023 12:10 PM Alfonso ORN wrote: Reason for CRM: joel from [facility] pharmacy recieved a refill for gabapentin  (NEURONTIN ) 600 MG tablet was last refill in April ,  in past few months patient been requesting a refill for the  gabapentin  (NEURONTIN ) 600 MG 20 days early or early pharmacy is questioning why patient is requesting refill so much and should not be running out and requesting  again way early than should    [id number] [place], [place] - [address] [place] [address] Phone: [phone] Fax: [phone] Hours: Not open 24 hours >> Oct 02, 2023  9:26 AM Graeme ORN wrote: Patient called back to check status of this. States he has not received a callback. Let him know it has been sent to provider to review. Thank You

## 2023-10-02 NOTE — Telephone Encounter (Signed)
 Patient called to check status on medication refill Forwarding to DR. Metheney - not sure if original message went through  I had contacted the pharmacy. [facility] see [facility] stating that patient keeps requesting gabapentin  refill about 20 days too soon.  Pharmacist states in a little over a year was 72 days early for the prescription - this fill he would be 25 days early.  Spoke with patient to inquire about this  He states he does not know why this is happening because he is only taking the 6 tablets prescribed daily,  he has not lost any tablets and he lives alone so no one could be taking the medication from his home  He states he is completely out of medication and requesting the refill be sent today or asap to the pharmacy .

## 2023-10-02 NOTE — Telephone Encounter (Signed)
 Per patient - he is taking his gabapentin  medication as directed. He states that he does call the pharmacy early to make sure the refills is completed in a timely manner.

## 2023-10-02 NOTE — Telephone Encounter (Signed)
 I am very concerned that he is calling almost a month early. He is already on a max dose.  He can't take extras. That is a safety issues.

## 2023-10-07 NOTE — Telephone Encounter (Signed)
 Okay to call pharmacy and see if they can get fill it early with an override then may not be able to the insurance may deny it.  Also would highly encourage us  to call patient and get him set up with some pillboxes that way he can make sure that he has the right amount of medication and that he is taking it consistently and not filling his prescriptions too early each time.

## 2023-10-11 ENCOUNTER — Other Ambulatory Visit: Payer: Self-pay | Admitting: Family Medicine

## 2023-10-11 DIAGNOSIS — F5101 Primary insomnia: Secondary | ICD-10-CM

## 2023-10-22 ENCOUNTER — Ambulatory Visit (INDEPENDENT_AMBULATORY_CARE_PROVIDER_SITE_OTHER): Admitting: Family Medicine

## 2023-10-22 ENCOUNTER — Encounter: Payer: Self-pay | Admitting: Family Medicine

## 2023-10-22 VITALS — BP 147/72 | HR 90 | Resp 20 | Ht 66.0 in | Wt 157.5 lb

## 2023-10-22 DIAGNOSIS — F5101 Primary insomnia: Secondary | ICD-10-CM

## 2023-10-22 DIAGNOSIS — K5909 Other constipation: Secondary | ICD-10-CM | POA: Diagnosis not present

## 2023-10-22 DIAGNOSIS — E1165 Type 2 diabetes mellitus with hyperglycemia: Secondary | ICD-10-CM | POA: Diagnosis not present

## 2023-10-22 DIAGNOSIS — E1142 Type 2 diabetes mellitus with diabetic polyneuropathy: Secondary | ICD-10-CM | POA: Diagnosis not present

## 2023-10-22 DIAGNOSIS — E785 Hyperlipidemia, unspecified: Secondary | ICD-10-CM | POA: Diagnosis not present

## 2023-10-22 DIAGNOSIS — J984 Other disorders of lung: Secondary | ICD-10-CM

## 2023-10-22 DIAGNOSIS — I10 Essential (primary) hypertension: Secondary | ICD-10-CM

## 2023-10-22 DIAGNOSIS — Z7984 Long term (current) use of oral hypoglycemic drugs: Secondary | ICD-10-CM

## 2023-10-22 DIAGNOSIS — M48062 Spinal stenosis, lumbar region with neurogenic claudication: Secondary | ICD-10-CM

## 2023-10-22 DIAGNOSIS — H6123 Impacted cerumen, bilateral: Secondary | ICD-10-CM | POA: Diagnosis not present

## 2023-10-22 LAB — POCT GLYCOSYLATED HEMOGLOBIN (HGB A1C): Hemoglobin A1C: 6.9 % — AB (ref 4.0–5.6)

## 2023-10-22 MED ORDER — TRAZODONE HCL 100 MG PO TABS: 200.0000 mg | ORAL_TABLET | Freq: Every day | ORAL | 1 refills | Status: DC

## 2023-10-22 MED ORDER — TIZANIDINE HCL 4 MG PO TABS: ORAL_TABLET | ORAL | 1 refills | Status: AC

## 2023-10-22 MED ORDER — PRAVASTATIN SODIUM 20 MG PO TABS: ORAL_TABLET | ORAL | 3 refills | Status: AC

## 2023-10-22 MED ORDER — METOPROLOL SUCCINATE ER 25 MG PO TB24: 25.0000 mg | ORAL_TABLET | Freq: Every day | ORAL | 3 refills | Status: DC

## 2023-10-22 MED ORDER — GUAIFENESIN ER 600 MG PO TB12: 1200.0000 mg | ORAL_TABLET | Freq: Two times a day (BID) | ORAL | 2 refills | Status: AC | PRN

## 2023-10-22 MED ORDER — GABAPENTIN 600 MG PO TABS: ORAL_TABLET | ORAL | 1 refills | Status: AC

## 2023-10-22 MED ORDER — METFORMIN HCL ER 500 MG PO TB24: 1000.0000 mg | ORAL_TABLET | Freq: Two times a day (BID) | ORAL | 3 refills | Status: AC

## 2023-10-22 NOTE — Progress Notes (Signed)
 Established Patient Office Visit  Subjective  Patient ID: Justin Goodman, male    DOB: 27-Dec-1955  Age: 68 y.o. MRN: [id number]  Chief Complaint  Patient presents with   Hypertension   Diabetes    HPI   Diabetes - no hypoglycemic events. No wounds or sores that are not healing well. No increased thirst or urination. Checking glucose at home. Taking medications as prescribed without any side effects.  Having a lot of ear wax.  He says that he cleans his ears in the morning and then by the afternoon he has to clean them again he wants to know if there are some type of medicine or drops that he could use.  He has not had any pain or discharge per se.  He also notices that he has been more constipated he currently takes 1 Senokot nightly and wonders if he could actually go up on his dose.  He also reports he still struggling with sleep.  He goes to bed around 10 PM and then around 3 AM wakes up and then just has fitful sleep until about 6 AM he will fall asleep for just a couple minutes and then wake back up.  He has been in this pattern nightly for quite some time he currently takes trazodone  150 mg.  He denies any anxiety or recent med changes or change in caffeine intake.  And he usually falls asleep without significant difficulty.  Also still really struggling with his neuropathy.  He feels like the gabapentin  is just no longer really controlling his pain well he is currently on 1200 mg 3 times a day.    ROS    Objective:     BP (!) 147/72 (BP Location: Left Arm, Patient Position: Sitting, Cuff Size: Normal)   Pulse 90   Resp 20   Ht 5' 6 (1.676 m)   Wt 157 lb 8 oz (71.4 kg)   SpO2 97%   BMI 25.42 kg/m    Physical Exam Vitals and nursing note reviewed.  Constitutional:      Appearance: Normal appearance.  HENT:     Head: Normocephalic and atraumatic.  Eyes:     Conjunctiva/sclera: Conjunctivae normal.  Cardiovascular:     Rate and Rhythm: Normal rate and regular  rhythm.  Pulmonary:     Effort: Pulmonary effort is normal.     Breath sounds: Normal breath sounds.  Skin:    General: Skin is warm and dry.  Neurological:     Mental Status: He is alert.  Psychiatric:        Mood and Affect: Mood normal.      Results for orders placed or performed in visit on 10/22/23  POCT HgB A1C  Result Value Ref Range   Hemoglobin A1C 6.9 (A) 4.0 - 5.6 %   HbA1c POC (<> result, manual entry)     HbA1c, POC (prediabetic range)     HbA1c, POC (controlled diabetic range)        The 10-year ASCVD risk score (Arnett DK, et al., 2019) is: 43.6%    Assessment & Plan:   Problem List Items Addressed This Visit       Cardiovascular and Mediastinum   Essential hypertension, benign   Will recheck BP before leaves      Relevant Medications   pravastatin  (PRAVACHOL ) 20 MG tablet   metoprolol  succinate (TOPROL -XL) 25 MG 24 hr tablet     Respiratory   Restrictive lung disease   He has a  lot of mucus production in the morning and feels like it is just getting stuck in his throat and wants to know if there is anything that he can take for that.  Will do a trial of high-dose guaifenesin  to see if that is helpful.  He denies any obstructive sensation with swallowing or eating.        Digestive   Chronic constipation   K to increase Senokot to twice a day as needed.  Stay well-hydrated        Endocrine   Uncontrolled diabetes mellitus with hyperglycemia (HCC) - Primary     He was able to bring his A1c down to 6.9 and is not having any hypoglycemic events which is great.  Will continue current regimen of metformin , Farxiga, and Tresiba .  We still have some room to bump up the [place] as long as he is doing well.      Relevant Medications   pravastatin  (PRAVACHOL ) 20 MG tablet   metFORMIN  (GLUCOPHAGE -XR) 500 MG 24 hr tablet   Other Relevant Orders   POCT HgB A1C (Completed)   Diabetic peripheral neuropathy (HCC)   Discussed need for referral to  neurology at this point since not getting relief.      Relevant Medications   traZODone  (DESYREL ) 100 MG tablet   tiZANidine  (ZANAFLEX ) 4 MG tablet   pravastatin  (PRAVACHOL ) 20 MG tablet   metFORMIN  (GLUCOPHAGE -XR) 500 MG 24 hr tablet   gabapentin  (NEURONTIN ) 600 MG tablet     Other   Lumbar stenosis with neurogenic claudication   We discussed that next appropriate step would be referral to neurology since he has tried a couple different things for his neuropathy and has almost maxed out the gabapentin  is still not really getting significant relief.      Relevant Medications   tiZANidine  (ZANAFLEX ) 4 MG tablet   meloxicam  (MOBIC ) 7.5 MG tablet   Insomnia   We did discuss options including switching off of the trazodone  but I did discuss with him that he would have to taper off but he does not want to do that.  So we did discuss possibly going up on his dose to 200 mg.      Relevant Medications   traZODone  (DESYREL ) 100 MG tablet   Hyperlipidemia   Relevant Medications   pravastatin  (PRAVACHOL ) 20 MG tablet   metoprolol  succinate (TOPROL -XL) 25 MG 24 hr tablet   Other Visit Diagnoses       Well controlled type 2 diabetes mellitus with peripheral neuropathy (HCC)       Relevant Medications   traZODone  (DESYREL ) 100 MG tablet   tiZANidine  (ZANAFLEX ) 4 MG tablet   pravastatin  (PRAVACHOL ) 20 MG tablet   metFORMIN  (GLUCOPHAGE -XR) 500 MG 24 hr tablet   gabapentin  (NEURONTIN ) 600 MG tablet     Bilateral impacted cerumen          Lateral cerumen impaction on exam-discussed irrigating the ears and he may actually have decreased wax production.  I cannot see his tympanic membranes.  Indication: Cerumen impaction of the ear(s) Medical necessity statement: On physical examination, cerumen impairs clinically significant portions of the external auditory canal, and tympanic membrane. Noted obstructive, copious cerumen that cannot be removed without magnification and  instrumentations Consent: Discussed benefits and risks of procedure and verbal consent obtained Procedure: Patient was prepped for the procedure. Utilized an otoscope to assess and take note of the ear canal, the tympanic membrane, and the presence, amount, and placement of the cerumen. Gentle water  irrigation and soft plastic curette was utilized to remove cerumen.  Post procedure examination: shows cerumen was completely removed. Patient tolerated procedure well. The patient is made aware that they may experience temporary vertigo, temporary hearing loss, and temporary discomfort. If these symptom last for more than 24 hours to call the clinic or proceed to the ED.    Return in about 3 months (around 01/22/2024) for Diabetes follow-up.    Dorothyann Byars, MD

## 2023-10-22 NOTE — Patient Instructions (Signed)
 Okay to increase your bowel medication to twice a day to see if that helps.

## 2023-10-22 NOTE — Assessment & Plan Note (Signed)
 Discussed need for referral to neurology at this point since not getting relief.

## 2023-10-22 NOTE — Assessment & Plan Note (Signed)
 Will recheck BP before leaves

## 2023-10-22 NOTE — Assessment & Plan Note (Signed)
 We discussed that next appropriate step would be referral to neurology since he has tried a couple different things for his neuropathy and has almost maxed out the gabapentin  is still not really getting significant relief.

## 2023-10-22 NOTE — Telephone Encounter (Signed)
 Copied from CRM [id number]. Topic: Clinical - Prescription Issue >> Oct 22, 2023 12:10 PM Diannia H wrote: Reason for CRM: Kathlean from [facility] was calling to get verification on the patient gabapentin , she is wanting to know did the dosage changed or did anything change, callback number is [phone].  Justin Goodman

## 2023-10-22 NOTE — Assessment & Plan Note (Signed)
 We did discuss options including switching off of the trazodone  but I did discuss with him that he would have to taper off but he does not want to do that.  So we did discuss possibly going up on his dose to 200 mg.

## 2023-10-22 NOTE — Assessment & Plan Note (Signed)
 He has a lot of mucus production in the morning and feels like it is just getting stuck in his throat and wants to know if there is anything that he can take for that.  Will do a trial of high-dose guaifenesin  to see if that is helpful.  He denies any obstructive sensation with swallowing or eating.

## 2023-10-22 NOTE — Assessment & Plan Note (Signed)
 K to increase Senokot to twice a day as needed.  Stay well-hydrated

## 2023-10-22 NOTE — Assessment & Plan Note (Signed)
 He was able to bring his A1c down to 6.9 and is not having any hypoglycemic events which is great.  Will continue current regimen of metformin , Farxiga, and Tresiba .  We still have some room to bump up the [place] as long as he is doing well.

## 2023-10-23 NOTE — Telephone Encounter (Signed)
 I did not change his dose. Does it look different in their system?

## 2023-10-23 NOTE — Addendum Note (Signed)
 Addended by: Palmina Clodfelter D on: 10/23/2023 05:23 PM   Modules accepted: Orders

## 2023-10-25 ENCOUNTER — Other Ambulatory Visit: Payer: Self-pay | Admitting: Family Medicine

## 2023-10-26 ENCOUNTER — Telehealth: Payer: Self-pay | Admitting: Family Medicine

## 2023-10-26 NOTE — Telephone Encounter (Signed)
 Copied from CRM [id number]. Topic: Referral - Status >> Oct 26, 2023  9:12 AM Graeme ORN wrote: Reason for CRM: Patient called. States provider was supposed to put in referral for foot doctor. He has not heard anything back yet. Not showing referral for foot doctor. Just Neurology. Patient would like a call back. Thank You

## 2023-11-05 ENCOUNTER — Other Ambulatory Visit: Payer: Self-pay | Admitting: Family Medicine

## 2023-11-05 DIAGNOSIS — I10 Essential (primary) hypertension: Secondary | ICD-10-CM

## 2023-11-05 NOTE — Telephone Encounter (Unsigned)
 Copied from CRM [id number]. Topic: Clinical - Medication Refill >> Nov 05, 2023 10:18 AM Diannia H wrote: Medication: losartan  (COZAAR ) 25 MG tablet  Has the patient contacted their pharmacy? Yes (Agent: If no, request that the patient contact the pharmacy for the refill. If patient does not wish to contact the pharmacy document the reason why and proceed with request.) (Agent: If yes, when and what did the pharmacy advise?)  This is the patient's preferred pharmacy:  [facility] - [place], [place] - [address] [place] [address] Phone: [phone] Fax: [phone]  [facility] [place], [place] [facility] [address] [place] [address] Phone: [phone] Fax: [phone]  Is this the correct pharmacy for this prescription? Yes If no, delete pharmacy and type the correct one.   Has the prescription been filled recently? No  Is the patient out of the medication? Yes  Has the patient been seen for an appointment in the last year OR does the patient have an upcoming appointment? Yes  Can we respond through MyChart? Yes  Agent: Please be advised that Rx refills may take up to 3 business days. We ask that you follow-up with your pharmacy.

## 2023-11-06 ENCOUNTER — Other Ambulatory Visit: Payer: Self-pay | Admitting: Family Medicine

## 2023-11-06 DIAGNOSIS — I10 Essential (primary) hypertension: Secondary | ICD-10-CM

## 2023-11-06 MED ORDER — LOSARTAN POTASSIUM 25 MG PO TABS: 12.5000 mg | ORAL_TABLET | Freq: Every day | ORAL | 3 refills | Status: AC

## 2023-11-06 NOTE — Telephone Encounter (Signed)
 Requesting rx rf of Losartan  to [facility]  But showing as allergy in allergy list  from 2014 Last [facility]  Last OV 10/22/2023 Upcoming appt 01/13/2024

## 2023-11-10 ENCOUNTER — Ambulatory Visit (INDEPENDENT_AMBULATORY_CARE_PROVIDER_SITE_OTHER): Admitting: Family Medicine

## 2023-11-10 ENCOUNTER — Other Ambulatory Visit: Payer: Self-pay | Admitting: *Deleted

## 2023-11-10 VITALS — BP 124/60 | HR 81 | Temp 97.5°F | Ht 66.0 in | Wt 160.0 lb

## 2023-11-10 DIAGNOSIS — J4489 Other specified chronic obstructive pulmonary disease: Secondary | ICD-10-CM

## 2023-11-10 DIAGNOSIS — J3489 Other specified disorders of nose and nasal sinuses: Secondary | ICD-10-CM

## 2023-11-10 DIAGNOSIS — E1142 Type 2 diabetes mellitus with diabetic polyneuropathy: Secondary | ICD-10-CM

## 2023-11-10 DIAGNOSIS — J029 Acute pharyngitis, unspecified: Secondary | ICD-10-CM

## 2023-11-10 MED ORDER — AMOXICILLIN-POT CLAVULANATE 875-125 MG PO TABS
1.0000 | ORAL_TABLET | Freq: Two times a day (BID) | ORAL | 0 refills | Status: DC
Start: 2023-11-10 — End: 2023-11-17

## 2023-11-10 MED ORDER — AMBULATORY NON FORMULARY MEDICATION: 0 refills | Status: DC

## 2023-11-10 NOTE — Progress Notes (Signed)
 Pt reports that he has been experiencing a sore throat x 2 weeks. He stated that it hurts to swallow and every morning he wakes up he said that his head feels heavy and it affects his eyes.  He has been using Mucinex  Sore throat spray and Mucinex  12 hour tablets. He stated that these did not help.

## 2023-11-10 NOTE — Assessment & Plan Note (Signed)
 He says the Mucinex  600 mg really was not helpful in helping to break up the mucus.  He wants to know if there is an alternative we discussed that there really is not anything that is more effective staying hydrated, staying active.  Using his inhalers when needed.  He might benefit from a flutter valve.

## 2023-11-10 NOTE — Progress Notes (Signed)
 Acute Office Visit  Subjective:     Patient ID: Justin Goodman, male    DOB: [birth year], 68 y.o.   MRN: [id number]  No chief complaint on file.   HPI Patient is in today for    Pt reports that he has been experiencing a sore throat x 2 weeks. He stated that it hurts to swallow and every morning he wakes up he said that his head feels heavy and it affects his eyes. No fevers.  Painful to swallow.  No known sick contacts.   He has been using Mucinex  Sore throat spray and Mucinex  12 hour tablets. He stated that these did not help.   Really runny nose and pressure on top of the head.       Needs metoprolol  sent to local pharmacy. Never got delivered from [facility].   Low up COPD-he did want to let me know that the Mucinex  was not helpful for helping to break up the mucus he wants and if there is something else we could try.  ROS      Objective:    BP 124/60   Pulse 81   Temp (!) 97.5 F (36.4 C) (Oral)   Ht 5' 6 (1.676 m)   Wt 160 lb 0.6 oz (72.6 kg)   SpO2 97%   BMI 25.83 kg/m    Physical Exam Vitals and nursing note reviewed.  Constitutional:      Appearance: Normal appearance.  HENT:     Head: Normocephalic and atraumatic.     Right Ear: Tympanic membrane, ear canal and external ear normal. There is no impacted cerumen.     Left Ear: Tympanic membrane, ear canal and external ear normal. There is no impacted cerumen.     Nose: Nose normal.     Mouth/Throat:     Mouth: Mucous membranes are dry.     Pharynx: Oropharynx is clear.  Eyes:     Conjunctiva/sclera: Conjunctivae normal.  Cardiovascular:     Rate and Rhythm: Normal rate and regular rhythm.  Pulmonary:     Effort: Pulmonary effort is normal.     Breath sounds: Wheezing present.  Musculoskeletal:     Cervical back: Neck supple. Tenderness present.  Lymphadenopathy:     Cervical: No cervical adenopathy.  Skin:    General: Skin is warm and dry.  Neurological:     Mental Status: He is alert and  oriented to person, place, and time.  Psychiatric:        Mood and Affect: Mood normal.     No results found for any visits on 11/10/23.      Assessment & Plan:   Problem List Items Addressed This Visit       Respiratory   Asthma with COPD (chronic obstructive pulmonary disease) (HCC)   He says the Mucinex  600 mg really was not helpful in helping to break up the mucus.  He wants to know if there is an alternative we discussed that there really is not anything that is more effective staying hydrated, staying active.  Using his inhalers when needed.  He might benefit from a flutter valve.      Relevant Medications   AMBULATORY NON FORMULARY MEDICATION   Other Visit Diagnoses       Sore throat    -  Primary     Sinus pressure         Since symptoms have been persistent for 2 weeks and he is not feeling better we will  go ahead and treat with Augmentin  to cover for sinusitis and strep throat.  Continue to stay well-hydrated.  If not improving by the end of the week please let us  know.  Sore throat-hopefully will improve but if not consider other alternative diagnoses such as thrush.  Meds ordered this encounter  Medications   amoxicillin -clavulanate (AUGMENTIN ) 875-125 MG tablet    Sig: Take 1 tablet by mouth 2 (two) times daily.    Dispense:  14 tablet    Refill:  0   AMBULATORY NON FORMULARY MEDICATION    Sig: Medication Name: Flutter valve Dx. COPD    Dispense:  1 Units    Refill:  0    No follow-ups on file.  Dorothyann Byars, MD

## 2023-11-11 DIAGNOSIS — E119 Type 2 diabetes mellitus without complications: Secondary | ICD-10-CM | POA: Diagnosis not present

## 2023-11-12 LAB — MICROALBUMIN / CREATININE URINE RATIO
Albumin, Urine POC: 30
Creatinine, POC: 46 mg/dL
Microalb Creat Ratio: 652

## 2023-11-13 ENCOUNTER — Ambulatory Visit: Payer: Self-pay

## 2023-11-13 NOTE — Telephone Encounter (Signed)
 Pls contact the pt to schedule appt with Dr. Alvan for foot pain and sore throat x 3 weeks. Thx

## 2023-11-13 NOTE — Telephone Encounter (Signed)
 FYI Only or Action Required?: Action required by provider: referral request, update on patient condition, and requesting assistance with his copay for the xrays of my head.  Patient was last seen in primary care on 11/10/2023 by Alvan Dorothyann BIRCH, MD.  Called Nurse Triage reporting sinus pressure and Sore Throat.  Symptoms began sinus pressure and sore throat x 3 weeks, foot pain x 20 years.  Interventions attempted: Prescription medications: Augmentin , gabapentin .  Symptoms are: sore throat, sinus pain/pressure, severe foot pain (bilateral) unchanged.  Triage Disposition: Call PCP Now  Patient/caregiver understands and will follow disposition?: Yes         Copied from CRM [id number]. Topic: Clinical - Red Word Triage >> Nov 13, 2023 10:28 AM Mercer PEDLAR wrote: Red Word that prompted transfer to Nurse Triage: Severe feet pain. Reason for Disposition  [1] SEVERE pain (e.g., excruciating, pain scale 8-10) AND [2] not improved after pain medications  Answer Assessment - Initial Assessment Questions 1. MAIN CONCERN OR SYMPTOM:  What is your main concern right now? What question do you have? What's the main symptom you're worried about? (e.g., breathing difficulty, cough, fever, pain)     Sore throat, sinus pain/pressure not improved since seeing MD on 11/10/23 and starting on antibiotic. He is also asking if Dr Alvan can refer him to a different foot doctor due to the one she referred him to can not see him until December 1st. He is asking if his PCP can refer him to a new podiatrist to call with the number and so he can cancel the other appt. Patient c/o severe pain in both feet. Patient is also asking if Dr Alvan can take off my copay for the x rays of my head for my memory problems. He states he called his insurance and they advised that he call his PCP and ask her to take the copayment off.  2. ONSET: When did the  sinus pressure, sore throat, severe bilateral foot  pain  start?     Foot pain x 20 years. Sinus pressure and sore throat x 3 weeks.  3. BETTER-SAME-WORSE: Are you getting better, staying the same, or getting worse compared to how you felt at your last visit to the doctor (most recent medical visit)?     The same.  4. VISIT DATE: When were you seen? (e.g., date)     11/10/23.  5. VISIT DOCTOR: What is the name of the doctor taking care of you now?     Dr Alvan.  6. VISIT DIAGNOSIS:  What was the main symptom or problem that you were seen for? Were you given a diagnosis?      Sore throat, sinus pressure.  7. VISIT MEDICINES: Did the doctor order any new medicines for you to use? If Yes, ask: Have you filled the prescription and started taking the medicine?      Augmentin  twice daily since 11/10/23.  8. NEXT APPOINTMENT: Have you scheduled a follow-up appointment with your doctor?     No.  9. PAIN: Is there any pain? If Yes, ask: How bad is it?  (Scale 0-10; or none, mild, moderate, severe)     Yes, severe pain in both feet.  10. FEVER: Do you have a fever? If Yes, ask: What is it, how was it measured  and when did it start?       No.  11. OTHER SYMPTOMS: Do you have any other symptoms?       Patient states at his  last office visit he also talked to his PCP about his memory is bad.  12. PREGNANCY: Is there any chance you are pregnant? When was your last menstrual period?       N/A.  Protocols used: Recent Medical Visit for Illness Follow-up Call-A-[facility]

## 2023-11-17 ENCOUNTER — Ambulatory Visit: Payer: Self-pay

## 2023-11-17 ENCOUNTER — Ambulatory Visit: Admitting: Family Medicine

## 2023-11-17 ENCOUNTER — Telehealth: Payer: Self-pay

## 2023-11-17 MED ORDER — CEFDINIR 300 MG PO CAPS: 300.0000 mg | ORAL_CAPSULE | Freq: Two times a day (BID) | ORAL | 0 refills | Status: DC

## 2023-11-17 NOTE — Telephone Encounter (Signed)
 FYI Only or Action Required?: Action required by provider: Pt needs sooner appt with foot specialist. Pt wants co -pay for imaging waived..  Patient was last seen in primary care on 11/10/2023 by Alvan Dorothyann BIRCH, MD.  Called Nurse Triage reporting Sore Throat and Sinusitis.  Symptoms began a week ago.  Interventions attempted: Other: seen in office   - abx.  Symptoms are: unchanged.  Triage Disposition: See Physician Within 24 Hours  Patient/caregiver understands and will follow disposition?: Yes                       Summary: sore throat and congestion   eason for Triage: patient states he has continuing issues, sore throat and sinus congestion. Feels as if medication is not working. Please advise.     Reason for Disposition  [1] Taking antibiotic > 72 hours (3 days) AND [2] sinus pain not improved  Answer Assessment - Initial Assessment Questions 1. LOCATION: Where does it hurt?      sinus 2. ONSET: When did the sinus pain start?  (e.g., hours, days)      Seen 8/5 for same complaint 3. SEVERITY: How bad is the pain?   (Scale 0-10; or none, mild, moderate or severe)     moderate 4. RECURRENT SYMPTOM: Have you ever had sinus problems before? If Yes, ask: When was the last time? and What happened that time?      yes 8. OTHER SYMPTOMS: Do you have any other symptoms? (e.g., sore throat, cough, earache, difficulty breathing)     Head feels heavy. Pt cannot wait to see foot specialist  - current appt is in December. Also pt wants provider to remove co-pay for imaging.  Answer Assessment - Initial Assessment Questions 1. ANTIBIOTIC: What antibiotic are you taking? How many times a day?     Augmentin  2. ONSET: When was the antibiotic started?     11/10/2023  5. SYMPTOMS: Are there any other symptoms you're concerned about? If Yes, ask: When did it start?     Sinus pressure  Protocols used: Sinus Pain or Congestion-A-AH, Sinus  Infection on Antibiotic Follow-up Call-A-[facility]

## 2023-11-17 NOTE — Telephone Encounter (Signed)
 OK to fill as whole and just remind pt to only take half.

## 2023-11-17 NOTE — Telephone Encounter (Signed)
 Pt called and reports his sinus congestion and ST if not better after the augmentin . Will change to Onicef. If not better will need f/u appt.    Meds ordered this encounter  Medications   cefdinir  (OMNICEF ) 300 MG capsule    Sig: Take 1 capsule (300 mg total) by mouth 2 (two) times daily.    Dispense:  14 capsule    Refill:  0

## 2023-11-17 NOTE — Telephone Encounter (Signed)
 Copied from CRM [id number]. Topic: Clinical - Prescription Issue >> Nov 17, 2023 10:48 AM Graeme ORN wrote: Reason for CRM: Pharmacy called. Note on Losartan  to split tablets. They are unable to split tablet. Asking if its ok to fill whole Can call back REF# [id number]

## 2023-11-17 NOTE — Progress Notes (Deleted)
   Acute Office Visit  Subjective:     Patient ID: Justin Goodman, male    DOB: [birth year], 68 y.o.   MRN: [id number]  No chief complaint on file.   HPI Patient is in today for ***  ROS      Objective:    There were no vitals taken for this visit. {Vitals History (Optional):23777}  Physical Exam  No results found for any visits on 11/17/23.      Assessment & Plan:   Problem List Items Addressed This Visit   None   No orders of the defined types were placed in this encounter.   No follow-ups on file.  Dorothyann Byars, MD

## 2023-11-17 NOTE — Telephone Encounter (Signed)
 Patient informed that abx changed  and to return to clinic if not improved after use.

## 2023-11-17 NOTE — Telephone Encounter (Signed)
 Patient informed.

## 2023-11-18 ENCOUNTER — Telehealth: Payer: Self-pay

## 2023-11-18 NOTE — Telephone Encounter (Signed)
 Called patient. We have received = Patient assistance medication -  Tresiba  200units- 3 boxes of 3 pens  Patient states will pick this up tomorrow morning

## 2023-11-20 ENCOUNTER — Ambulatory Visit: Payer: Self-pay

## 2023-11-20 NOTE — Telephone Encounter (Addendum)
 FYI Only or Action Required?: Action required by provider: request for appointment.  Patient was last seen in primary care on 11/10/2023 by Alvan Dorothyann BIRCH, MD.  Called Nurse Triage reporting No chief complaint on file..  Symptoms began .11/17/23  Interventions attempted: Prescription medications: abx.  Symptoms are: gradually worsening.  Pt only wants to see PCP. Advised pt to go to [facility] if worsens during the weekend. Appt made with PCP next Tuesday      Triage Disposition: See Physician Within 24 Hours  Patient/caregiver understands and will follow disposition?:         Reason for Disposition  [1] Taking antibiotic > 72 hours (3 days) AND [2] sinus pain not improved  Answer Assessment - Initial Assessment Questions 1. ANTIBIOTIC: What antibiotic are you taking? How many times a day?     Yes on second  2. ONSET: When was the antibiotic started?     *No Answer* 3. PAIN: How bad is the pain?   (Scale 0-10; or none, mild, moderate or severe)     Yes- severe 4. FEVER: Do you have a fever? If Yes, ask: What is it, how was it measured, and when did it start?      no 5. SYMPTOMS: Are there any other symptoms you're concerned about? If Yes, ask: When did it start?     Sore throat ,dry congested cough  Protocols used: Sinus Infection on Antibiotic Follow-up Call-A-AH

## 2023-11-20 NOTE — Telephone Encounter (Signed)
 Copied from CRM [id number]. Topic: Clinical - Red Word Triage >> Nov 20, 2023  9:03 AM Zane F wrote: [facility] that prompted transfer to Nurse Triage:   Sinus congestion; sore throat; productive dry cough  The medication his PCP gave him for the concern is not working. cefdinir  (OMNICEF ) 300 MG capsule

## 2023-11-24 ENCOUNTER — Ambulatory Visit (INDEPENDENT_AMBULATORY_CARE_PROVIDER_SITE_OTHER): Admitting: Family Medicine

## 2023-11-24 VITALS — BP 138/63 | HR 92 | Temp 98.3°F | Ht 66.0 in | Wt 159.0 lb

## 2023-11-24 DIAGNOSIS — E1142 Type 2 diabetes mellitus with diabetic polyneuropathy: Secondary | ICD-10-CM

## 2023-11-24 DIAGNOSIS — Z7984 Long term (current) use of oral hypoglycemic drugs: Secondary | ICD-10-CM

## 2023-11-24 DIAGNOSIS — R0981 Nasal congestion: Secondary | ICD-10-CM

## 2023-11-24 DIAGNOSIS — J3489 Other specified disorders of nose and nasal sinuses: Secondary | ICD-10-CM | POA: Diagnosis not present

## 2023-11-24 MED ORDER — AZELASTINE HCL 0.1 % NA SOLN: 2.0000 | Freq: Two times a day (BID) | NASAL | 1 refills | Status: AC

## 2023-11-24 NOTE — Assessment & Plan Note (Signed)
 We had placed Neurology ref and it is not until December. He would like ot be seen sooner, so will place a new referral. He feels the gabapenting ins't helping so we can start tapering down.

## 2023-11-24 NOTE — Progress Notes (Unsigned)
 Established Patient Office Visit  Subjective  Patient ID: Justin Goodman, male    DOB: 1955-10-16  Age: 68 y.o. MRN: [id number]  Chief Complaint  Patient presents with   Sinusitis    HPI Her for persistent sinus sxs.  He initially completed a round of Augmentin  but did not improve.  Completed the omnicef  and still not feeling significant relief in his symptoms..   Head feels heavy  with pressure on top of head.. with watery nasal d/c.  Throat is better. Still coughing.  No fever or chills. Still lots of drainage.    He also asked about getting his co-pay waived for imaging and that his PCP would have to do that.  I did let him know that I have no power authorization to waive any kind of co-pay even if it is here in our own office and definitely not with our imaging department.  {History (Optional):23778}  ROS    Objective:     BP 138/63   Pulse 92   Temp 98.3 F (36.8 C) (Oral)   Ht 5' 6 (1.676 m)   Wt 159 lb 0.6 oz (72.1 kg)   SpO2 96%   BMI 25.67 kg/m  {Vitals History (Optional):23777}  Physical Exam Constitutional:      Appearance: Normal appearance.  HENT:     Head: Normocephalic and atraumatic.     Right Ear: Tympanic membrane, ear canal and external ear normal. There is no impacted cerumen.     Left Ear: Tympanic membrane, ear canal and external ear normal. There is no impacted cerumen.     Nose: Nose normal.     Mouth/Throat:     Pharynx: Oropharynx is clear.  Eyes:     Conjunctiva/sclera: Conjunctivae normal.  Cardiovascular:     Rate and Rhythm: Normal rate and regular rhythm.  Pulmonary:     Effort: Pulmonary effort is normal.     Breath sounds: Normal breath sounds.  Musculoskeletal:     Cervical back: Neck supple. No tenderness.  Lymphadenopathy:     Cervical: No cervical adenopathy.  Skin:    General: Skin is warm and dry.  Neurological:     Mental Status: He is alert and oriented to person, place, and time.  Psychiatric:        Mood and  Affect: Mood normal.      No results found for any visits on 11/24/23.  {Labs (Optional):23779}  The 10-year ASCVD risk score (Arnett DK, et al., 2019) is: 40%    Assessment & Plan:   Problem List Items Addressed This Visit       Endocrine   Diabetic peripheral neuropathy (HCC) - Primary   We had placed Neurology ref and it is not until December. He would like ot be seen sooner, so will place a new referral. He feels the gabapenting ins't helping so we can start tapering down.        Relevant Orders   Ambulatory referral to Neurology   Other Visit Diagnoses       Rhinorrhea         Nasal congestion           Rhinorrhea with congestion he is now been on 2 rounds of antibiotics and still has not felt better though the throat is not quite as sore as it was.  Will add Astelin  nasal spray to dry up extra secretions but discussed that at this point I would normally recommend a sinus CT to evaluate for possible  obstruction or more chronic sinusitis or possible ENT referral.  He says specialty referral is $300 and so is hesitant to do either.  With Medicare his call should not be that expensive so I am wondering if maybe it is in network versus out of network issue.   No follow-ups on file.    Dorothyann Byars, MD

## 2023-11-25 ENCOUNTER — Encounter: Payer: Self-pay | Admitting: Family Medicine

## 2023-11-26 NOTE — Telephone Encounter (Signed)
 Referral was not for podiatry this was for neurology

## 2023-11-29 ENCOUNTER — Other Ambulatory Visit: Payer: Self-pay | Admitting: Family Medicine

## 2023-11-29 MED ORDER — DAPAGLIFLOZIN PROPANEDIOL 5 MG PO TABS: 5.0000 mg | ORAL_TABLET | Freq: Every day | ORAL | 3 refills | Status: AC

## 2023-12-02 ENCOUNTER — Ambulatory Visit: Payer: Self-pay | Admitting: Family Medicine

## 2023-12-02 DIAGNOSIS — R801 Persistent proteinuria, unspecified: Secondary | ICD-10-CM

## 2023-12-02 NOTE — Progress Notes (Signed)
 Hi Justin Goodman, your urine sample show you are still spilling a lot of protein in your urine. Can you come by for an extra lab for extra labs.

## 2023-12-08 ENCOUNTER — Encounter: Payer: Self-pay | Admitting: Sports Medicine

## 2023-12-12 ENCOUNTER — Other Ambulatory Visit: Payer: Self-pay | Admitting: Family Medicine

## 2023-12-29 DIAGNOSIS — R801 Persistent proteinuria, unspecified: Secondary | ICD-10-CM | POA: Diagnosis not present

## 2024-01-01 ENCOUNTER — Telehealth: Payer: Self-pay

## 2024-01-01 LAB — MULTIPLE MYELOMA PANEL, SERUM
Albumin SerPl Elph-Mcnc: 3.4 g/dL (ref 2.9–4.4)
Albumin/Glob SerPl: 0.9 (ref 0.7–1.7)
Alpha 1: 0.3 g/dL (ref 0.0–0.4)
Alpha2 Glob SerPl Elph-Mcnc: 1.2 g/dL — AB (ref 0.4–1.0)
B-Globulin SerPl Elph-Mcnc: 1.1 g/dL (ref 0.7–1.3)
Gamma Glob SerPl Elph-Mcnc: 1.5 g/dL (ref 0.4–1.8)
Globulin, Total: 4 g/dL — AB (ref 2.2–3.9)
IgA/Immunoglobulin A, Serum: 194 mg/dL (ref 61–437)
IgG (Immunoglobin G), Serum: 1522 mg/dL (ref 603–1613)
IgM (Immunoglobulin M), Srm: 30 mg/dL (ref 20–172)
Total Protein: 7.4 g/dL (ref 6.0–8.5)

## 2024-01-01 NOTE — Telephone Encounter (Signed)
 PAP: Patient assistance application for Farxiga  through [employer] (AZ[employer]) has been mailed to pt's home address on file. Provider portion of application will be faxed to provider's office.For re-enrollment 2026.

## 2024-01-04 NOTE — Progress Notes (Signed)
 Hi Justin Goodman, we did do a test to check for a condition called multiple myeloma.  You do not have any evidence of an M spike which is reassuring.  The total globulin which is a protein level in the alpha-2 are just borderline elevated so I would not mind rechecking this again in about 6 to 12 months.  Please let us  know where you went for your last eye exam so that we can get an up-to-date report filed into your chart.

## 2024-01-05 ENCOUNTER — Ambulatory Visit: Payer: Self-pay

## 2024-01-05 NOTE — Telephone Encounter (Signed)
 FYI Only or Action Required?: Action required by provider: update on patient condition.  Patient was last seen in primary care on 11/24/2023 by Alvan Dorothyann BIRCH, MD.  Called Nurse Triage reporting Dizziness and Fall.  Symptoms began yesterday.  Interventions attempted: Nothing.  Symptoms are: gradually worsening.  Triage Disposition: Call EMS 911 Now  Patient/caregiver understands and will follow disposition?: No, refuses disposition   RN advised 911 and ER. Pt got angry and refused. RN continued to ask questions and see what other options there were, given that patient said he fell again this am and was able to get to the couch but still dizzy RN again advised ER and no appts available today. Pt stated thanks for nothing and hung up on RN.   Copied from CRM [id number]. Topic: Clinical - Red Word Triage >> Jan 05, 2024 11:13 AM Jasmin G wrote: [facility] that prompted transfer to Nurse Triage: Pt states that he has been feeling extremely dizzy since yesterday and hasn't been able to eat due to this. Pt initially called to schedule an appt ASAP. Reason for Disposition  [1] SEVERE weakness (e.g., unable to walk or barely able to walk, requires support) AND [2] new-onset or getting worse  Answer Assessment - Initial Assessment Questions PT called stating that he has been dizzy since yesterday. He did fall twice, once yesterday, once prior to making this call. He states he is unable to take his insulin , BS 145, can't take insulin . He advised he is unable to drive to the office. PT states he is overall week but hasn't been able to eat. Denies one side weakness or numbness.  1. DESCRIPTION: Describe your dizziness.     Lightheaded.  2. LIGHTHEADED: Do you feel lightheaded? (e.g., somewhat faint, woozy, weak upon standing)     Sat on the floor due to the dizziness 3. VERTIGO: Do you feel like either you or the room is spinning or tilting? (i.e., vertigo)     no 4. SEVERITY: How  bad is it?  Do you feel like you are going to faint? Can you stand and walk?     Unable to walk 5. ONSET:  When did the dizziness begin?     Yesterday 6. AGGRAVATING FACTORS: Does anything make it worse? (e.g., standing, change in head position)     Any movement 7. HEART RATE: Can you tell me your heart rate? How many beats in 15 seconds?  (Note: Not all patients can do this.)       Does not feel fast 8. CAUSE: What do you think is causing the dizziness? (e.g., decreased fluids or food, diarrhea, emotional distress, heat exposure, new medicine, sudden standing, vomiting; unknown)     unknown 9. RECURRENT SYMPTOM: Have you had dizziness before? If Yes, ask: When was the last time? What happened that time?     Happened a long time ago.  10. OTHER SYMPTOMS: Do you have any other symptoms? (e.g., fever, chest pain, vomiting, diarrhea, bleeding)       No other symptoms  Answer Assessment - Initial Assessment Questions 1. MECHANISM: How did the fall happen? dizziness 2. DOMESTIC VIOLENCE AND ELDER ABUSE SCREENING: Did you fall because someone pushed you or tried to hurt you? If Yes, ask: Are you safe now?     Lives alone 3. ONSET: When did the fall happen? (e.g., minutes, hours, or days ago)  OTHER SYMPTOMS: Do you have any other symptoms? (e.g., dizziness, fever, weakness; new-onset or worsening).  Generalized weakness . CAUSE: What do you think caused the fall (or falling)? (e.g., dizzy spell, tripped)       Dizzy, unable to take insulin   Protocols used: Dizziness - Lightheadedness-A-AH, Falls and Falling-A-AH

## 2024-01-05 NOTE — Telephone Encounter (Addendum)
 1324 CAL called and notified.   RN attempted to call CAL. No answer at this time.

## 2024-01-08 ENCOUNTER — Telehealth: Payer: Self-pay

## 2024-01-08 NOTE — Telephone Encounter (Signed)
 I did he has an appointment next week but I also just want him to check his blood pressure he does take blood pressure medication and so if it is running a little low we may need to hold his losartan  completely.  He you can also check his pulse and if his pulse is low we can always hold his metoprolol .

## 2024-01-08 NOTE — Telephone Encounter (Signed)
 PAP: Patient assistance application for Farxiga  through [employer] ([employer]) has been mailed to pt's home address on file. Provider portion of application will be faxed to provider's office.For renewal 2026.

## 2024-01-08 NOTE — Progress Notes (Signed)
 Justin Goodman                                          MRN: [id number]   01/08/2024   The [facility] Quality Team Specialist reviewed this patient medical record for the purposes of chart review for care gap closure. The following were reviewed: abstraction for care gap closure-kidney health evaluation for diabetes:eGFR  and uACR.    [facility] Quality Team

## 2024-01-08 NOTE — Telephone Encounter (Signed)
 This task has been completed as requested. Please review other TE for additional information. Patient notified of the update and has confirmed to keep upcoming appointment scheduled with the provider. No other inquiries during the call. No further action needed.

## 2024-01-12 NOTE — Telephone Encounter (Signed)
 ERROR

## 2024-01-12 NOTE — Telephone Encounter (Signed)
 Received provider portion of Pap application for Farxiga  (AZ&me).

## 2024-01-13 ENCOUNTER — Encounter: Payer: Self-pay | Admitting: Family Medicine

## 2024-01-13 ENCOUNTER — Ambulatory Visit

## 2024-01-13 ENCOUNTER — Ambulatory Visit: Admitting: Family Medicine

## 2024-01-13 ENCOUNTER — Ambulatory Visit: Attending: Family Medicine

## 2024-01-13 VITALS — BP 136/77 | HR 77 | Ht 66.0 in | Wt 152.1 lb

## 2024-01-13 DIAGNOSIS — E1165 Type 2 diabetes mellitus with hyperglycemia: Secondary | ICD-10-CM | POA: Diagnosis not present

## 2024-01-13 DIAGNOSIS — I10 Essential (primary) hypertension: Secondary | ICD-10-CM

## 2024-01-13 DIAGNOSIS — S0081XA Abrasion of other part of head, initial encounter: Secondary | ICD-10-CM | POA: Diagnosis not present

## 2024-01-13 DIAGNOSIS — R296 Repeated falls: Secondary | ICD-10-CM

## 2024-01-13 DIAGNOSIS — J984 Other disorders of lung: Secondary | ICD-10-CM

## 2024-01-13 DIAGNOSIS — R0689 Other abnormalities of breathing: Secondary | ICD-10-CM | POA: Diagnosis not present

## 2024-01-13 DIAGNOSIS — Z7984 Long term (current) use of oral hypoglycemic drugs: Secondary | ICD-10-CM

## 2024-01-13 DIAGNOSIS — W19XXXA Unspecified fall, initial encounter: Secondary | ICD-10-CM

## 2024-01-13 DIAGNOSIS — S6991XA Unspecified injury of right wrist, hand and finger(s), initial encounter: Secondary | ICD-10-CM | POA: Diagnosis not present

## 2024-01-13 DIAGNOSIS — M19041 Primary osteoarthritis, right hand: Secondary | ICD-10-CM | POA: Diagnosis not present

## 2024-01-13 DIAGNOSIS — Z23 Encounter for immunization: Secondary | ICD-10-CM

## 2024-01-13 DIAGNOSIS — Z043 Encounter for examination and observation following other accident: Secondary | ICD-10-CM | POA: Diagnosis not present

## 2024-01-13 DIAGNOSIS — M79641 Pain in right hand: Secondary | ICD-10-CM | POA: Diagnosis not present

## 2024-01-13 DIAGNOSIS — R918 Other nonspecific abnormal finding of lung field: Secondary | ICD-10-CM | POA: Diagnosis not present

## 2024-01-13 LAB — POCT GLYCOSYLATED HEMOGLOBIN (HGB A1C): Hemoglobin A1C: 7.1 % — AB (ref 4.0–5.6)

## 2024-01-13 NOTE — Patient Instructions (Signed)
 Cut trazodone  down to 1.5 tabs (instead of 2 tabs daily)

## 2024-01-13 NOTE — Progress Notes (Unsigned)
 EP to read.

## 2024-01-13 NOTE — Progress Notes (Signed)
 Established Patient Office Visit  Subjective  Patient ID: Justin Goodman, male    DOB: 02/26/1956  Age: 68 y.o. MRN: [id number]  Chief Complaint  Patient presents with   Diabetes   Fall    HPI  Discussed the use of AI scribe software for clinical note transcription with the patient, who gave verbal consent to proceed.  History of Present Illness Justin Goodman is a 68 year old male who presents with frequent falls.  Falls and gait instability - Frequent falls, with six incidents occurring the previous day - No associated dizziness, weakness, loss of consciousness, or memory gaps - Difficulty getting up after falls due to knee issues - One episode of prolonged inability to rise after a fall, eventually able to get up after multiple attempts - No one available to call for help during falls  Head trauma and injuries - Struck head during some falls, impact described as minor - Multiple scratches and bruises attributed to falls - Right hand injury following a fall while attempting to push a table away - No current headache or abdominal pain  Tremors - Hand tremors occur when feeling down - Tremors are uncontrollable  Blood pressure and blood glucose monitoring - History of blood pressure issues, previously experienced low blood pressure, current status unknown - Daily blood glucose monitoring with no unusual readings  Associated symptoms and exclusions - No numbness in feet - No symptoms of dehydration or diarrhea - No recent medication changes      ROS    Objective:     BP 136/77   Pulse 77   Ht 5' 6 (1.676 m)   Wt 152 lb 1.3 oz (69 kg)   SpO2 100%   BMI 24.55 kg/m    Physical Exam Vitals and nursing note reviewed.  Constitutional:      Appearance: Normal appearance.  HENT:     Head: Normocephalic and atraumatic.  Eyes:     Conjunctiva/sclera: Conjunctivae normal.  Cardiovascular:     Rate and Rhythm: Normal rate and regular rhythm.   Pulmonary:     Effort: Pulmonary effort is normal.     Breath sounds: Normal breath sounds.  Musculoskeletal:     Comments: Redness on right hand at base of thumb.   Skin:    General: Skin is warm and dry.     Comments: Large scratches on his face and forearms.  Bruise on the back of the head.  No hematoma.   Neurological:     Mental Status: He is alert.  Psychiatric:        Mood and Affect: Mood normal.     Results for orders placed or performed in visit on 01/13/24  POCT HgB A1C  Result Value Ref Range   Hemoglobin A1C 7.1 (A) 4.0 - 5.6 %   HbA1c POC (<> result, manual entry)     HbA1c, POC (prediabetic range)     HbA1c, POC (controlled diabetic range)        The 10-year ASCVD risk score (Arnett DK, et al., 2019) is: 39.2%    Assessment & Plan:   Problem List Items Addressed This Visit       Cardiovascular and Mediastinum   Essential hypertension, benign   Relevant Orders   CBC with Differential/Platelet   CMP14+EGFR   Urinalysis, Routine w reflex microscopic     Respiratory   Restrictive lung disease   Relevant Orders   DG Chest 2 View   CBC with Differential/Platelet  CMP14+EGFR   Urinalysis, Routine w reflex microscopic     Endocrine   Uncontrolled diabetes mellitus with hyperglycemia (HCC)   Relevant Orders   POCT HgB A1C (Completed)   CBC with Differential/Platelet   CMP14+EGFR   Urinalysis, Routine w reflex microscopic   Other Visit Diagnoses       Encounter for immunization    -  Primary   Relevant Orders   Flu vaccine HIGH DOSE PF(Fluzone Trivalent) (Completed)     Frequent falls       Relevant Orders   DG Hand Complete Right   CBC with Differential/Platelet   CMP14+EGFR   Urinalysis, Routine w reflex microscopic   EKG 12-Lead   LONG TERM MONITOR XT (3-14 DAYS)     Hand injury, right, initial encounter       Relevant Orders   DG Hand Complete Right     Excoriation of face, initial encounter       Relevant Orders   CBC with  Differential/Platelet   CMP14+EGFR   Urinalysis, Routine w reflex microscopic        Assessment and Plan Assessment & Plan Recurrent falls with right hand contusion and multiple abrasions Recurrent falls with right hand contusion and abrasions. No dizziness, weakness, or numbness. Orthostatic hypotension not significant, but pulse increase noted. EKG normal. Possible intermittent cardiac arrhythmia. No hypoglycemia or dehydration. Bruising on head without severe injury. - Order EKG and compare with previous EKG from June 2019. - Order orthostatic blood pressure measurements. - Order laboratory tests to rule out electrolyte disturbance, dehydration, and anemia. - Order plain film x-ray of the right hand. - Order Zio cardiac monitor to assess for arrhythmias or bradycardia.  - Call with lab results once available.  Knee pain with difficulty rising from chair Knee pain when rising from a chair, contributing to mobility difficulty and possibly falls. No acute knee injury reported. Says had injections recently.    Restrictive lung disease Restrictive lung disease with coarse breath sounds. No acute respiratory distress. - Order chest x-ray to evaluate lung status.   Return in about 3 months (around 04/14/2024) for DM/BP.    Dorothyann Byars, MD

## 2024-01-14 ENCOUNTER — Other Ambulatory Visit: Payer: Self-pay | Admitting: Family Medicine

## 2024-01-14 ENCOUNTER — Ambulatory Visit: Payer: Self-pay | Admitting: Family Medicine

## 2024-01-14 DIAGNOSIS — R052 Subacute cough: Secondary | ICD-10-CM

## 2024-01-14 DIAGNOSIS — M170 Bilateral primary osteoarthritis of knee: Secondary | ICD-10-CM

## 2024-01-14 DIAGNOSIS — R748 Abnormal levels of other serum enzymes: Secondary | ICD-10-CM

## 2024-01-14 LAB — CBC WITH DIFFERENTIAL/PLATELET
Basophils Absolute: 0 x10E3/uL (ref 0.0–0.2)
Basos: 0 %
EOS (ABSOLUTE): 0 x10E3/uL (ref 0.0–0.4)
Eos: 0 %
Hematocrit: 49.7 % (ref 37.5–51.0)
Hemoglobin: 16.5 g/dL (ref 13.0–17.7)
Immature Grans (Abs): 0 x10E3/uL (ref 0.0–0.1)
Immature Granulocytes: 0 %
Lymphocytes Absolute: 0.8 x10E3/uL (ref 0.7–3.1)
Lymphs: 7 %
MCH: 27.9 pg (ref 26.6–33.0)
MCHC: 33.2 g/dL (ref 31.5–35.7)
MCV: 84 fL (ref 79–97)
Monocytes Absolute: 0.8 x10E3/uL (ref 0.1–0.9)
Monocytes: 7 %
Neutrophils Absolute: 9.5 x10E3/uL — ABNORMAL HIGH (ref 1.4–7.0)
Neutrophils: 86 %
Platelets: 198 x10E3/uL (ref 150–450)
RBC: 5.91 x10E6/uL — ABNORMAL HIGH (ref 4.14–5.80)
RDW: 13.5 % (ref 11.6–15.4)
WBC: 11.1 x10E3/uL — ABNORMAL HIGH (ref 3.4–10.8)

## 2024-01-14 LAB — CMP14+EGFR
ALT: 48 IU/L — ABNORMAL HIGH (ref 0–44)
AST: 156 IU/L — ABNORMAL HIGH (ref 0–40)
Albumin: 4.5 g/dL (ref 3.9–4.9)
Alkaline Phosphatase: 98 IU/L (ref 47–123)
BUN/Creatinine Ratio: 21 (ref 10–24)
BUN: 16 mg/dL (ref 8–27)
Bilirubin Total: 0.6 mg/dL (ref 0.0–1.2)
CO2: 20 mmol/L (ref 20–29)
Calcium: 9.9 mg/dL (ref 8.6–10.2)
Chloride: 96 mmol/L (ref 96–106)
Creatinine, Ser: 0.78 mg/dL (ref 0.76–1.27)
Globulin, Total: 3 g/dL (ref 1.5–4.5)
Potassium: 4.6 mmol/L (ref 3.5–5.2)
Sodium: 136 mmol/L (ref 134–144)
Total Protein: 7.5 g/dL (ref 6.0–8.5)
eGFR: 96 mL/min/1.73 (ref >59)

## 2024-01-14 NOTE — Progress Notes (Signed)
 Hi Jt,  Your hemoglobin is normal. No anemia but your white blood cells are up a little. Your liver enzymes are high.  Is he taking anything new? Does he drink alcohol ?  We need to get a liver US  if he is oik with that.

## 2024-01-14 NOTE — Progress Notes (Signed)
 Us   Orders Placed This Encounter  Procedures   US  ABDOMEN COMPLETE W/ELASTOGRAPHY    Standing Status:   Future    Expiration Date:   01/13/2025    Reason for Exam (SYMPTOM  OR DIAGNOSIS REQUIRED):   elevated liver enzymes    Preferred imaging location?:   [facility]

## 2024-01-15 ENCOUNTER — Telehealth: Payer: Self-pay | Admitting: Family Medicine

## 2024-01-15 NOTE — Telephone Encounter (Signed)
 Order already signed in another encounter

## 2024-01-15 NOTE — Telephone Encounter (Signed)
 Provided the patient with patient assistance telephone number to renew his form for 2026. [name]  Justin Goodman

## 2024-01-15 NOTE — Progress Notes (Signed)
 Hi Justin Goodman, your chest x-ray looks like you could have an atypical or viral infection in your chest I wonder if that is why you have not felt great and you been feeling weak.  Are you feeling any better today?  We could always have you put on the nurse schedule today and at least test you for COVID, RSV and flu.

## 2024-01-15 NOTE — Telephone Encounter (Signed)
 Please call patient and let him know that if he has not already seen it to be on the look out for a letter from a CME for his patient assistance he has to download the digital assistant and sign in or either call the one 800-number and get a form and then fax it back or happy to fax it for him if he does not have access to a fax machine but he has to do that to be able to continue with their assistance program.

## 2024-01-18 ENCOUNTER — Other Ambulatory Visit: Payer: Self-pay | Admitting: Family Medicine

## 2024-01-18 DIAGNOSIS — E1142 Type 2 diabetes mellitus with diabetic polyneuropathy: Secondary | ICD-10-CM

## 2024-01-18 NOTE — Progress Notes (Signed)
 Ok to refer to sports med in [place]. if he is ok with that for his knee since Dr. ONEIDA no longer here.

## 2024-01-18 NOTE — Progress Notes (Signed)
 HI Justin Goodman, your xray of your had shows no acute fracture.  Are you still falling over the weekend or are you feeling better?  Did he every come by for the COVID, RSV and flu testing?  Does he still have  a couygh. CXR had shows a viral infection likely

## 2024-01-21 ENCOUNTER — Institutional Professional Consult (permissible substitution) (INDEPENDENT_AMBULATORY_CARE_PROVIDER_SITE_OTHER): Admitting: Otolaryngology

## 2024-01-27 ENCOUNTER — Ambulatory Visit: Payer: Self-pay

## 2024-01-27 NOTE — Telephone Encounter (Signed)
 Spoke with patient . He states he does not feel that symptoms are worsening and he states he has not been falling more.  He declines going to urgent care but will keep appt tomorrow with Dr. Alvan

## 2024-01-27 NOTE — Telephone Encounter (Signed)
 FYI Only or Action Required?: FYI only for provider.  Patient was last seen in primary care on 01/13/2024 by Alvan Dorothyann BIRCH, MD.  Called Nurse Triage reporting Foot Swelling.  Symptoms began several days ago.  Interventions attempted: Rest, hydration, or home remedies.  Symptoms are: gradually worsening.  Triage Disposition: See Physician Within 24 Hours  Patient/caregiver understands and will follow disposition?: Yes   Copied from CRM [id number]. Topic: Clinical - Red Word Triage >> Jan 27, 2024  9:40 AM Charlet HERO wrote: Red Word that prompted transfer to Nurse Triage: Patient is calling bc his feet is swollen for 2 days he is having redness. Reason for Disposition  [1] MODERATE leg swelling (e.g., swelling extends up to knees) AND [2] new-onset or getting worse  Answer Assessment - Initial Assessment Questions 1. ONSET: When did the swelling start? (e.g., minutes, hours, days)     2 days ago 2. LOCATION: What part of the leg is swollen?  Are both legs swollen or just one leg?     Right foot 3. SEVERITY: How bad is the swelling? (e.g., localized; mild, moderate, severe)     Too swollen to put a shoe on 4. REDNESS: Is there redness or signs of infection?     Yes 5. PAIN: Is the swelling painful to touch? If Yes, ask: How painful is it?   (Scale 1-10; mild, moderate or severe)     Patient has pain when walking  6. FEVER: Do you have a fever? If Yes, ask: What is it, how was it measured, and when did it start?      No 7. CAUSE: What do you think is causing the leg swelling?     Patient states he fell and this is what caused it 9. RECURRENT SYMPTOM: Have you had leg swelling before? If Yes, ask: When was the last time? What happened that time?     Has never had this happen before  10. OTHER SYMPTOMS: Do you have any other symptoms? (e.g., chest pain, difficulty breathing)       No  Protocols used: Leg Swelling and Edema-A-AH

## 2024-01-27 NOTE — Telephone Encounter (Signed)
 I think he should be seen more urgently especially if it is getting worse and he is also been falling more recently which is concerning so I wonder if he may have actually injured it and it is not just fluid retention.  Can keep the appointment for tomorrow just in case can always reschedule it in the morning but I really would like for him to be seen at urgent care or if one of our primary is here has access today that would be wonderful.

## 2024-01-27 NOTE — Telephone Encounter (Signed)
 Patient showing scheduled for 01/28/2024 with Dr. Alvan

## 2024-01-28 ENCOUNTER — Telehealth: Payer: Self-pay

## 2024-01-28 ENCOUNTER — Ambulatory Visit: Admitting: Family Medicine

## 2024-01-28 ENCOUNTER — Ambulatory Visit
Admission: EM | Admit: 2024-01-28 | Discharge: 2024-01-28 | Disposition: A | Discharge status: Home / Self Care | Attending: Family Medicine | Admitting: Family Medicine

## 2024-01-28 ENCOUNTER — Ambulatory Visit

## 2024-01-28 ENCOUNTER — Encounter (INDEPENDENT_AMBULATORY_CARE_PROVIDER_SITE_OTHER): Payer: Self-pay

## 2024-01-28 DIAGNOSIS — R2241 Localized swelling, mass and lump, right lower limb: Secondary | ICD-10-CM | POA: Diagnosis not present

## 2024-01-28 DIAGNOSIS — M79671 Pain in right foot: Secondary | ICD-10-CM

## 2024-01-28 DIAGNOSIS — S92501A Displaced unspecified fracture of right lesser toe(s), initial encounter for closed fracture: Secondary | ICD-10-CM

## 2024-01-28 DIAGNOSIS — M19071 Primary osteoarthritis, right ankle and foot: Secondary | ICD-10-CM | POA: Diagnosis not present

## 2024-01-28 DIAGNOSIS — M7989 Other specified soft tissue disorders: Secondary | ICD-10-CM | POA: Diagnosis not present

## 2024-01-28 DIAGNOSIS — M25571 Pain in right ankle and joints of right foot: Secondary | ICD-10-CM | POA: Diagnosis not present

## 2024-01-28 MED ORDER — CELECOXIB 200 MG PO CAPS: 200.0000 mg | ORAL_CAPSULE | Freq: Every day | ORAL | 0 refills | Status: AC

## 2024-01-28 MED ORDER — OXYCODONE-ACETAMINOPHEN 5-325 MG PO TABS: 1.0000 | ORAL_TABLET | Freq: Three times a day (TID) | ORAL | 0 refills | Status: DC | PRN

## 2024-01-28 NOTE — ED Triage Notes (Signed)
 Pt c/o RT foot pain and swelling x 2 days. Says he hit his foot on a table. Hx of diabetes. Denies hx of gout.

## 2024-01-28 NOTE — Discharge Instructions (Addendum)
 Advised patient of right foot/right ankle x-ray results with hardcopy and image provided.  Advised patient to wear right postop shoe until being evaluated by [facility] health orthopedics for fracture of fifth proximal phalanx.  Advised patient to take Celebrex daily with food to completion.  Advised may use Percocet daily or as needed for acute right fifth toe pain.  Advised patient of sedate of effects of this medication.  Encouraged increase daily water intake to 64 ounces per day while taking these medications.  Advised if symptoms worsen and/or unresolved please follow-up with [facility] orth[facility] for further evaluation.

## 2024-01-28 NOTE — Progress Notes (Signed)
   01/28/2024  Patient ID: Justin Goodman, male   DOB: Mar 18, 1956, 68 y.o.   MRN: [id number]  Missed call/voicemail from patient in regard to medication need yesterday.  Attempted to call patient back today, but I was not able to reach him.  HIPAA compliant voicemail was left with my direct phone number.  [name], PharmD, DPLA

## 2024-01-28 NOTE — ED Provider Notes (Signed)
 Justin Goodman [facility]    CSN: [id number] Arrival date & time: 01/28/24  9078      History   Chief Complaint Chief Complaint  Patient presents with   Foot Pain    RT, and swelling    HPI Justin Goodman is a 68 y.o. male.   HPI Pleasant 68 year old male presents with right foot pain and swelling for 2 days secondary to hitting f.  PMH significant for chronic back pain, HTN, current everyday cigarette smoker, and T2DM with neuropathy.  Past Medical History:  Diagnosis Date   Chronic back pain    only mild loss of T12-L1 disk ht on xray 1-12   COPD (chronic obstructive pulmonary disease) (HCC)    Diabetes mellitus    w/ neuropathy   Hypertension    Smoker     Patient Active Problem List   Diagnosis Date Noted   Memory impairment 07/14/2023   Diabetic retinopathy (HCC) 04/29/2023   Comminuted fourth proximal phalangeal fracture, comminuted third distal phalangeal fracture, left 01/20/2023   Lumbar stenosis with neurogenic claudication 09/25/2017   Primary osteoarthritis of both knees 03/10/2017   Aortic atherosclerosis 08/13/2016   Chronic constipation 10/27/2014   Right shoulder pain 09/11/2014   Combined forms of age-related cataract of left eye 05/25/2014   Asthma with COPD (chronic obstructive pulmonary disease) (HCC) 09/16/2012   Restrictive lung disease 08/19/2012   AR (allergic rhinitis) 07/21/2012   Former smoker 07/21/2012   Erectile dysfunction associated with type 2 diabetes mellitus (HCC) 03/15/2012   Anomalous aortic origin of coronary artery 09/12/2010   Proteinuria 08/06/2010   Insomnia 05/14/2010   Spinal stenosis of lumbar region 05/01/2010   Uncontrolled diabetes mellitus with hyperglycemia (HCC) 04/22/2010   Hyperlipidemia 04/22/2010   Diabetic peripheral neuropathy (HCC) 04/22/2010   Essential hypertension, benign 04/22/2010    Past Surgical History:  Procedure Laterality Date   LUMBAR LAMINECTOMY/DECOMPRESSION MICRODISCECTOMY N/A  09/25/2017   Procedure: LAMINECTOMY LUMBAR FOUR- LUMBAR FIVE;  Surgeon: Justin Duncans, MD;  Location: [facility] OR;  Service: Neurosurgery;  Laterality: N/A;   UVULOPALATOPHARYNGOPLASTY     2001       Home Medications    Prior to Admission medications   Medication Sig Start Date End Date Taking? Authorizing Provider  celecoxib (CELEBREX) 200 MG capsule Take 1 capsule (200 mg total) by mouth daily for 15 days. 01/28/24 02/12/24 Yes Teddy Ozell, FNP  oxyCODONE -acetaminophen  (PERCOCET/ROXICET) 5-325 MG tablet Take 1 tablet by mouth every 8 (eight) hours as needed for severe pain (pain score 7-10). 01/28/24  Yes Teddy Ozell, FNP  Accu-Chek FastClix Lancets MISC Check blood glucose 4 times daily Diagnosis diabetes E11.40 06/23/22   Alvan Dorothyann BIRCH, MD  albuterol  (VENTOLIN  HFA) 108 (90 Base) MCG/ACT inhaler Inhale 2 puffs into the lungs every 6 (six) hours as needed for wheezing or shortness of breath. 04/30/23   Alvan Dorothyann BIRCH, MD  Alcohol  Swabs  (DROPSAFE ALCOHOL  PREP) 70 % PADS USE FOUR TIMES DAILY WHEN CHECKING BLOOD GLUCOSE 12/14/23   Alvan Dorothyann BIRCH, MD  azelastine  (ASTELIN ) 0.1 % nasal spray Place 2 sprays into both nostrils 2 (two) times daily. Use in each nostril as directed 11/24/23   Alvan Dorothyann BIRCH, MD  blood glucose meter kit and supplies Dispense based on patient and insurance preference. Use up to four times daily as directed. Dx: E11.9 03/27/21   Alvan Dorothyann BIRCH, MD  Blood Glucose Monitoring Suppl (ACCU-CHEK GUIDE) w/Device KIT Use up to 4 times daily. Dx: E11.40 06/23/22   Alvan Dorothyann  D, MD  dapagliflozin  propanediol (FARXIGA ) 5 MG TABS tablet Take 1 tablet (5 mg total) by mouth daily. 11/29/23   Alvan Dorothyann BIRCH, MD  gabapentin  (NEURONTIN ) 600 MG tablet TAKE 2 TABLETS (1,200 MG TOTAL) BY MOUTH 3 (THREE) TIMES DAILY. 10/22/23   Alvan Dorothyann BIRCH, MD  glucose blood (ACCU-CHEK GUIDE) test strip Use up to 4 times daily. Dx: E11.40 06/23/22   Alvan Dorothyann BIRCH, MD  guaiFENesin  (MUCINEX ) 600 MG 12 hr tablet Take 2 tablets (1,200 mg total) by mouth 2 (two) times daily as needed. 10/22/23   Alvan Dorothyann BIRCH, MD  insulin  degludec (TRESIBA  FLEXTOUCH) 200 UNIT/ML FlexTouch Pen Inject 36 Units into the skin daily. Do not [employer], bill to [employer] Patient Assistance Program BIN: [id number] PCNBETHA BLOTTER GRP: [id number] ID: [id number] Patient taking differently: Inject 32 Units into the skin daily. Do not bill insurance, bill to [employer] Patient Assistance Program BIN: [id number] PCNBETHA BLOTTER GRP: [id number] ID: [id number] 07/17/23   Alvan Dorothyann BIRCH, MD  Insulin  Pen Needle (DROPLET PEN NEEDLES) 31G X 5 MM MISC USE FOR DAILY INSULIN  INJECTION AS DIRECTED BY PHYSICIAN (DISCONTINUE 32G X PEN NEEDLES) 01/18/24   Alvan Dorothyann BIRCH, MD  losartan  (COZAAR ) 25 MG tablet Take 0.5 tablets (12.5 mg total) by mouth daily. 11/06/23   Alvan Dorothyann BIRCH, MD  metFORMIN  (GLUCOPHAGE -XR) 500 MG 24 hr tablet Take 2 tablets (1,000 mg total) by mouth 2 (two) times daily with a meal. 10/22/23   Alvan Dorothyann BIRCH, MD  metoprolol  succinate (TOPROL -XL) 25 MG 24 hr tablet TAKE 1 TABLET (25 MG TOTAL) BY MOUTH DAILY. 11/10/23   Alvan Dorothyann BIRCH, MD  pantoprazole  (PROTONIX ) 40 MG tablet TAKE 1 TABLET (40 MG TOTAL) BY MOUTH TWICE A DAY BEFORE MEALS 01/18/24   Alvan Dorothyann BIRCH, MD  polyethylene glycol (MIRALAX  / GLYCOLAX ) 17 g packet Take 17 g by mouth 2 (two) times daily. Until stooling regularly 04/02/23   Willo Mini, NP  pravastatin  (PRAVACHOL ) 20 MG tablet TAKE 1 TABLET BY MOUTH TWICE PER WEEK AT BEDTIME. 10/22/23   Alvan Dorothyann BIRCH, MD  senna-docusate (SENOKOT-S) 8.6-50 MG tablet Take 1 tablet by mouth at bedtime as needed for mild constipation. 09/23/23   Alvan Dorothyann BIRCH, MD  tiZANidine  (ZANAFLEX ) 4 MG tablet TAKE 1 TABLET BY MOUTH TWICE A DAY AS NEEDED FOR MUSCLE SPASMS 10/22/23   Alvan Dorothyann BIRCH, MD  traZODone  (DESYREL ) 100 MG tablet Take 2 tablets  (200 mg total) by mouth at bedtime. Patient taking differently: Take 100 mg by mouth at bedtime. 10/22/23   Alvan Dorothyann BIRCH, MD    Family History Family History  Problem Relation Age of Onset   Diabetes Mother    Alcohol  abuse Father        Liver disease killed father @ age 75.   Alcohol  abuse Brother    Heart attack Brother        Died at age 61.    Social History Social History   Tobacco Use   Smoking status: Former    Current packs/day: 0.00    Average packs/day: 1 pack/day for 20.0 years (20.0 ttl pk-yrs)    Types: Cigarettes    Start date: 13    Quit date: 2013    Years since quitting: 12.8   Smokeless tobacco: Never  Vaping Use   Vaping status: Never Used  Substance Use Topics   Alcohol  use: No   Drug use: No     Allergies   Amaryl  [glimepiride ], Amitriptyline ,  Atorvastatin , Cymbalta  [duloxetine  hcl], Glipizide , Losartan , Lyrica  [pregabalin ], Quinapril , and Simvastatin    Review of Systems Review of Systems  Musculoskeletal:        Right foot swelling for days  All other systems reviewed and are negative.    Physical Exam Triage Vital Signs ED Triage Vitals  Encounter Vitals Group     BP      Girls Systolic BP Percentile      Girls Diastolic BP Percentile      Boys Systolic BP Percentile      Boys Diastolic BP Percentile      Pulse      Resp      Temp      Temp src      SpO2      Weight      Height      Head Circumference      Peak Flow      Pain Score      Pain Loc      Pain Education      Exclude from Growth Chart    No data found.  Updated Vital Signs BP 131/77 (BP Location: Right Arm)   Pulse (!) 105   Temp 98.1 F (36.7 C) (Oral)   Resp 17   SpO2 98%    Physical Exam Vitals and nursing note reviewed.  Constitutional:      Appearance: Normal appearance. He is normal weight.  HENT:     Head: Normocephalic and atraumatic.     Mouth/Throat:     Mouth: Mucous membranes are moist.     Pharynx: Oropharynx is clear.   Eyes:     Extraocular Movements: Extraocular movements intact.     Pupils: Pupils are equal, round, and reactive to light.  Cardiovascular:     Rate and Rhythm: Normal rate and regular rhythm.     Pulses: Normal pulses.     Heart sounds: Normal heart sounds.  Pulmonary:     Effort: Pulmonary effort is normal.     Breath sounds: Normal breath sounds. No wheezing, rhonchi or rales.  Musculoskeletal:        General: Normal range of motion.     Comments: Right ankle/right foot (lateral aspect over malleolus/dorsum of right foot): TTP, with moderate soft tissue swelling, limited range of motion with flexion/extension patient reports pain with walking/weightbearing  Skin:    General: Skin is warm and dry.  Neurological:     General: No focal deficit present.     Mental Status: He is alert and oriented to person, place, and time. Mental status is at baseline.  Psychiatric:        Mood and Affect: Mood normal.        Behavior: Behavior normal.      UC Treatments / Results  Labs (all labs ordered are listed, but only abnormal results are displayed) Labs Reviewed - No data to display  EKG   Radiology DG Ankle Complete Right Result Date: 01/28/2024 CLINICAL DATA:  Fall 2 days ago with right ankle pain. EXAM: RIGHT ANKLE - COMPLETE 3+ VIEW COMPARISON:  None Available. FINDINGS: Minimal diffuse soft tissue swelling over the ankle. Ankle mortise is normal. No acute fracture or dislocation. IMPRESSION: No acute fracture. Electronically Signed   By: Toribio Agreste M.D.   On: 01/28/2024 10:37   DG Foot Complete Right Result Date: 01/28/2024 CLINICAL DATA:  Right foot pain and swelling after fall 2 days ago. EXAM: RIGHT FOOT COMPLETE - 3+ VIEW COMPARISON:  None Available. FINDINGS: Mild irregularity of the base of the fifth proximal phalanx as cannot exclude a subtle fracture in this location. Minimal degenerate change over the first MTP joint. Small fragment adjacent the dorsal base of a  distal phalanx seen only on the lateral film likely the second or third distal phalanx as this may represent a chronic or acute chip fracture. Remainder of the exam is unremarkable. IMPRESSION: 1. Mild irregularity of the base of the fifth proximal phalanx as cannot exclude a subtle fracture in this location. 2. Small fragment adjacent the dorsal base of a distal phalanx seen only on the lateral film likely the second or third distal phalanx as this may represent a chronic or acute chip fracture. Electronically Signed   By: Toribio Agreste M.D.   On: 01/28/2024 10:36    Procedures Procedures (including critical care time)  Medications Ordered in UC Medications - No data to display  Initial Impression / Assessment and Plan / UC Course  I have reviewed the triage vital signs and the nursing notes.  Pertinent labs & imaging results that were available during my care of the patient were reviewed by me and considered in my medical decision making (see chart for details).     MDM: 1.  Closed fracture of phalanx of right fifth toe, initial-right foot x-ray results revealed above, patient advised.  Rx'd Celebrex 200 mg capsule: Take 1 capsule daily x 15 days, Rx'd Percocet 5/325 mg tablet: Take 1 tablet every 8 hours for severe pain. 2.  Foot pain, right-right foot x-ray results revealed above, patient advised; 3.  Localized swelling of right foot-right foot/right right ankle x-ray results revealed above, patient advised.  Size 9.5 postop shoe placed on right foot prior to discharge with specific instructions to follow-up with [place] orth[facility]ics for fracture management. Advised patient of right foot/right ankle x-ray results with hardcopy and image provided.  Advised patient to wear right postop shoe until being evaluated by [facility] orthopedics for fracture of fifth proximal phalanx.  Advised patient to take Celebrex daily with food to completion.  Advised may use Percocet daily or as needed for acute  right fifth toe pain.  Advised patient of sedate of effects of this medication.  Encouraged increase daily water intake to 64 ounces per day while taking these medications.  Advised if symptoms worsen and/or unresolved please follow-up with [facility] for further evaluation. Final Clinical Impressions(s) / UC Diagnoses   Final diagnoses:  Localized swelling of right foot  Foot pain, right  Closed fracture of phalanx of right fifth toe, initial encounter     Discharge Instructions      Advised patient of right foot/right ankle x-ray results with hardcopy and image provided.  Advised patient to wear right postop shoe until being evaluated by [facility] orthopedics for fracture of fifth proximal phalanx.  Advised patient to take Celebrex daily with food to completion.  Advised may use Percocet daily or as needed for acute right fifth toe pain.  Advised patient of sedate of effects of this medication.  Encouraged increase daily water intake to 64 ounces per day while taking these medications.  Advised if symptoms worsen and/or unresolved please follow-up with [facility] orth[facility] for further evaluation.     ED Prescriptions     Medication Sig Dispense Auth. Provider   celecoxib (CELEBREX) 200 MG capsule Take 1 capsule (200 mg total) by mouth daily for 15 days. 15 capsule Laruth Hanger, FNP   oxyCODONE -acetaminophen  (  PERCOCET/ROXICET) 5-325 MG tablet Take 1 tablet by mouth every 8 (eight) hours as needed for severe pain (pain score 7-10). 15 tablet Arhianna Ebey, FNP      I have reviewed the PDMP during this encounter.   Deaundre, [place], FNP 01/28/24 1100

## 2024-01-28 NOTE — Telephone Encounter (Signed)
 Received from patient assistance Tresiba  U200 two boxes ( 3 pens per box)  Left a voice ail message for patient tor return our call  Patient has an upcoming appt in office today at 2:40pm so he can pick these up at that visit.

## 2024-01-29 DIAGNOSIS — I10 Essential (primary) hypertension: Secondary | ICD-10-CM | POA: Diagnosis not present

## 2024-01-29 DIAGNOSIS — E1165 Type 2 diabetes mellitus with hyperglycemia: Secondary | ICD-10-CM | POA: Diagnosis not present

## 2024-01-29 DIAGNOSIS — I499 Cardiac arrhythmia, unspecified: Secondary | ICD-10-CM | POA: Diagnosis not present

## 2024-01-29 DIAGNOSIS — E1142 Type 2 diabetes mellitus with diabetic polyneuropathy: Secondary | ICD-10-CM | POA: Diagnosis not present

## 2024-01-29 DIAGNOSIS — F329 Major depressive disorder, single episode, unspecified: Secondary | ICD-10-CM | POA: Diagnosis not present

## 2024-01-29 DIAGNOSIS — Z7984 Long term (current) use of oral hypoglycemic drugs: Secondary | ICD-10-CM | POA: Diagnosis not present

## 2024-01-29 DIAGNOSIS — M4800 Spinal stenosis, site unspecified: Secondary | ICD-10-CM | POA: Diagnosis not present

## 2024-01-29 DIAGNOSIS — I700 Atherosclerosis of aorta: Secondary | ICD-10-CM | POA: Diagnosis not present

## 2024-01-29 DIAGNOSIS — Z9181 History of falling: Secondary | ICD-10-CM | POA: Diagnosis not present

## 2024-01-29 DIAGNOSIS — I2510 Atherosclerotic heart disease of native coronary artery without angina pectoris: Secondary | ICD-10-CM | POA: Diagnosis not present

## 2024-01-29 DIAGNOSIS — E785 Hyperlipidemia, unspecified: Secondary | ICD-10-CM | POA: Diagnosis not present

## 2024-01-29 DIAGNOSIS — J470 Bronchiectasis with acute lower respiratory infection: Secondary | ICD-10-CM | POA: Diagnosis not present

## 2024-01-29 DIAGNOSIS — L97901 Non-pressure chronic ulcer of unspecified part of unspecified lower leg limited to breakdown of skin: Secondary | ICD-10-CM | POA: Diagnosis not present

## 2024-01-29 DIAGNOSIS — K219 Gastro-esophageal reflux disease without esophagitis: Secondary | ICD-10-CM | POA: Diagnosis not present

## 2024-01-29 DIAGNOSIS — E1151 Type 2 diabetes mellitus with diabetic peripheral angiopathy without gangrene: Secondary | ICD-10-CM | POA: Diagnosis not present

## 2024-01-29 DIAGNOSIS — M1990 Unspecified osteoarthritis, unspecified site: Secondary | ICD-10-CM | POA: Diagnosis not present

## 2024-01-29 DIAGNOSIS — Z791 Long term (current) use of non-steroidal anti-inflammatories (NSAID): Secondary | ICD-10-CM | POA: Diagnosis not present

## 2024-01-29 DIAGNOSIS — M2040 Other hammer toe(s) (acquired), unspecified foot: Secondary | ICD-10-CM | POA: Diagnosis not present

## 2024-01-29 NOTE — Telephone Encounter (Signed)
 PAP: Application for Marcelline Deist has been submitted to [employer] ([employer]), via fax

## 2024-02-05 ENCOUNTER — Telehealth: Payer: Self-pay

## 2024-02-05 NOTE — Progress Notes (Signed)
   02/05/2024  Patient ID: Justin Goodman, male   DOB: 10-22-1955, 69 y.o.   MRN: [id number]  Returning missed call/voicemail from patient in regard to Tresiba  he receives from [employer].  Patient states [employer] informed him he would no longer be able to receive Tresiba  through the program at no cost.  My understanding is the generic form will still be available to Medicare patients, which he is fine changing to.  Coordinating with the medication assistance team to verify and work on 2026 re-enrollment if eligible.  [name], PharmD, DPLA

## 2024-02-10 ENCOUNTER — Telehealth: Payer: Self-pay

## 2024-02-10 NOTE — Telephone Encounter (Signed)
 PAP: Patient assistance application for Tresiba  through [employer] has been mailed to pt's home address on file. Provider portion of application will be faxed to provider's office. E-filed patient portion.

## 2024-02-12 ENCOUNTER — Telehealth: Payer: Self-pay | Admitting: Family Medicine

## 2024-02-12 NOTE — Telephone Encounter (Signed)
 Copied from CRM [id number]. Topic: General - Other >> Feb 12, 2024 10:06 AM Mercer PEDLAR wrote: Reason for CRM: Clemencia calling from [facility] patient assistance program regarding patient's application stating that they still needs the provider's portion completed which is pages 7, 8, and 9.   Callback: [phone] Fax: [phone]

## 2024-02-12 NOTE — Telephone Encounter (Signed)
 This was faxed to [employer] per directions on form. Confirmation received and placed in folder to be scanned.

## 2024-02-12 NOTE — Telephone Encounter (Signed)
 PAP: Application for Justin Goodman has been submitted to [employer], via fax

## 2024-02-16 ENCOUNTER — Telehealth: Payer: Self-pay

## 2024-02-16 DIAGNOSIS — M170 Bilateral primary osteoarthritis of knee: Secondary | ICD-10-CM

## 2024-02-16 NOTE — Telephone Encounter (Signed)
 Copied from CRM [id number]. Topic: Clinical - Medication Question >> Feb 16, 2024  9:18 AM Justin Goodman wrote: Reason for CRM: Pt was calling for the status on an injection for both knees. Stated he is in pain from a fall last Friday. I asked if he would like to speak to nurse and declined that they can't do anything. CB  [phone] (M)

## 2024-02-16 NOTE — Telephone Encounter (Signed)
 Spoke with patient and he is requesting a referral to orthopaedic in [place] instead of [place].  He states he did schedule the appt with [place] but would like to be seen at a [place] location instead.  Requesting a call back regarding this.   Is it possible to use the current referral that was sent originally for this or would you need a complete new referral be sent ?

## 2024-02-16 NOTE — Telephone Encounter (Signed)
 OK to cancel sports med appt and place ref for ortho in [facility]

## 2024-02-16 NOTE — Telephone Encounter (Signed)
 Copied from CRM [id number]. Topic: Clinical - Red Word Triage >> Feb 16, 2024  9:15 AM Olam RAMAN wrote: Red Word that prompted transfer to Nurse Triage: Pt was calling for the status on an injection for both knees. Pt stated he fell a few time on Friday. PT REFUSED NT

## 2024-02-17 NOTE — Addendum Note (Signed)
 Addended by: BONNY JON DEL on: 02/17/2024 01:40 PM   Modules accepted: Orders

## 2024-02-17 NOTE — Telephone Encounter (Signed)
 Submitted referral to ortho. Cancelled appointment with sports medicine.

## 2024-02-18 ENCOUNTER — Telehealth: Payer: Self-pay

## 2024-02-18 ENCOUNTER — Ambulatory Visit

## 2024-02-18 NOTE — Progress Notes (Signed)
   02/18/2024  Patient ID: Justin Goodman, male   DOB: 14-May-1955, 68 y.o.   MRN: [id number]  Missed call/voicemail from patient inquiring about [employer] 2026 re-enrollment application for Tresiba  (or generic).  Application has been faxed to the company, but they will not start processing these applications until on/after 11/15.  Attempted to return patient's call x2, but phone would ring a few times and then disconnect.  I will check on processing status of his re-enrollment next week and follow-up with him  [name], PharmD, DPLA

## 2024-02-19 ENCOUNTER — Telehealth: Payer: Self-pay

## 2024-02-19 NOTE — Telephone Encounter (Signed)
 Hello Channing,  Is this meant as a return call to you regarding the Tresiba  note?

## 2024-02-19 NOTE — Telephone Encounter (Signed)
 Copied from CRM #[id number]. Topic: Referral - Status >> Feb 19, 2024  9:26 AM Delon HERO wrote: Reason for CRM: Patient is calling to check on the status of referral for  ORTHOPEDIC SURGERY. Patient states that he checked with his insurance. And it has been approved.

## 2024-02-19 NOTE — Telephone Encounter (Signed)
 Copied from CRM #[id number]. Topic: Clinical - Medication Question >> Feb 19, 2024  9:24 AM Delon HERO wrote: Reason for CRM: Patient is calling to request his insulin  that is to be sent to the office. Please advise

## 2024-02-19 NOTE — Telephone Encounter (Signed)
 Sent patient a Mychart message with referral information including facility contact #.

## 2024-02-22 ENCOUNTER — Telehealth: Payer: Self-pay

## 2024-02-22 NOTE — Telephone Encounter (Signed)
 Attempted call to patient. Left a voice mail message requesting a return call.

## 2024-02-22 NOTE — Progress Notes (Signed)
   02/22/2024  Patient ID: Justin Goodman, male   DOB: [birth year], 68 y.o.   MRN: [id number]  Returning missed call/voicemail from patient stating he received a letter from [employer] stating they were missing HCP information for his 2026 re-enrollment.  Provider portion was faxed in earlier this month, but the company would not start processing applications until 11/15 or after.  Informed the patient we should hear something in the next week or so in regard to 2026 re-enrollment.  [name], PharmD, DPLA

## 2024-02-26 NOTE — Telephone Encounter (Signed)
 Please see note in separate message from cheryl gentry regarding this -  she has left message regarding patient asst form .

## 2024-02-29 DIAGNOSIS — G8929 Other chronic pain: Secondary | ICD-10-CM | POA: Diagnosis not present

## 2024-02-29 DIAGNOSIS — M1711 Unilateral primary osteoarthritis, right knee: Secondary | ICD-10-CM | POA: Diagnosis not present

## 2024-02-29 DIAGNOSIS — M25561 Pain in right knee: Secondary | ICD-10-CM | POA: Diagnosis not present

## 2024-02-29 DIAGNOSIS — M25562 Pain in left knee: Secondary | ICD-10-CM | POA: Diagnosis not present

## 2024-03-04 NOTE — Telephone Encounter (Signed)
 PAP: Patient assistance application for Tresiba  has been approved by PAP Companies: [employer] from 04/07/2024 to 04/06/2025. Medication should be delivered to PAP Delivery: Provider's office. For further shipping updates, please contact [employer] at [phone]. Patient ID is: [id number]

## 2024-03-07 ENCOUNTER — Ambulatory Visit: Payer: Self-pay | Admitting: Neurology

## 2024-03-08 ENCOUNTER — Telehealth: Payer: Self-pay

## 2024-03-08 DIAGNOSIS — S91001A Unspecified open wound, right ankle, initial encounter: Secondary | ICD-10-CM | POA: Diagnosis not present

## 2024-03-08 DIAGNOSIS — S92514A Nondisplaced fracture of proximal phalanx of right lesser toe(s), initial encounter for closed fracture: Secondary | ICD-10-CM | POA: Diagnosis not present

## 2024-03-08 DIAGNOSIS — M25471 Effusion, right ankle: Secondary | ICD-10-CM | POA: Diagnosis not present

## 2024-03-08 NOTE — Progress Notes (Signed)
   03/08/2024  Patient ID: Justin Goodman, male   DOB: Jul 19, 1955, 68 y.o.   MRN: [id number]  Contacted patient to make him aware he has been approved to continue to receive insulin  degludec through [employer] for 2026.  Medication will go into processing 1/2 and should arrive at [facility] around mid to late January.  A four month supply was last sent on 9/26, so he should have enough medication to last until this delivery arrives.  [name], PharmD, DPLA

## 2024-03-15 ENCOUNTER — Telehealth: Payer: Self-pay

## 2024-03-15 NOTE — Progress Notes (Signed)
   03/15/2024  Patient ID: Justin Goodman, male   DOB: [birth year], 68 y.o.   MRN: [id number]  This patient is appearing on a report for being at risk of failing the adherence measure for hypertension (ACEi/ARB) medications this calendar year.   Medication: losartan  25mg  daily Last fill date: 03/11/24 for 90 day supply  Insurance report was not up to date. No action needed at this time.   [name], PharmD, DPLA

## 2024-03-28 ENCOUNTER — Other Ambulatory Visit: Payer: Self-pay | Admitting: Family Medicine

## 2024-04-13 NOTE — Progress Notes (Signed)
 "  Established Patient Office Visit  Patient ID: Justin Goodman, male    DOB: 1955-10-05  Age: 69 y.o. MRN: [id number] PCP: Alvan Dorothyann BIRCH, MD  Chief Complaint  Patient presents with   Diabetes    Subjective:     HPI  Discussed the use of AI scribe software for clinical note transcription with the patient, who gave verbal consent to proceed.  History of Present Illness Justin Goodman is a 69 year old male who presents with persistent foot pain and swelling following a fall.  Foot pain and swelling - Persistent pain and swelling in the foot following a fall - Initial evaluation at urgent care included x-rays - Referred to orthopedist, who identified a wound and referred to wound care - Pain is severe and swelling has not improved  Diabetes mellitus - Recent hemoglobin A1c of 6.9% - Actively working to improve glycemic control - No recent episodes of hyperglycemia or hypoglycemia  Urinary urgency - Experiences sudden, intense urge to urinate, particularly with activity - No dysuria or other abnormal urinary symptoms  Cognitive impairment - Difficulty with short-term memory, including forgetting recent activities - Difficulty recalling events from ten years ago - Manages bill payments by setting reminders and paying all at once to avoid forgetting  Recent influenza-like illness - Recent episode of influenza prior to last visit - Received sinus medication without improvement - Received influenza vaccination   Since he was last here he did go to urgent care on October 23 for right foot pain and swelling and difficulty walking on that foot after a fall.  Evaluation revealed a closed fracture of the phalanx of the right fifth toe.  He was given Celebrex  and Percocet.  Placed in postop shoe.  He was then referred to orthopedics for follow-up he has been seeing Lonni Lout at [facility] for the injury as well as chronic bilateral knee pain and osteoarthritis.   Upon evaluation in December he was noted to have a right ankle wound and was then referred for wound care.  Prior evaluation included vascular surgery consultation June 19, 2022 of his right lower extremity for ischemic rest pain.  He had severely decreased ABI on the right.  Patient was referred for CTA with runoff but patient never completed the study.  For a brief period he followed with podiatry but then canceled his appointments.  Does continue to smoke.    ROS    Objective:     BP (!) 123/55   Pulse 77   Ht 5' 6 (1.676 m)   Wt 152 lb (68.9 kg)   SpO2 97%   BMI 24.53 kg/m    Physical Exam Vitals and nursing note reviewed.  Constitutional:      Appearance: Normal appearance.  HENT:     Head: Normocephalic and atraumatic.  Eyes:     Conjunctiva/sclera: Conjunctivae normal.  Cardiovascular:     Rate and Rhythm: Normal rate and regular rhythm.  Pulmonary:     Effort: Pulmonary effort is normal.     Breath sounds: Normal breath sounds.  Skin:    General: Skin is warm and dry.  Neurological:     Mental Status: He is alert.  Psychiatric:        Mood and Affect: Mood normal.      Results for orders placed or performed in visit on 04/14/24  POCT HgB A1C  Result Value Ref Range   Hemoglobin A1C 6.9 (A) 4.0 - 5.6 %   HbA1c POC (<>  result, manual entry)     HbA1c, POC (prediabetic range)     HbA1c, POC (controlled diabetic range)        The 10-year ASCVD risk score (Arnett DK, et al., 2019) is: 34%    Assessment & Plan:   Problem List Items Addressed This Visit       Cardiovascular and Mediastinum   PVD (peripheral vascular disease) with claudication   Working with wound care and Podiatry.  Continue pravastatin       Essential hypertension, benign   BP at goal today.        Relevant Orders   Lipid panel     Respiratory   Asthma with COPD (chronic obstructive pulmonary disease) (HCC)   No recent flares.  No wheezing in exam today.           Endocrine   Uncontrolled diabetes mellitus with hyperglycemia (HCC)   Relevant Orders   POCT HgB A1C (Completed)   Diabetic retinopathy (HCC)   Last eye exam is up to date.        Diabetic peripheral neuropathy (HCC)   Relevant Medications   traZODone  (DESYREL ) 100 MG tablet   donepezil  (ARICEPT ) 5 MG tablet   Other Relevant Orders   Lipid panel     Nervous and Auditory   Mild dementia (HCC)   MOCA score of 10 today, down from 14.   English not first language, did complete HS.        Relevant Medications   traZODone  (DESYREL ) 100 MG tablet   donepezil  (ARICEPT ) 5 MG tablet     Genitourinary   OAB (overactive bladder)   Relevant Medications   solifenacin  (VESICARE ) 10 MG tablet     Other   Insomnia   Relevant Medications   traZODone  (DESYREL ) 100 MG tablet   Other Visit Diagnoses       Elevated liver function tests    -  Primary   Relevant Orders   CMP14+EGFR   Lipid panel     Encounter for immunization       Relevant Orders   Flu vaccine HIGH DOSE PF(Fluzone Trivalent) (Completed)       Assessment and Plan Assessment & Plan Type 2 diabetes mellitus Well-controlled with A1c of 6.9. No recent hyperglycemia or hypoglycemia. Insulin  Tresiba  approved and administered. - Continue current diabetes management plan. - Ensure good glycemic control to aid in wound healing. - Tresiba  covered with PAP.  Overactive bladder Experiencing urinary urgency, possible bladder spasms considered. - Sent 30-day supply of VESIcare  to local pharmacy for trial. - Consider urology referral if VESIcare  not helpful, though he may not pursue due to copay.  Mild dementia Memory issues with short-term recall. MOCA score 10/30, down from 14. - Started Aricept  5 mg, potential increase to 10 mg if well tolerated. - Conducted MOCA to assess memory function and track changes.  Elevated liver function tests Liver enzymes significantly elevated in October. Ultrasound ordered but not  pursued. - Rechecked liver enzymes today to assess for improvement.  General health maintenance Lung cancer screening with CT last done in 2024. No recent chest pain or shortness of breath. Received flu shot. - Checked records to confirm last lung cancer screening date.    Return in about 3 months (around 07/13/2024) for Diabetes follow-up.    Dorothyann Byars, MD [facility]   "

## 2024-04-14 ENCOUNTER — Ambulatory Visit (INDEPENDENT_AMBULATORY_CARE_PROVIDER_SITE_OTHER): Admitting: Family Medicine

## 2024-04-14 ENCOUNTER — Encounter: Payer: Self-pay | Admitting: Family Medicine

## 2024-04-14 VITALS — BP 123/55 | HR 77 | Ht 66.0 in | Wt 152.0 lb

## 2024-04-14 DIAGNOSIS — R7989 Other specified abnormal findings of blood chemistry: Secondary | ICD-10-CM | POA: Diagnosis not present

## 2024-04-14 DIAGNOSIS — I739 Peripheral vascular disease, unspecified: Secondary | ICD-10-CM | POA: Insufficient documentation

## 2024-04-14 DIAGNOSIS — E113293 Type 2 diabetes mellitus with mild nonproliferative diabetic retinopathy without macular edema, bilateral: Secondary | ICD-10-CM | POA: Diagnosis not present

## 2024-04-14 DIAGNOSIS — E1165 Type 2 diabetes mellitus with hyperglycemia: Secondary | ICD-10-CM

## 2024-04-14 DIAGNOSIS — F03A0 Unspecified dementia, mild, without behavioral disturbance, psychotic disturbance, mood disturbance, and anxiety: Secondary | ICD-10-CM

## 2024-04-14 DIAGNOSIS — Z7984 Long term (current) use of oral hypoglycemic drugs: Secondary | ICD-10-CM

## 2024-04-14 DIAGNOSIS — Z23 Encounter for immunization: Secondary | ICD-10-CM | POA: Diagnosis not present

## 2024-04-14 DIAGNOSIS — J4489 Other specified chronic obstructive pulmonary disease: Secondary | ICD-10-CM

## 2024-04-14 DIAGNOSIS — F5101 Primary insomnia: Secondary | ICD-10-CM | POA: Diagnosis not present

## 2024-04-14 DIAGNOSIS — N3281 Overactive bladder: Secondary | ICD-10-CM | POA: Diagnosis not present

## 2024-04-14 DIAGNOSIS — I10 Essential (primary) hypertension: Secondary | ICD-10-CM | POA: Diagnosis not present

## 2024-04-14 DIAGNOSIS — E1142 Type 2 diabetes mellitus with diabetic polyneuropathy: Secondary | ICD-10-CM | POA: Diagnosis not present

## 2024-04-14 LAB — POCT URINALYSIS DIP (CLINITEK)
Bilirubin, UA: NEGATIVE
Blood, UA: NEGATIVE
Glucose, UA: 500 mg/dL — AB
Ketones, POC UA: NEGATIVE mg/dL
Leukocytes, UA: NEGATIVE
Nitrite, UA: NEGATIVE
POC PROTEIN,UA: 100 — AB
Spec Grav, UA: 1.02
Urobilinogen, UA: 0.2 U/dL
pH, UA: 7

## 2024-04-14 LAB — POCT GLYCOSYLATED HEMOGLOBIN (HGB A1C): Hemoglobin A1C: 6.9 % — AB (ref 4.0–5.6)

## 2024-04-14 MED ORDER — DONEPEZIL HCL 5 MG PO TABS: 5.0000 mg | ORAL_TABLET | Freq: Every day | ORAL | 1 refills | Status: AC

## 2024-04-14 MED ORDER — TRAZODONE HCL 100 MG PO TABS: 150.0000 mg | ORAL_TABLET | Freq: Every day | ORAL | 1 refills | Status: AC

## 2024-04-14 MED ORDER — SOLIFENACIN SUCCINATE 10 MG PO TABS: 10.0000 mg | ORAL_TABLET | Freq: Every day | ORAL | 1 refills | Status: AC

## 2024-04-14 NOTE — Assessment & Plan Note (Signed)
 Working with wound care and Podiatry.  Continue pravastatin 

## 2024-04-14 NOTE — Assessment & Plan Note (Signed)
 MOCA score of 10 today, down from 14.   English not first language, did complete HS.

## 2024-04-14 NOTE — Assessment & Plan Note (Signed)
 Last eye exam is up to date.

## 2024-04-14 NOTE — Assessment & Plan Note (Signed)
 No recent flares.  No wheezing in exam today.

## 2024-04-14 NOTE — Addendum Note (Signed)
 Addended by: FREYA BASCOM CROME on: 04/14/2024 12:12 PM   Modules accepted: Orders

## 2024-04-14 NOTE — Assessment & Plan Note (Signed)
 BP at goal today.

## 2024-04-15 ENCOUNTER — Ambulatory Visit: Payer: Self-pay | Admitting: Family Medicine

## 2024-04-15 LAB — CMP14+EGFR
ALT: 18 IU/L (ref 0–44)
AST: 24 IU/L (ref 0–40)
Albumin: 4.5 g/dL (ref 3.9–4.9)
Alkaline Phosphatase: 81 IU/L (ref 47–123)
BUN/Creatinine Ratio: 16 (ref 10–24)
BUN: 11 mg/dL (ref 8–27)
Bilirubin Total: 0.4 mg/dL (ref 0.0–1.2)
CO2: 23 mmol/L (ref 20–29)
Calcium: 10.2 mg/dL (ref 8.6–10.2)
Chloride: 102 mmol/L (ref 96–106)
Creatinine, Ser: 0.69 mg/dL — ABNORMAL LOW (ref 0.76–1.27)
Globulin, Total: 2.9 g/dL (ref 1.5–4.5)
Glucose: 51 mg/dL — ABNORMAL LOW (ref 70–99)
Potassium: 4.4 mmol/L (ref 3.5–5.2)
Sodium: 139 mmol/L (ref 134–144)
Total Protein: 7.4 g/dL (ref 6.0–8.5)
eGFR: 100 mL/min/1.73

## 2024-04-15 LAB — LIPID PANEL
Chol/HDL Ratio: 4.5 ratio (ref 0.0–5.0)
Cholesterol, Total: 136 mg/dL (ref 100–199)
HDL: 30 mg/dL — ABNORMAL LOW
LDL Chol Calc (NIH): 86 mg/dL (ref 0–99)
Triglycerides: 105 mg/dL (ref 0–149)
VLDL Cholesterol Cal: 20 mg/dL (ref 5–40)

## 2024-04-15 LAB — MICROALBUMIN / CREATININE URINE RATIO
Creatinine, Urine: 56.1 mg/dL
Microalb/Creat Ratio: 359 mg/g{creat} — ABNORMAL HIGH (ref 0–29)
Microalbumin, Urine: 201.2 ug/mL

## 2024-04-15 NOTE — Progress Notes (Signed)
 Kidney function is stable liver enzymes are back to normal which is great.  Total cholesterol and LDL look great.

## 2024-07-13 ENCOUNTER — Ambulatory Visit: Admitting: Family Medicine
